# Patient Record
Sex: Female | Born: 1940 | Race: White | Hispanic: No | Smoking: Former smoker
Health system: Southern US, Community
[De-identification: ages and names within clinical notes are randomized; demographics above are authoritative.]

## PROBLEM LIST (undated history)

## (undated) DIAGNOSIS — K649 Unspecified hemorrhoids: Secondary | ICD-10-CM

## (undated) DIAGNOSIS — E119 Type 2 diabetes mellitus without complications: Secondary | ICD-10-CM

## (undated) DIAGNOSIS — J449 Chronic obstructive pulmonary disease, unspecified: Secondary | ICD-10-CM

## (undated) DIAGNOSIS — K219 Gastro-esophageal reflux disease without esophagitis: Secondary | ICD-10-CM

## (undated) DIAGNOSIS — I5032 Chronic diastolic (congestive) heart failure: Secondary | ICD-10-CM

## (undated) DIAGNOSIS — R531 Weakness: Secondary | ICD-10-CM

## (undated) DIAGNOSIS — K859 Acute pancreatitis without necrosis or infection, unspecified: Secondary | ICD-10-CM

## (undated) DIAGNOSIS — K589 Irritable bowel syndrome without diarrhea: Secondary | ICD-10-CM

## (undated) DIAGNOSIS — K7460 Unspecified cirrhosis of liver: Secondary | ICD-10-CM

## (undated) DIAGNOSIS — I509 Heart failure, unspecified: Secondary | ICD-10-CM

## (undated) DIAGNOSIS — K819 Cholecystitis, unspecified: Secondary | ICD-10-CM

## (undated) DIAGNOSIS — G459 Transient cerebral ischemic attack, unspecified: Secondary | ICD-10-CM

## (undated) DIAGNOSIS — I10 Essential (primary) hypertension: Secondary | ICD-10-CM

## (undated) DIAGNOSIS — H5440 Blindness, one eye, unspecified eye: Secondary | ICD-10-CM

## (undated) HISTORY — DX: Type 2 diabetes mellitus without complications: E11.9

## (undated) HISTORY — DX: Irritable bowel syndrome, unspecified: K58.9

## (undated) HISTORY — DX: Essential (primary) hypertension: I10

## (undated) HISTORY — DX: Blindness, one eye, unspecified eye: H54.40

---
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
## 2002-04-30 ENCOUNTER — Encounter: Payer: Self-pay | Admitting: Internal Medicine

## 2002-05-03 ENCOUNTER — Ambulatory Visit (HOSPITAL_COMMUNITY): Admission: RE | Admit: 2002-05-03 | Discharge: 2002-05-03 | Payer: Self-pay | Admitting: Internal Medicine

## 2002-06-18 ENCOUNTER — Ambulatory Visit: Admission: RE | Admit: 2002-06-18 | Discharge: 2002-06-18 | Payer: Self-pay | Admitting: *Deleted

## 2003-12-03 ENCOUNTER — Ambulatory Visit: Payer: Self-pay | Admitting: Family Medicine

## 2004-02-20 ENCOUNTER — Ambulatory Visit: Payer: Self-pay | Admitting: Family Medicine

## 2004-06-22 ENCOUNTER — Ambulatory Visit: Payer: Self-pay | Admitting: Family Medicine

## 2004-07-30 ENCOUNTER — Ambulatory Visit: Payer: Self-pay | Admitting: Family Medicine

## 2004-10-26 ENCOUNTER — Ambulatory Visit: Payer: Self-pay | Admitting: Family Medicine

## 2005-03-01 ENCOUNTER — Ambulatory Visit: Payer: Self-pay | Admitting: Family Medicine

## 2005-06-30 ENCOUNTER — Ambulatory Visit: Payer: Self-pay | Admitting: Family Medicine

## 2005-11-02 ENCOUNTER — Ambulatory Visit: Payer: Self-pay | Admitting: Family Medicine

## 2006-01-05 ENCOUNTER — Ambulatory Visit: Payer: Self-pay | Admitting: Family Medicine

## 2006-03-30 ENCOUNTER — Ambulatory Visit: Payer: Self-pay | Admitting: Family Medicine

## 2006-05-25 ENCOUNTER — Ambulatory Visit: Payer: Self-pay | Admitting: Family Medicine

## 2007-09-06 ENCOUNTER — Ambulatory Visit: Payer: Self-pay | Admitting: Internal Medicine

## 2007-09-07 ENCOUNTER — Ambulatory Visit (HOSPITAL_COMMUNITY): Admission: RE | Admit: 2007-09-07 | Discharge: 2007-09-07 | Payer: Self-pay | Admitting: Internal Medicine

## 2007-09-07 ENCOUNTER — Ambulatory Visit: Payer: Self-pay | Admitting: Internal Medicine

## 2007-09-07 HISTORY — PX: COLONOSCOPY: SHX174

## 2007-10-10 ENCOUNTER — Ambulatory Visit: Payer: Self-pay | Admitting: Gastroenterology

## 2008-05-03 ENCOUNTER — Encounter: Payer: Self-pay | Admitting: Gastroenterology

## 2008-05-08 DIAGNOSIS — I10 Essential (primary) hypertension: Secondary | ICD-10-CM

## 2008-05-08 DIAGNOSIS — G459 Transient cerebral ischemic attack, unspecified: Secondary | ICD-10-CM

## 2008-05-08 DIAGNOSIS — K623 Rectal prolapse: Secondary | ICD-10-CM

## 2008-05-08 DIAGNOSIS — Z860100 Personal history of colon polyps, unspecified: Secondary | ICD-10-CM | POA: Insufficient documentation

## 2008-05-08 DIAGNOSIS — K208 Other esophagitis without bleeding: Secondary | ICD-10-CM | POA: Insufficient documentation

## 2008-05-08 DIAGNOSIS — K219 Gastro-esophageal reflux disease without esophagitis: Secondary | ICD-10-CM

## 2008-05-08 DIAGNOSIS — Z8673 Personal history of transient ischemic attack (TIA), and cerebral infarction without residual deficits: Secondary | ICD-10-CM | POA: Insufficient documentation

## 2008-05-08 DIAGNOSIS — K648 Other hemorrhoids: Secondary | ICD-10-CM | POA: Insufficient documentation

## 2008-05-08 DIAGNOSIS — R159 Full incontinence of feces: Secondary | ICD-10-CM | POA: Insufficient documentation

## 2008-05-08 DIAGNOSIS — F17200 Nicotine dependence, unspecified, uncomplicated: Secondary | ICD-10-CM

## 2008-05-08 DIAGNOSIS — K644 Residual hemorrhoidal skin tags: Secondary | ICD-10-CM | POA: Insufficient documentation

## 2008-05-08 DIAGNOSIS — K449 Diaphragmatic hernia without obstruction or gangrene: Secondary | ICD-10-CM | POA: Insufficient documentation

## 2008-05-08 HISTORY — DX: Essential (primary) hypertension: I10

## 2008-05-08 HISTORY — DX: Transient cerebral ischemic attack, unspecified: G45.9

## 2008-05-09 ENCOUNTER — Ambulatory Visit: Payer: Self-pay | Admitting: Internal Medicine

## 2008-06-06 ENCOUNTER — Encounter: Payer: Self-pay | Admitting: Gastroenterology

## 2008-07-09 ENCOUNTER — Ambulatory Visit: Payer: Self-pay | Admitting: Internal Medicine

## 2009-07-18 ENCOUNTER — Encounter (INDEPENDENT_AMBULATORY_CARE_PROVIDER_SITE_OTHER): Payer: Self-pay

## 2009-07-24 ENCOUNTER — Ambulatory Visit: Payer: Self-pay | Admitting: Internal Medicine

## 2009-07-24 DIAGNOSIS — R152 Fecal urgency: Secondary | ICD-10-CM | POA: Insufficient documentation

## 2010-01-13 ENCOUNTER — Encounter (INDEPENDENT_AMBULATORY_CARE_PROVIDER_SITE_OTHER): Payer: Self-pay | Admitting: *Deleted

## 2010-02-05 NOTE — Letter (Signed)
Summary: Recall Office Visit  [facility]  [address]   [place], [place] [address]   Phone: [phone]  Fax: [phone]      July 18, 2009   Heather Oconnor [address] [place], [place]  [address] 1940-02-11   Dear Ms. Telford,   According to our records, it is time for you to schedule a follow-up office visit with [place].   At your convenience, please call [phone] to schedule an office visit. If you have any questions, concerns, or feel that this letter is in error, we would appreciate your call.   Sincerely,    Hendricks Limes LPN  [facility] Ph: [phone]   Fax: [phone]

## 2010-02-05 NOTE — Letter (Signed)
Summary: Recall Office Visit  [facility]  [address]   [place], [place] [address]   Phone: [phone]  Fax: [phone]      January 13, 2010   Heather Oconnor [address] [place], [place]  [address] 04-06-1940   Dear Ms. Howlett,   According to our records, it is time for you to schedule a follow-up office visit with [place].   At your convenience, please call [phone] to schedule an office visit. If you have any questions, concerns, or feel that this letter is in error, we would appreciate your call.   Sincerely,    Diana Eves  [facility] Ph: [phone]   Fax: [phone]

## 2010-02-05 NOTE — Assessment & Plan Note (Signed)
Summary: FECAL INCONTINENCE- CDG   Visit Type:  Initial Consult Referring Provider:  Nyland Primary Care Provider:  Nyland  Chief Complaint:  fecal incontinence.  History of Present Illness: 70 year old with intermittent rectal seepage/leakage. Has been on varying doses of cholestyramine as much as 4 g b.i.d. She states she stopped taking it entirely a couple weeks ago; symptoms improved and  fecal seepage is essentially stopped. She has known diverticulosis and hemorrhoids on olonoscopy. She is not passed any blood per rectum. She hasn't had any abdominal pain. She's quite pleased with the way she's doing at this time. She is not taking any form of fiber supplement.   Current Medications (verified): 1)  Lisinopril-Hydrochlorothiazide 20-12.5 Mg Tabs (Lisinopril-Hydrochlorothiazide) .... Two Times A Day 2)  Amlodipine Besylate 10 Mg Tabs (Amlodipine Besylate) .... Once Daily 3)  Plavix 75 Mg Tabs (Clopidogrel Bisulfate) .... Once Daily 4)  Metoprolol Tartrate 25 Mg Tabs (Metoprolol Tartrate) .... Once Daily 5)  Simvastatin 80 Mg Tabs (Simvastatin) .... Once Daily 6)  Aspirin 81 Mg Tabs (Aspirin) .... Once Daily  Allergies (verified): No Known Drug Allergies  Past History:  Past Surgical History: Last updated: 2008/05/14 none  Family History: Last updated: 05/14/08 Father:deceased 56-??  Mother:living 83-htn, breathing problems  Siblings: 4 brother 1 sister  Social History: Last updated: 05/14/08 Marital Status: Married Children: 3 Occupation: no Patient is a former smoker.  Alcohol Use - no  Risk Factors: Smoking Status: quit (14-May-2008)  Past Medical History: Hypertension  Vital Signs:  Patient profile:   70 year old female Height:      65 inches Weight:      190 pounds BMI:     31.73 Temp:     98.6 degrees F oral Pulse rate:   72 / minute BP sitting:   130 / 80  (left arm) Cuff size:   large  Vitals Entered By: Cloria Spring LPN (July 24, 2009 3:49  PM)  Physical Exam  General:  alert conversant pleasant lady in no acute distress  Detail exam deferred  Impression & Recommendations: Impression: Pleasant 70 year old lady with intermittent fecal seepage/leakage in a setting of rectal prolapse. Now much improved; off cholestyramine some 2 weeks ago. She has known diverticulosis and  not taking a fiber supplement.  Recommendations: I would favor a low dose fiber supplement in the way of psyllium supplements such as Metamucil (ie one half the dose daily for 2 weeks been increasing to one dose daily. She was admonished about the bloating symptoms early on in therapy. Will hold off on re- instituting cholestyramine at  this time.  We'll plan to see this nice lady back in 6 months. She has any relapse in her symptoms in the interim, she is to let me know.  Appended Document: Orders Update    Clinical Lists Changes  Problems: Added new problem of FECAL URGENCY (OZH-086.57) Orders: Added new Service order of Est. Patient Level II [id number]) - Signed      Appended Document: FECAL INCONTINENCE- CDG REMINDER IN COMPUTER

## 2010-05-19 NOTE — Op Note (Signed)
NAME:  Heather Oconnor, Heather Oconnor            ACCOUNT NO.:  [id number]   MEDICAL RECORD NO.:  [id number]          PATIENT TYPE:  AMB   LOCATION:  DAY                           FACILITY:  [facility]   PHYSICIAN:  R. Roetta Sessions, M.D. DATE OF BIRTH:  1940-05-28   DATE OF PROCEDURE:  DATE OF DISCHARGE:                               OPERATIVE REPORT   INDICATIONS FOR PROCEDURE:  A 70 year old lady with history of fecal  seepage daily and incontinence of seepage.  She denies diarrhea.  She  does have lax sphincter tone.  She has had some recent hematochezia.  Distant history of adenomatous polyps.  Colonoscopy is now being done.  Risks, benefits, alternatives, and limitations have been reviewed,  questions answered, and she is agreeable.  She has not taken the  [facility] Forte prescribed at our office as of yet.   PROCEDURE NOTE:  O2 saturation, blood pressure, pulse, and respirations  were monitored throughout the entire procedure.   CONSCIOUS SEDATION:  Versed 5 mg IV and Demerol 125 mg IV in divided  doses.   INSTRUMENT:  Pentax video chip system.   FINDINGS:  Digital rectal exam revealed external hemorrhoids and  sphincter tone.  She had some endoscopic findings.  Prep was adequate.  Colon:  Colonic mucosa was surveyed from the rectosigmoid junction  through the left transverse right colon to the appendiceal orifice,  ileocecal valve, and cecum.  These structures are well seen and  photographed for the record.  From this level, scope was slowly and  cautiously withdrawn.  All previously mentioned mucosal surfaces were  again seen.  The patient had a long redundant colon requiring changing  of the patient's position, external abdominal pressure, and cecum.  The  patient has scattered left-sided diverticula.  Colonic mucosa appeared  normal.  Scope was pulled down the rectum.  I attempted to retroflex,  but was unable to do so with __________, I was able to see the rectal  mucosa on phos well.   Rectal vault was relatively small.  Rectal mucosa  appeared normal aside from the anal canal and external hemorrhoids.  The  patient tolerated the procedure well and was reactive in Endoscopy.   IMPRESSION:  Anal canal/external hemorrhoids.  Long redundant colon and  left-sided diverticula.  The remainder of colonic mucosa appeared  normal.   RECOMMENDATIONS:  Go ahead and take the  [facility] Forte kit as  prescribed previously.  Daily Metamucil or Citrucel fiber supplements.  We will also add 2 g of Questran or cholestyramine to be taken not  within 2 hours of other medications daily.  Discussed the off label use  of this.  I will plan to see this nice lady back in the office in one  month to assess her progress.      Jonathon Bellows, M.D.  Electronically Signed     RMR/MEDQ  D:  09/07/2007  T:  09/08/2007  Job:  [id number]

## 2010-05-19 NOTE — Assessment & Plan Note (Signed)
NAMEMarland Kitchen  Heather Oconnor, Heather Oconnor             CHART#:  [id number]   DATE:  10/10/2007                       DOB:  January 20, 1940   CHIEF COMPLAINT:  Followup colonoscopy.   PROBLEM LIST:  1. Fecal seepage.  2. Rectal prolapse.  3. History of adenomatous polyps.  4. Colonoscopy by Dr. Jena Gauss for hematochezia and fecal seepage on      September 07, 2007, which showed internal or external and anal canal      hemorrhoids along with redundant colon left-sided diverticula.   SUBJECTIVE:  The patient is a 70 year old Caucasian female, who is here  for followup after her recent procedure.  She has done very well.  She  has used AnaMantle [facility] Forte b.i.d.  She occasionally has some  proctalgia.  She notes that her seepage is much better, now she is on  Prevalite 2 g daily.  She is taking this every day.  She denies any  rectal bleeding or melena.  Denies any constipation or diarrhea.  Denies  any abdominal pain.  Her stools are still somewhat loose.   CURRENT MEDICATIONS:  See the list from October 10, 2007.   ALLERGIES:  No known drug allergies are noted.   OBJECTIVE:  VITAL SIGNS:  Weight 182 pounds, height 65 inches,  temperature 97.9, blood pressure 132/80, and pulse 60.  GENERAL:  She is an obese Caucasian female, who is alert, oriented,  pleasant, and cooperative, in no acute distress.  HEENT:  Sclerae clear, nonicteric.  Conjunctivae pink.  Oropharynx pink  and moist without any lesions.  CHEST:  Heart, regular rate and rhythm.  Normal S1 and S2.  ABDOMEN:  Protuberant with positive bowel sounds x4.  No bruits  auscultated.  Soft, nontender, nondistended without palpable mass or  hepatosplenomegaly.  There is no rebound, tenderness, or guarding.   ASSESSMENT:  The patient is a 69 year old female with fecal seepage  which has responded to Prevalite 2 g daily.  She also has hemorrhoids  and we will attempt to treat them medically.  Again, if she has no  relief, she may need surgical evaluation.   She has diverticulosis and  she has history of adenomatous colon polyps.   PLAN:  1. Prevalite every day 2 g as needed for diarrhea or seepage, do not      take until 2 hours before or after your other medications.  2. Begin a probiotic of choice.  I have given her some names to choose      from including Sustenex, Align, FedEx, or Flora-Q.  3. Anusol HC suppositories one per rectum b.i.d. #20 with no refills.  4. If she is no better, she is going to give [place] a call, otherwise we      will see her back in 6 months.       Lorenza Burton, N.P.  Electronically Signed     R. Roetta Sessions, M.D.  Electronically Signed    KJ/MEDQ  D:  10/11/2007  T:  10/11/2007  Job:  [id number]   cc:   Delaney Meigs, M.D.

## 2010-05-19 NOTE — H&P (Signed)
NAME:  LAURISA, SAHAKIAN            ACCOUNT NO.:  [id number]   MEDICAL RECORD NO.:  [id number]          PATIENT TYPE:  AMB   LOCATION:  DAY                           FACILITY:  [facility]   PHYSICIAN:  R. Roetta Sessions, M.D. DATE OF BIRTH:  12-20-1940   DATE OF ADMISSION:  DATE OF DISCHARGE:  LH                              HISTORY & PHYSICAL   PRIMARY CARE PHYSICIAN:  Delaney Meigs, M.D.   REASON FOR CONSULTATION:  Fecal incontinence and hematochezia.   HISTORY OF PRESENT ILLNESS:  Ms. Armentrout is a 70 year old Caucasian  female.  She has history of a single adenomatous polyp and fecal  seepage.  She tells me that now she has been having fecal incontinence  all of the time.  She tells me she feels as though she cannot go  anywhere.  She denies any proctalgia or abdominal pain.  She tells me at  times she is not even aware that she has passed stool.  She does have  approximately one loose stool daily but then continues to have seepage  throughout the day.  She denies any abdominal pain.  She does note an  aggravating discomfort to her rectum without severe pain.  She has  noticed occasional small volume bright red blood with wiping on the  toilet paper.  She denies any history of constipation.  Her weight has  remained stable.  She denies any anorexia.  She occasionally has  heartburn/indigestion for which she takes Tums.  She denies any  dysphagia, odynophagia, nausea, vomiting.   PAST MEDICAL-SURGICAL HISTORY:  Hypertension, TIAs, GERD.  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  She was dilated with a 56 French Maloney dilator and she also  had nonerosive gastritis and Helicobacter pylori serology was negative  on last EGD May 03, 2002.  She had a history of diverticulosis in the  sigmoid colon and a single adenomatous polyp in the sigmoid which was  removed and was found to be a tubular adenoma as well as small external  hemorrhoids on last colonoscopy by Dr. Karilyn Cota May 03, 2002.   CURRENT MEDICATIONS:  1. Lisinopril 20/12.5 mg b.i.d.  2. Amlodipine 10 mg daily.  3. Plavix 75 mg daily.  4. Metoprolol 25 mg daily.  5. Simvastatin 80 mg daily.  6. Aspirin 81 mg daily.   ALLERGIES:  No known drug allergies.   FAMILY HISTORY:  There is no known family history of abnormal liver or  chronic GI problems.  Mother age 48 has hypertension.  Father deceased  at 11 secondary to CVA.  She has one sister with history of hypertension  and diabetes mellitus, four brothers with similar history.   SOCIAL HISTORY:  Ms. Ure has been married for 26 years.  She has  three grown healthy children.  She is retired.  She has a 50 pack year  history of tobacco use, quit about 2 years ago.  Denies any alcohol or  drug use.   REVIEW OF SYSTEMS:  See HPI, otherwise negative.   PHYSICAL EXAMINATION:  VITAL  SIGNS:  Weight 180 pounds, height 65  inches, temperature 98.2, blood pressure 160/80, pulse 64.  GENERAL:  Ms. Brozowski is a well-developed, well-nourished Caucasian  female in no acute distress.  She does appear somewhat anxious.  HEENT:  Sclerae anicteric.  Conjunctivae pink.  Oropharynx pink, moist  without any lesions.  NECK:  Supple without thyromegaly.  CHEST:  Heart regular rhythm, normal S1, S2.  No murmurs, clicks, rubs  or gallops.  LUNGS:  Clear to auscultation bilaterally.  ABDOMEN:  Protuberant with positive bowel sounds x4.  No bruits  auscultated.  Soft, nontender, nondistended without palpable mass, bruit  or organomegaly.  No rebound, tenderness or guarding.  RECTAL:  She does have mild rectal prolapse which is easily reducible.  She does have visible external hemorrhoids and poor sphincter tone.  There are no internal masses palpated.  EXTREMITIES:  Without clubbing or edema.   IMPRESSION:  Ms. Krizek is a 70 year old Caucasian female.  She has a  history of fecal seepage and now with daily fecal  incontinence/seepage.  She does have poor sphincter tone as well as mild, what appears to be a  mild rectal prolapse.  She also is noticing intermittent hematochezia.  Given her history of adenomatous polyps and her recent hematochezia, I  do feel she should undergo a complete colonoscopy as hers was over 5  years ago to rule out colorectal carcinoma.  It could be rectal prolapse  which is causing her to have this seepage as well as combined with poor  sphincter tone for her age.  She may need to be sent to a surgeon.   PLAN:  1. AnaMantle HC Forte one kit b.i.d. #20 kits no refills.  2. Colonoscopy with Dr. Jena Gauss in the near future.  Discussed the      procedure including the risks and benefits, which include but were      not limited to bleeding, infection, perforation, drug reaction,      informed consent will be obtained.      Lorenza Burton, N.P.      Jonathon Bellows, M.D.  Electronically Signed    KJ/MEDQ  D:  09/06/2007  T:  09/06/2007  Job:  [id number]   cc:   Delaney Meigs, M.D.  Fax: [phone]

## 2010-05-22 NOTE — Consult Note (Signed)
Heather Oconnor, Heather Oconnor                        ACCOUNT NO.:  [id number]   MEDICAL RECORD NO.:  [id number]                  PATIENT TYPE:   LOCATION:                                       FACILITY:   PHYSICIAN:  Lionel December, M.D.                 DATE OF BIRTH:  27-Jul-1940   DATE OF CONSULTATION:  04/17/2002  DATE OF DISCHARGE:                                   CONSULTATION   PHYSICIAN REQUESTING CONSULTATION:  Colon Flattery, M.D.   REASON FOR CONSULTATION:  Fecal incontinence.   HISTORY OF PRESENT ILLNESS:  The patient is a 70 year old Caucasian female  patient of Dr. Dewaine Conger who presents today for further evaluation of fecal  incontinence.  She says over the last six months she has had intermittent  episodes of fecal incontinence.  These always occur while she is at work.  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  She occasionally does some lifting.  She will go to use the  restroom and only then note that she has had a couple of tablespoons of  liquidy stool in her underwear.  Otherwise, she states her bowels move  generally once every day to every other day.  Her stool is usually soft and  formed unless she has one of these episodes of incontinence.  Denies any  melena or rectal bleeding.  She states that she has had trouble with  hemorrhoids.  They tend to swell and become painful.  Denies any abdominal  pain, nausea or vomiting, heartburn, weight loss.  She has never had a  colonoscopy.   CURRENT MEDICATIONS:  1. Lisinopril 10 mg daily.  2. Norvasc 10 mg daily.  3. Hydrochlorothiazide 25 mg 1/2 tablet daily.  4. Plavix 75 mg daily.  5. Zocor 20 mg daily.  6. Aspirin 81 mg daily.   ALLERGIES:  No known drug allergies.   PAST MEDICAL HISTORY:  1. Hypertension.  2. 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  3. Hypercholesterolemia.   PAST SURGICAL HISTORY:  None.   FAMILY HISTORY:  Negative for chronic GI illnesses or  colorectal cancer.   SOCIAL HISTORY:  She has been married for 20 years and has three children.  She is employed with [employer].  She smokes one pack of cigarettes  daily.  She has smoked since age 70.  Denies any alcohol use.   REVIEW OF SYSTEMS:  Please see HPI for GI.  GENERAL:  Denies any weight  loss.  CARDIOPULMONARY:  Denies any chest pain or shortness of breath.   PHYSICAL EXAMINATION:  VITAL SIGNS:  Height 5 feet 4 inches, weight 173  pounds, temperature 98.3 degrees, blood pressure 170/100, pulse 72.  GENERAL:  A pleasant, well-nourished, well-developed Caucasian female in no  acute distress.  SKIN:  Warm and dry.  No jaundice.  HEENT:  Conjunctivae are pink.  Sclerae  are nonicteric.  Oropharyngeal  mucosa moist and pink.  No lesions, erythema, or exudate.  No  lymphadenopathy or thyromegaly.  CHEST:  Lungs are clear to auscultation.  CARDIAC:  Regular rate and rhythm.  Normal S1, S2.  No murmurs, rubs, or  gallops.  ABDOMEN:  Positive bowel sounds.  Soft, nontender, nondistended.  No  organomegaly or masses  RECTAL:  Examination reveals a cluster of large hemorrhoids externally.  There is some excoriation.  The patient was examined by Dr. Karilyn Cota as well.  On internal exam, sphincter tone appears to be adequate.  With having the  patient bear down, there is no evidence of rectal prolapse.  No masses in  the rectal vault.  Stool is Hemoccult positive.  EXTREMITIES:  No edema.   LABORATORY DATA:  April 02, 2002:  BUN 8, creatinine 0.7, glucose 91, sodium  140, potassium 4, albumin 4.4, total bilirubin 0.6, alkaline phosphatase 66,  SGOT 15, SGPT 14.   IMPRESSION:  The patient is a 70 year old lady who has a six-month history  of intermittent fecal incontinence.  Tends to occur with standing for  prolonged periods of time.  Sphincter tone is adequate on examination.  I  suspect her symptoms are due to the several very large hemorrhoids.  I  suspect that she does have  some drainage due to these hemorrhoids.  On  examination, stool is Hemoccult positive.  This may be due to the excoriated  hemorrhoid noted on examination; however, the patient has never had a  colonoscopy and I do recommend one at this time to make sure that she has no  other abnormalities up stream.   The patient is hypertensive today.  She is quite anxious.   PLAN:  1. Colonoscopy in the near future.  2. FiberChoice two tablets daily.  3. Analpram-HC 2.5% cream apply anorectally b.i.d. for two weeks.     Prescription was provided for a two-week supply.  4. Further recommendations to follow.  5. I have asked the patient to monitor her blood pressures closely.  She can     have this checked at a pharmacy or [facility] if she doesn't have a blood     pressure cuff available to her.  If they remain elevated, she should     follow     up with Dr. Dewaine Conger.  Please note her blood pressure was 182/98 when she     saw Dr. Dewaine Conger a couple of weeks ago.   I would like to thank Dr. Dewaine Conger for allowing [place] to take part in the care of  this patient.     Tana Coast, P.A.                        Lionel December, M.D.    LL/MEDQ  D:  04/17/2002  T:  04/17/2002  Job:  [id number]   cc:   Colon Flattery, MD  [address]  [place]  [place] [address]  Fax: [phone]

## 2010-05-22 NOTE — Op Note (Signed)
NAME:  Heather Oconnor, Heather Oconnor                      ACCOUNT NO.:  [id number]   MEDICAL RECORD NO.:  [id number]                   PATIENT TYPE:  AMB   LOCATION:  DAY                                  FACILITY:  [facility]   PHYSICIAN:  Lionel December, M.D.                 DATE OF BIRTH:  12-30-1940   DATE OF PROCEDURE:  05/03/2002  DATE OF DISCHARGE:                                 OPERATIVE REPORT   PROCEDURE:  Esophagogastroduodenoscopy with esophageal dilatation followed  by total colonoscopy.   ENDOSCOPIST:  Lionel December, M.D.   INDICATIONS:  Ms. Heather Oconnor is a 70 year old Caucasian female with intermittent  solid food dysphagia who also has what she describes to be fecal  incontinence.  She notices liquid stool on her underpants.  Her rectal tone,  when she was seen in the office, was normal.  She does have central skin  tags. She is undergoing EGD and colonoscopy both for diagnostic purposes.  The procedure and risks were reviewed with the patient and informed consent  was obtained.   PREOPERATIVE MEDICATIONS:  Cetacaine spray for oropharyngeal topical  anesthesia, Demerol 50 mg IV and Versed 5 mg and Atropine 0.3 mg IV for  asymptomatic bradycardia.   INSTRUMENT:  Olympus video system.   EGD FINDINGS:  Procedure performed in endoscopy suite.  The patient's vital  signs and O2 saturation were monitored during the procedure and remained  stable.  The patient was placed in the left lateral recumbent position and  endoscope was passed via the oropharynx without any difficulty into the  esophagus.   ESOPHAGUS:  Mucosa of the esophagus was normal.  She had a singular erosion  at the GE junction.  The gastric mucosa around it was swollen.  There was a  small sliding hiatal hernia.  There was no ring or stricture that was  apparent.   STOMACH:  It was empty and distended very well with insufflation.  The folds  of the proximal stomach were normal.  Examination of the mucosa revealed  erythema and granularity at the antrum.  The angularis, fundus, and cardia  were normal.  Pyloric channel was patent.   DUODENUM:  Examination of the bulb and postbulbar duodenum was normal.  The  endoscope was withdrawn.   Esophageal dilation was performed by passing a 56 [place] Maloney dilator.  The esophagus was re-examined post dilatation.  There was no mucosal injury.  The endoscope was withdrawn and the patient was prepared for procedure #2.   COLONOSCOPY:  Rectal examination was performed.  Soft, central skin tags  were noted.  A digital exam was normal.   The scope was placed in the rectum and advanced under vision into the  sigmoid colon.  Preparation was satisfactory.  The scope was passed to the  cecum which was identified by ileocecal valve and appendiceal orifice.  Pictures were taken for the record.  As the scope was withdrawn  the colonic  mucosa was carefully examined.  There was a single small polyp at the  midsigmoid colon which was ablated by cold biopsy.  There were a couple  small diverticula in this area.  The rectal mucosa was normal.   The scope was retroflexed to examine the anorectal junction and hemorrhoids  were noted below the dentate line.  The endoscope was straightened and  withdrawn.  The patient tolerated the procedure well.   FINAL DIAGNOSES:  1. Erosive reflux esophagitis with small sliding hiatal hernia with no     evidence of ring or stricture formation.  Esophagogastroduodenoscopy     performed by passing 56 [place] Maloney dilator.  2. Nonerosive antral gastritis possibly related to ASA use, but will make     sure she does not have Helicobacter pylori infection.  3. A small polyp ablated by cold biopsy from the sigmoid colon.  4. A small diverticula at sigmoid colon.  5. External hemorrhoids.   RECOMMENDATIONS:  1. Antireflux measures.  2. Prilosec 20 mg p.o. q.a.m. (OTC).  3. Citrucel 1 tablespoonful daily at night.  4. H. pylori serology  will be checked today.  I will contact patient with     results of blood test and biopsy.  I have asked her to keep a record of     these episodes of fecal seepage.  5. She will return for OV 6 weeks from now.                                               Lionel December, M.D.    NR/MEDQ  D:  05/03/2002  T:  05/03/2002  Job:  [id number]   cc:   Colon Flattery, MD  [address]  [place]  [place] [address]  Fax: [phone]

## 2013-09-11 DIAGNOSIS — E785 Hyperlipidemia, unspecified: Secondary | ICD-10-CM | POA: Insufficient documentation

## 2013-09-11 DIAGNOSIS — E782 Mixed hyperlipidemia: Secondary | ICD-10-CM | POA: Insufficient documentation

## 2013-09-11 DIAGNOSIS — E1169 Type 2 diabetes mellitus with other specified complication: Secondary | ICD-10-CM | POA: Insufficient documentation

## 2014-11-13 ENCOUNTER — Encounter: Payer: Self-pay | Admitting: Internal Medicine

## 2014-11-27 ENCOUNTER — Ambulatory Visit (INDEPENDENT_AMBULATORY_CARE_PROVIDER_SITE_OTHER): Payer: Medicare HMO | Admitting: Nurse Practitioner

## 2014-11-27 ENCOUNTER — Encounter: Payer: Self-pay | Admitting: Nurse Practitioner

## 2014-11-27 VITALS — BP 106/72 | HR 80 | Temp 97.4°F | Ht 64.0 in | Wt 182.8 lb

## 2014-11-27 DIAGNOSIS — R1084 Generalized abdominal pain: Secondary | ICD-10-CM

## 2014-11-27 DIAGNOSIS — R109 Unspecified abdominal pain: Secondary | ICD-10-CM | POA: Insufficient documentation

## 2014-11-27 DIAGNOSIS — R197 Diarrhea, unspecified: Secondary | ICD-10-CM

## 2014-11-27 NOTE — Assessment & Plan Note (Signed)
Diarrhea and abdominal pain in a pattern consistent with IBS-D. Viberzi and follow-up as per above. Also with skin irritation and redness due to frequent stools and needing to wear a pad. Recommend OTC hydrocortisone prn. Follow-up in 6-8 weeks.

## 2014-11-27 NOTE — Assessment & Plan Note (Signed)
Patient with abdominal pain and diarrhea with abdominal tenderness resolving after bowel movement. She tends to have urgency and bowel movement after each meal. Has eliminated dairy from her diet with no improvement. 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 We will trial her on Viberzi 100 mg twice daily with food. She does not drink any alcohol, gallbladder remains in situ. Discussed side effects including to notify us if any severe abdominal pain. She will call some 2 weeks with the results of the medication. Return for follow-up in 6-8 weeks.

## 2014-11-27 NOTE — Patient Instructions (Signed)
1. Start taking Viberzi 100 mg. Take it twice a day with food. Exline call us if you have any severe abdominal pain. 2. Do not drink any more and 2 drinks a day of alcohol all taking this medication. 3. Call in 2 weeks and let me know how is working for you. If is working well and consented a prescription. 4. Return for follow-up in 6-8 weeks. 5. He can take over-the-counter hydrocortisone cream for the irritation we discussed.

## 2014-11-27 NOTE — Progress Notes (Signed)
Primary Care Physician:  Josue Hector, MD Primary Gastroenterologist:  Dr. Jena Gauss   Chief Complaint  Patient presents with  . Diarrhea  . Abdominal Pain    HPI:   74 year old female presents for evaluation of diarrhea and abdominal pain. Last seen by our office in 2009. PCP notes reviewed. Per PCP notes at last visit 11/05/2014 patient was having several weeks of diarrhea with no fever or chills, GERD after every meal, denies a lot of dairy. Was recommended to eliminate dairy from her foods, use Imodium hour before eating, and follow-up with [place]. EGD and colonoscopy last completed 09/07/2007 which found anal canal/external hemorrhoids, redundant colon, left-sided diverticula, otherwise normal colonic mucosa.  Today she states she has diarrhea postprandially and with urgency, stools are loose. States it's been this way "for years" but has gotten worse as of 1 year ago. Wears pads. Previously was intermittent, now is with every meal. Also with abdominal pain which is relieved with bowel movement. Has 1-3 daily bowel movements, thinks she doesn't finish because she will have recurrent stools soon after. Denies hematochezia, fever, chills, unintentional weight loss. Denies chest pain, dyspnea, dizziness, lightheadedness, syncope, near syncope. Denies any other upper or lower GI symptoms.  No past medical history on file.  No past surgical history on file.  Current Outpatient Prescriptions  Medication Sig Dispense Refill  . amLODipine (NORVASC) 10 MG tablet TAKE 1 TABLET EVERY DAY    . aspirin 81 MG chewable tablet Chew 81 mg by mouth.    . canagliflozin (INVOKANA) 300 MG TABS tablet TAKE ONE (1) TABLET EACH DAY    . clopidogrel (PLAVIX) 75 MG tablet TAKE 1 TABLET EVERY DAY    . lisinopril-hydrochlorothiazide (PRINZIDE,ZESTORETIC) 20-12.5 MG tablet TAKE 2 TABLETS EVERY DAY    . metoprolol tartrate (LOPRESSOR) 25 MG tablet TAKE 1 TABLET EVERY DAY    . glipiZIDE (GLUCOTROL XL) 2.5 MG 24  hr tablet Take 2.5 mg by mouth daily with breakfast.    . meclizine (ANTIVERT) 25 MG tablet Take 25 mg by mouth.     No current facility-administered medications for this visit.    Allergies as of 11/27/2014  . (No Known Allergies)    No family history on file.  Social History   Social History  . Marital Status: Married    Spouse Name: N/A  . Number of Children: N/A  . Years of Education: N/A   Occupational History  . Not on file.   Social History Main Topics  . Smoking status: Former Smoker -- 9 years    Quit date: 11/27/2006  . Smokeless tobacco: Not on file  . Alcohol Use: Not on file  . Drug Use: Not on file  . Sexual Activity: Not on file   Other Topics Concern  . Not on file   Social History Narrative  . No narrative on file    Review of Systems: General: Negative for anorexia, weight loss, fever, chills, fatigue, weakness. Eyes: Negative for vision changes.  ENT: Negative for hoarseness, difficulty swallowing. CV: Negative for chest pain, angina, palpitations, peripheral edema.  Respiratory: Negative for dyspnea at rest, cough, sputum, wheezing.  GI: See history of present illness. Derm: Negative for rash or itching.  Endo: Negative for unusual weight change.  Heme: Negative for bruising or bleeding. Allergy: Negative for rash or hives.    Physical Exam: BP 106/72 mmHg  Pulse 80  Temp(Src) 97.4 F (36.3 C)  Ht  (1.626 m)  Wt 182 lb  12.8 oz (82.918 kg)  BMI 31.36 kg/m2 General:   Alert and oriented. Pleasant and cooperative. Well-nourished and well-developed.  Head:  Normocephalic and atraumatic. Eyes:  Without icterus, sclera clear and conjunctiva pink.  Ears:  Normal auditory acuity. Cardiovascular:  S1, S2 present without murmurs appreciated. Extremities without clubbing or edema. Respiratory:  Clear to auscultation bilaterally. No wheezes, rales, or rhonchi. No distress.  Gastrointestinal:  +BS, soft, and non-distended. Minimal TTP  generalized abdomen.  No HSM noted. No guarding or rebound. No masses appreciated.  Rectal:  Deferred  Skin:  Intact without significant lesions or rashes. Neurologic:  Alert and oriented x4;  grossly normal neurologically. Psych:  Alert and cooperative. Normal mood and affect. Heme/Lymph/Immune: No excessive bruising noted.    11/27/2014 10:33 AM

## 2014-12-02 ENCOUNTER — Telehealth: Payer: Self-pay | Admitting: Internal Medicine

## 2014-12-02 NOTE — Telephone Encounter (Signed)
Spoke with the pt- she has not had a bm in about 3-4 days. Her last bm was just a very small amount.  She said the medication was working too well and she went from one extreme to the other. Small amount of cramping in her abd. No fever, no blood, no vomiting. She does have some nausea. She wants to know if she can take it once a day instead of twice to see if that helps.

## 2014-12-02 NOTE — Progress Notes (Signed)
cc'ed to pcp °

## 2014-12-02 NOTE — Telephone Encounter (Signed)
Pt was seen recently by EG and called this morning to say that since she started taking viberzi she has not had a BM since. Please call [phone]

## 2014-12-03 ENCOUNTER — Telehealth: Payer: Self-pay | Admitting: Internal Medicine

## 2014-12-03 NOTE — Telephone Encounter (Signed)
Patient called to say that she has called several times and no one has gotten back with her about her medicine. I told her that JL had spoke with her yesterday and she was waiting to hear back from EG. EG has been seeing patients this morning and when he gets back with JL she would call her back. Patient is wanting to know something today. Please call patient back

## 2014-12-03 NOTE — Telephone Encounter (Signed)
Yes, take it once a day and see fi that improves her symptoms. It may have been too much at the bid dosing. Call us in about a week and let us know how she's doing.

## 2014-12-03 NOTE — Telephone Encounter (Signed)
Routing to EG 

## 2014-12-03 NOTE — Telephone Encounter (Signed)
Pt is aware.  

## 2015-01-10 ENCOUNTER — Telehealth: Payer: Self-pay | Admitting: Internal Medicine

## 2015-01-10 MED ORDER — ELUXADOLINE 100 MG PO TABS: 100.0000 mg | ORAL_TABLET | Freq: Two times a day (BID) | ORAL | Status: DC

## 2015-01-10 NOTE — Telephone Encounter (Signed)
RX done. 

## 2015-01-10 NOTE — Telephone Encounter (Signed)
[phone] VIBERZI SAMPLES ERIC GAVE HER ARE WORKING AND SHE WANTS THE 100'S CALLED INTO [facility]  GOING TO TAKE TWICE A DAY

## 2015-01-10 NOTE — Addendum Note (Signed)
Addended by: Tiffany KocherLEWIS, Lashauna Arpin S on: 01/10/2015 11:12 AM   Modules accepted: Orders

## 2015-01-10 NOTE — Telephone Encounter (Signed)
Routing to the refill box. 

## 2015-01-13 ENCOUNTER — Telehealth: Payer: Self-pay | Admitting: Internal Medicine

## 2015-01-13 NOTE — Telephone Encounter (Signed)
Pt is out of her Viberzi and was told by pharmacy that they need PA before they can fill it. Do we have anything from her pharmacy about this and do we have samples to hold her until she can get prescription filled. She cancelled her OV with [facility] for tomorrow due to weather. Please call [phone]

## 2015-01-13 NOTE — Telephone Encounter (Signed)
I will put the sample up front for her. Told her that Raynelle FanningJulie was out today.

## 2015-01-14 ENCOUNTER — Ambulatory Visit: Payer: Medicare HMO | Admitting: Nurse Practitioner

## 2015-01-14 NOTE — Telephone Encounter (Signed)
Working on PA

## 2015-01-15 NOTE — Telephone Encounter (Signed)
PA has been sent to the insurance company. 

## 2015-01-27 ENCOUNTER — Ambulatory Visit: Payer: Medicare HMO | Admitting: Nurse Practitioner

## 2015-02-04 ENCOUNTER — Ambulatory Visit (INDEPENDENT_AMBULATORY_CARE_PROVIDER_SITE_OTHER): Payer: Medicare HMO | Admitting: Nurse Practitioner

## 2015-02-04 ENCOUNTER — Encounter: Payer: Self-pay | Admitting: Nurse Practitioner

## 2015-02-04 VITALS — BP 131/69 | HR 64 | Temp 97.5°F | Ht 64.0 in | Wt 181.4 lb

## 2015-02-04 DIAGNOSIS — K589 Irritable bowel syndrome without diarrhea: Secondary | ICD-10-CM | POA: Insufficient documentation

## 2015-02-04 DIAGNOSIS — K580 Irritable bowel syndrome with diarrhea: Secondary | ICD-10-CM

## 2015-02-04 MED ORDER — DICYCLOMINE HCL 10 MG PO CAPS: 10.0000 mg | ORAL_CAPSULE | Freq: Three times a day (TID) | ORAL | Status: DC | PRN

## 2015-02-04 NOTE — Patient Instructions (Signed)
1. I send in a prescription for Bentyl to your pharmacy. 2. Take it once a day in the morning before breakfast. You can add a dose before lunch and before dinner as needed to prevent your symptoms. 3. Call in a few weeks and let us know if it is working. 4. Return for follow-up in 3 months.

## 2015-02-04 NOTE — Progress Notes (Signed)
Referring Provider: Dione Housekeeper, MD Primary Care Physician:  Sherrie Mustache, MD Primary GI:  Dr. Gala Romney  Chief Complaint  Patient presents with  . Abdominal Pain  . Follow-up    HPI:   75 year old female presents for follow-up on abdominal pain and diarrhea. Last seen in our office 11/27/2014 with abdominal pain relieved by bowel movement, urgency after every meal. Eliminated dairy from her diet with no improvement. Diagnosed with irritable bowel diarrhea type because she met) criteria. She is trialed on Viberzi 100 mg twice a day. Colonoscopy up-to-date last completed 09/07/2007.  Today she states she became constipated on Viberzi twice daily, cut back to one and it was effective with an occasional breakthrough. Leakage stopped as well. Abdominal pain improved on Viberzi as well. However, Viberzi will cost $500/month and copay card not applicable to Medicare. Denies N/V, hematochezia, melena, unintentional weight loss. Denies chest pain, dyspnea, dizziness, lightheadedness, syncope, near syncope. Denies any other upper or lower GI symptoms.   Past Medical History  Diagnosis Date  . IBS (irritable bowel syndrome)     No past surgical history on file.  Current Outpatient Prescriptions  Medication Sig Dispense Refill  . amLODipine (NORVASC) 10 MG tablet TAKE 1 TABLET EVERY DAY    . aspirin 81 MG chewable tablet Chew 81 mg by mouth.    . canagliflozin (INVOKANA) 300 MG TABS tablet TAKE ONE (1) TABLET EACH DAY    . clopidogrel (PLAVIX) 75 MG tablet TAKE 1 TABLET EVERY DAY    . Eluxadoline (VIBERZI) 100 MG TABS Take 100 mg by mouth 2 (two) times daily with a meal. 60 tablet 5  . lisinopril-hydrochlorothiazide (PRINZIDE,ZESTORETIC) 20-12.5 MG tablet TAKE 2 TABLETS EVERY DAY    . metoprolol tartrate (LOPRESSOR) 25 MG tablet TAKE 1 TABLET EVERY DAY    . dicyclomine (BENTYL) 10 MG capsule Take 1 capsule (10 mg total) by mouth 3 (three) times daily with meals as needed for  spasms. 30 capsule 1   No current facility-administered medications for this visit.    Allergies as of 02/04/2015  . (No Known Allergies)    Family History  Problem Relation Age of Onset  . Colon cancer Neg Hx     Social History   Social History  . Marital Status: Married    Spouse Name: N/A  . Number of Children: N/A  . Years of Education: N/A   Social History Main Topics  . Smoking status: Former Smoker -- 9 years    Quit date: 11/27/2006  . Smokeless tobacco: Never Used  . Alcohol Use: No  . Drug Use: No  . Sexual Activity: Not Asked   Other Topics Concern  . None   Social History Narrative    Review of Systems: General: Negative for anorexia, weight loss, fever, chills, fatigue, weakness. ENT: Negative for hoarseness, difficulty swallowing. CV: Negative for chest pain, angina, palpitations, peripheral edema.  Respiratory: Negative for dyspnea at rest, cough, sputum, wheezing.  GI: See history of present illness. Endo: Negative for unusual weight change.  Heme: Negative for bruising or bleeding.   Physical Exam: BP 131/69 mmHg  Pulse 64  Temp(Src) 97.5 F (36.4 C)  Ht _0  (1.626 m)  Wt 181 lb 6.4 oz (82.283 kg)  BMI 31.12 kg/m2 General:   Alert and oriented. Pleasant and cooperative. Well-nourished and well-developed.  Head:  Normocephalic and atraumatic. Cardiovascular:  S1, S2 present without murmurs appreciated. Extremities without clubbing or edema. Respiratory:  Clear to auscultation bilaterally. No  wheezes, rales, or rhonchi. No distress.  Gastrointestinal:  +BS, soft, non-tender and non-distended. No HSM noted. No guarding or rebound. No masses appreciated.  Rectal:  Deferred  Neurologic:  Alert and oriented x4;  grossly normal neurologically. Psych:  Alert and cooperative. Normal mood and affect.    02/04/2015 12:43 PM

## 2015-02-04 NOTE — Assessment & Plan Note (Signed)
Patient with likely irritable bowel syndrome given her symptoms and presentation. She was started on Viberzi at her last office visit. Twice a day dosing was overshoot and caused constipation. Once a day dosing was effective. However, her prescription would cost her $500 and she cannot afford this. She is asking to start on different medication. At this time we will trial her on Bentyl 10 mg 3 times a day as needed. Return for follow-up in 3 months. She is to call us in 2-3 weeks and let us know how Bentyl is working for her. If it is ineffective we can attempt another medication.

## 2015-02-05 NOTE — Progress Notes (Signed)
cc'ed to pcp °

## 2015-02-12 ENCOUNTER — Telehealth: Payer: Self-pay | Admitting: Internal Medicine

## 2015-02-12 DIAGNOSIS — K580 Irritable bowel syndrome with diarrhea: Secondary | ICD-10-CM

## 2015-02-12 MED ORDER — DICYCLOMINE HCL 20 MG PO TABS: 20.0000 mg | ORAL_TABLET | Freq: Three times a day (TID) | ORAL | Status: DC

## 2015-02-12 NOTE — Telephone Encounter (Signed)
Pt called to say that she has been taking dicyclomine three times a day and it isn't helping and she needs something stronger. Please advise and call her at [phone]

## 2015-02-12 NOTE — Addendum Note (Signed)
Addended by: Delane Ginger, ERIC A on: 02/12/2015 02:27 PM   Modules accepted: Orders

## 2015-02-12 NOTE — Telephone Encounter (Signed)
Pt is aware.  

## 2015-02-12 NOTE — Telephone Encounter (Signed)
What strength of dicyclomine has she been taking?

## 2015-02-12 NOTE — Telephone Encounter (Signed)
10 mg tid

## 2015-02-12 NOTE — Telephone Encounter (Signed)
Called and spoke with the pt- she couldn't afford viberzi, has been taking dicyclomine tid and still having loose stools especially in the morning when she wakes up. She wants to know if there is anything stronger she can take?

## 2015-02-12 NOTE — Telephone Encounter (Signed)
Please notify her I sent in an Rx for Bentyl 20 mg (increased dose) 3 times a day and at bedtime as needed for diarrhea. She can take two of the  pills she has now to use those up and then pick up the Rx for the  tabs.

## 2015-03-23 ENCOUNTER — Other Ambulatory Visit: Payer: Self-pay | Admitting: Nurse Practitioner

## 2015-05-05 ENCOUNTER — Ambulatory Visit (INDEPENDENT_AMBULATORY_CARE_PROVIDER_SITE_OTHER): Payer: Medicare HMO | Admitting: Nurse Practitioner

## 2015-05-05 ENCOUNTER — Telehealth: Payer: Self-pay | Admitting: Internal Medicine

## 2015-05-05 ENCOUNTER — Encounter: Payer: Self-pay | Admitting: Nurse Practitioner

## 2015-05-05 VITALS — BP 131/81 | HR 71 | Temp 98.1°F | Ht 65.0 in | Wt 177.6 lb

## 2015-05-05 DIAGNOSIS — K580 Irritable bowel syndrome with diarrhea: Secondary | ICD-10-CM | POA: Diagnosis not present

## 2015-05-05 MED ORDER — HYOSCYAMINE SULFATE 0.125 MG SL SUBL: 0.1250 mg | SUBLINGUAL_TABLET | SUBLINGUAL | Status: DC | PRN

## 2015-05-05 NOTE — Assessment & Plan Note (Addendum)
Continues with approximately 3 soft stools a day which hit with urgency and unpredictability. Viberzi worked very well for her but it is too expensive on Medicare with the donut hole. Bentyl not as effective. At this point I will have her stop Bentyl and start Levsin to see if it improves her symptoms. Return for follow-up in 6-8 weeks. Call with progress report in 2 weeks.

## 2015-05-05 NOTE — Progress Notes (Signed)
Referring Joette Catchingland, Leonard, MD Primary Care Physician:  Josue Hector, MD Primary GI:  Dr. Jena Gauss  Chief Complaint  Patient presents with  . Irritable Bowel Syndrome    HPI:   Heather Oconnor is a 75 y.o. female who presents for follow-up on irritable bowel syndrome diarrhea type. She was last seen in our office 02/04/2015 at which point she became constipated on Viberzi twice a day, cutting back to once a day and was effective however was high cost with Medicare. At that point she was started on Bentyl 10 mg 3 times a day and requested to call with a progress report. She called shortly thereafter saying he was not very effective and her dose was increased to 20 mg 3 times a day and at bedtime. Colonoscopy up-to-date last completed in 2009.  Today she states the Bentyl 20 mg dose isn't working much better. Viberzi worked really well but it's too expensive. Still has a gallbladder. Stools are soft consistent with [place] 5-6, occasionally loose. Typically has 3 bowel movements a day in rapid succession with need to defecate 15 mins after her first bowel movement and then one recurrence later in the day, but hits her at random which is affecting her quality of life. Denies hematochezia, melena. Occasional abdominal pain when she has to go, improves after bowel movement. Denies any other upper or lower GI symptoms.  Past Medical History  Diagnosis Date  . IBS (irritable bowel syndrome)     Past Surgical History  Procedure Laterality Date  . Colonoscopy  09/07/2007    RMR: anal canal/external hemorrhoids, redundant colon, left-sided diverticula, otherwise normal colonic mucosa    Current Outpatient Prescriptions  Medication Sig Dispense Refill  . amLODipine (NORVASC) 10 MG tablet TAKE 1 TABLET EVERY DAY    . aspirin 81 MG chewable tablet Chew 81 mg by mouth.    . canagliflozin (INVOKANA) 300 MG TABS tablet TAKE ONE (1) TABLET EACH DAY    . clopidogrel (PLAVIX) 75 MG  tablet TAKE 1 TABLET EVERY DAY    . dicyclomine (BENTYL) 20 MG tablet TAKE ONE TABLET FOUR TIMES A DAY BEFORE MEALS AND AT BEDTIME 120 tablet 3  . lisinopril-hydrochlorothiazide (PRINZIDE,ZESTORETIC) 20-12.5 MG tablet TAKE 2 TABLETS EVERY DAY    . metoprolol tartrate (LOPRESSOR) 25 MG tablet TAKE 1 TABLET EVERY DAY    . Eluxadoline (VIBERZI) 100 MG TABS Take 100 mg by mouth 2 (two) times daily with a meal. (Patient not taking: Reported on 05/05/2015) 60 tablet 5   No current facility-administered medications for this visit.    Allergies as of 05/05/2015  . (No Known Allergies)    Family History  Problem Relation Age of Onset  . Colon cancer Neg Hx     Social History   Social History  . Marital Status: Married    Spouse Name: N/A  . Number of Children: N/A  . Years of Education: N/A   Social History Main Topics  . Smoking status: Former Smoker -- 9 years    Quit date: 11/27/2006  . Smokeless tobacco: Never Used  . Alcohol Use: No  . Drug Use: No  . Sexual Activity: Not Asked   Other Topics Concern  . None   Social History Narrative    Review of Systems: General: Negative for anorexia, weight loss, fever, chills, fatigue, weakness. ENT: Negative for hoarseness, difficulty swallowing. CV: Negative for chest pain, angina, palpitations, peripheral edema.  Respiratory: Negative for dyspnea at rest, cough, sputum,  wheezing.  GI: See history of present illness. Endo: Negative for unusual weight change.    Physical Exam: BP 131/81 mmHg  Pulse 71  Temp(Src) 98.1 F (36.7 C) (Oral)  Ht 5\' 5"  (1.651 m)  Wt 177 lb 9.6 oz (80.559 kg)  BMI 29.55 kg/m2 General:   Alert and oriented. Pleasant and cooperative. Well-nourished and well-developed.  Head:  Normocephalic and atraumatic. Eyes:  Without icterus, sclera clear and conjunctiva pink.  Ears:  Normal auditory acuity. Cardiovascular:  S1, S2 present without murmurs appreciated. Extremities without clubbing or  edema. Respiratory:  Clear to auscultation bilaterally. No wheezes, rales, or rhonchi. No distress.  Gastrointestinal:  +BS, rounded but soft, non-tender and non-distended. No guarding or rebound. No masses appreciated.  Rectal:  Deferred  Musculoskalatal:  Symmetrical without gross deformities. Neurologic:  Alert and oriented x4;  grossly normal neurologically. Psych:  Alert and cooperative. Normal mood and affect. Heme/Lymph/Immune: No excessive bruising noted.    05/05/2015 9:58 AM   Disclaimer: This note was dictated with voice recognition software. Similar sounding words can inadvertently be transcribed and may not be corrected upon review.

## 2015-05-05 NOTE — Telephone Encounter (Signed)
I spoke with the pt, informed her that I just got PA request from the pharmacy and that it would take a couple of days before they would give us an answer. And when they did, I would inform the pharmacy.

## 2015-05-05 NOTE — Progress Notes (Signed)
cc'ed to pcp °

## 2015-05-05 NOTE — Patient Instructions (Signed)
1. Stop taking Bentyl. 2. Start taking Levsin, as needed up to every 4 hours. 3. Call us with a progress report after 2 weeks on the Levsin to see how it is doing. 4. Return for follow-up in 6-8 weeks.

## 2015-05-05 NOTE — Telephone Encounter (Signed)
[phone] PATIENT STATED THAT THE MEDICINE ERIC GILL CALLED IN TODAY IS NOT COVERED BY HER INSURANCE  PLEASE ADVISE

## 2015-05-08 NOTE — Telephone Encounter (Signed)
Received a fax from the insurance company, hyoscyamine has been denied. [employer] wants her to try loperamide before they will approve hyoscyamine or it will need an appeal letter.

## 2015-05-12 NOTE — Telephone Encounter (Signed)
Defer to Heather Oconnor since he just saw the patient.

## 2015-05-13 NOTE — Telephone Encounter (Addendum)
Tell her the insurance company won't cover the medication and unless she tries Immodium first. Ask her if she's tried this. If she has, we can appeal. If not, try it for a week or two per bottle instructions and if no improvement call and let us know.

## 2015-05-13 NOTE — Telephone Encounter (Deleted)
Please notify the patient her insurance stated they will not cover the medication I prescribed unless we try another one first (Hyoscyamine). I will send in an Rx to the pharmacy for 30 days worth. Try and and if no improvement in a couple weeks, call back and we can appeal the other medication.

## 2015-05-13 NOTE — Telephone Encounter (Signed)
Routing to EG, can you do an appeal letter

## 2015-05-14 ENCOUNTER — Telehealth: Payer: Self-pay | Admitting: Internal Medicine

## 2015-05-14 NOTE — Telephone Encounter (Signed)
Pt is going to try immodium per EG recommendations with other phone note.

## 2015-05-14 NOTE — Telephone Encounter (Signed)
Noted, thanks!

## 2015-05-14 NOTE — Telephone Encounter (Signed)
Tried to call pt- NA, no voicemail 

## 2015-05-14 NOTE — Telephone Encounter (Signed)
PATIENT WAS GIVEN HYOSCYAMINE  AT LAST VISIT AND IT IS NOT WORKING FOR HER DIARRHEA, THE FIRST MEDICINE SHE WAS GIVEN, VIBERZI, WORKED BUT IT WAS TOO EXPENSIVE.  WHAT ELSE CAN SHE TAKE.  PLEASE ADVISE  [phone]

## 2015-05-14 NOTE — Telephone Encounter (Signed)
I spoke with the pt, she paid cash for the medication and it is not helping her at all and she feels like she may be having some side effects from it and prefers not to take it again. She has not tried immodium and is willing to try it. She will go today and buy some. She is going to start it tomorrow. Routing to EG for [employer]

## 2015-05-19 ENCOUNTER — Encounter: Payer: Self-pay | Admitting: Internal Medicine

## 2017-01-31 ENCOUNTER — Encounter: Payer: Self-pay | Admitting: Internal Medicine

## 2017-03-08 ENCOUNTER — Ambulatory Visit: Payer: Medicare HMO | Admitting: Internal Medicine

## 2017-03-08 ENCOUNTER — Encounter: Payer: Self-pay | Admitting: Internal Medicine

## 2017-03-08 VITALS — BP 183/86 | HR 79 | Temp 97.4°F | Ht 65.0 in | Wt 188.2 lb

## 2017-03-08 DIAGNOSIS — R197 Diarrhea, unspecified: Secondary | ICD-10-CM

## 2017-03-08 NOTE — Patient Instructions (Signed)
Resume Viberzi 100mg  once daily - sample pack for 16 days supplied  Information on kegal execercises given  Please call in 2 weeks and let us know how you are doing   Office visit in 3 months

## 2017-03-08 NOTE — Progress Notes (Signed)
Primary Care Physician:  Joette Catching, MD Primary Gastroenterologist:  Dr. Jena Gauss  Pre-Procedure History & Physical: HPI:  Heather Oconnor is a 77 y.o. female here for recurrent intermittent diarrhea and fecal seepage/episodes of incontinence. Ongoing issues. Did not like Questran in the past. Viberzi worked greaqt previously but could not afford it. Patient did not fill the Bentyl or Levsin helped as well. Wants something done. Willing to try anything.  She is not having any rectal bleeding. 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  She's had a couple colonoscopies in the past. No biopsies were microscopic colitis as far as I can tell. Past Medical History:  Diagnosis Date  . Diabetes (HCC)   . Hypertension   . IBS (irritable bowel syndrome)       Prior to Admission medications   Medication Sig Start Date End Date Taking? Authorizing Provider  amLODipine (NORVASC) 10 MG tablet TAKE 1 TABLET EVERY DAY 07/11/14  Yes [provider]  aspirin 81 MG chewable tablet Chew 81 mg by mouth.   Yes [provider]  brimonidine (ALPHAGAN) 0.2 % ophthalmic solution 2 (two) times daily.    Yes [provider]  Budesonide (RHINOCORT ALLERGY NA) Place into the nose as needed.   Yes [provider]  clopidogrel (PLAVIX) 75 MG tablet TAKE 1 TABLET EVERY DAY 07/11/14  Yes [provider]  DORZOLAMIDE HCL-TIMOLOL MAL OP Apply to eye.   Yes [provider]  latanoprost (XALATAN) 0.005 % ophthalmic solution at bedtime. 09/20/16 09/20/17 Yes [provider]  lisinopril-hydrochlorothiazide (PRINZIDE,ZESTORETIC) 20-12.5 MG tablet TAKE 1 TABLETS EVERY DAY 07/11/14  Yes [provider]  loperamide (IMODIUM) 2 MG capsule Take by mouth as needed for diarrhea or loose stools.   Yes [provider]  metFORMIN (GLUCOPHAGE) 500 MG tablet Take 500 mg by mouth 2 (two) times daily with a meal.   Yes [provider]  metoprolol tartrate (LOPRESSOR)  25 MG tablet TAKE 1 TABLET EVERY DAY 07/11/14  Yes [provider]  rosuvastatin (CRESTOR) 20 MG tablet Take 20 mg by mouth daily.   Yes [provider]  canagliflozin (INVOKANA) 300 MG TABS tablet TAKE ONE (1) TABLET EACH DAY 11/05/14   [provider]  dicyclomine (BENTYL) 20 MG tablet TAKE ONE TABLET FOUR TIMES A DAY BEFORE MEALS AND AT BEDTIME Patient not taking: Reported on 03/08/2017 03/25/15   Gelene Mink, NP  hyoscyamine (LEVSIN/SL) 0.125 MG SL tablet Place 1 tablet (0.125 mg total) under the tongue every 4 (four) hours as needed. Patient not taking: Reported on 03/08/2017 05/05/15   Anice Paganini, NP    Allergies as of 03/08/2017  . (No Known Allergies)    Family History  Problem Relation Age of Onset  . Colon cancer Neg Hx     Social History   Socioeconomic History  . Marital status: Married    Spouse name: Not on file  . Number of children: Not on file  . Years of education: Not on file  . Highest education level: Not on file  Social Needs  . Financial resource strain: Not on file  . Food insecurity - worry: Not on file  . Food insecurity - inability: Not on file  . Transportation needs - medical: Not on file  . Transportation needs - non-medical: Not on file  Occupational History  . Not on file  Tobacco Use  . Smoking status: Former Smoker    Years: 9.00    Last attempt to  quit: 11/27/2006    Years since quitting: 10.2  . Smokeless tobacco: Never Used  Substance and Sexual Activity  . Alcohol use: No    Alcohol/week: 0.0 oz  . Drug use: No  . Sexual activity: Not on file  Other Topics Concern  . Not on file  Social History Narrative  . Not on file    Review of Systems: See HPI, otherwise negative ROS  Physical Exam: BP (!) 183/86   Pulse 79   Temp (!) 97.4 F (36.3 C) (Oral)   Ht 5\' 5"  (1.651 m)   Wt 188 lb 3.2 oz (85.4 kg)   BMI 31.32 kg/m  General:   Alert,  , pleasant and cooperative in NAD Neck:  Supple; no masses or  thyromegaly. No significant cervical adenopathy. Lungs:  Clear throughout to auscultation.   No wheezes, crackles, or rhonchi. No acute distress. Heart:  Regular rate and rhythm; no murmurs, clicks, rubs,  or gallops. Abdomen: Non-distended, normal bowel sounds.  Soft and nontender without appreciable mass or hepatosplenomegaly.  Pulses:  Normal pulses noted. Extremities:  Without clubbing or edema.  Impression:  Frequent but intermittent nature of diarrhea with fecal seepage incontinence suggests more irritable bowel syndrome with the lax sphincteric mechanism. She did very well with fiber Z previously but could not afford it.  Recommendations:  Resume Viberzi 100mg  once daily - sample pack for 16 days supplied  Information on kegal execercises given  Please call in 2 weeks and let us know how you are doing   Office visit in 3 months  If dramatic improvement in bowel symptoms not appreciated with above-mentioned regimen, would consider going back and doing segmental biopsies to rule out microscopic colitis.     Notice: This dictation was prepared with Dragon dictation along with smaller phrase technology. Any transcriptional errors that result from this process are unintentional and may not be corrected upon review.`

## 2017-03-23 ENCOUNTER — Telehealth: Payer: Self-pay | Admitting: Internal Medicine

## 2017-03-23 NOTE — Telephone Encounter (Signed)
Pt.notified

## 2017-03-23 NOTE — Telephone Encounter (Signed)
Use Viberzi only once daily. Add Imodium as needed. Hopefully, this is a flare which will pass and she will get back to her baseline

## 2017-03-23 NOTE — Telephone Encounter (Signed)
Pt is currently taking Viberzi 100 mg once daily since prescribed 03/08/17. Pt thought it was working well until 1-2 weeks after she had been on medication, diarrhea started. Pt has had a few watery stool and the rest has been diarrhea daily. Pt said she use to take Viberzi twice daily a few years ago when she had constipation. Pt isn't taking any medicines to control diarrhea.

## 2017-03-23 NOTE — Telephone Encounter (Signed)
Pt called this morning to say that she has been taking Viberzi and it was doing ok for awhile and now she is having diarrhea. Please advise and call her at [phone]

## 2017-03-30 ENCOUNTER — Telehealth: Payer: Self-pay | Admitting: Internal Medicine

## 2017-03-30 NOTE — Telephone Encounter (Signed)
Pt has been taking Viberzi and is some better, but still having loose stools. She said she would need a prescription called into [facility] [place] if RMR wanted her to continue taking medicine. Please advise. [phone]

## 2017-03-30 NOTE — Telephone Encounter (Signed)
Spoke with pt and she is taking Viberzi 100mg  daily. Pt called one week ago and said she had some diarrhea with medication. Pt was asked to take Imodium as needed for diarrhea and take Viberzi once daily.  Pt took Imodium once when she was out. She feels the medication works some but she has had some mild stomach pain when she has to have a bowel movement. Pt has a bowel movement up to 3 times daily and the last one usually is very loose. Pt isn't sure if she should continue with this medication or should she try another medication.

## 2017-04-03 NOTE — Telephone Encounter (Signed)
Continue Viberzi once daily - may add imodium not to exceed label instructions.  Going back to our discussion at last OV,  If persisting sx of diarrhea with current regimen, an ileo-colonsocopy with bx recommended to r/o IBD/microscopic colitis. So, lets get her in to get it scheduled

## 2017-04-04 MED ORDER — ELUXADOLINE 100 MG PO TABS: 100.0000 mg | ORAL_TABLET | Freq: Every day | ORAL | 3 refills | Status: DC

## 2017-04-04 NOTE — Telephone Encounter (Signed)
Spoke with pt, pt was notified. Pt wants to hold off on an appointment at the moment. Pt will call back to schedule appointment if needed.   Pt would like Viberzi 100mg  daily sent to her pharmacy. Pt has only been using samples. Ok to take meds per RMR.

## 2017-04-04 NOTE — Addendum Note (Signed)
Addended by: Tiffany KocherLEWIS, Sione Baumgarten S on: 04/04/2017 01:48 PM   Modules accepted: Orders

## 2017-04-04 NOTE — Telephone Encounter (Signed)
RX done. 

## 2017-04-04 NOTE — Telephone Encounter (Signed)
Noted  

## 2017-04-07 ENCOUNTER — Telehealth: Payer: Self-pay | Admitting: Internal Medicine

## 2017-04-07 NOTE — Telephone Encounter (Signed)
Spoke with pt. Viberzi PA was submitted and were waiting on an approval or denial. Sample given of Viberzi 100mg , 1 tab po daily.

## 2017-04-07 NOTE — Telephone Encounter (Signed)
Pt was asking about her prescription and could she get some samples of VIberzi to hold her until she can get her rx filled. Please call [phone]

## 2017-04-13 ENCOUNTER — Telehealth: Payer: Self-pay | Admitting: Internal Medicine

## 2017-04-13 NOTE — Telephone Encounter (Signed)
Spoke with pt PA was submitted and denied. Drugs of choice re Dicyclomine, loperamide capsule and Xifaxin tablet.

## 2017-04-13 NOTE — Telephone Encounter (Signed)
Patient got a letter stating that her viberzi was denied  Please call [phone]

## 2017-04-14 NOTE — Telephone Encounter (Signed)
Pt called back to see if RMR said which medication pt can try. Pt is aware that RMR is at the hospital all week and will respond when able. Pt is ok with that.

## 2017-04-16 NOTE — Telephone Encounter (Signed)
I would try loperamide as needed

## 2017-04-18 ENCOUNTER — Other Ambulatory Visit: Payer: Self-pay

## 2017-04-18 NOTE — Telephone Encounter (Signed)
Pt was notified of the medication to try. Pt wants to try an appeal. Will discuss further with pts insurance company.

## 2017-04-20 ENCOUNTER — Telehealth: Payer: Self-pay | Admitting: Internal Medicine

## 2017-04-20 NOTE — Telephone Encounter (Signed)
Pt was asking if she could get more samples of Viberzi. Please advise and call [phone]

## 2017-04-20 NOTE — Telephone Encounter (Signed)
Sample is ready for pickup. Pts son-in-law will pick sample up.

## 2017-04-26 NOTE — Telephone Encounter (Signed)
[facility]25/17 and [facility]. Waiting on approval or denial.

## 2017-04-27 ENCOUNTER — Telehealth: Payer: Self-pay

## 2017-04-27 NOTE — Telephone Encounter (Signed)
Pt called back and said she check with her pharmacy after the approval for Viberzi and her medication is still going to be over $200.00. Pt said she will try the loperamide as recommended by RMR. If that isn't helping her, she will call back.

## 2017-04-27 NOTE — Telephone Encounter (Signed)
[facility]. Vibersi is approved 04/27/17-01/03/18. Pt was notified of approval and will contact her pharmacy, in reference to her medication.

## 2017-04-28 ENCOUNTER — Encounter: Payer: Self-pay | Admitting: Internal Medicine

## 2017-09-27 ENCOUNTER — Encounter: Payer: Self-pay | Admitting: Internal Medicine

## 2017-09-27 ENCOUNTER — Ambulatory Visit: Payer: Medicare HMO | Admitting: Internal Medicine

## 2017-09-27 VITALS — BP 187/78 | HR 71 | Temp 97.2°F | Ht 66.0 in | Wt 185.2 lb

## 2017-09-27 DIAGNOSIS — R198 Other specified symptoms and signs involving the digestive system and abdomen: Secondary | ICD-10-CM | POA: Diagnosis not present

## 2017-09-27 NOTE — Progress Notes (Signed)
Primary Care Physician:  Joette Catching, MD Primary Gastroenterologist:  Dr. Jena Gauss  Pre-Procedure History & Physical: HPI:  Heather Oconnor is a 77 y.o. female here for follow-up of postprandial intermittent diarrhea and fecal seepage.  Describes a [place] stool 4  most of time;  may have a little leakage after having a bowel movement.  Uses 1 dose of Imodium daily.  Has not uped the dose 1 tablet daily.  Did not like Levsin/dicyclomine.  Viberzi worked the best but too expensive for long-term treatment.  Past Medical History:  Diagnosis Date  . Diabetes (HCC)   . Hypertension   . IBS (irritable bowel syndrome)       Prior to Admission medications   Medication Sig Start Date End Date Taking? Authorizing Provider  amLODipine (NORVASC) 10 MG tablet TAKE 1 TABLET EVERY DAY 07/11/14  Yes [provider]  aspirin 81 MG chewable tablet Chew 81 mg by mouth.   Yes [provider]  Budesonide (RHINOCORT ALLERGY NA) Place into the nose as needed.   Yes [provider]  clopidogrel (PLAVIX) 75 MG tablet TAKE 1 TABLET EVERY DAY 07/11/14  Yes [provider]  DORZOLAMIDE HCL-TIMOLOL MAL OP Apply to eye.   Yes [provider]  lisinopril-hydrochlorothiazide (PRINZIDE,ZESTORETIC) 20-12.5 MG tablet TAKE 1 TABLETS EVERY DAY 07/11/14  Yes [provider]  loperamide (IMODIUM) 2 MG capsule Take by mouth as needed for diarrhea or loose stools.   Yes [provider]  metFORMIN (GLUCOPHAGE) 500 MG tablet Take 500 mg by mouth 2 (two) times daily with a meal.   Yes [provider]  metoprolol tartrate (LOPRESSOR) 25 MG tablet TAKE 1 TABLET EVERY DAY 07/11/14  Yes [provider]  rosuvastatin (CRESTOR) 20 MG tablet Take 20 mg by mouth daily.   Yes [provider]  brimonidine (ALPHAGAN) 0.2 % ophthalmic solution 2 (two) times daily.     [provider]  canagliflozin (INVOKANA) 300 MG TABS tablet TAKE ONE (1)  TABLET EACH DAY 11/05/14   [provider]  dicyclomine (BENTYL) 20 MG tablet TAKE ONE TABLET FOUR TIMES A DAY BEFORE MEALS AND AT BEDTIME Patient not taking: Reported on 03/08/2017 03/25/15   Gelene Mink, NP  Eluxadoline (VIBERZI) 100 MG TABS Take 1 tablet (100 mg total) by mouth daily. Patient not taking: Reported on 09/27/2017 04/04/17   Tiffany Kocher, PA-C  hyoscyamine (LEVSIN/SL) 0.125 MG SL tablet Place 1 tablet (0.125 mg total) under the tongue every 4 (four) hours as needed. Patient not taking: Reported on 03/08/2017 05/05/15   Anice Paganini, NP    Allergies as of 09/27/2017  . (No Known Allergies)    Family History  Problem Relation Age of Onset  . Colon cancer Neg Hx     Social History   Socioeconomic History  . Marital status: Married    Spouse name: Not on file  . Number of children: Not on file  . Years of education: Not on file  . Highest education level: Not on file  Occupational History  . Not on file  Social Needs  . Financial resource strain: Not on file  . Food insecurity:    Worry: Not on file    Inability: Not on file  . Transportation needs:    Medical: Not on file    Non-medical: Not on file  Tobacco Use  . Smoking status: Former Smoker    Years: 9.00    Last attempt to quit:  11/27/2006    Years since quitting: 10.8  . Smokeless tobacco: Never Used  Substance and Sexual Activity  . Alcohol use: No    Alcohol/week: 0.0 standard drinks  . Drug use: No  . Sexual activity: Not on file  Lifestyle  . Physical activity:    Days per week: Not on file    Minutes per session: Not on file  . Stress: Not on file  Relationships  . Social connections:    Talks on phone: Not on file    Gets together: Not on file    Attends religious service: Not on file    Active member of club or organization: Not on file    Attends meetings of clubs or organizations: Not on file    Relationship status: Not on file  . Intimate partner violence:    Fear of current  or ex partner: Not on file    Emotionally abused: Not on file    Physically abused: Not on file    Forced sexual activity: Not on file  Other Topics Concern  . Not on file  Social History Narrative  . Not on file    Review of Systems: See HPI, otherwise negative ROS  Physical Exam: BP (!) 187/78   Pulse 71   Temp (!) 97.2 F (36.2 C) (Oral)   Ht 5\' 6"  (1.676 m)   Wt 185 lb 3.2 oz (84 kg)   BMI 29.89 kg/m  General:   Alert,   pleasant and cooperative in NAD Neck:  Supple; no masses or thyromegaly. No significant cervical adenopathy. Lungs:  Clear throughout to auscultation.   No wheezes, crackles, or rhonchi. No acute distress. Heart:  Regular rate and rhythm; no murmurs, clicks, rubs,  or gallops. Abdomen: Non-distended, normal bowel sounds.  Soft and nontender without appreciable mass or hepatosplenomegaly.  Pulses:  Normal pulses noted. Extremities:  Without clubbing or edema.  Impression/Plan: Altered bowel function.  Formed stools most of the time.  Occasional fecal seepage/leakage.  Can afford Viberzi- although it works the best.  She is taking Imodium very conservatively.  We have room to improve on her situation with this medication.    Recommendations:  May take up to 4 Imodium tablets daily ( 2 in the morning and go from there)  Keep a stool diary  Office visit in 3 months     Notice: This dictation was prepared with Dragon dictation along with smaller phrase technology. Any transcriptional errors that result from this process are unintentional and may not be corrected upon review.

## 2017-09-27 NOTE — Patient Instructions (Signed)
May take up to 4 Imodium tablets daily ( 2 in the morning and go from there)  Office visit in 3 months

## 2017-12-13 ENCOUNTER — Ambulatory Visit: Payer: Medicare HMO | Admitting: Internal Medicine

## 2017-12-13 ENCOUNTER — Encounter: Payer: Self-pay | Admitting: Internal Medicine

## 2017-12-13 VITALS — BP 183/83 | HR 64 | Temp 98.0°F | Ht 64.0 in | Wt 184.2 lb

## 2017-12-13 DIAGNOSIS — R194 Change in bowel habit: Secondary | ICD-10-CM | POA: Diagnosis not present

## 2017-12-13 NOTE — Patient Instructions (Signed)
Continue to use 1-2 Imodium (Loperamide) daily as needed for bowels  Office visit in 6 months  As discussed, no future colonoscopy recommended unless new symptoms develop      Thank you for entrusting me with your care. I appreciate the opportunity to create valuable relationships with patients and family members. You may receive a questionnaire in the mail regarding your visit here today.  It would be appreciated if you would take the time to return it.  If you were not completely satisfied with your experience, I would love to discuss any concerns with you.   R. Michael Rourk, Jonathon BellowsM.D. Marcial PacasFACP FACG, [facility]

## 2017-12-13 NOTE — Progress Notes (Signed)
Primary Care Physician:  Joette Catching, MD Primary Gastroenterologist:  Dr. Jena Gauss  Pre-Procedure History & Physical: HPI:  Heather Oconnor is a 77 y.o. female here for follow-up loose bowels and intermittent incontinence. Since starting Imodium 1-2 mg each morning. Her lower GI tract symptoms have resolved. She has 1-2 bowel movements daily. No bleeding. no incontinence. no difficulties with hygiene. She is quite pleased that this simple treatment has been so effective.  Past Medical History:  Diagnosis Date  . Diabetes (HCC)   . Hypertension   . IBS (irritable bowel syndrome)      Prior to Admission medications   Medication Sig Start Date End Date Taking? Authorizing Provider  amLODipine (NORVASC) 10 MG tablet TAKE 1 TABLET EVERY DAY 07/11/14  Yes [provider]  aspirin 81 MG chewable tablet Chew 81 mg by mouth.   Yes [provider]  clopidogrel (PLAVIX) 75 MG tablet TAKE 1 TABLET EVERY DAY 07/11/14  Yes [provider]  glipiZIDE (GLUCOTROL XL) 5 MG 24 hr tablet Take 1 tablet by mouth daily. 10/21/17  Yes [provider]  latanoprost (XALATAN) 0.005 % ophthalmic solution at bedtime. 10/24/17 10/24/18 Yes [provider]  lisinopril-hydrochlorothiazide (PRINZIDE,ZESTORETIC) 20-12.5 MG tablet TAKE 1 TABLETS EVERY DAY 07/11/14  Yes [provider]  loperamide (IMODIUM) 2 MG capsule Take 2 mg by mouth daily. Will take an additional if needed   Yes [provider]  metFORMIN (GLUCOPHAGE) 500 MG tablet Take 500 mg by mouth 2 (two) times daily with a meal.   Yes [provider]  metoprolol tartrate (LOPRESSOR) 25 MG tablet TAKE 1 TABLET EVERY DAY 07/11/14  Yes [provider]  rosuvastatin (CRESTOR) 20 MG tablet Take 20 mg by mouth daily.   Yes [provider]  brimonidine (ALPHAGAN) 0.2 % ophthalmic solution 2 (two) times daily.     [provider]  Budesonide (RHINOCORT ALLERGY NA) Place  into the nose as needed.    [provider]  canagliflozin (INVOKANA) 300 MG TABS tablet TAKE ONE (1) TABLET EACH DAY 11/05/14   [provider]  dicyclomine (BENTYL) 20 MG tablet TAKE ONE TABLET FOUR TIMES A DAY BEFORE MEALS AND AT BEDTIME Patient not taking: Reported on 03/08/2017 03/25/15   Gelene Mink, NP  DORZOLAMIDE HCL-TIMOLOL MAL OP Apply to eye.    [provider]  Eluxadoline (VIBERZI) 100 MG TABS Take 1 tablet (100 mg total) by mouth daily. Patient not taking: Reported on 09/27/2017 04/04/17   Tiffany Kocher, PA-C  hyoscyamine (LEVSIN/SL) 0.125 MG SL tablet Place 1 tablet (0.125 mg total) under the tongue every 4 (four) hours as needed. Patient not taking: Reported on 03/08/2017 05/05/15   Anice Paganini, NP    Allergies as of 12/13/2017  . (No Known Allergies)    Family History  Problem Relation Age of Onset  . Colon cancer Neg Hx     Social History   Socioeconomic History  . Marital status: Married    Spouse name: Not on file  . Number of children: Not on file  . Years of education: Not on file  . Highest education level: Not on file  Occupational History  . Not on file  Social Needs  . Financial resource strain: Not on file  . Food insecurity:    Worry: Not on file    Inability: Not on file  . Transportation needs:    Medical: Not on file    Non-medical: Not on  file  Tobacco Use  . Smoking status: Former Smoker    Years: 9.00    Last attempt to quit: 11/27/2006    Years since quitting: 11.0  . Smokeless tobacco: Never Used  Substance and Sexual Activity  . Alcohol use: No    Alcohol/week: 0.0 standard drinks  . Drug use: No  . Sexual activity: Not on file  Lifestyle  . Physical activity:    Days per week: Not on file    Minutes per session: Not on file  . Stress: Not on file  Relationships  . Social connections:    Talks on phone: Not on file    Gets together: Not on file    Attends religious service: Not on file    Active  member of club or organization: Not on file    Attends meetings of clubs or organizations: Not on file    Relationship status: Not on file  . Intimate partner violence:    Fear of current or ex partner: Not on file    Emotionally abused: Not on file    Physically abused: Not on file    Forced sexual activity: Not on file  Other Topics Concern  . Not on file  Social History Narrative  . Not on file    Review of Systems: See HPI, otherwise negative ROS  Physical Exam: BP (!) 183/83   Pulse 64   Temp 98 F (36.7 C) (Oral)   Ht 5\' 4"  (1.626 m)   Wt 184 lb 3.2 oz (83.6 kg)   BMI 31.62 kg/m  General:   Alert,   pleasant and cooperative in NAD;  Looks somewhatyounger than stated chronological age. Neck:  Supple; no masses or thyromegaly. No significant cervical adenopathy. Lungs:  Clear throughout to auscultation.   No wheezes, crackles, or rhonchi. No acute distress. Heart:  Regular rate and rhythm; no murmurs, clicks, rubs,  or gallops. Abdomen: Non-distended, normal bowel sounds.  Soft and nontender without appreciable mass or hepatosplenomegaly.  Pulses:  Normal pulses noted. Extremities:  Without clubbing or edema.  Impression/Plan:  Pleasant 77 year old lady with recent altered bowel function now normalized with 1-2 Imodium each morning. Likely an element of underlying irritable bowel syndrome and/ or diabetic enteropathy, plus minus metformin effect may be contributing factors. Her symptoms been nicely controlled recently.  Recommendations:  Continue to use 1-2 Imodium (Loperamide) daily as needed for bowels  Office visit in 6 months  As discussed, no future colonoscopy recommended unless new symptoms develop     Notice: This dictation was prepared with Dragon dictation along with smaller phrase technology. Any transcriptional errors that result from this process are unintentional and may not be corrected upon review.

## 2018-06-12 ENCOUNTER — Other Ambulatory Visit: Payer: Self-pay

## 2018-06-12 ENCOUNTER — Emergency Department (HOSPITAL_COMMUNITY): Payer: Medicare HMO

## 2018-06-12 ENCOUNTER — Encounter (HOSPITAL_COMMUNITY): Payer: Self-pay | Admitting: Emergency Medicine

## 2018-06-12 ENCOUNTER — Inpatient Hospital Stay (HOSPITAL_COMMUNITY)
Admission: EM | Admit: 2018-06-12 | Discharge: 2018-06-18 | DRG: 438 | Disposition: A | Discharge status: Home Health | Payer: Medicare HMO | Attending: Family Medicine | Admitting: Family Medicine

## 2018-06-12 DIAGNOSIS — Z7902 Long term (current) use of antithrombotics/antiplatelets: Secondary | ICD-10-CM | POA: Diagnosis not present

## 2018-06-12 DIAGNOSIS — I110 Hypertensive heart disease with heart failure: Secondary | ICD-10-CM | POA: Diagnosis present

## 2018-06-12 DIAGNOSIS — I5033 Acute on chronic diastolic (congestive) heart failure: Secondary | ICD-10-CM | POA: Diagnosis present

## 2018-06-12 DIAGNOSIS — Z7982 Long term (current) use of aspirin: Secondary | ICD-10-CM | POA: Diagnosis not present

## 2018-06-12 DIAGNOSIS — R945 Abnormal results of liver function studies: Secondary | ICD-10-CM

## 2018-06-12 DIAGNOSIS — Z79899 Other long term (current) drug therapy: Secondary | ICD-10-CM | POA: Diagnosis not present

## 2018-06-12 DIAGNOSIS — K6289 Other specified diseases of anus and rectum: Secondary | ICD-10-CM | POA: Diagnosis present

## 2018-06-12 DIAGNOSIS — Z87891 Personal history of nicotine dependence: Secondary | ICD-10-CM | POA: Diagnosis not present

## 2018-06-12 DIAGNOSIS — R112 Nausea with vomiting, unspecified: Secondary | ICD-10-CM | POA: Diagnosis not present

## 2018-06-12 DIAGNOSIS — Z6836 Body mass index (BMI) 36.0-36.9, adult: Secondary | ICD-10-CM

## 2018-06-12 DIAGNOSIS — K7460 Unspecified cirrhosis of liver: Secondary | ICD-10-CM | POA: Diagnosis present

## 2018-06-12 DIAGNOSIS — R0600 Dyspnea, unspecified: Secondary | ICD-10-CM | POA: Diagnosis present

## 2018-06-12 DIAGNOSIS — E872 Acidosis: Secondary | ICD-10-CM | POA: Diagnosis present

## 2018-06-12 DIAGNOSIS — I10 Essential (primary) hypertension: Secondary | ICD-10-CM | POA: Diagnosis present

## 2018-06-12 DIAGNOSIS — E860 Dehydration: Secondary | ICD-10-CM | POA: Diagnosis present

## 2018-06-12 DIAGNOSIS — F17200 Nicotine dependence, unspecified, uncomplicated: Secondary | ICD-10-CM | POA: Diagnosis present

## 2018-06-12 DIAGNOSIS — R0602 Shortness of breath: Secondary | ICD-10-CM

## 2018-06-12 DIAGNOSIS — K219 Gastro-esophageal reflux disease without esophagitis: Secondary | ICD-10-CM | POA: Diagnosis present

## 2018-06-12 DIAGNOSIS — R0689 Other abnormalities of breathing: Secondary | ICD-10-CM

## 2018-06-12 DIAGNOSIS — K8500 Idiopathic acute pancreatitis without necrosis or infection: Secondary | ICD-10-CM | POA: Diagnosis not present

## 2018-06-12 DIAGNOSIS — K760 Fatty (change of) liver, not elsewhere classified: Secondary | ICD-10-CM | POA: Diagnosis present

## 2018-06-12 DIAGNOSIS — J439 Emphysema, unspecified: Secondary | ICD-10-CM | POA: Diagnosis present

## 2018-06-12 DIAGNOSIS — E1165 Type 2 diabetes mellitus with hyperglycemia: Secondary | ICD-10-CM | POA: Diagnosis present

## 2018-06-12 DIAGNOSIS — K859 Acute pancreatitis without necrosis or infection, unspecified: Principal | ICD-10-CM | POA: Diagnosis present

## 2018-06-12 DIAGNOSIS — Z7984 Long term (current) use of oral hypoglycemic drugs: Secondary | ICD-10-CM | POA: Diagnosis not present

## 2018-06-12 DIAGNOSIS — K589 Irritable bowel syndrome without diarrhea: Secondary | ICD-10-CM | POA: Diagnosis present

## 2018-06-12 DIAGNOSIS — J9601 Acute respiratory failure with hypoxia: Secondary | ICD-10-CM | POA: Diagnosis present

## 2018-06-12 DIAGNOSIS — G459 Transient cerebral ischemic attack, unspecified: Secondary | ICD-10-CM | POA: Diagnosis present

## 2018-06-12 DIAGNOSIS — Z8673 Personal history of transient ischemic attack (TIA), and cerebral infarction without residual deficits: Secondary | ICD-10-CM | POA: Diagnosis present

## 2018-06-12 DIAGNOSIS — K580 Irritable bowel syndrome with diarrhea: Secondary | ICD-10-CM | POA: Diagnosis present

## 2018-06-12 DIAGNOSIS — Z1159 Encounter for screening for other viral diseases: Secondary | ICD-10-CM

## 2018-06-12 DIAGNOSIS — E669 Obesity, unspecified: Secondary | ICD-10-CM | POA: Diagnosis present

## 2018-06-12 DIAGNOSIS — E119 Type 2 diabetes mellitus without complications: Secondary | ICD-10-CM

## 2018-06-12 DIAGNOSIS — F419 Anxiety disorder, unspecified: Secondary | ICD-10-CM | POA: Diagnosis present

## 2018-06-12 DIAGNOSIS — R7989 Other specified abnormal findings of blood chemistry: Secondary | ICD-10-CM | POA: Diagnosis present

## 2018-06-12 HISTORY — DX: Gastro-esophageal reflux disease without esophagitis: K21.9

## 2018-06-12 HISTORY — DX: Unspecified hemorrhoids: K64.9

## 2018-06-12 LAB — CBC WITH DIFFERENTIAL/PLATELET
Abs Immature Granulocytes: 0.05 10*3/uL (ref 0.00–0.07)
Basophils Absolute: 0 10*3/uL (ref 0.0–0.1)
Basophils Relative: 0 %
Eosinophils Absolute: 0 10*3/uL (ref 0.0–0.5)
Eosinophils Relative: 0 %
HCT: 49.5 % — ABNORMAL HIGH (ref 36.0–46.0)
Hemoglobin: 17 g/dL — ABNORMAL HIGH (ref 12.0–15.0)
Immature Granulocytes: 0 %
Lymphocytes Relative: 4 %
Lymphs Abs: 0.5 10*3/uL — ABNORMAL LOW (ref 0.7–4.0)
MCH: 29.2 pg (ref 26.0–34.0)
MCHC: 34.3 g/dL (ref 30.0–36.0)
MCV: 84.9 fL (ref 80.0–100.0)
Monocytes Absolute: 0.5 10*3/uL (ref 0.1–1.0)
Monocytes Relative: 4 %
Neutro Abs: 13.1 10*3/uL — ABNORMAL HIGH (ref 1.7–7.7)
Neutrophils Relative %: 92 %
Platelets: 223 10*3/uL (ref 150–400)
RBC: 5.83 MIL/uL — ABNORMAL HIGH (ref 3.87–5.11)
RDW: 14.2 % (ref 11.5–15.5)
WBC: 14.3 10*3/uL — ABNORMAL HIGH (ref 4.0–10.5)
nRBC: 0 % (ref 0.0–0.2)

## 2018-06-12 LAB — LIPID PANEL
Cholesterol: 183 mg/dL (ref 0–200)
HDL: 40 mg/dL — ABNORMAL LOW (ref >40)
LDL Cholesterol: 116 mg/dL — ABNORMAL HIGH (ref 0–99)
Total CHOL/HDL Ratio: 4.6 RATIO
Triglycerides: 133 mg/dL (ref <150)
VLDL: 27 mg/dL (ref 0–40)

## 2018-06-12 LAB — LACTIC ACID, PLASMA
Lactic Acid, Venous: 4 mmol/L (ref 0.5–1.9)
Lactic Acid, Venous: 3.6 mmol/L (ref 0.5–1.9)

## 2018-06-12 LAB — COMPREHENSIVE METABOLIC PANEL
ALT: 219 U/L — ABNORMAL HIGH (ref 0–44)
AST: 153 U/L — ABNORMAL HIGH (ref 15–41)
Albumin: 4 g/dL (ref 3.5–5.0)
Alkaline Phosphatase: 48 U/L (ref 38–126)
Anion gap: 19 — ABNORMAL HIGH (ref 5–15)
BUN: 21 mg/dL (ref 8–23)
CO2: 23 mmol/L (ref 22–32)
Calcium: 9.2 mg/dL (ref 8.9–10.3)
Chloride: 94 mmol/L — ABNORMAL LOW (ref 98–111)
Creatinine, Ser: 0.9 mg/dL (ref 0.44–1.00)
GFR calc Af Amer: >60 mL/min (ref >60)
GFR calc non Af Amer: >60 mL/min (ref >60)
Glucose, Bld: 334 mg/dL — ABNORMAL HIGH (ref 70–99)
Potassium: 3.3 mmol/L — ABNORMAL LOW (ref 3.5–5.1)
Sodium: 136 mmol/L (ref 135–145)
Total Bilirubin: 2.1 mg/dL — ABNORMAL HIGH (ref 0.3–1.2)
Total Protein: 6.9 g/dL (ref 6.5–8.1)

## 2018-06-12 LAB — URINALYSIS, ROUTINE W REFLEX MICROSCOPIC
Glucose, UA: 150 mg/dL — AB
Hgb urine dipstick: NEGATIVE
Ketones, ur: NEGATIVE mg/dL
Leukocytes,Ua: NEGATIVE
Nitrite: NEGATIVE
Protein, ur: 100 mg/dL — AB
Specific Gravity, Urine: 1.038 — ABNORMAL HIGH (ref 1.005–1.030)
pH: 5 (ref 5.0–8.0)

## 2018-06-12 LAB — GLUCOSE, CAPILLARY
Glucose-Capillary: 216 mg/dL — ABNORMAL HIGH (ref 70–99)
Glucose-Capillary: 176 mg/dL — ABNORMAL HIGH (ref 70–99)
Glucose-Capillary: 150 mg/dL — ABNORMAL HIGH (ref 70–99)

## 2018-06-12 LAB — LIPASE, BLOOD: Lipase: 1169 U/L — ABNORMAL HIGH (ref 11–51)

## 2018-06-12 LAB — SARS CORONAVIRUS 2 BY RT PCR (HOSPITAL ORDER, PERFORMED IN ~~LOC~~ HOSPITAL LAB): SARS Coronavirus 2: NEGATIVE

## 2018-06-12 LAB — TROPONIN I: Troponin I: <0.03 ng/mL (ref <0.03)

## 2018-06-12 MED ORDER — SODIUM CHLORIDE 0.9 % IV SOLN
INTRAVENOUS | Status: DC
  Administered 2018-06-13: 07:00:00 via INTRAVENOUS

## 2018-06-12 MED ORDER — SODIUM CHLORIDE 0.9 % IV BOLUS
1000.0000 mL | Freq: Once | INTRAVENOUS | Status: AC
  Administered 2018-06-12: 1000 mL via INTRAVENOUS

## 2018-06-12 MED ORDER — SODIUM CHLORIDE 0.9 % IV SOLN
INTRAVENOUS | Status: DC
  Administered 2018-06-12 (×2): via INTRAVENOUS

## 2018-06-12 MED ORDER — SODIUM CHLORIDE 0.9 % IV SOLN
INTRAVENOUS | Status: DC
  Administered 2018-06-12 (×2): via INTRAVENOUS

## 2018-06-12 MED ORDER — ENOXAPARIN SODIUM 40 MG/0.4ML ~~LOC~~ SOLN
40.0000 mg | SUBCUTANEOUS | Status: DC
  Administered 2018-06-12 – 2018-06-17 (×6): 40 mg via SUBCUTANEOUS
  Filled 2018-06-12 (×7): qty 0.4

## 2018-06-12 MED ORDER — LACTATED RINGERS IV BOLUS
1000.0000 mL | Freq: Once | INTRAVENOUS | Status: AC
  Administered 2018-06-12: 1000 mL via INTRAVENOUS

## 2018-06-12 MED ORDER — METOPROLOL TARTRATE 25 MG PO TABS
25.0000 mg | ORAL_TABLET | Freq: Every day | ORAL | Status: DC
  Administered 2018-06-12 – 2018-06-14 (×3): 25 mg via ORAL
  Filled 2018-06-12 (×3): qty 1

## 2018-06-12 MED ORDER — MORPHINE SULFATE (PF) 4 MG/ML IV SOLN
4.0000 mg | INTRAVENOUS | Status: DC | PRN
  Administered 2018-06-12: 4 mg via INTRAVENOUS
  Filled 2018-06-12: qty 1

## 2018-06-12 MED ORDER — POTASSIUM CHLORIDE 10 MEQ/100ML IV SOLN
10.0000 meq | INTRAVENOUS | Status: AC
  Administered 2018-06-12 (×3): 10 meq via INTRAVENOUS
  Filled 2018-06-12 (×3): qty 100

## 2018-06-12 MED ORDER — ONDANSETRON HCL 4 MG PO TABS: 4.0000 mg | ORAL_TABLET | Freq: Four times a day (QID) | ORAL | Status: DC | PRN

## 2018-06-12 MED ORDER — ONDANSETRON HCL 4 MG/2ML IJ SOLN
4.0000 mg | Freq: Four times a day (QID) | INTRAMUSCULAR | Status: DC | PRN
  Administered 2018-06-16: 4 mg via INTRAVENOUS
  Filled 2018-06-12: qty 2

## 2018-06-12 MED ORDER — FAMOTIDINE IN NACL 20-0.9 MG/50ML-% IV SOLN
20.0000 mg | Freq: Once | INTRAVENOUS | Status: AC
  Administered 2018-06-12: 20 mg via INTRAVENOUS
  Filled 2018-06-12: qty 50

## 2018-06-12 MED ORDER — IOHEXOL 300 MG/ML  SOLN
100.0000 mL | Freq: Once | INTRAMUSCULAR | Status: AC | PRN
  Administered 2018-06-12: 100 mL via INTRAVENOUS

## 2018-06-12 MED ORDER — HYDRALAZINE HCL 20 MG/ML IJ SOLN
10.0000 mg | INTRAMUSCULAR | Status: DC | PRN
  Administered 2018-06-13 – 2018-06-14 (×2): 10 mg via INTRAVENOUS
  Filled 2018-06-12 (×3): qty 1

## 2018-06-12 MED ORDER — INSULIN ASPART 100 UNIT/ML ~~LOC~~ SOLN: 0.0000 [IU] | Freq: Every day | SUBCUTANEOUS | Status: DC

## 2018-06-12 MED ORDER — INSULIN ASPART 100 UNIT/ML ~~LOC~~ SOLN
0.0000 [IU] | Freq: Four times a day (QID) | SUBCUTANEOUS | Status: DC
  Administered 2018-06-12: 2 [IU] via SUBCUTANEOUS
  Administered 2018-06-12: 5 [IU] via SUBCUTANEOUS
  Administered 2018-06-13 – 2018-06-14 (×4): 2 [IU] via SUBCUTANEOUS
  Administered 2018-06-14 – 2018-06-16 (×6): 3 [IU] via SUBCUTANEOUS

## 2018-06-12 MED ORDER — MORPHINE SULFATE (PF) 4 MG/ML IV SOLN
4.0000 mg | INTRAVENOUS | Status: AC | PRN
  Administered 2018-06-12 (×2): 4 mg via INTRAVENOUS
  Filled 2018-06-12 (×2): qty 1

## 2018-06-12 MED ORDER — POTASSIUM CHLORIDE 10 MEQ/100ML IV SOLN
10.0000 meq | Freq: Once | INTRAVENOUS | Status: AC
  Administered 2018-06-12: 10 meq via INTRAVENOUS
  Filled 2018-06-12: qty 100

## 2018-06-12 MED ORDER — HYDROMORPHONE HCL 1 MG/ML IJ SOLN
1.0000 mg | INTRAMUSCULAR | Status: DC | PRN
  Administered 2018-06-12 – 2018-06-13 (×4): 1 mg via INTRAVENOUS
  Filled 2018-06-12 (×5): qty 1

## 2018-06-12 MED ORDER — ONDANSETRON HCL 4 MG/2ML IJ SOLN
4.0000 mg | INTRAMUSCULAR | Status: AC | PRN
  Administered 2018-06-12 (×2): 4 mg via INTRAVENOUS
  Filled 2018-06-12 (×2): qty 2

## 2018-06-12 NOTE — ED Notes (Signed)
ED TO INPATIENT HANDOFF REPORT  ED Nurse Name and Phone #: Osie Cheeks Name/Age/Gender Heather Oconnor 78 y.o. female Room/Bed: APA05/APA05  Code Status   Code Status: Not on file  Home/SNF/Other Home Patient oriented to: self, place, time and situation Is this baseline? yes  Triage Complete: Triage complete  Chief Complaint Abdominal pain; Emesis  Triage Note Pt reports upper abd pain starting yesterday progressing to generalized pain with vomiting and diarrhea.    Allergies No Known Allergies  Level of Care/Admitting Diagnosis ED Disposition    ED Disposition Condition [facility] Area: [facility] [[id number]]  Level of Care: Med-Surg [16]  Covid Evaluation: N/A  Diagnosis: Acute pancreatitis [577.0.ICD-9-CM]  Admitting Physician: Rodena Goldmann [[id number]]  Attending Physician: Rodena Goldmann [[id number]]  Estimated length of stay: past midnight tomorrow  Certification:: I certify this patient will need inpatient services for at least 2 midnights  PT Class (Do Not Modify): Inpatient [101]  PT Acc Code (Do Not Modify): Private [1]       B Medical/Surgery History Past Medical History:  Diagnosis Date  . Diabetes ([place])   . GERD (gastroesophageal reflux disease)   . Hemorrhoids   . Hypertension   . IBS (irritable bowel syndrome)    Past Surgical History:  Procedure Laterality Date  . COLONOSCOPY  09/07/2007   RMR: anal canal/external hemorrhoids, redundant colon, left-sided diverticula, otherwise normal colonic mucosa     A IV Location/Drains/Wounds Patient Lines/Drains/Airways Status   Active Line/Drains/Airways    Name:   Placement date:   Placement time:   Site:   Days:   Peripheral IV 06/12/18 Left Antecubital   06/12/18    1211    Antecubital   less than 1          Intake/Output Last 24 hours  Intake/Output Summary (Last 24 hours) at 06/12/2018 1317 Last data filed at 06/12/2018 0919 Gross per 24 hour  Intake 50 ml  Output -   Net 50 ml    Labs/Imaging Results for orders placed or performed during the hospital encounter of 06/12/18 (from the past 48 hour(s))  Urinalysis, Routine w reflex microscopic     Status: Abnormal   Collection Time: 06/12/18  7:38 AM  Result Value Ref Range   Color, Urine AMBER (A) YELLOW    Comment: BIOCHEMICALS MAY BE AFFECTED BY COLOR   APPearance HAZY (A) CLEAR   Specific Gravity, Urine 1.038 (H) 1.005 - 1.030   pH 5.0 5.0 - 8.0   Glucose, UA 150 (A) NEGATIVE mg/dL   Hgb urine dipstick NEGATIVE NEGATIVE   Bilirubin Urine SMALL (A) NEGATIVE   Ketones, ur NEGATIVE NEGATIVE mg/dL   Protein, ur 100 (A) NEGATIVE mg/dL   Nitrite NEGATIVE NEGATIVE   Leukocytes,Ua NEGATIVE NEGATIVE   RBC / HPF 0-5 0 - 5 RBC/hpf   WBC, UA 6-10 0 - 5 WBC/hpf   Bacteria, UA RARE (A) NONE SEEN   Squamous Epithelial / LPF 6-10 0 - 5   Mucus PRESENT    Hyaline Casts, UA PRESENT     Comment: Performed at [facility], [address], [place], [place] [address]  Comprehensive metabolic panel     Status: Abnormal   Collection Time: 06/12/18  8:15 AM  Result Value Ref Range   Sodium 136 135 - 145 mmol/L   Potassium 3.3 (L) 3.5 - 5.1 mmol/L   Chloride 94 (L) 98 - 111 mmol/L   CO2 23 22 - 32  mmol/L   Glucose, Bld 334 (H) 70 - 99 mg/dL   BUN 21 8 - 23 mg/dL   Creatinine, Ser 1.610.90 0.44 - 1.00 mg/dL   Calcium 9.2 8.9 - 09.610.3 mg/dL   Total Protein 6.9 6.5 - 8.1 g/dL   Albumin 4.0 3.5 - 5.0 g/dL   AST 045153 (H) 15 - 41 U/L   ALT 219 (H) 0 - 44 U/L   Alkaline Phosphatase 48 38 - 126 U/L   Total Bilirubin 2.1 (H) 0.3 - 1.2 mg/dL   GFR calc non Af Amer >60 >60 mL/min   GFR calc Af Amer >60 >60 mL/min   Anion gap 19 (H) 5 - 15    Comment: Performed at [facility], [address] [place], [place] [address]  Lipase, blood     Status: Abnormal   Collection Time: 06/12/18  8:15 AM  Result Value Ref Range   Lipase 1,169 (H) 11 - 51 U/L    Comment: RESULTS CONFIRMED BY MANUAL DILUTION Performed at [facility], [address] [place], [place] [address]   Troponin I - Once     Status: None   Collection Time: 06/12/18  8:15 AM  Result Value Ref Range   Troponin I <0.03 <0.03 ng/mL    Comment: Performed at [facility], [address] [place], [place] [address]  CBC with Differential     Status: Abnormal   Collection Time: 06/12/18  8:15 AM  Result Value Ref Range   WBC 14.3 (H) 4.0 - 10.5 K/uL    Comment: WHITE COUNT CONFIRMED ON SMEAR   RBC 5.83 (H) 3.87 - 5.11 MIL/uL   Hemoglobin 17.0 (H) 12.0 - 15.0 g/dL   HCT 21.349.5 (H) 08.636.0 - 57.846.0 %   MCV 84.9 80.0 - 100.0 fL   MCH 29.2 26.0 - 34.0 pg   MCHC 34.3 30.0 - 36.0 g/dL   RDW 46.914.2 62.911.5 - 52.815.5 %   Platelets 223 150 - 400 K/uL   nRBC 0.0 0.0 - 0.2 %   Neutrophils Relative % 92 %   Neutro Abs 13.1 (H) 1.7 - 7.7 K/uL   Lymphocytes Relative 4 %   Lymphs Abs 0.5 (L) 0.7 - 4.0 K/uL   Monocytes Relative 4 %   Monocytes Absolute 0.5 0.1 - 1.0 K/uL   Eosinophils Relative 0 %   Eosinophils Absolute 0.0 0.0 - 0.5 K/uL   Basophils Relative 0 %   Basophils Absolute 0.0 0.0 - 0.1 K/uL   Immature Granulocytes 0 %   Abs Immature Granulocytes 0.05 0.00 - 0.07 K/uL    Comment: Performed at [facility], [address] [place], [place] [address]  Lactic acid, plasma     Status: Abnormal   Collection Time: 06/12/18  8:43 AM  Result Value Ref Range   Lactic Acid, Venous 4.0 (HH) 0.5 - 1.9 mmol/L    Comment: CRITICAL RESULT CALLED TO, READ BACK BY AND VERIFIED WITH: Prescott Truex,R AT 9:10AM ON 06/12/18 BY [facility] Center[facility]STER[facility],C Performed at [facility], [address] [place], [place] [address]   SARS Coronavirus 2 (CEPHEID - Performed in [facility] hospital lab), Hosp Order     Status: None   Collection Time: 06/12/18  9:19 AM  Result Value Ref Range   SARS Coronavirus 2 NEGATIVE NEGATIVE    Comment: (NOTE) If result is NEGATIVE SARS-CoV-2 target nucleic acids are NOT DETECTED. The SARS-CoV-2 RNA is generally detectable in upper and lower  respiratory specimens  during the acute phase of infection.  The lowest  concentration of SARS-CoV-2 viral copies this assay can detect is 250  copies / mL. A negative result does not preclude SARS-CoV-2 infection  and should not be used as the sole basis for treatment or other  patient management decisions.  A negative result may occur with  improper specimen collection / handling, submission of specimen other  than nasopharyngeal swab, presence of viral mutation(s) within the  areas targeted by this assay, and inadequate number of viral copies  (<250 copies / mL). A negative result must be combined with clinical  observations, patient history, and epidemiological information. If result is POSITIVE SARS-CoV-2 target nucleic acids are DETECTED. The SARS-CoV-2 RNA is generally detectable in upper and lower  respiratory specimens dur ing the acute phase of infection.  Positive  results are indicative of active infection with SARS-CoV-2.  Clinical  correlation with patient history and other diagnostic information is  necessary to determine patient infection status.  Positive results do  not rule out bacterial infection or co-infection with other viruses. If result is PRESUMPTIVE POSTIVE SARS-CoV-2 nucleic acids MAY BE PRESENT.   A presumptive positive result was obtained on the submitted specimen  and confirmed on repeat testing.  While 2019 novel coronavirus  (SARS-CoV-2) nucleic acids may be present in the submitted sample  additional confirmatory testing may be necessary for epidemiological  and / or clinical management purposes  to differentiate between  SARS-CoV-2 and other Sarbecovirus currently known to infect humans.  If clinically indicated additional testing with an alternate test  methodology ([id number]) is advised. The SARS-CoV-2 RNA is generally  detectable in upper and lower respiratory sp ecimens during the acute  phase of infection. The expected result is Negative. Fact Sheet for Patients:   https://www.fda.gov/media/136312/download Fact Sheet for Healthcare Providers: https://www.fda.gov/media/136313/download This test is not yet approved or cleared by the [place] FDA and has been authorized for detection and/or diagnosis of SARS-CoV-2 by FDA under an Emergency Use Authorization (EUA).  This EUA will remain in effect (meaning this test can be used) for the duration of the COVID-19 declaration under Section 564(b)(1) of the Act, 21 U.S.C. section 360bbb-3(b)(1), unless the authorization is terminated or revoked sooner. Performed at [facility], [address] [place], [place] [address]   Lactic acid, plasma     Status: Abnormal   Collection Time: 06/12/18 10:38 AM  Result Value Ref Range   Lactic Acid, Venous 3.6 (HH) 0.5 - 1.9 mmol/L    Comment: CRITICAL RESULT CALLED TO, READ BACK BY AND VERIFIED WITH: Eiko Mcgowen,R AT 11:15AM ON 06/12/18 BY FESTERMAN,C Performed at [facility], [address] [place], [place] [address]MEASUREMEN asilar atelectasis right greater than left. Hepatobiliary: Decreased attenuation throughout the liver  consistent with cirrhosis with  mild associated ascites. The gallbladder is well distended. Pancreas: Pancreas is well visualized and demonstrates some cystic changes as well as peripancreatic inflammatory changes consistent with pancreatitis. Spleen: Spleen shows a small hypodensity likely representing hemangioma. Adrenals/Urinary Tract: The adrenal glands are within normal limits. Normal excretion of contrast is noted from the kidneys bilaterally. No renal calculi or obstructive changes are seen. The bladder is partially distended. Stomach/Bowel: Diverticulosis without evidence of diverticulitis. No obstructive or inflammatory changes of the large or small bowel are seen. The appendix is within normal limits. Vascular/Lymphatic: Heavy atherosclerotic calcifications are noted without aneurysmal dilatation. No lymphadenopathy is seen. Reproductive: Uterus and bilateral adnexa are unremarkable. Other: No abdominal wall hernia or abnormality. No abdominopelvic ascites. Musculoskeletal: Degenerative changes of lumbar spine are noted. IMPRESSION: Changes consistent with pancreatitis and peripancreatic inflammatory change. Decreased attenuation of the liver consistent with underlying cirrhosis. Mild ascites is noted which may be related to the cirrhosis. Bibasilar atelectasis right greater than left. Chronic changes as described above. Electronically Signed   By: Alcide CleverMark  Lukens M.D.   On: 06/12/2018 12:41   [place] Abdomen Limited Ruq  Result Date: 06/12/2018 CLINICAL DATA:  Elevated LFTs.  Personal history of cirrhosis. EXAM: ULTRASOUND ABDOMEN LIMITED RIGHT UPPER QUADRANT COMPARISON:  None. FINDINGS: Gallbladder: No gallstones or wall thickening visualized. No sonographic Murphy sign noted by sonographer. Common bile duct: Diameter: 6.3 mm, upper limits of normal Liver: The liver is diffusely echogenic. Internal architecture is lost. No discrete lesions are present. Sensitivity is decreased due to hyperechoic parenchyma and  body habitus. A small amount of free fluid is evident. Portal vein is patent on color Doppler imaging with normal direction of blood flow towards the liver. IMPRESSION: 1. Normal sonographic appearance of the gallbladder. 2. The liver is diffusely echogenic. Findings are consistent with the given diagnosis of cirrhosis. No discrete lesion is present. 3. Ascites. Electronically Signed   By: Marin Robertshristopher  Mattern M.D.   On: 06/12/2018 11:06    Pending Labs Unresulted Labs (From admission, onward)    Start     Ordered   06/12/18 0738  Urine culture  ONCE - STAT,   STAT     06/12/18 0737          Vitals/Pain Today's Vitals   06/12/18 1000 06/12/18 1030 06/12/18 1234 06/12/18 1300  BP: (!) 152/75 (!) 151/76 (!) 198/81 (!) 189/77  Pulse: 93 93 (!) 121 (!) 121  Resp: (!) 29 (!) 26 18 (!) 31  Temp:      TempSrc:      SpO2: 91% 90% (!) 88% 93%  Weight:      Height:      PainSc:        Isolation Precautions No active isolations  Medications Medications  0.9 %  sodium chloride infusion ( Intravenous New Bag/Given 06/12/18 0807)  morphine 4 MG/ML injection 4 mg (4 mg Intravenous Given 06/12/18 1316)  ondansetron (ZOFRAN) injection 4 mg (4 mg Intravenous Given 06/12/18 0934)  morphine 4 MG/ML injection 4 mg (4 mg Intravenous Given 06/12/18 0934)  famotidine (PEPCID) IVPB 20 mg premix (0 mg Intravenous Stopped 06/12/18 0919)  iohexol (OMNIPAQUE) 300 MG/ML solution 100 mL (100 mLs Intravenous Contrast Given 06/12/18 1159)  sodium chloride 0.9 % bolus 1,000 mL (1,000 mLs Intravenous New Bag/Given 06/12/18 1003)  sodium chloride 0.9 % bolus 1,000 mL (1,000 mLs Intravenous New Bag/Given 06/12/18 1144)    Mobility walks High fall risk   Focused Assessments    R Recommendations: See Admitting Provider  Note  Report given to:   Additional Notes:

## 2018-06-12 NOTE — Consult Note (Signed)
Referring Provider: Dr. Sherryll BurgerShah  Primary Care Physician:  Joette CatchingNyland, Leonard, MD Primary Gastroenterologist:  Dr. Jena Gaussourk   Date of Admission: 06/12/2018 Date of Consultation: 06/12/2018  Reason for Consultation:  Acute pancreatitis   HPI:  Heather Oconnor is a 78 y.o. year old female with past GI history of postprandial intermittent diarrhea, fecal seepage, last seen as outpatient in Dec 2019 for routine follow-up. Presented to the ED today with acute abdominal pain, N/V that started yesterday. Lipase elevated at 1169, elevated transaminases with AST 153 and ALT 219, bilirubin 2.1. RUQ ultrasound with CBD 6.3 mm, no gallstones. Concern for cirrhosis on imaging, which is a new finding. CT abd/pelvis with contrast with pancreatitis and associated inflammatory changes around pancreas, cirrhosis, mild ascites.    At time of consultation, she had just received pain medication and had difficulty staying awake. She was able to answer questions but quickly fell asleep again. She asked that I return tomorrow when she will remember everything. She notes onset of pain yesterday, with associated N/V. No prior episodes. Pain much improved with medication.   She has received 2 liters of NS bolus this morning and an additional 1 liter this afternoon. States this has been a rough day but currently without pain. Has taken Viberzi in the past but has not been taking any recently and manages diarrhea with imodium.     Past Medical History:  Diagnosis Date  . Diabetes (HCC)   . GERD (gastroesophageal reflux disease)   . Hemorrhoids   . Hypertension   . IBS (irritable bowel syndrome)     Past Surgical History:  Procedure Laterality Date  . COLONOSCOPY  09/07/2007   RMR: anal canal/external hemorrhoids, redundant colon, left-sided diverticula, otherwise normal colonic mucosa    Prior to Admission medications   Medication Sig Start Date End Date Taking? Authorizing Provider  acetaminophen (TYLENOL) 500 MG  tablet Take 1,000 mg by mouth every 6 (six) hours as needed.   Yes [provider]  amLODipine (NORVASC) 10 MG tablet Take 10 mg by mouth daily.  07/11/14  Yes [provider]  aspirin 81 MG chewable tablet Chew 81 mg by mouth daily.    Yes [provider]  clopidogrel (PLAVIX) 75 MG tablet Take 75 mg by mouth daily.  07/11/14  Yes [provider]  DORZOLAMIDE HCL-TIMOLOL MAL OP Apply 1 drop to eye daily.    Yes [provider]  glipiZIDE (GLUCOTROL XL) 5 MG 24 hr tablet Take 1 tablet by mouth daily. 10/21/17  Yes [provider]  latanoprost (XALATAN) 0.005 % ophthalmic solution Place 1 drop into the right eye at bedtime.  10/24/17 10/24/18 Yes [provider]  lisinopril-hydrochlorothiazide (PRINZIDE,ZESTORETIC) 20-12.5 MG tablet Take 1 tablet by mouth daily.  07/11/14  Yes [provider]  loperamide (IMODIUM) 2 MG capsule Take 2 mg by mouth daily. Will take an additional if needed   Yes [provider]  metFORMIN (GLUCOPHAGE) 500 MG tablet Take 1,000 mg by mouth daily.    Yes [provider]  metoprolol succinate (TOPROL-XL) 100 MG 24 hr tablet Take 100 mg by mouth daily.  04/17/18  Yes [provider]    Current Facility-Administered Medications  Medication Dose Route Frequency Provider Last Rate Last Dose  . 0.9 %  sodium chloride infusion   Intravenous Continuous Maurilio LovelyShah, Pratik D, DO 100 mL/hr at 06/12/18 0807    . enoxaparin (LOVENOX) injection 40 mg  40 mg Subcutaneous Q24H Sherryll BurgerShah, Pratik D,  DO   40 mg at 06/12/18 1503  . hydrALAZINE (APRESOLINE) injection 10 mg  10 mg Intravenous Q4H PRN Sherryll BurgerShah, Pratik D, DO      . HYDROmorphone (DILAUDID) injection 1 mg  1 mg Intravenous Q3H PRN Sherryll BurgerShah, Pratik D, DO   1 mg at 06/12/18 1504  . insulin aspart (novoLOG) injection 0-15 Units  0-15 Units Subcutaneous Q6H Shah, Pratik D, DO   5 Units at 06/12/18 1502  . insulin aspart (novoLOG) injection 0-5 Units  0-5 Units  Subcutaneous QHS Sherryll BurgerShah, Pratik D, DO      . metoprolol tartrate (LOPRESSOR) tablet 25 mg  25 mg Oral Daily Sherryll BurgerShah, Pratik D, DO   25 mg at 06/12/18 1503  . ondansetron (ZOFRAN) tablet 4 mg  4 mg Oral Q6H PRN Sherryll BurgerShah, Pratik D, DO       Or  . ondansetron (ZOFRAN) injection 4 mg  4 mg Intravenous Q6H PRN Sherryll BurgerShah, Pratik D, DO      . potassium chloride 10 mEq in 100 mL IVPB  10 mEq Intravenous Q1 Hr x 4 Shah, Pratik D, DO 100 mL/hr at 06/12/18 1514 10 mEq at 06/12/18 1514    Allergies as of 06/12/2018  . (No Known Allergies)    Family History  Problem Relation Age of Onset  . Colon cancer Neg Hx     Social History   Socioeconomic History  . Marital status: Married    Spouse name: Not on file  . Number of children: Not on file  . Years of education: Not on file  . Highest education level: Not on file  Occupational History  . Not on file  Social Needs  . Financial resource strain: Not on file  . Food insecurity:    Worry: Not on file    Inability: Not on file  . Transportation needs:    Medical: Not on file    Non-medical: Not on file  Tobacco Use  . Smoking status: Former Smoker    Years: 9.00    Last attempt to quit: 11/27/2006    Years since quitting: 11.5  . Smokeless tobacco: Never Used  Substance and Sexual Activity  . Alcohol use: No    Alcohol/week: 0.0 standard drinks  . Drug use: No  . Sexual activity: Not on file  Lifestyle  . Physical activity:    Days per week: Not on file    Minutes per session: Not on file  . Stress: Not on file  Relationships  . Social connections:    Talks on phone: Not on file    Gets together: Not on file    Attends religious service: Not on file    Active member of club or organization: Not on file    Attends meetings of clubs or organizations: Not on file    Relationship status: Not on file  . Intimate partner violence:    Fear of current or ex partner: Not on file    Emotionally abused: Not on file    Physically abused: Not on  file    Forced sexual activity: Not on file  Other Topics Concern  . Not on file  Social History Narrative  . Not on file    Review of Systems: Gen: Denies fever, chills, loss of appetite, change in weight or weight loss CV: Denies chest pain, heart palpitations, syncope, edema  Resp: +SOB with exertion  GI: see HPI GU : Denies urinary burning, urinary frequency, urinary incontinence.  MS: Denies joint pain,swelling,  cramping Derm: Denies rash, itching, dry skin Psych: Denies depression, anxiety,confusion, or memory loss Heme: Denies bruising, bleeding, and enlarged lymph nodes.  Physical Exam: Vital signs in last 24 hours: Temp:  [97.7 F (36.5 C)-99 F (37.2 C)] 97.7 F (36.5 C) (06/08 1437) Pulse Rate:  [93-121] 117 (06/08 1437) Resp:  [16-32] 18 (06/08 1437) BP: (151-198)/(74-87) 193/87 (06/08 1437) SpO2:  [88 %-96 %] 95 % (06/08 1437) Weight:  [84 kg] 84 kg (06/08 0735) Last BM Date: 06/11/18 General:   Drowsy but awakens to verbal stimuli.  Head:  Normocephalic and atraumatic. Eyes:  Sclera clear, no icterus.   Conjunctiva pink. Ears:  Normal auditory acuity. Nose:  No deformity, discharge,  or lesions. Mouth:  Dry with cracked lips Lungs:  Clear throughout to auscultation.  Heart:  S1 S2 present without murmurs  Abdomen:  Soft, obese, TTP epigastric and RUQ. Due to body habitus, difficult to assess for HSM.   Rectal:  Deferred  Msk:  Symmetrical without gross deformities. Normal posture. Extremities:  Without  edema. Neurologic:  Alert and  oriented x4 Psych:  Alert and cooperative. Normal mood and affect.  Intake/Output from previous day: No intake/output data recorded. Intake/Output this shift: Total I/O In: 50 [IV Piggyback:50] Out: -   Lab Results: Recent Labs    06/12/18 0815  WBC 14.3*  HGB 17.0*  HCT 49.5*  PLT 223   BMET Recent Labs    06/12/18 0815  NA 136  K 3.3*  CL 94*  CO2 23  GLUCOSE 334*  BUN 21  CREATININE 0.90  CALCIUM  9.2   LFT Recent Labs    06/12/18 0815  PROT 6.9  ALBUMIN 4.0  AST 153*  ALT 219*  ALKPHOS 48  BILITOT 2.1*    Studies/Results: Dg Chest 2 View  Result Date: 06/12/2018 CLINICAL DATA:  Abdominal pain EXAM: CHEST - 2 VIEW COMPARISON:  None FINDINGS: Upper normal heart size. Mediastinal contours and pulmonary vascularity normal. Minimal bibasilar atelectasis. Lungs otherwise clear. No pleural effusion or pneumothorax. Atherosclerotic calcification aorta. Bones demineralized. IMPRESSION: Bibasilar atelectasis. Electronically Signed   By: Ulyses SouthwardMark  Boles M.D.   On: 06/12/2018 08:38   Ct Abdomen Pelvis W Contrast  Result Date: 06/12/2018 CLINICAL DATA:  Upper abdominal pain EXAM: CT ABDOMEN AND PELVIS WITH CONTRAST TECHNIQUE: Multidetector CT imaging of the abdomen and pelvis was performed using the standard protocol following bolus administration of intravenous contrast. CONTRAST:  100mL OMNIPAQUE 300 COMPARISON:  Ultrasound from earlier in the same day. FINDINGS: Lower chest: Bibasilar atelectasis right greater than left. Hepatobiliary: Decreased attenuation throughout the liver consistent with cirrhosis with mild associated ascites. The gallbladder is well distended. Pancreas: Pancreas is well visualized and demonstrates some cystic changes as well as peripancreatic inflammatory changes consistent with pancreatitis. Spleen: Spleen shows a small hypodensity likely representing hemangioma. Adrenals/Urinary Tract: The adrenal glands are within normal limits. Normal excretion of contrast is noted from the kidneys bilaterally. No renal calculi or obstructive changes are seen. The bladder is partially distended. Stomach/Bowel: Diverticulosis without evidence of diverticulitis. No obstructive or inflammatory changes of the large or small bowel are seen. The appendix is within normal limits. Vascular/Lymphatic: Heavy atherosclerotic calcifications are noted without aneurysmal dilatation. No lymphadenopathy is  seen. Reproductive: Uterus and bilateral adnexa are unremarkable. Other: No abdominal wall hernia or abnormality. No abdominopelvic ascites. Musculoskeletal: Degenerative changes of lumbar spine are noted. IMPRESSION: Changes consistent with pancreatitis and peripancreatic inflammatory change. Decreased attenuation of the liver consistent with underlying cirrhosis. Mild  ascites is noted which may be related to the cirrhosis. Bibasilar atelectasis right greater than left. Chronic changes as described above. Electronically Signed   By: Inez Catalina M.D.   On: 06/12/2018 12:41   US Abdomen Limited Ruq  Result Date: 06/12/2018 CLINICAL DATA:  Elevated LFTs.  Personal history of cirrhosis. EXAM: ULTRASOUND ABDOMEN LIMITED RIGHT UPPER QUADRANT COMPARISON:  None. FINDINGS: Gallbladder: No gallstones or wall thickening visualized. No sonographic Murphy sign noted by sonographer. Common bile duct: Diameter: 6.3 mm, upper limits of normal Liver: The liver is diffusely echogenic. Internal architecture is lost. No discrete lesions are present. Sensitivity is decreased due to hyperechoic parenchyma and body habitus. A small amount of free fluid is evident. Portal vein is patent on color Doppler imaging with normal direction of blood flow towards the liver. IMPRESSION: 1. Normal sonographic appearance of the gallbladder. 2. The liver is diffusely echogenic. Findings are consistent with the given diagnosis of cirrhosis. No discrete lesion is present. 3. Ascites. Electronically Signed   By: San Morelle M.D.   On: 06/12/2018 11:06    Impression: 78 year old female admitted with acute pancreatitis, associated elevated transaminases and bilirubin, no evidence for gallstones or acute cholecystitis. No CBD dilatation. Concern for microlithiasis remains. Lipid panel has been ordered and pending. Med effect less likely.   Dehydrated on admission, with 2 liters of NS this morning and 1 liter LR this afternoon. Tachycardic  in the 1teens currently. No known cardiac or renal underlying disease that would contraindicate aggressive IV fluid hydration.   Will need HFP tomorrow. If worsening transaminases and bilirubin, will need further imaging.   Plan: Repeat HFP in am Increase NS to 250 ml/hour for the next 12 hours, then decrease to 175 Monitor for signs/symptoms of fluid overload NPO  Concern for cirrhosis on imaging: likely secondary to fatty liver. Will need further outpatient evaluation for this Will follow closely and reassess in the morning Continue supportive measures  Annitta Needs, PhD, ANP-BC [facility]    LOS: 0 days    06/12/2018, 3:42 PM

## 2018-06-12 NOTE — ED Notes (Signed)
CRITICAL VALUE ALERT  Critical Value:  Lactic acid 3.6  Date & Time Notied:  1117 06/12/18  Provider Notified: Mcmanus  Orders Received/Actions taken:

## 2018-06-12 NOTE — ED Notes (Signed)
CRITICAL VALUE ALERT  Critical Value: lactic 4.0  Date & Time Notied:  6/8 0915  Provider Notified: mcmanus  Orders Received/Actions taken:

## 2018-06-12 NOTE — ED Notes (Signed)
RN notified of low oxygen saturation.

## 2018-06-12 NOTE — ED Notes (Signed)
Patient wants something to drink. Patient informed we had to wait for test results to come back first. Patient ask for something for pain. RN notified.

## 2018-06-12 NOTE — H&P (Signed)
History and Physical    Heather Galaslizabeth W Salmi FAO:[id number] DOB: 03/07/1940 DOA: 06/12/2018  PCP: Joette CatchingNyland, Leonard, MD   Patient coming from: Home  Chief Complaint: Abdominal pain with nausea and vomiting  HPI: Heather Oconnor is a 78 y.o. female with medical history significant for GERD, IBS, hemorrhoids, hypertension, type 2 diabetes, prior TIA, prior tobacco abuse, and obesity who presented with epigastric as well as some generalized abdominal pain that began earlier yesterday.  This was associated with intermittent episodes of nausea and vomiting and some questionable diarrhea.  She denies any hematemesis or dark stools or bloody stools.  She denies any fevers or chills and has no chest pain, shortness of breath, or cough.  She denies any radiation of the pain otherwise and has had nothing to eat or drink since yesterday morning.  She denies any new medications and denies alcohol use.   ED Course: Vital signs demonstrate some sinus tachycardia as well as elevated blood pressure readings.  Laboratory data with leukocytosis of 14,300, glucose 334, lactic acid 3.4, lipase 1169, T bili 2.1, AST 153, and ALT 219.  CT of the abdomen and pelvis was performed in the ED demonstrates signs of acute pancreatitis as well as some underlying liver cirrhosis.  Gallbladder is distended, but repeat imaging with right upper quadrant ultrasound with no biliary obstruction or other findings otherwise noted.  No signs of necrosis on pancreas noted.  She has been given 2 L IV fluid bolus and remains on 100 cc/h of normal saline.  She has been given multiple doses of morphine with minimal relief noted as well as some Zofran.  She states that her mouth is dry and she would like something to drink.  Review of Systems: All others reviewed and otherwise negative.  Past Medical History:  Diagnosis Date   Diabetes (HCC)    GERD (gastroesophageal reflux disease)    Hemorrhoids    Hypertension    IBS (irritable bowel  syndrome)     Past Surgical History:  Procedure Laterality Date   COLONOSCOPY  09/07/2007   RMR: anal canal/external hemorrhoids, redundant colon, left-sided diverticula, otherwise normal colonic mucosa     reports that she quit smoking about 11 years ago. She quit after 9.00 years of use. She has never used smokeless tobacco. She reports that she does not drink alcohol or use drugs.  No Known Allergies  Family History  Problem Relation Age of Onset   Colon cancer Neg Hx     Prior to Admission medications   Medication Sig Start Date End Date Taking? Authorizing Provider  amLODipine (NORVASC) 10 MG tablet TAKE 1 TABLET EVERY DAY 07/11/14   [provider]  aspirin 81 MG chewable tablet Chew 81 mg by mouth.    [provider]  brimonidine (ALPHAGAN) 0.2 % ophthalmic solution 2 (two) times daily.     [provider]  Budesonide (RHINOCORT ALLERGY NA) Place into the nose as needed.    [provider]  canagliflozin (INVOKANA) 300 MG TABS tablet TAKE ONE (1) TABLET EACH DAY 11/05/14   [provider]  clopidogrel (PLAVIX) 75 MG tablet TAKE 1 TABLET EVERY DAY 07/11/14   [provider]  dicyclomine (BENTYL) 20 MG tablet TAKE ONE TABLET FOUR TIMES A DAY BEFORE MEALS AND AT BEDTIME Patient not taking: Reported on 03/08/2017 03/25/15   Gelene MinkBoone, Anna W, NP  DORZOLAMIDE HCL-TIMOLOL MAL OP Apply to eye.    [provider]  Eluxadoline (VIBERZI) 100 MG TABS Take  1 tablet (100 mg total) by mouth daily. Patient not taking: Reported on 09/27/2017 04/04/17   Tiffany KocherLewis, Leslie S, PA-C  glipiZIDE (GLUCOTROL XL) 5 MG 24 hr tablet Take 1 tablet by mouth daily. 10/21/17   [provider]  hyoscyamine (LEVSIN/SL) 0.125 MG SL tablet Place 1 tablet (0.125 mg total) under the tongue every 4 (four) hours as needed. Patient not taking: Reported on 03/08/2017 05/05/15   Anice PaganiniGill, Eric A, NP  latanoprost (XALATAN) 0.005 % ophthalmic solution at bedtime. 10/24/17  10/24/18  [provider]  lisinopril-hydrochlorothiazide (PRINZIDE,ZESTORETIC) 20-12.5 MG tablet TAKE 1 TABLETS EVERY DAY 07/11/14   [provider]  loperamide (IMODIUM) 2 MG capsule Take 2 mg by mouth daily. Will take an additional if needed    [provider]  metFORMIN (GLUCOPHAGE) 500 MG tablet Take 500 mg by mouth 2 (two) times daily with a meal.    [provider]  metoprolol tartrate (LOPRESSOR) 25 MG tablet TAKE 1 TABLET EVERY DAY 07/11/14   [provider]  rosuvastatin (CRESTOR) 20 MG tablet Take 20 mg by mouth daily.    [provider]    Physical Exam: Vitals:   06/12/18 1000 06/12/18 1030 06/12/18 1234 06/12/18 1300  BP: (!) 152/75 (!) 151/76 (!) 198/81 (!) 189/77  Pulse: 93 93 (!) 121 (!) 121  Resp: (!) 29 (!) 26 18 (!) 31  Temp:      TempSrc:      SpO2: 91% 90% (!) 88% 93%  Weight:      Height:        Constitutional: Mildly distressed Vitals:   06/12/18 1000 06/12/18 1030 06/12/18 1234 06/12/18 1300  BP: (!) 152/75 (!) 151/76 (!) 198/81 (!) 189/77  Pulse: 93 93 (!) 121 (!) 121  Resp: (!) 29 (!) 26 18 (!) 31  Temp:      TempSrc:      SpO2: 91% 90% (!) 88% 93%  Weight:      Height:       Eyes: lids and conjunctivae normal ENMT: Mucous membranes are dry. Neck: normal, supple Respiratory: clear to auscultation bilaterally. Normal respiratory effort. No accessory muscle use.  Currently on nasal cannula oxygen. Cardiovascular: Regular rate and rhythm, no murmurs. No extremity edema.  Tachycardic Abdomen: Tenderness to gentle palpation over all quadrants, no distention. Bowel sounds positive.  Musculoskeletal:  No joint deformity upper and lower extremities.   Skin: no rashes, lesions, ulcers.  Psychiatric: Normal judgment and insight. Alert and oriented x 3. Normal mood.   Labs on Admission: I have personally reviewed following labs and imaging studies  CBC: Recent Labs  Lab 06/12/18 0815  WBC 14.3*    NEUTROABS 13.1*  HGB 17.0*  HCT 49.5*  MCV 84.9  PLT 223   Basic Metabolic Panel: Recent Labs  Lab 06/12/18 0815  NA 136  K 3.3*  CL 94*  CO2 23  GLUCOSE 334*  BUN 21  CREATININE 0.90  CALCIUM 9.2   GFR: Estimated Creatinine Clearance: 50.3 mL/min (by C-G formula based on SCr of 0.9 mg/dL). Liver Function Tests: Recent Labs  Lab 06/12/18 0815  AST 153*  ALT 219*  ALKPHOS 48  BILITOT 2.1*  PROT 6.9  ALBUMIN 4.0   Recent Labs  Lab 06/12/18 0815  LIPASE 1,169*   No results for input(s): AMMONIA in the last 168 hours. Coagulation Profile: No results for input(s): INR, PROTIME in the last 168 hours. Cardiac Enzymes: Recent Labs  Lab 06/12/18 0815  TROPONINI <0.03  BNP (last 3 results) No results for input(s): PROBNP in the last 8760 hours. HbA1C: No results for input(s): HGBA1C in the last 72 hours. CBG: No results for input(s): GLUCAP in the last 168 hours. Lipid Profile: No results for input(s): CHOL, HDL, LDLCALC, TRIG, CHOLHDL, LDLDIRECT in the last 72 hours. Thyroid Function Tests: No results for input(s): TSH, T4TOTAL, FREET4, T3FREE, THYROIDAB in the last 72 hours. Anemia Panel: No results for input(s): VITAMINB12, FOLATE, FERRITIN, TIBC, IRON, RETICCTPCT in the last 72 hours. Urine analysis:    Component Value Date/Time   COLORURINE AMBER (A) 06/12/2018 0738   APPEARANCEUR HAZY (A) 06/12/2018 0738   LABSPEC 1.038 (H) 06/12/2018 0738   PHURINE 5.0 06/12/2018 0738   GLUCOSEU 150 (A) 06/12/2018 0738   HGBUR NEGATIVE 06/12/2018 0738   BILIRUBINUR SMALL (A) 06/12/2018 0738   KETONESUR NEGATIVE 06/12/2018 0738   PROTEINUR 100 (A) 06/12/2018 0738   NITRITE NEGATIVE 06/12/2018 0738   LEUKOCYTESUR NEGATIVE 06/12/2018 0738    Radiological Exams on Admission: Dg Chest 2 View  Result Date: 06/12/2018 CLINICAL DATA:  Abdominal pain EXAM: CHEST - 2 VIEW COMPARISON:  None FINDINGS: Upper normal heart size. Mediastinal contours and pulmonary  vascularity normal. Minimal bibasilar atelectasis. Lungs otherwise clear. No pleural effusion or pneumothorax. Atherosclerotic calcification aorta. Bones demineralized. IMPRESSION: Bibasilar atelectasis. Electronically Signed   By: Ulyses SouthwardMark  Boles M.D.   On: 06/12/2018 08:38   Ct Abdomen Pelvis W Contrast  Result Date: 06/12/2018 CLINICAL DATA:  Upper abdominal pain EXAM: CT ABDOMEN AND PELVIS WITH CONTRAST TECHNIQUE: Multidetector CT imaging of the abdomen and pelvis was performed using the standard protocol following bolus administration of intravenous contrast. CONTRAST:  100mL OMNIPAQUE 300 COMPARISON:  Ultrasound from earlier in the same day. FINDINGS: Lower chest: Bibasilar atelectasis right greater than left. Hepatobiliary: Decreased attenuation throughout the liver consistent with cirrhosis with mild associated ascites. The gallbladder is well distended. Pancreas: Pancreas is well visualized and demonstrates some cystic changes as well as peripancreatic inflammatory changes consistent with pancreatitis. Spleen: Spleen shows a small hypodensity likely representing hemangioma. Adrenals/Urinary Tract: The adrenal glands are within normal limits. Normal excretion of contrast is noted from the kidneys bilaterally. No renal calculi or obstructive changes are seen. The bladder is partially distended. Stomach/Bowel: Diverticulosis without evidence of diverticulitis. No obstructive or inflammatory changes of the large or small bowel are seen. The appendix is within normal limits. Vascular/Lymphatic: Heavy atherosclerotic calcifications are noted without aneurysmal dilatation. No lymphadenopathy is seen. Reproductive: Uterus and bilateral adnexa are unremarkable. Other: No abdominal wall hernia or abnormality. No abdominopelvic ascites. Musculoskeletal: Degenerative changes of lumbar spine are noted. IMPRESSION: Changes consistent with pancreatitis and peripancreatic inflammatory change. Decreased attenuation of the  liver consistent with underlying cirrhosis. Mild ascites is noted which may be related to the cirrhosis. Bibasilar atelectasis right greater than left. Chronic changes as described above. Electronically Signed   By: Alcide CleverMark  Lukens M.D.   On: 06/12/2018 12:41   [place] Abdomen Limited Ruq  Result Date: 06/12/2018 CLINICAL DATA:  Elevated LFTs.  Personal history of cirrhosis. EXAM: ULTRASOUND ABDOMEN LIMITED RIGHT UPPER QUADRANT COMPARISON:  None. FINDINGS: Gallbladder: No gallstones or wall thickening visualized. No sonographic Murphy sign noted by sonographer. Common bile duct: Diameter: 6.3 mm, upper limits of normal Liver: The liver is diffusely echogenic. Internal architecture is lost. No discrete lesions are present. Sensitivity is decreased due to hyperechoic parenchyma and body habitus. A small amount of free fluid is evident. Portal vein is patent on color Doppler imaging  with normal direction of blood flow towards the liver. IMPRESSION: 1. Normal sonographic appearance of the gallbladder. 2. The liver is diffusely echogenic. Findings are consistent with the given diagnosis of cirrhosis. No discrete lesion is present. 3. Ascites. Electronically Signed   By: San Morelle M.D.   On: 06/12/2018 11:06    EKG: Independently reviewed. SR 93 bpm.  Assessment/Plan Principal Problem:   Acute pancreatitis Active Problems:   Essential hypertension   Transient cerebral ischemia   GERD   Irritable bowel syndrome   Type 2 diabetes mellitus without complication (HCC)    Acute pancreatitis-unknown etiology -Patient denies any alcohol use or prior episodes; appreciate GI consultation for assistance in management -Check lipid panel  -Trend CMP as well as lipase -Maintain on aggressive IV fluid hydration with repeat 1 L bolus of IV fluid -N.p.o. except some sips and ice chips -Pain management with IV Dilaudid as needed -Zofran for nausea and vomiting  Essential hypertension with elevated blood  pressure readings -This is likely secondary to pain above and will treat with Dilaudid as noted -Hydralazine as needed for severe elevations -Tachycardia likely secondary to volume deficit and will bolus another liter fluid  Lactic acidosis -Likely secondary to dehydration -Repeat in a.m. and bolus further IV fluid and maintain on aggressive rate  Transaminitis -Recheck CMP in a.m. and trend -No signs of obstruction currently noted and this is likely related to pancreatitis  IBS with diarrhea -Appreciate GI evaluation -Does not appear to be infectious etiology  Type 2 diabetes with hyperglycemia -Avoid home medications -SSI every 4 hours  GERD -IV PPI daily  Prior TIA -Hold aspirin, Plavix, and statin for now   DVT prophylaxis: Lovenox Code Status: Full Family Communication: Will call husband to update Disposition Plan: Admit for evaluation and management of acute pancreatitis Consults called: GI Admission status: Inpatient, MedSurg   Brailen Macneal Darleen Crocker DO [facility] Pager [phone]  If 7PM-7AM, please contact night-coverage [email or web address] Password [email or web address]  06/12/2018, 1:30 PM

## 2018-06-12 NOTE — ED Triage Notes (Signed)
Pt reports upper abd pain starting yesterday progressing to generalized pain with vomiting and diarrhea.

## 2018-06-12 NOTE — ED Notes (Signed)
o2 2 L applied via 

## 2018-06-12 NOTE — ED Provider Notes (Signed)
[facility] EMERGENCY DEPARTMENT Provider Note   CSN: [id number] Arrival date & time: 06/12/18  0720    History   Chief Complaint Chief Complaint  Patient presents with  . Abdominal Pain    HPI Heather Oconnor is a 78 y.o. female.     HPI  Pt was seen at 0730.  Per pt, c/o gradual onset and persistence of constant generalized abd "pain" since yesterday.  Has been associated with multiple intermittent episodes of N/V and one large episode of "diarrhea."  Denies fevers, no cough, no SOB. Denies back pain, no rash, no CP/SOB, no black or blood in stools or emesis.      Past Medical History:  Diagnosis Date  . Diabetes (HCC)   . GERD (gastroesophageal reflux disease)   . Hemorrhoids   . Hypertension   . IBS (irritable bowel syndrome)     Patient Active Problem List   Diagnosis Date Noted  . Irritable bowel syndrome 02/04/2015  . Abdominal pain 11/27/2014  . Diarrhea 11/27/2014  . FECAL URGENCY 07/24/2009  . SMOKER 05/08/2008  . HYPERTENSION 05/08/2008  . TIA 05/08/2008  . INTERNAL HEMORRHOIDS 05/08/2008  . HEMORRHOIDS, EXTERNAL 05/08/2008  . EROSIVE ESOPHAGITIS 05/08/2008  . GERD 05/08/2008  . HIATAL HERNIA 05/08/2008  . PROLAPSE, RECTAL 05/08/2008  . FECAL INCONTINENCE 05/08/2008  . COLONIC POLYPS, ADENOMATOUS, HX OF 05/08/2008    Past Surgical History:  Procedure Laterality Date  . COLONOSCOPY  09/07/2007   RMR: anal canal/external hemorrhoids, redundant colon, left-sided diverticula, otherwise normal colonic mucosa     OB History   No obstetric history on file.      Home Medications    Prior to Admission medications   Medication Sig Start Date End Date Taking? Authorizing Provider  amLODipine (NORVASC) 10 MG tablet TAKE 1 TABLET EVERY DAY 07/11/14   [provider]  aspirin 81 MG chewable tablet Chew 81 mg by mouth.    [provider]  brimonidine (ALPHAGAN) 0.2 % ophthalmic solution 2 (two) times daily.     [provider]   Budesonide (RHINOCORT ALLERGY NA) Place into the nose as needed.    [provider]  canagliflozin (INVOKANA) 300 MG TABS tablet TAKE ONE (1) TABLET EACH DAY 11/05/14   [provider]  clopidogrel (PLAVIX) 75 MG tablet TAKE 1 TABLET EVERY DAY 07/11/14   [provider]  dicyclomine (BENTYL) 20 MG tablet TAKE ONE TABLET FOUR TIMES A DAY BEFORE MEALS AND AT BEDTIME Patient not taking: Reported on 03/08/2017 03/25/15   Gelene Mink, NP  DORZOLAMIDE HCL-TIMOLOL MAL OP Apply to eye.    [provider]  Eluxadoline (VIBERZI) 100 MG TABS Take 1 tablet (100 mg total) by mouth daily. Patient not taking: Reported on 09/27/2017 04/04/17   Tiffany Kocher, PA-C  glipiZIDE (GLUCOTROL XL) 5 MG 24 hr tablet Take 1 tablet by mouth daily. 10/21/17   [provider]  hyoscyamine (LEVSIN/SL) 0.125 MG SL tablet Place 1 tablet (0.125 mg total) under the tongue every 4 (four) hours as needed. Patient not taking: Reported on 03/08/2017 05/05/15   Anice Paganini, NP  latanoprost (XALATAN) 0.005 % ophthalmic solution at bedtime. 10/24/17 10/24/18  [provider]  lisinopril-hydrochlorothiazide (PRINZIDE,ZESTORETIC) 20-12.5 MG tablet TAKE 1 TABLETS EVERY DAY 07/11/14   [provider]  loperamide (IMODIUM) 2 MG capsule Take 2 mg by mouth daily. Will take an additional if needed    [provider]  metFORMIN (GLUCOPHAGE) 500 MG tablet Take  500 mg by mouth 2 (two) times daily with a meal.    [provider]  metoprolol tartrate (LOPRESSOR) 25 MG tablet TAKE 1 TABLET EVERY DAY 07/11/14   [provider]  rosuvastatin (CRESTOR) 20 MG tablet Take 20 mg by mouth daily.    [provider]    Family History Family History  Problem Relation Age of Onset  . Colon cancer Neg Hx     Social History Social History   Tobacco Use  . Smoking status: Former Smoker    Years: 9.00    Last attempt to quit: 11/27/2006    Years since quitting: 11.5   . Smokeless tobacco: Never Used  Substance Use Topics  . Alcohol use: No    Alcohol/week: 0.0 standard drinks  . Drug use: No     Allergies   Patient has no known allergies.   Review of Systems Review of Systems ROS: Statement: All systems negative except as marked or noted in the HPI; Constitutional: Negative for fever and chills. ; ; Eyes: Negative for eye pain, redness and discharge. ; ; ENMT: Negative for ear pain, hoarseness, nasal congestion, sinus pressure and sore throat. ; ; Cardiovascular: Negative for chest pain, palpitations, diaphoresis, dyspnea and peripheral edema. ; ; Respiratory: Negative for cough, wheezing and stridor. ; ; Gastrointestinal: +N/V/D, abd pain. Negative for blood in stool, hematemesis, jaundice and rectal bleeding. . ; ; Genitourinary: Negative for dysuria, flank pain and hematuria. ; ; Musculoskeletal: Negative for back pain and neck pain. Negative for swelling and trauma.; ; Skin: Negative for pruritus, rash, abrasions, blisters, bruising and skin lesion.; ; Neuro: Negative for headache, lightheadedness and neck stiffness. Negative for weakness, altered level of consciousness, altered mental status, extremity weakness, paresthesias, involuntary movement, seizure and syncope.       Physical Exam Updated Vital Signs BP (!) 180/85 (BP Location: Right Arm)   Pulse 100   Temp 98.8 F (37.1 C) (Oral)   Resp (!) 32   Ht 5' (1.524 m)   Wt 84 kg   SpO2 96%   BMI 36.17 kg/m    Patient Vitals for the past 24 hrs:  BP Temp Temp src Pulse Resp SpO2 Height Weight  06/12/18 1300 (!) 189/77 - - (!) 121 (!) 31 93 % - -  06/12/18 1234 (!) 198/81 - - (!) 121 18 (!) 88 % - -  06/12/18 1030 (!) 151/76 - - 93 (!) 26 90 % - -  06/12/18 1000 (!) 152/75 - - 93 (!) 29 91 % - -  06/12/18 0955 (!) 164/79 - - 93 16 93 % - -  06/12/18 0930 (!) 164/79 - - 96 (!) 29 92 % - -  06/12/18 0736 (!) 180/85 98.8 F (37.1 C) Oral 100 (!) 32 96 % - -  06/12/18 0735 - - - - - - 5'  (1.524 m) 84 kg     Physical Exam 0715: Physical examination:  Nursing notes reviewed; Vital signs and O2 SAT reviewed;  Constitutional: Well developed, Well nourished, Well hydrated, Uncomfortable appearing, moaning.; Head:  Normocephalic, atraumatic; Eyes: EOMI, PERRL, No scleral icterus; ENMT: Mouth and pharynx normal, Mucous membranes moist; Neck: Supple, Full range of motion, No lymphadenopathy; Cardiovascular: Tachycardic rate and rhythm, No gallop; Respiratory: Breath sounds clear & equal bilaterally, No wheezes.  Speaking full sentences with ease, intermittently hyperventilating. Normal respiratory effort/excursion; Chest: Nontender, Movement normal; Abdomen: Soft, +diffuse tenderness to palp. No rebound or guarding. Nondistended, Normal bowel sounds; Genitourinary: No  CVA tenderness; Extremities: Peripheral pulses palp and normal, No tenderness, No edema, No calf edema or asymmetry.; Neuro: AA&Ox3, Major CN grossly intact.  Speech clear. No gross focal motor deficits in extremities.; Skin: Color normal, Warm, Dry.   ED Treatments / Results  Labs (all labs ordered are listed, but only abnormal results are displayed)   EKG EKG Interpretation  Date/Time:  Monday June 12 2018 07:45:19 EDT Ventricular Rate:  93 PR Interval:    QRS Duration: 85 QT Interval:  371 QTC Calculation: 462 R Axis:   62 Text Interpretation:  Sinus rhythm Probable anterior infarct, age indeterminate Nonspecific T wave abnormality No old tracing to compare Confirmed by Samuel JesterMcManus, [place]essa Nishi [phone]) on 06/12/2018 9:23:12 AM   Radiology   Procedures Procedures (including critical care time)  Medications Ordered in ED Medications  0.9 %  sodium chloride infusion (has no administration in time range)  ondansetron (ZOFRAN) injection 4 mg (has no administration in time range)  morphine 4 MG/ML injection 4 mg (has no administration in time range)  famotidine (PEPCID) IVPB 20 mg premix (has no administration in time  range)     Initial Impression / Assessment and Plan / ED Course  I have reviewed the triage vital signs and the nursing notes.  Pertinent labs & imaging results that were available during my care of the patient were reviewed by me and considered in my medical decision making (see chart for details).     MDM Reviewed: previous chart, nursing note and vitals Reviewed previous: labs and ECG Interpretation: labs, ECG, x-ray and CT scan    Results for orders placed or performed during the hospital encounter of 06/12/18  SARS Coronavirus 2 (CEPHEID - [facility]  Result Value Ref Range   SARS Coronavirus 2 NEGATIVE NEGATIVE  Comprehensive metabolic panel  Result Value Ref Range   Sodium 136 135 - 145 mmol/L   Potassium 3.3 (L) 3.5 - 5.1 mmol/L   Chloride 94 (L) 98 - 111 mmol/L   CO2 23 22 - 32 mmol/L   Glucose, Bld 334 (H) 70 - 99 mg/dL   BUN 21 8 - 23 mg/dL   Creatinine, Ser 6.040.90 0.44 - 1.00 mg/dL   Calcium 9.2 8.9 - 54.010.3 mg/dL   Total Protein 6.9 6.5 - 8.1 g/dL   Albumin 4.0 3.5 - 5.0 g/dL   AST 981153 (H) 15 - 41 U/L   ALT 219 (H) 0 - 44 U/L   Alkaline Phosphatase 48 38 - 126 U/L   Total Bilirubin 2.1 (H) 0.3 - 1.2 mg/dL   GFR calc non Af Amer >60 >60 mL/min   GFR calc Af Amer >60 >60 mL/min   Anion gap 19 (H) 5 - 15  Lipase, blood  Result Value Ref Range   Lipase 1,169 (H) 11 - 51 U/L  Troponin I - Once  Result Value Ref Range   Troponin I <0.03 <0.03 ng/mL  CBC with Differential  Result Value Ref Range   WBC 14.3 (H) 4.0 - 10.5 K/uL   RBC 5.83 (H) 3.87 - 5.11 MIL/uL   Hemoglobin 17.0 (H) 12.0 - 15.0 g/dL   HCT 19.149.5 (H) 47.836.0 - 29.546.0 %   MCV 84.9 80.0 - 100.0 fL   MCH 29.2 26.0 - 34.0 pg   MCHC 34.3 30.0 - 36.0 g/dL   RDW 62.114.2 30.811.5 - 65.715.5 %   Platelets 223 150 - 400 K/uL   nRBC 0.0 0.0 - 0.2 %  Neutrophils Relative % 92 %   Neutro Abs 13.1 (H) 1.7 - 7.7 K/uL   Lymphocytes Relative 4 %   Lymphs Abs 0.5 (L) 0.7 - 4.0 K/uL    Monocytes Relative 4 %   Monocytes Absolute 0.5 0.1 - 1.0 K/uL   Eosinophils Relative 0 %   Eosinophils Absolute 0.0 0.0 - 0.5 K/uL   Basophils Relative 0 %   Basophils Absolute 0.0 0.0 - 0.1 K/uL   Immature Granulocytes 0 %   Abs Immature Granulocytes 0.05 0.00 - 0.07 K/uL  Lactic acid, plasma  Result Value Ref Range   Lactic Acid, Venous 4.0 (HH) 0.5 - 1.9 mmol/L  Lactic acid, plasma  Result Value Ref Range   Lactic Acid, Venous 3.6 (HH) 0.5 - 1.9 mmol/L  Urinalysis, Routine w reflex microscopic  Result Value Ref Range   Color, Urine AMBER (A) YELLOW   APPearance HAZY (A) CLEAR   Specific Gravity, Urine 1.038 (H) 1.005 - 1.030   pH 5.0 5.0 - 8.0   Glucose, UA 150 (A) NEGATIVE mg/dL   Hgb urine dipstick NEGATIVE NEGATIVE   Bilirubin Urine SMALL (A) NEGATIVE   Ketones, ur NEGATIVE NEGATIVE mg/dL   Protein, ur 100 (A) NEGATIVE mg/dL   Nitrite NEGATIVE NEGATIVE   Leukocytes,Ua NEGATIVE NEGATIVE   RBC / HPF 0-5 0 - 5 RBC/hpf   WBC, UA 6-10 0 - 5 WBC/hpf   Bacteria, UA RARE (A) NONE SEEN   Squamous Epithelial / LPF 6-10 0 - 5   Mucus PRESENT    Hyaline Casts, UA PRESENT    Dg Chest 2 View Result Date: 06/12/2018 CLINICAL DATA:  Abdominal pain EXAM: CHEST - 2 VIEW COMPARISON:  None FINDINGS: Upper normal heart size. Mediastinal contours and pulmonary vascularity normal. Minimal bibasilar atelectasis. Lungs otherwise clear. No pleural effusion or pneumothorax. Atherosclerotic calcification aorta. Bones demineralized. IMPRESSION: Bibasilar atelectasis. Electronically Signed   By: Lavonia Dana M.D.   On: 06/12/2018 08:38   Ct Abdomen Pelvis W Contrast Result Date: 06/12/2018 CLINICAL DATA:  Upper abdominal pain EXAM: CT ABDOMEN AND PELVIS WITH CONTRAST TECHNIQUE: Multidetector CT imaging of the abdomen and pelvis was performed using the standard protocol following bolus administration of intravenous contrast. CONTRAST:  129mL OMNIPAQUE 300 COMPARISON:  Ultrasound from earlier in the same  day. FINDINGS: Lower chest: Bibasilar atelectasis right greater than left. Hepatobiliary: Decreased attenuation throughout the liver consistent with cirrhosis with mild associated ascites. The gallbladder is well distended. Pancreas: Pancreas is well visualized and demonstrates some cystic changes as well as peripancreatic inflammatory changes consistent with pancreatitis. Spleen: Spleen shows a small hypodensity likely representing hemangioma. Adrenals/Urinary Tract: The adrenal glands are within normal limits. Normal excretion of contrast is noted from the kidneys bilaterally. No renal calculi or obstructive changes are seen. The bladder is partially distended. Stomach/Bowel: Diverticulosis without evidence of diverticulitis. No obstructive or inflammatory changes of the large or small bowel are seen. The appendix is within normal limits. Vascular/Lymphatic: Heavy atherosclerotic calcifications are noted without aneurysmal dilatation. No lymphadenopathy is seen. Reproductive: Uterus and bilateral adnexa are unremarkable. Other: No abdominal wall hernia or abnormality. No abdominopelvic ascites. Musculoskeletal: Degenerative changes of lumbar spine are noted. IMPRESSION: Changes consistent with pancreatitis and peripancreatic inflammatory change. Decreased attenuation of the liver consistent with underlying cirrhosis. Mild ascites is noted which may be related to the cirrhosis. Bibasilar atelectasis right greater than left. Chronic changes as described above. Electronically Signed   By: Inez Catalina M.D.   On: 06/12/2018 12:41  [place] Abdomen Limited Ruq Result Date: 06/12/2018 CLINICAL DATA:  Elevated LFTs.  Personal history of cirrhosis. EXAM: ULTRASOUND ABDOMEN LIMITED RIGHT UPPER QUADRANT COMPARISON:  None. FINDINGS: Gallbladder: No gallstones or wall thickening visualized. No sonographic Murphy sign noted by sonographer. Common bile duct: Diameter: 6.3 mm, upper limits of normal Liver: The liver is diffusely  echogenic. Internal architecture is lost. No discrete lesions are present. Sensitivity is decreased due to hyperechoic parenchyma and body habitus. A small amount of free fluid is evident. Portal vein is patent on color Doppler imaging with normal direction of blood flow towards the liver. IMPRESSION: 1. Normal sonographic appearance of the gallbladder. 2. The liver is diffusely echogenic. Findings are consistent with the given diagnosis of cirrhosis. No discrete lesion is present. 3. Ascites. Electronically Signed   By: Marin Robertshristopher  Mattern M.D.   On: 06/12/2018 11:06     1315:  Labs reveal acute pancreatitis which is c/w CT as above. Pt continues to c/o pain; meds re-dosed. No fever while in the ED. Dx and testing d/w pt.  Questions answered.  Verb understanding, agreeable to admit.   T/C returned from [facility] Dr. Sherryll BurgerShah, case discussed, including:  HPI, pertinent PM/SHx, VS/PE, dx testing, ED course and treatment:  Agreeable to admit.     Final Clinical Impressions(s) / ED Diagnoses   Final diagnoses:  None    ED Discharge Orders    None       Samuel JesterMcManus, Dontavius Keim, DO 06/16/18 1643

## 2018-06-13 ENCOUNTER — Inpatient Hospital Stay (HOSPITAL_COMMUNITY): Payer: Medicare HMO

## 2018-06-13 DIAGNOSIS — R0600 Dyspnea, unspecified: Secondary | ICD-10-CM

## 2018-06-13 DIAGNOSIS — K8500 Idiopathic acute pancreatitis without necrosis or infection: Secondary | ICD-10-CM

## 2018-06-13 LAB — CBC
HCT: 44.7 % (ref 36.0–46.0)
Hemoglobin: 14.2 g/dL (ref 12.0–15.0)
MCH: 29 pg (ref 26.0–34.0)
MCHC: 31.8 g/dL (ref 30.0–36.0)
MCV: 91.4 fL (ref 80.0–100.0)
Platelets: 186 10*3/uL (ref 150–400)
RBC: 4.89 MIL/uL (ref 3.87–5.11)
RDW: 14.8 % (ref 11.5–15.5)
WBC: 12.7 10*3/uL — ABNORMAL HIGH (ref 4.0–10.5)
nRBC: 0 % (ref 0.0–0.2)

## 2018-06-13 LAB — BASIC METABOLIC PANEL
Anion gap: 10 (ref 5–15)
BUN: 23 mg/dL (ref 8–23)
CO2: 23 mmol/L (ref 22–32)
Calcium: 7.8 mg/dL — ABNORMAL LOW (ref 8.9–10.3)
Chloride: 107 mmol/L (ref 98–111)
Creatinine, Ser: 0.73 mg/dL (ref 0.44–1.00)
GFR calc Af Amer: >60 mL/min (ref >60)
GFR calc non Af Amer: >60 mL/min (ref >60)
Glucose, Bld: 166 mg/dL — ABNORMAL HIGH (ref 70–99)
Potassium: 3.7 mmol/L (ref 3.5–5.1)
Sodium: 140 mmol/L (ref 135–145)

## 2018-06-13 LAB — GLUCOSE, CAPILLARY
Glucose-Capillary: 131 mg/dL — ABNORMAL HIGH (ref 70–99)
Glucose-Capillary: 135 mg/dL — ABNORMAL HIGH (ref 70–99)
Glucose-Capillary: 148 mg/dL — ABNORMAL HIGH (ref 70–99)
Glucose-Capillary: 156 mg/dL — ABNORMAL HIGH (ref 70–99)
Glucose-Capillary: 148 mg/dL — ABNORMAL HIGH (ref 70–99)
Glucose-Capillary: 126 mg/dL — ABNORMAL HIGH (ref 70–99)
Glucose-Capillary: 113 mg/dL — ABNORMAL HIGH (ref 70–99)

## 2018-06-13 LAB — HEPATIC FUNCTION PANEL
ALT: 103 U/L — ABNORMAL HIGH (ref 0–44)
AST: 46 U/L — ABNORMAL HIGH (ref 15–41)
Albumin: 3.1 g/dL — ABNORMAL LOW (ref 3.5–5.0)
Alkaline Phosphatase: 39 U/L (ref 38–126)
Bilirubin, Direct: 0.4 mg/dL — ABNORMAL HIGH (ref 0.0–0.2)
Indirect Bilirubin: 1 mg/dL — ABNORMAL HIGH (ref 0.3–0.9)
Total Bilirubin: 1.4 mg/dL — ABNORMAL HIGH (ref 0.3–1.2)
Total Protein: 6 g/dL — ABNORMAL LOW (ref 6.5–8.1)

## 2018-06-13 LAB — URINE CULTURE: Culture: NO GROWTH

## 2018-06-13 LAB — LIPASE, BLOOD: Lipase: 164 U/L — ABNORMAL HIGH (ref 11–51)

## 2018-06-13 LAB — MAGNESIUM: Magnesium: 1.6 mg/dL — ABNORMAL LOW (ref 1.7–2.4)

## 2018-06-13 LAB — ECHOCARDIOGRAM COMPLETE
Height: 60 in
Weight: 2962.98 oz

## 2018-06-13 MED ORDER — SODIUM CHLORIDE 0.9 % IV SOLN
INTRAVENOUS | Status: DC
  Administered 2018-06-13 (×2): via INTRAVENOUS

## 2018-06-13 MED ORDER — AMLODIPINE BESYLATE 5 MG PO TABS
10.0000 mg | ORAL_TABLET | Freq: Every day | ORAL | Status: DC
  Administered 2018-06-13 – 2018-06-18 (×6): 10 mg via ORAL
  Filled 2018-06-13 (×6): qty 2

## 2018-06-13 MED ORDER — LORAZEPAM 2 MG/ML IJ SOLN
0.5000 mg | Freq: Once | INTRAMUSCULAR | Status: AC
  Administered 2018-06-13: 0.5 mg via INTRAVENOUS
  Filled 2018-06-13: qty 1

## 2018-06-13 MED ORDER — ASPIRIN 81 MG PO CHEW
81.0000 mg | CHEWABLE_TABLET | Freq: Every day | ORAL | Status: DC
  Administered 2018-06-13 – 2018-06-18 (×6): 81 mg via ORAL
  Filled 2018-06-13 (×6): qty 1

## 2018-06-13 MED ORDER — FUROSEMIDE 10 MG/ML IJ SOLN
60.0000 mg | Freq: Once | INTRAMUSCULAR | Status: AC
  Administered 2018-06-13: 60 mg via INTRAVENOUS
  Filled 2018-06-13: qty 6

## 2018-06-13 MED ORDER — CLOPIDOGREL BISULFATE 75 MG PO TABS
75.0000 mg | ORAL_TABLET | Freq: Every day | ORAL | Status: DC
  Administered 2018-06-13 – 2018-06-15 (×3): 75 mg via ORAL
  Filled 2018-06-13 (×3): qty 1

## 2018-06-13 MED ORDER — METOPROLOL SUCCINATE ER 50 MG PO TB24
100.0000 mg | ORAL_TABLET | Freq: Every day | ORAL | Status: DC
  Administered 2018-06-13 – 2018-06-18 (×6): 100 mg via ORAL
  Filled 2018-06-13 (×6): qty 2

## 2018-06-13 MED ORDER — LEVALBUTEROL HCL 0.63 MG/3ML IN NEBU
0.6300 mg | INHALATION_SOLUTION | Freq: Four times a day (QID) | RESPIRATORY_TRACT | Status: DC | PRN
  Administered 2018-06-13 – 2018-06-17 (×3): 0.63 mg via RESPIRATORY_TRACT
  Filled 2018-06-13 (×3): qty 3

## 2018-06-13 MED ORDER — PANTOPRAZOLE SODIUM 40 MG PO TBEC
40.0000 mg | DELAYED_RELEASE_TABLET | Freq: Every day | ORAL | Status: DC
  Administered 2018-06-13 – 2018-06-18 (×6): 40 mg via ORAL
  Filled 2018-06-13 (×6): qty 1

## 2018-06-13 MED ORDER — MAGNESIUM SULFATE 2 GM/50ML IV SOLN
2.0000 g | Freq: Once | INTRAVENOUS | Status: AC
  Administered 2018-06-13: 2 g via INTRAVENOUS

## 2018-06-13 NOTE — Progress Notes (Signed)
Tylene Fantasia, NP returned my call, orders received.  P.J. Linus Mako, RN

## 2018-06-13 NOTE — Progress Notes (Signed)
RN paged Tylene Fantasia, NP to make her aware patient is very SOB, c/o dyspnea at rest, resps 28, BP 172/73, O2 sat 94% on O2 2 l/m per [place], lung sounds diminished. RN awaiting response from NP, will continue to monitor patient and make provider aware of changes as necessary.  P.J. Linus Mako, RN

## 2018-06-13 NOTE — Progress Notes (Signed)
RN paged Tylene Fantasia, NP with results of CXR, awaiting response.  P.J. Linus Mako, RN

## 2018-06-13 NOTE — Progress Notes (Signed)
PROGRESS NOTE    Heather Oconnor  JXB:[id number] DOB: 07/05/1940 DOA: 06/12/2018 PCP: Joette CatchingNyland, Leonard, MD   Brief Narrative:  Per HPI: Heather Oconnor is a 78 y.o. female with medical history significant for GERD, IBS, hemorrhoids, hypertension, type 2 diabetes, prior TIA, prior tobacco abuse, and obesity who presented with epigastric as well as some generalized abdominal pain that began earlier yesterday.  This was associated with intermittent episodes of nausea and vomiting and some questionable diarrhea.  She denies any hematemesis or dark stools or bloody stools.  She denies any fevers or chills and has no chest pain, shortness of breath, or cough.  She denies any radiation of the pain otherwise and has had nothing to eat or drink since yesterday morning.  She denies any new medications and denies alcohol use.   Assessment & Plan:   Principal Problem:   Acute pancreatitis Active Problems:   Essential hypertension   Transient cerebral ischemia   GERD   Irritable bowel syndrome   Type 2 diabetes mellitus without complication (HCC)   Elevated LFTs   Acute pancreatitis-unknown etiology -Patient denies any alcohol use or prior episodes; appreciate GI consultation for assistance in management -Lipid panel without any significant triglyceride elevation -Trend CMP as well as lipase which are improving -Maintain on aggressive IV fluid hydration  -Advance clear liquid diet -Pain management with IV Dilaudid as needed -Zofran for nausea and vomiting which has improved  Essential hypertension with elevated blood pressure readings-improving -This is likely secondary to pain above and will treat with Dilaudid as noted -Hydralazine as needed for severe elevations -Tachycardia likely secondary to volume deficit and will bolus another liter fluid -Check 2D echocardiogram as patient has had worsening dyspnea on exertion at home over the last 3 to 4 months  Lactic acidosis -Likely  secondary to dehydration -Repeat in a.m. to monitor  Transaminitis-downtrending -Recheck CMP in a.m. and trend -No signs of obstruction currently noted and this is likely related to pancreatitis  IBS with diarrhea -Appreciate GI evaluation -Does not appear to be infectious etiology  Type 2 diabetes with hyperglycemia-improved -Avoid home medications -SSI qAC and qHS before meals and nightly no  GERD -PO Protonix  Prior TIA -Hold statin for now -Restart home aspirin and Plavix   DVT prophylaxis: Lovenox Code Status: Full Family Communication: Updated husband on phone on 6/9 Disposition Plan: Continue management of acute pancreatitis with aggressive IV fluids as well as pain management.  Diet advanced to clear liquids today.  2D echocardiogram ordered today for dyspnea on exertion evaluation.   Consultants:   GI  Procedures:   None  Antimicrobials:   None   Subjective: Patient seen and evaluated today with no new acute complaints or concerns. No acute concerns or events noted overnight.  She is asking for clear liquid diet as her mouth is quite dry.  Objective: Vitals:   06/12/18 1437 06/12/18 2003 06/13/18 0613 06/13/18 0659  BP: (!) 193/87  (!) 183/80 (!) 167/75  Pulse: (!) 117  (!) 126   Resp: 18  (!) 21   Temp: 97.7 F (36.5 C)  97.6 F (36.4 C)   TempSrc: Oral  Oral   SpO2: 95% 92% 92%   Weight:      Height:        Intake/Output Summary (Last 24 hours) at 06/13/2018 1146 Last data filed at 06/13/2018 0900 Gross per 24 hour  Intake 2860.35 ml  Output -  Net 2860.35 ml   [employer]  06/12/18 0735  Weight: 84 kg    Examination:  General exam: Appears calm and comfortable  Respiratory system: Clear to auscultation. Respiratory effort normal.  Currently on nasal cannula oxygen Cardiovascular system: S1 & S2 heard, RRR. No JVD, murmurs, rubs, gallops or clicks. No pedal edema.  Tachycardic Gastrointestinal system: Abdomen is  nondistended, soft and nontender. No organomegaly or masses felt. Normal bowel sounds heard. Central nervous system: Alert and oriented. No focal neurological deficits. Extremities: Symmetric 5 x 5 power. Skin: No rashes, lesions or ulcers Psychiatry: Mildly anxious    Data Reviewed: I have personally reviewed following labs and imaging studies  CBC: Recent Labs  Lab 06/12/18 0815 06/13/18 0634  WBC 14.3* 12.7*  NEUTROABS 13.1*  --   HGB 17.0* 14.2  HCT 49.5* 44.7  MCV 84.9 91.4  PLT 223 186   Basic Metabolic Panel: Recent Labs  Lab 06/12/18 0815 06/13/18 0634  NA 136 140  K 3.3* 3.7  CL 94* 107  CO2 23 23  GLUCOSE 334* 166*  BUN 21 23  CREATININE 0.90 0.73  CALCIUM 9.2 7.8*  MG  --  1.6*   GFR: Estimated Creatinine Clearance: 56.6 mL/min (by C-G formula based on SCr of 0.73 mg/dL). Liver Function Tests: Recent Labs  Lab 06/12/18 0815 06/13/18 0634  AST 153* 46*  ALT 219* 103*  ALKPHOS 48 39  BILITOT 2.1* 1.4*  PROT 6.9 6.0*  ALBUMIN 4.0 3.1*   Recent Labs  Lab 06/12/18 0815 06/13/18 0634  LIPASE 1,169* 164*   No results for input(s): AMMONIA in the last 168 hours. Coagulation Profile: No results for input(s): INR, PROTIME in the last 168 hours. Cardiac Enzymes: Recent Labs  Lab 06/12/18 0815  TROPONINI <0.03   BNP (last 3 results) No results for input(s): PROBNP in the last 8760 hours. HbA1C: No results for input(s): HGBA1C in the last 72 hours. CBG: Recent Labs  Lab 06/12/18 2053 06/13/18 0104 06/13/18 0613 06/13/18 0756 06/13/18 1116  GLUCAP 150* 131* 135* 148* 156*   Lipid Profile: Recent Labs    06/12/18 0815  CHOL 183  HDL 40*  LDLCALC 116*  TRIG 133  CHOLHDL 4.6   Thyroid Function Tests: No results for input(s): TSH, T4TOTAL, FREET4, T3FREE, THYROIDAB in the last 72 hours. Anemia Panel: No results for input(s): VITAMINB12, FOLATE, FERRITIN, TIBC, IRON, RETICCTPCT in the last 72 hours. Sepsis Labs: Recent Labs  Lab  06/12/18 0843 06/12/18 1038  LATICACIDVEN 4.0* 3.6*    Recent Results (from the past 240 hour(s))  SARS Coronavirus 2 (CEPHEID - [facility]), Hosp Order     Status: None   Collection Time: 06/12/18  9:19 AM  Result Value Ref Range Status   SARS Coronavirus 2 NEGATIVE NEGATIVE Final    Comment: (NOTE) If result is NEGATIVE SARS-CoV-2 target nucleic acids are NOT DETECTED. The SARS-CoV-2 RNA is generally detectable in upper and lower  respiratory specimens during the acute phase of infection. The lowest  concentration of SARS-CoV-2 viral copies this assay can detect is 250  copies / mL. A negative result does not preclude SARS-CoV-2 infection  and should not be used as the sole basis for treatment or other  patient management decisions.  A negative result may occur with  improper specimen collection / handling, submission of specimen other  than nasopharyngeal swab, presence of viral mutation(s) within the  areas targeted by this assay, and inadequate number of viral copies  (<250 copies / mL). A negative  result must be combined with clinical  observations, patient history, and epidemiological information. If result is POSITIVE SARS-CoV-2 target nucleic acids are DETECTED. The SARS-CoV-2 RNA is generally detectable in upper and lower  respiratory specimens dur ing the acute phase of infection.  Positive  results are indicative of active infection with SARS-CoV-2.  Clinical  correlation with patient history and other diagnostic information is  necessary to determine patient infection status.  Positive results do  not rule out bacterial infection or co-infection with other viruses. If result is PRESUMPTIVE POSTIVE SARS-CoV-2 nucleic acids MAY BE PRESENT.   A presumptive positive result was obtained on the submitted specimen  and confirmed on repeat testing.  While 2019 novel coronavirus  (SARS-CoV-2) nucleic acids may be present in the submitted sample   additional confirmatory testing may be necessary for epidemiological  and / or clinical management purposes  to differentiate between  SARS-CoV-2 and other Sarbecovirus currently known to infect humans.  If clinically indicated additional testing with an alternate test  methodology [phone] is advised. The SARS-CoV-2 RNA is generally  detectable in upper and lower respiratory sp ecimens during the acute  phase of infection. The expected result is Negative. Fact Sheet for Patients:  BoilerBrush.com.cyhttps://www.fda.gov/media/136312/download Fact Sheet for Healthcare Providers: https://pope.com/https://www.fda.gov/media/136313/download This test is not yet approved or cleared by the [place] FDA and has been authorized for detection and/or diagnosis of SARS-CoV-2 by FDA under an Emergency Use Authorization (EUA).  This EUA will remain in effect (meaning this test can be used) for the duration of the COVID-19 declaration under Section 564(b)(1) of the Act, 21 U.S.C. section 360bbb-3(b)(1), unless the authorization is terminated or revoked sooner. Performed at [facility], [address] [place], [place] [address]          Radiology Studies: Dg Chest 2 View  Result Date: 06/12/2018 CLINICAL DATA:  Abdominal pain EXAM: CHEST - 2 VIEW COMPARISON:  None FINDINGS: Upper normal heart size. Mediastinal contours and pulmonary vascularity normal. Minimal bibasilar atelectasis. Lungs otherwise clear. No pleural effusion or pneumothorax. Atherosclerotic calcification aorta. Bones demineralized. IMPRESSION: Bibasilar atelectasis. Electronically Signed   By: Ulyses SouthwardMark  Boles M.D.   On: 06/12/2018 08:38   Ct Abdomen Pelvis W Contrast  Result Date: 06/12/2018 CLINICAL DATA:  Upper abdominal pain EXAM: CT ABDOMEN AND PELVIS WITH CONTRAST TECHNIQUE: Multidetector CT imaging of the abdomen and pelvis was performed using the standard protocol following bolus administration of intravenous contrast. CONTRAST:  100mL OMNIPAQUE 300  COMPARISON:  Ultrasound from earlier in the same day. FINDINGS: Lower chest: Bibasilar atelectasis right greater than left. Hepatobiliary: Decreased attenuation throughout the liver consistent with cirrhosis with mild associated ascites. The gallbladder is well distended. Pancreas: Pancreas is well visualized and demonstrates some cystic changes as well as peripancreatic inflammatory changes consistent with pancreatitis. Spleen: Spleen shows a small hypodensity likely representing hemangioma. Adrenals/Urinary Tract: The adrenal glands are within normal limits. Normal excretion of contrast is noted from the kidneys bilaterally. No renal calculi or obstructive changes are seen. The bladder is partially distended. Stomach/Bowel: Diverticulosis without evidence of diverticulitis. No obstructive or inflammatory changes of the large or small bowel are seen. The appendix is within normal limits. Vascular/Lymphatic: Heavy atherosclerotic calcifications are noted without aneurysmal dilatation. No lymphadenopathy is seen. Reproductive: Uterus and bilateral adnexa are unremarkable. Other: No abdominal wall hernia or abnormality. No abdominopelvic ascites. Musculoskeletal: Degenerative changes of lumbar spine are noted. IMPRESSION: Changes consistent with pancreatitis and peripancreatic inflammatory change. Decreased attenuation of the liver consistent with underlying  cirrhosis. Mild ascites is noted which may be related to the cirrhosis. Bibasilar atelectasis right greater than left. Chronic changes as described above. Electronically Signed   By: Inez Catalina M.D.   On: 06/12/2018 12:41   US Abdomen Limited Ruq  Result Date: 06/12/2018 CLINICAL DATA:  Elevated LFTs.  Personal history of cirrhosis. EXAM: ULTRASOUND ABDOMEN LIMITED RIGHT UPPER QUADRANT COMPARISON:  None. FINDINGS: Gallbladder: No gallstones or wall thickening visualized. No sonographic Murphy sign noted by sonographer. Common bile duct: Diameter: 6.3 mm,  upper limits of normal Liver: The liver is diffusely echogenic. Internal architecture is lost. No discrete lesions are present. Sensitivity is decreased due to hyperechoic parenchyma and body habitus. A small amount of free fluid is evident. Portal vein is patent on color Doppler imaging with normal direction of blood flow towards the liver. IMPRESSION: 1. Normal sonographic appearance of the gallbladder. 2. The liver is diffusely echogenic. Findings are consistent with the given diagnosis of cirrhosis. No discrete lesion is present. 3. Ascites. Electronically Signed   By: San Morelle M.D.   On: 06/12/2018 11:06        Scheduled Meds: . enoxaparin (LOVENOX) injection  40 mg Subcutaneous Q24H  . insulin aspart  0-15 Units Subcutaneous Q6H  . insulin aspart  0-5 Units Subcutaneous QHS  . metoprolol tartrate  25 mg Oral Daily   Continuous Infusions: . sodium chloride 175 mL/hr at 06/13/18 0905     LOS: 1 day    Time spent: 30 minutes    Oluwatosin Bracy Darleen Crocker, DO [facility] Pager [phone]  If 7PM-7AM, please contact night-coverage [email or web address] Password [email or web address] 06/13/2018, 11:46 AM

## 2018-06-13 NOTE — Progress Notes (Addendum)
Subjective: Abdominal pain improved. Only notices when moving. No N/V. Wanting to drink liquids. Shortness of breath had been noted on admission, and she denies worsening of symptoms. No cough. On nasal cannula, present since admission. Notes DOE at home with walking. Shortness of breath at times at home. No oxygen required at home. Minimal bibasilar atelectasis on CXR yesterday. Desaturations while in ED yesterday afternoon, requiring O2. Pulse ox 92%. Tachycardia noted this morning in the 1teens to 120s. Now in the 90s after receiving meal try. Anxiety precipitating shortness of breath.    Objective: Vital signs in last 24 hours: Temp:  [97.6 F (36.4 C)-99 F (37.2 C)] 97.6 F (36.4 C) (06/09 [id number]) Pulse Rate:  [93-126] 126 (06/09 0613) . 0920 rechecked while eating and was 90 Resp:  [16-31] 21 (06/09 0613) BP: (151-198)/(74-87) 167/75 (06/09 0659) SpO2:  [88 %-95 %] 92 % (06/09 0613) on 2 liters  Last BM Date: 06/11/18 General:   Alert and oriented, flat affect but cooperative Abdomen:  Bowel sounds present, soft, mild TTP upper abdomen. Obese with rounded abdomen but soft.  Extremities:  Without  edema. Neurologic:  Alert and  oriented x4  Intake/Output from previous day: 06/08 0701 - 06/09 0700 In: 2910.4 [P.O.:30; I.V.:2830.4; IV Piggyback:50] Out: -  Intake/Output this shift: No intake/output data recorded.  Lab Results: Recent Labs    06/12/18 0815 06/13/18 0634  WBC 14.3* 12.7*  HGB 17.0* 14.2  HCT 49.5* 44.7  PLT 223 186   BMET Recent Labs    06/12/18 0815 06/13/18 0634  NA 136 140  K 3.3* 3.7  CL 94* 107  CO2 23 23  GLUCOSE 334* 166*  BUN 21 23  CREATININE 0.90 0.73  CALCIUM 9.2 7.8*   LFT Recent Labs    06/12/18 0815 06/13/18 0634  PROT 6.9 6.0*  ALBUMIN 4.0 3.1*  AST 153* 46*  ALT 219* 103*  ALKPHOS 48 39  BILITOT 2.1* 1.4*  BILIDIR  --  0.4*  IBILI  --  1.0*    Studies/Results: Dg Chest 2 View  Result Date: 06/12/2018 CLINICAL  DATA:  Abdominal pain EXAM: CHEST - 2 VIEW COMPARISON:  None FINDINGS: Upper normal heart size. Mediastinal contours and pulmonary vascularity normal. Minimal bibasilar atelectasis. Lungs otherwise clear. No pleural effusion or pneumothorax. Atherosclerotic calcification aorta. Bones demineralized. IMPRESSION: Bibasilar atelectasis. Electronically Signed   By: Ulyses SouthwardMark  Boles M.D.   On: 06/12/2018 08:38   Ct Abdomen Pelvis W Contrast  Result Date: 06/12/2018 CLINICAL DATA:  Upper abdominal pain EXAM: CT ABDOMEN AND PELVIS WITH CONTRAST TECHNIQUE: Multidetector CT imaging of the abdomen and pelvis was performed using the standard protocol following bolus administration of intravenous contrast. CONTRAST:  100mL OMNIPAQUE 300 COMPARISON:  Ultrasound from earlier in the same day. FINDINGS: Lower chest: Bibasilar atelectasis right greater than left. Hepatobiliary: Decreased attenuation throughout the liver consistent with cirrhosis with mild associated ascites. The gallbladder is well distended. Pancreas: Pancreas is well visualized and demonstrates some cystic changes as well as peripancreatic inflammatory changes consistent with pancreatitis. Spleen: Spleen shows a small hypodensity likely representing hemangioma. Adrenals/Urinary Tract: The adrenal glands are within normal limits. Normal excretion of contrast is noted from the kidneys bilaterally. No renal calculi or obstructive changes are seen. The bladder is partially distended. Stomach/Bowel: Diverticulosis without evidence of diverticulitis. No obstructive or inflammatory changes of the large or small bowel are seen. The appendix is within normal limits. Vascular/Lymphatic: Heavy atherosclerotic calcifications are noted without aneurysmal dilatation. No lymphadenopathy  is seen. Reproductive: Uterus and bilateral adnexa are unremarkable. Other: No abdominal wall hernia or abnormality. No abdominopelvic ascites. Musculoskeletal: Degenerative changes of lumbar spine  are noted. IMPRESSION: Changes consistent with pancreatitis and peripancreatic inflammatory change. Decreased attenuation of the liver consistent with underlying cirrhosis. Mild ascites is noted which may be related to the cirrhosis. Bibasilar atelectasis right greater than left. Chronic changes as described above. Electronically Signed   By: Inez Catalina M.D.   On: 06/12/2018 12:41   US Abdomen Limited Ruq  Result Date: 06/12/2018 CLINICAL DATA:  Elevated LFTs.  Personal history of cirrhosis. EXAM: ULTRASOUND ABDOMEN LIMITED RIGHT UPPER QUADRANT COMPARISON:  None. FINDINGS: Gallbladder: No gallstones or wall thickening visualized. No sonographic Murphy sign noted by sonographer. Common bile duct: Diameter: 6.3 mm, upper limits of normal Liver: The liver is diffusely echogenic. Internal architecture is lost. No discrete lesions are present. Sensitivity is decreased due to hyperechoic parenchyma and body habitus. A small amount of free fluid is evident. Portal vein is patent on color Doppler imaging with normal direction of blood flow towards the liver. IMPRESSION: 1. Normal sonographic appearance of the gallbladder. 2. The liver is diffusely echogenic. Findings are consistent with the given diagnosis of cirrhosis. No discrete lesion is present. 3. Ascites. Electronically Signed   By: San Morelle M.D.   On: 06/12/2018 11:06    Assessment: 78 year old female admitted with acute pancreatitis, associated elevated transaminases and bilirubin, no evidence for gallstones or acute cholecystitis. No CBD dilatation. Lipid panel unimpressive. Query biliary etiology. Dehydrated on admission, receiving 2 liters of NS, 1 liter LR, and started on NS IV at 250 ml/hour yesterday evening, backed down to 175 ml/hour this morning.   Clinically, her abdominal pain is resolved, and she is desiring to take in oral liquids. HFP reviewed with transaminases improved by over half and bilirubin down from 2.1 to 1.4.    Events from ED noted yesterday: patient desaturated into the 80s and required 2 liters nasal cannula oxygen. She has remained on this since admission. She is intermittently tachypneic per nursing staff, and this was improved when I saw her after receiving clear liquid tray. Tachycardic on morning vitals documented but heart rate in the 90s when I reassessed. Lungs clear on exam. No anasarca or lower extremity edema. Known mild atelectasis on CXR at admission. Upon further discussion, she admits that she has had shortness of breath occasionally at rest and dyspnea with exertion at home prior to admission. IV fluid has been decreased to 175, and I discussed with nursing staff again to monitor closely. Unfortunately, I/Os not accurate due to unmeasured urine occurrences. Discussed with nursing that she will need telemetry so we can closely monitor her vitals and O2 status. Incentive spirometer also ordered. Discussed with both nursing and hospitalist.   Concern for cirrhosis on imaging: further follow-up as outpatient. Likely related to fatty liver.   Plan: Continue to follow HFP Will likely need surgical consult for elective cholecystectomy.  Start clear liquids IVFs adjusted to 175 ml/hour Strict I/O: measure urine output and document Place on telemetry Incentive spirometer ordered Will reassess need for dedicated imaging of pancreas as an outpatient following clinical course and likely elective cholecystectomy in the future, especially if LFTs do not normalize Outpatient evaluation for cirrhosis Will continue to follow with you   Annitta Needs, PhD, ANP-BC [facility]   LOS: 1 day    06/13/2018, 8:06 AM

## 2018-06-13 NOTE — Progress Notes (Signed)
Patient states that she "can't breath". MD made aware. Patient is extremely short of breath and struggles to breathe when at rest. Called respiratory for PRN breathing treatment.

## 2018-06-13 NOTE — Progress Notes (Signed)
Patient is resting in bed. Patient states that she is not in any pain unless she moves around.  Patient remains on oxygen 2L via nasal cannula due to desaturation on ambulation, patient states that she is out of breath even when resting but it is a problem that she has had for "a while". Patient offers no complaints at this time.

## 2018-06-13 NOTE — Progress Notes (Signed)
*  PRELIMINARY RESULTS* Echocardiogram 2D Echocardiogram has been performed.  Leavy Cella 06/13/2018, 3:02 PM

## 2018-06-14 ENCOUNTER — Inpatient Hospital Stay (HOSPITAL_COMMUNITY): Payer: Medicare HMO

## 2018-06-14 DIAGNOSIS — K219 Gastro-esophageal reflux disease without esophagitis: Secondary | ICD-10-CM

## 2018-06-14 DIAGNOSIS — I10 Essential (primary) hypertension: Secondary | ICD-10-CM

## 2018-06-14 DIAGNOSIS — K859 Acute pancreatitis without necrosis or infection, unspecified: Principal | ICD-10-CM

## 2018-06-14 LAB — COMPREHENSIVE METABOLIC PANEL
ALT: 62 U/L — ABNORMAL HIGH (ref 0–44)
AST: 24 U/L (ref 15–41)
Albumin: 2.8 g/dL — ABNORMAL LOW (ref 3.5–5.0)
Alkaline Phosphatase: 41 U/L (ref 38–126)
Anion gap: 11 (ref 5–15)
BUN: 16 mg/dL (ref 8–23)
CO2: 27 mmol/L (ref 22–32)
Calcium: 7.9 mg/dL — ABNORMAL LOW (ref 8.9–10.3)
Chloride: 101 mmol/L (ref 98–111)
Creatinine, Ser: 0.61 mg/dL (ref 0.44–1.00)
GFR calc Af Amer: >60 mL/min (ref >60)
GFR calc non Af Amer: >60 mL/min (ref >60)
Glucose, Bld: 166 mg/dL — ABNORMAL HIGH (ref 70–99)
Potassium: 3.6 mmol/L (ref 3.5–5.1)
Sodium: 139 mmol/L (ref 135–145)
Total Bilirubin: 1 mg/dL (ref 0.3–1.2)
Total Protein: 6 g/dL — ABNORMAL LOW (ref 6.5–8.1)

## 2018-06-14 LAB — BLOOD GAS, ARTERIAL
Acid-Base Excess: 3.8 mmol/L — ABNORMAL HIGH (ref 0.0–2.0)
Bicarbonate: 27.8 mmol/L (ref 20.0–28.0)
FIO2: 40
O2 Saturation: 97.5 %
Patient temperature: 37
pCO2 arterial: 40.1 mmHg (ref 32.0–48.0)
pH, Arterial: 7.452 — ABNORMAL HIGH (ref 7.350–7.450)
pO2, Arterial: 88.7 mmHg (ref 83.0–108.0)

## 2018-06-14 LAB — GLUCOSE, CAPILLARY
Glucose-Capillary: 118 mg/dL — ABNORMAL HIGH (ref 70–99)
Glucose-Capillary: 141 mg/dL — ABNORMAL HIGH (ref 70–99)
Glucose-Capillary: 142 mg/dL — ABNORMAL HIGH (ref 70–99)
Glucose-Capillary: 145 mg/dL — ABNORMAL HIGH (ref 70–99)
Glucose-Capillary: 148 mg/dL — ABNORMAL HIGH (ref 70–99)
Glucose-Capillary: 167 mg/dL — ABNORMAL HIGH (ref 70–99)

## 2018-06-14 LAB — LIPASE, BLOOD: Lipase: 32 U/L (ref 11–51)

## 2018-06-14 LAB — LACTIC ACID, PLASMA: Lactic Acid, Venous: 1.2 mmol/L (ref 0.5–1.9)

## 2018-06-14 LAB — CBC
HCT: 40.4 % (ref 36.0–46.0)
Hemoglobin: 13.1 g/dL (ref 12.0–15.0)
MCH: 29.5 pg (ref 26.0–34.0)
MCHC: 32.4 g/dL (ref 30.0–36.0)
MCV: 91 fL (ref 80.0–100.0)
Platelets: 154 10*3/uL (ref 150–400)
RBC: 4.44 MIL/uL (ref 3.87–5.11)
RDW: 14.7 % (ref 11.5–15.5)
WBC: 10.5 10*3/uL (ref 4.0–10.5)
nRBC: 0 % (ref 0.0–0.2)

## 2018-06-14 LAB — MAGNESIUM: Magnesium: 2.1 mg/dL (ref 1.7–2.4)

## 2018-06-14 MED ORDER — FUROSEMIDE 10 MG/ML IJ SOLN
40.0000 mg | Freq: Once | INTRAMUSCULAR | Status: AC
  Administered 2018-06-14: 08:00:00 40 mg via INTRAVENOUS
  Filled 2018-06-14: qty 4

## 2018-06-14 MED ORDER — POTASSIUM CHLORIDE CRYS ER 20 MEQ PO TBCR
40.0000 meq | EXTENDED_RELEASE_TABLET | Freq: Once | ORAL | Status: AC
  Administered 2018-06-14: 40 meq via ORAL
  Filled 2018-06-14: qty 2

## 2018-06-14 MED ORDER — OXYCODONE HCL 5 MG PO TABS
5.0000 mg | ORAL_TABLET | ORAL | Status: DC | PRN
  Administered 2018-06-14 – 2018-06-18 (×10): 5 mg via ORAL
  Filled 2018-06-14 (×10): qty 1

## 2018-06-14 MED ORDER — HYDRALAZINE HCL 20 MG/ML IJ SOLN: 10.0000 mg | INTRAMUSCULAR | Status: DC | PRN

## 2018-06-14 MED ORDER — FUROSEMIDE 10 MG/ML IJ SOLN
40.0000 mg | Freq: Once | INTRAMUSCULAR | Status: AC
  Administered 2018-06-14: 40 mg via INTRAVENOUS
  Filled 2018-06-14: qty 4

## 2018-06-14 NOTE — Care Management Important Message (Signed)
Important Message  Patient Details  Name: Heather Oconnor MRN: [id number] Date of Birth: [birth year]   Medicare Important Message Given:  Yes    Tommy Medal 06/14/2018, 12:59 PM

## 2018-06-14 NOTE — Progress Notes (Signed)
Subjective: Patient states she "isn't too good." Feels like she can't breathe. Currently on 3L of O2 statting 98%. Feeling very anxious. Asking for something to drink, but attending has asked to hold off for now. Denies nausea and vomiting. Denies current abdominal pain. States she hasn't had a BM since admission. Admits to urinating frequently since receiving Lasix.      Objective: Vital signs in last 24 hours: Temp:  [98 F (36.7 C)-99.3 F (37.4 C)] 98.1 F (36.7 C) (06/10 0814) Pulse Rate:  [95-104] 103 (06/10 0814) Resp:  [18-28] 20 (06/10 0814) BP: (158-191)/(70-85) 176/85 (06/10 0723) SpO2:  [90 %-99 %] 99 % (06/10 0814) Last BM Date: 06/11/18 General:   Alert but ill appearing and effort with breathing.  Heart:  S1, S2 present, no murmurs noted, tachycardic on exam.  Lungs: Decreased lung sounds in the bases. Breathing with increased effort. Nasal canula in place, on 3L of O2.  Abdomen:  Bowel sounds present in all 4 quadrants. Soft non distended abdomen with tenderness to palpation difusely, but worse in the upper abdomen. No HSM or hernias noted. No rebound or guarding. No masses appreciated  Extremities:  Without clubbing or edema. Psych:  Alert and cooperative, but anxious.   Intake/Output from previous day: 06/09 0701 - 06/10 0700 In: 4162.5 [P.O.:600; I.V.:3562.5] Out: 2000 [Urine:2000] Intake/Output this shift: No intake/output data recorded.   Lab Results: Recent Labs    06/12/18 0815 06/13/18 0634 06/14/18 0544  WBC 14.3* 12.7* 10.5  HGB 17.0* 14.2 13.1  HCT 49.5* 44.7 40.4  PLT 223 186 154   BMET Recent Labs    06/12/18 0815 06/13/18 0634 06/14/18 0544  NA 136 140 139  K 3.3* 3.7 3.6  CL 94* 107 101  CO2 23 23 27   GLUCOSE 334* 166* 166*  BUN 21 23 16   CREATININE 0.90 0.73 0.61  CALCIUM 9.2 7.8* 7.9*   LFT Recent Labs    06/12/18 0815 06/13/18 0634 06/14/18 0544  PROT 6.9 6.0* 6.0*  ALBUMIN 4.0 3.1* 2.8*  AST 153* 46* 24  ALT 219*  103* 62*  ALKPHOS 48 39 41  BILITOT 2.1* 1.4* 1.0  BILIDIR  --  0.4*  --   IBILI  --  1.0*  --      Studies/Results: Ct Abdomen Pelvis W Contrast  Result Date: 06/12/2018 CLINICAL DATA:  Upper abdominal pain EXAM: CT ABDOMEN AND PELVIS WITH CONTRAST TECHNIQUE: Multidetector CT imaging of the abdomen and pelvis was performed using the standard protocol following bolus administration of intravenous contrast. CONTRAST:  141mL OMNIPAQUE 300 COMPARISON:  Ultrasound from earlier in the same day. FINDINGS: Lower chest: Bibasilar atelectasis right greater than left. Hepatobiliary: Decreased attenuation throughout the liver consistent with cirrhosis with mild associated ascites. The gallbladder is well distended. Pancreas: Pancreas is well visualized and demonstrates some cystic changes as well as peripancreatic inflammatory changes consistent with pancreatitis. Spleen: Spleen shows a small hypodensity likely representing hemangioma. Adrenals/Urinary Tract: The adrenal glands are within normal limits. Normal excretion of contrast is noted from the kidneys bilaterally. No renal calculi or obstructive changes are seen. The bladder is partially distended. Stomach/Bowel: Diverticulosis without evidence of diverticulitis. No obstructive or inflammatory changes of the large or small bowel are seen. The appendix is within normal limits. Vascular/Lymphatic: Heavy atherosclerotic calcifications are noted without aneurysmal dilatation. No lymphadenopathy is seen. Reproductive: Uterus and bilateral adnexa are unremarkable. Other: No abdominal wall hernia or abnormality. No abdominopelvic ascites. Musculoskeletal: Degenerative changes of lumbar spine  are noted. IMPRESSION: Changes consistent with pancreatitis and peripancreatic inflammatory change. Decreased attenuation of the liver consistent with underlying cirrhosis. Mild ascites is noted which may be related to the cirrhosis. Bibasilar atelectasis right greater than left.  Chronic changes as described above. Electronically Signed   By: Alcide CleverMark  Lukens M.D.   On: 06/12/2018 12:41   Dg Chest Port 1 View  Result Date: 06/13/2018 CLINICAL DATA:  78 year old female with shortness of breath. EXAM: PORTABLE CHEST 1 VIEW COMPARISON:  06/12/2018 chest radiographs. CT Abdomen and Pelvis yesterday. FINDINGS: Portable AP upright view at 1950 hours. Dense new left lung base opacity obscuring the hemidiaphragm. Patchy right lung base opacity appearing similar to the CT yesterday. Increased pulmonary vascularity from the radiographs yesterday. Stable mild cardiomegaly. Other mediastinal contours are within normal limits. Visualized tracheal air column is within normal limits. No pneumothorax. Paucity of bowel gas in the upper abdomen. IMPRESSION: 1. New pulmonary interstitial edema and small left pleural effusion suspected. 2. Mild chronic cardiomegaly. 3. Patchy right lung base opacity appears similar to the atelectasis on the CT yesterday. Electronically Signed   By: Odessa FlemingH  Hall M.D.   On: 06/13/2018 20:07   [place] Abdomen Limited Ruq  Result Date: 06/12/2018 CLINICAL DATA:  Elevated LFTs.  Personal history of cirrhosis. EXAM: ULTRASOUND ABDOMEN LIMITED RIGHT UPPER QUADRANT COMPARISON:  None. FINDINGS: Gallbladder: No gallstones or wall thickening visualized. No sonographic Murphy sign noted by sonographer. Common bile duct: Diameter: 6.3 mm, upper limits of normal Liver: The liver is diffusely echogenic. Internal architecture is lost. No discrete lesions are present. Sensitivity is decreased due to hyperechoic parenchyma and body habitus. A small amount of free fluid is evident. Portal vein is patent on color Doppler imaging with normal direction of blood flow towards the liver. IMPRESSION: 1. Normal sonographic appearance of the gallbladder. 2. The liver is diffusely echogenic. Findings are consistent with the given diagnosis of cirrhosis. No discrete lesion is present. 3. Ascites. Electronically  Signed   By: Marin Robertshristopher  Mattern M.D.   On: 06/12/2018 11:06    Assessment: 78 year old female admitted with acute pancreatitis, associated elevated transaminases and bilirubin, no evidence for gallstones or acute cholecystitis. No CBD dilatation. Lipid panel unimpressive. Etiology ikely transient biliary obstruction related to microlithiasis. Dehydrated on admission, receiving 2 liters of NS, 1 liter LR, and started on NS IV at 250 ml/hour on 6/8, backed down to 175 ml/hour on 6/9. She has been on supplemental oxygen since admission which is new to her. CXR on 6/8 with minimal bibasilar atelectasis. She experienced subjective worsening shortness of breath over night. CXR was repeated with new pulmonary interstitial edema and small left pleural effusion suspected. Lasix 60mg  x1 last night and 40mg  x1 this morning. Also received dose of lorazepan last night for anxiety. Subjectively patient is still feeling short of breath, but she is stating 98% on 3L nasal canula. Fluids were discontinued this morning. Repeat CXR also ordered.    Acute pancreatitis is much improved. Labs today with Lipase 32, AST 24, ALT 62, WBC 10.5. Hemglobin 13.1 and hematocrit 40.4. Tachycardia is still present but improving. Likely associated with her respiratory status and anxiety. Her abdominal pain is subjectively improved although still tender to palpation. Nausea and vomiting has resolved. She desires to continue taking in fluids, but this has been put on hold at the request of attending until her respiratory status is further evaluated. No need for repeat imaging of her pancreatitis as this seems to be following the clinical picture of  transient obstruction with LFTs continuing to decrease. At this point, it seems that it is her respiratory status that is of most concern. We will continue to follow.   Concern for cirrhosis on imaging: further follow-up as outpatient. Likely related to fatty liver.   Plan: Continue to follow  HFP Will place non-urgent surgical consult for elective cholecystectomy pending improvement in respiratory status.  Placed on continuous pulse ox.  Likely will not need outpatient dedicated imaging of pancreas at this time as this appears to be biliary related and her LFTs are normalizing; however, will continue to follow clinically.  Appropriate for clear liquids from a GI standpoint. Comfortable resuming at the per attending pending her respiratory status.   Outpatient evaluation for cirrhosis Will continue to follow with you.     LOS: 2 days    06/14/2018, 8:40 AM   Ermalinda MemosKristen Sarabi Sockwell, [facility]

## 2018-06-14 NOTE — Progress Notes (Addendum)
PROGRESS NOTE    Heather Oconnor  ZOX:[id number] DOB: 01/31/1940 DOA: 06/12/2018 PCP: Joette CatchingNyland, Leonard, MD   Brief Narrative:  Per HPI: Heather Oconnor is a 78 y.o. female with medical history significant for GERD, IBS, hemorrhoids, hypertension, type 2 diabetes, prior TIA, prior tobacco abuse, and obesity who presented with epigastric as well as some generalized abdominal pain that began earlier yesterday.  This was associated with intermittent episodes of nausea and vomiting and some questionable diarrhea.  She denies any hematemesis or dark stools or bloody stools.  She denies any fevers or chills and has no chest pain, shortness of breath, or cough.  She denies any radiation of the pain otherwise and has had nothing to eat or drink since yesterday morning.  She denies any new medications and denies alcohol use.   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, now patient has volume overload in the setting of IV fluids for pancreatitis, EF 55 to 60%, IV diuresis as ordered, supplemental oxygen, chest x-ray from 06/14/2018 continues to show pulmonary venous congestion/pulmonary edema with bilateral effusions  Acute hypoxic respiratory failure--- PTA patient was not on oxygen, hypoxia is due to volume overload in the setting of aggressive IV fluids for acute pancreatitis, continue supplemental oxygen and IV diuresis  Acute pancreatitis-unknown etiology -Patient denies any alcohol use or prior episodes; appreciate GI consultation for assistance in management -Lipid panel without any significant triglyceride elevation -Trend CMP as well as lipase which are improving -Stop IV fluids due to volume overload, advance diet as per GI  service -Pain management with IV Dilaudid as needed -Zofran for nausea and vomiting which has improved  HTN-BP is not at goal, continue amlodipine 10 mg daily, continue Toprol-XL 100 mg daily IV hydralazine as needed  Transaminitis-downtrending -Follow LFTs-No signs of obstruction currently noted and this is likely related to pancreatitis  IBS with diarrhea -Appreciate GI evaluation -Does not appear to be infectious etiology  Type 2 diabetes with hyperglycemia-improved -hold home medications until oral intake is more reliable -SSI qAC and qHS before meals and nightly no  GERD -PO Protonix  Prior TIA -Hold statin for now -c/n home aspirin and Plavix   DVT prophylaxis: Lovenox Code Status: Full Family Communication: Updated husband on phone on 06/14/18--[phone]--phone message states that his cell phone voicemail has not been set up yet Disposition Plan: Currently requiring IV diuresis  Consultants:   GI  Procedures:   None  Antimicrobials:   None   Subjective: Continues to require oxygen, continues to have conversational dyspnea,... No chest pains, generalized weakness and dyspnea on exertion persist, RN Daniel at bedside  Objective: Vitals:   06/14/18 0814 06/14/18 0900 06/14/18 1200 06/14/18 1321  BP:  (!) 170/76 (!) 168/74 (!) 161/71  Pulse: (!) 103 (!) 102 85 88  Resp: 20  20 18   Temp: 98.1 F (36.7 C)  99.4 F (37.4 C) 98.9 F (37.2 C)  TempSrc: Oral  Oral Oral  SpO2: 99%  93% 95%  Weight:      Height:        Intake/Output Summary (Last 24 hours) at 06/14/2018 1844 Last data filed at 06/14/2018 1837 Gross per 24 hour  Intake 4882.5 ml  Output 2100 ml  Net 2782.5 ml   Filed Weights   06/12/18 0735  Weight: 84 kg    Examination:   Physical Exam   Gen:- Awake Alert, some conversational dyspnea HEENT:- [place].AT, No sclera icterus Nose- [place] 2 L/min Neck-Supple Neck,   Lungs-diminished in bases with faint bibasilar rales  CV- S1,  S2 normal Abd-  +ve B.Sounds, Abd Soft, No tenderness,    Extremity/Skin:- 1+ edema,    Psych-affect is appropriate, oriented x3 Neuro-generalized weakness, but no new focal deficits, no tremors     Data Reviewed: I have personally reviewed following labs and imaging studies--- chest x-ray consistent with CHF/pulmonary venous congestion  CBC: Recent Labs  Lab 06/12/18 0815 06/13/18 0634 06/14/18 0544  WBC 14.3* 12.7* 10.5  NEUTROABS 13.1*  --   --   HGB 17.0* 14.2 13.1  HCT 49.5* 44.7 40.4  MCV 84.9 91.4 91.0  PLT 223 186 154   Basic Metabolic Panel: Recent Labs  Lab 06/12/18 0815 06/13/18 0634 06/14/18 0544  NA 136 140 139  K 3.3* 3.7 3.6  CL 94* 107 101  CO2 23 23 27   GLUCOSE 334* 166* 166*  BUN 21 23 16   CREATININE 0.90 0.73 0.61  CALCIUM 9.2 7.8* 7.9*  MG  --  1.6* 2.1   GFR: Estimated Creatinine Clearance: 56.6 mL/min (by C-G formula based on SCr of 0.61 mg/dL). Liver Function Tests: Recent Labs  Lab 06/12/18 0815 06/13/18 0634 06/14/18 0544  AST 153* 46* 24  ALT 219* 103* 62*  ALKPHOS 48 39 41  BILITOT 2.1* 1.4* 1.0  PROT 6.9 6.0* 6.0*  ALBUMIN 4.0 3.1* 2.8*   Recent Labs  Lab 06/12/18 0815 06/13/18 0634 06/14/18 0544  LIPASE 1,169* 164* 32   No results for input(s): AMMONIA in the last 168 hours. Coagulation Profile: No results for input(s): INR, PROTIME in the last 168 hours. Cardiac Enzymes: Recent Labs  Lab 06/12/18 0815  TROPONINI <0.03   BNP (last 3 results) No results for input(s): PROBNP in the last 8760 hours. HbA1C: No results for input(s): HGBA1C in the last 72 hours. CBG: Recent Labs  Lab 06/14/18 0545 06/14/18 0750 06/14/18 1103 06/14/18 1435 06/14/18 1604  GLUCAP 145* 167* 148* 141* 142*   Lipid Profile: Recent Labs    06/12/18 0815  CHOL 183  HDL 40*  LDLCALC 116*  TRIG 133  CHOLHDL 4.6   Thyroid Function Tests: No results for input(s): TSH, T4TOTAL, FREET4, T3FREE, THYROIDAB in the last 72 hours.  Anemia Panel: No results for input(s): VITAMINB12, FOLATE, FERRITIN, TIBC, IRON, RETICCTPCT in the last 72 hours. Sepsis Labs: Recent Labs  Lab 06/12/18 0843 06/12/18 1038 06/14/18 0544  LATICACIDVEN 4.0* 3.6* 1.2    Recent Results (from the past 240 hour(s))  SARS Coronavirus 2 (CEPHEID - Performed in [facility]), Hosp Order     Status: None   Collection Time: 06/12/18  9:19 AM  Result Value Ref Range Status   SARS Coronavirus 2 NEGATIVE NEGATIVE Final    Comment: (NOTE) If result is NEGATIVE SARS-CoV-2 target nucleic acids are NOT DETECTED. The SARS-CoV-2 RNA is generally detectable in upper and lower  respiratory specimens during the acute phase of infection. The lowest  concentration of SARS-CoV-2 viral copies this assay can detect is 250  copies / mL. A negative result does not preclude SARS-CoV-2 infection  and should not be used as the sole basis for treatment or other  patient management decisions.  A negative result may occur with  improper  specimen collection / handling, submission of specimen other  than nasopharyngeal swab, presence of viral mutation(s) within the  areas targeted by this assay, and inadequate number of viral copies  (<250 copies / mL). A negative result must be combined with clinical  observations, patient history, and epidemiological information. If result is POSITIVE SARS-CoV-2 target nucleic acids are DETECTED. The SARS-CoV-2 RNA is generally detectable in upper and lower  respiratory specimens dur ing the acute phase of infection.  Positive  results are indicative of active infection with SARS-CoV-2.  Clinical  correlation with patient history and other diagnostic information is  necessary to determine patient infection status.  Positive results do  not rule out bacterial infection or co-infection with other viruses. If result is PRESUMPTIVE POSTIVE SARS-CoV-2 nucleic acids MAY BE PRESENT.   A presumptive positive result was  obtained on the submitted specimen  and confirmed on repeat testing.  While 2019 novel coronavirus  (SARS-CoV-2) nucleic acids may be present in the submitted sample  additional confirmatory testing may be necessary for epidemiological  and / or clinical management purposes  to differentiate between  SARS-CoV-2 and other Sarbecovirus currently known to infect humans.  If clinically indicated additional testing with an alternate test  methodology [phone]) is advised. The SARS-CoV-2 RNA is generally  detectable in upper and lower respiratory sp ecimens during the acute  phase of infection. The expected result is Negative. Fact Sheet for Patients:  StrictlyIdeas.no Fact Sheet for Healthcare Providers: BankingDealers.co.za This test is not yet approved or cleared by the [place] FDA and has been authorized for detection and/or diagnosis of SARS-CoV-2 by FDA under an Emergency Use Authorization (EUA).  This EUA will remain in effect (meaning this test can be used) for the duration of the COVID-19 declaration under Section 564(b)(1) of the Act, 21 U.S.C. section 360bbb-3(b)(1), unless the authorization is terminated or revoked sooner. Performed at [facility], [address], [place], [place] [address]   Urine culture     Status: None   Collection Time: 06/12/18 11:41 AM  Result Value Ref Range Status   Specimen Description   Final    URINE, CATHETERIZED Performed at [facility], [address] [place], [place] [address]    Special Requests   Final    NONE Performed at [facility], [address], [place], [place] [address]    Culture   Final    NO GROWTH Performed at [facility], [place] [address] [place], [place] [address]    Report Status 06/13/2018 FINAL  Final      Radiology Studies: Dg Chest Port 1 View  Result Date: 06/14/2018 CLINICAL DATA:  Short of breath for several days EXAM: PORTABLE CHEST 1 VIEW  COMPARISON:  06/13/2018 FINDINGS: Small bilateral pleural effusions. Mild bilateral interstitial thickening. No pneumothorax. Stable cardiomegaly. No acute osseous abnormality. IMPRESSION: 1. Mild interstitial edema with small bilateral pleural effusions. Electronically Signed   By: Kathreen Devoid   On: 06/14/2018 12:10   Dg Chest Port 1 View  Result Date: 06/13/2018 CLINICAL DATA:  78 year old female with shortness of breath. EXAM: PORTABLE CHEST 1 VIEW COMPARISON:  06/12/2018 chest radiographs. CT Abdomen and Pelvis yesterday. FINDINGS: Portable AP upright view at 1950 hours. Dense new left lung base opacity obscuring the hemidiaphragm. Patchy right lung base opacity appearing similar to the CT yesterday. Increased pulmonary vascularity from the radiographs yesterday. Stable mild cardiomegaly. Other mediastinal contours are within normal limits. Visualized tracheal air column is within normal limits. No  pneumothorax. Paucity of bowel gas in the upper abdomen. IMPRESSION: 1. New pulmonary interstitial edema and small left pleural effusion suspected. 2. Mild chronic cardiomegaly. 3. Patchy right lung base opacity appears similar to the atelectasis on the CT yesterday. Electronically Signed   By: Odessa FlemingH  Hall M.D.   On: 06/13/2018 20:07     Scheduled Meds: . amLODipine  10 mg Oral Daily  . aspirin  81 mg Oral Daily  . clopidogrel  75 mg Oral Daily  . enoxaparin (LOVENOX) injection  40 mg Subcutaneous Q24H  . insulin aspart  0-15 Units Subcutaneous Q6H  . insulin aspart  0-5 Units Subcutaneous QHS  . metoprolol succinate  100 mg Oral Daily  . pantoprazole  40 mg Oral Daily   Continuous Infusions:    LOS: 2 days      Blade Scheff, [facility] If 7PM-7AM, please contact night-coverage [email or web address] Password [email or web address] 06/14/2018, 6:44 PM

## 2018-06-15 DIAGNOSIS — R0689 Other abnormalities of breathing: Secondary | ICD-10-CM | POA: Diagnosis present

## 2018-06-15 DIAGNOSIS — R0600 Dyspnea, unspecified: Secondary | ICD-10-CM | POA: Diagnosis present

## 2018-06-15 DIAGNOSIS — K580 Irritable bowel syndrome with diarrhea: Secondary | ICD-10-CM

## 2018-06-15 LAB — GLUCOSE, CAPILLARY
Glucose-Capillary: 138 mg/dL — ABNORMAL HIGH (ref 70–99)
Glucose-Capillary: 142 mg/dL — ABNORMAL HIGH (ref 70–99)
Glucose-Capillary: 151 mg/dL — ABNORMAL HIGH (ref 70–99)
Glucose-Capillary: 154 mg/dL — ABNORMAL HIGH (ref 70–99)
Glucose-Capillary: 158 mg/dL — ABNORMAL HIGH (ref 70–99)
Glucose-Capillary: 166 mg/dL — ABNORMAL HIGH (ref 70–99)
Glucose-Capillary: 180 mg/dL — ABNORMAL HIGH (ref 70–99)

## 2018-06-15 LAB — BASIC METABOLIC PANEL
Anion gap: 11 (ref 5–15)
BUN: 18 mg/dL (ref 8–23)
CO2: 29 mmol/L (ref 22–32)
Calcium: 8.4 mg/dL — ABNORMAL LOW (ref 8.9–10.3)
Chloride: 99 mmol/L (ref 98–111)
Creatinine, Ser: 0.55 mg/dL (ref 0.44–1.00)
GFR calc Af Amer: >60 mL/min (ref >60)
GFR calc non Af Amer: >60 mL/min (ref >60)
Glucose, Bld: 170 mg/dL — ABNORMAL HIGH (ref 70–99)
Potassium: 3.9 mmol/L (ref 3.5–5.1)
Sodium: 139 mmol/L (ref 135–145)

## 2018-06-15 MED ORDER — LABETALOL HCL 5 MG/ML IV SOLN: 10.0000 mg | INTRAVENOUS | Status: DC | PRN

## 2018-06-15 MED ORDER — FUROSEMIDE 10 MG/ML IJ SOLN
40.0000 mg | Freq: Once | INTRAMUSCULAR | Status: AC
  Administered 2018-06-15: 40 mg via INTRAVENOUS
  Filled 2018-06-15: qty 4

## 2018-06-15 MED ORDER — FUROSEMIDE 10 MG/ML IJ SOLN
20.0000 mg | Freq: Once | INTRAMUSCULAR | Status: AC
  Administered 2018-06-15: 20 mg via INTRAVENOUS
  Filled 2018-06-15: qty 2

## 2018-06-15 MED ORDER — SIMETHICONE 40 MG/0.6ML PO SUSP
40.0000 mg | Freq: Once | ORAL | Status: AC
  Administered 2018-06-15: 40 mg via ORAL
  Filled 2018-06-15: qty 30

## 2018-06-15 MED ORDER — ISOSORBIDE MONONITRATE ER 60 MG PO TB24
30.0000 mg | ORAL_TABLET | Freq: Every day | ORAL | Status: DC
  Administered 2018-06-15: 30 mg via ORAL
  Filled 2018-06-15 (×2): qty 1

## 2018-06-15 MED ORDER — HYDRALAZINE HCL 25 MG PO TABS
50.0000 mg | ORAL_TABLET | Freq: Three times a day (TID) | ORAL | Status: DC
  Administered 2018-06-15 (×3): 50 mg via ORAL
  Filled 2018-06-15 (×4): qty 2

## 2018-06-15 NOTE — Progress Notes (Addendum)
Subjective: She tried clear liquid diet today and began having lower abdominal pain. Denies N/V. No bowel movement since admission. Did have some soda with her liquids. Denies overt GERD/dyspepsia symptoms.   Objective: Vital signs in last 24 hours: Temp:  [98.1 F (36.7 C)-99.4 F (37.4 C)] 98.1 F (36.7 C) (06/11 0555) Pulse Rate:  [85-103] 91 (06/11 0555) Resp:  [18-20] 18 (06/11 0555) BP: (161-178)/(71-83) 178/71 (06/11 0555) SpO2:  [93 %-99 %] 95 % (06/11 0555) Last BM Date: 06/11/18 General:   Alert and oriented, pleasant Head:  Normocephalic and atraumatic. Eyes:  No icterus, sclera clear. Conjuctiva pink.  Heart:  S1, S2 present, no murmurs noted.  Lungs: Clear to auscultation bilaterally, without wheezing, rales, or rhonchi.  Abdomen:  Bowel sounds present, soft, non-distended. Mild lower abdominal TPP. No HSM or hernias noted. No rebound or guarding. No masses appreciated  Msk:  Symmetrical without gross deformities. Extremities:  Without clubbing or edema. Neurologic:  Alert and  oriented x4;  grossly normal neurologically. Psych:  Alert and cooperative. Normal mood and affect.  Intake/Output from previous day: 06/10 0701 - 06/11 0700 In: 720 [P.O.:720] Out: 450 [Urine:450] Intake/Output this shift: No intake/output data recorded.  Lab Results: Recent Labs    06/12/18 0815 06/13/18 0634 06/14/18 0544  WBC 14.3* 12.7* 10.5  HGB 17.0* 14.2 13.1  HCT 49.5* 44.7 40.4  PLT 223 186 154   BMET Recent Labs    06/13/18 0634 06/14/18 0544 06/15/18 0501  NA 140 139 139  K 3.7 3.6 3.9  CL 107 101 99  CO2 23 27 29   GLUCOSE 166* 166* 170*  BUN 23 16 18   CREATININE 0.73 0.61 0.55  CALCIUM 7.8* 7.9* 8.4*   LFT Recent Labs    06/12/18 0815 06/13/18 0634 06/14/18 0544  PROT 6.9 6.0* 6.0*  ALBUMIN 4.0 3.1* 2.8*  AST 153* 46* 24  ALT 219* 103* 62*  ALKPHOS 48 39 41  BILITOT 2.1* 1.4* 1.0  BILIDIR  --  0.4*  --   IBILI  --  1.0*  --    PT/INR No  results for input(s): LABPROT, INR in the last 72 hours. Hepatitis Panel No results for input(s): HEPBSAG, HCVAB, HEPAIGM, HEPBIGM in the last 72 hours.   Studies/Results: Dg Chest Port 1 View  Result Date: 06/14/2018 CLINICAL DATA:  Short of breath for several days EXAM: PORTABLE CHEST 1 VIEW COMPARISON:  06/13/2018 FINDINGS: Small bilateral pleural effusions. Mild bilateral interstitial thickening. No pneumothorax. Stable cardiomegaly. No acute osseous abnormality. IMPRESSION: 1. Mild interstitial edema with small bilateral pleural effusions. Electronically Signed   By: Kathreen Devoid   On: 06/14/2018 12:10   Dg Chest Port 1 View  Result Date: 06/13/2018 CLINICAL DATA:  78 year old female with shortness of breath. EXAM: PORTABLE CHEST 1 VIEW COMPARISON:  06/12/2018 chest radiographs. CT Abdomen and Pelvis yesterday. FINDINGS: Portable AP upright view at 1950 hours. Dense new left lung base opacity obscuring the hemidiaphragm. Patchy right lung base opacity appearing similar to the CT yesterday. Increased pulmonary vascularity from the radiographs yesterday. Stable mild cardiomegaly. Other mediastinal contours are within normal limits. Visualized tracheal air column is within normal limits. No pneumothorax. Paucity of bowel gas in the upper abdomen. IMPRESSION: 1. New pulmonary interstitial edema and small left pleural effusion suspected. 2. Mild chronic cardiomegaly. 3. Patchy right lung base opacity appears similar to the atelectasis on the CT yesterday. Electronically Signed   By: Genevie Ann M.D.   On: 06/13/2018 20:07  Assessment: 78 year old female admitted with acute pancreatitis, associated elevated transaminases and bilirubin, no evidence for gallstones or acute cholecystitis. No CBD dilatation. 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 She has  been on supplemental oxygen since admission which is new to her. She experienced subjective worsening shortness of breath and CXR was repeated with new pulmonary interstitial edema and small left pleural effusion suspected. Hospitalist has been diuresing patient but subjectively patient is still feeling short of breath, but she is stating 98% on 3L nasal canula yesterday. Fluids were discontinued yesterday and repeat CXR yesterday with mild interstitial edema with small bilateral pleural effusions.    Acute pancreatitis is much improved. Labs yesterday with Lipase 32 (normal), AST normalized at 24, ALT improved at 62; WBC 10.5. Hemglobin 13.1 and hematocrit 40.4. Tachycardia (in the setting of dyspnea and anxiety) improved.   Yesterday her abdominal pain was improved. She was subsequently advanced to clear liquid diet yesterday. This morning she had recurrent pain with clear liquids with soda. States she ate more this morning than usual, although she didn't appear to eat a whole lot; states she doesn't eat much normally. At first pain seemed to be significant, but then she stated it was not near as bad as when she came in, and seemed somewhat different. No bowel movement since admission but minimal intake. Query gas pain versus too early to attempt diet. Per nursing breathing is significantly better; live tele shows 94% sat and HR 90-95.  Plan: 1. Simethicone x 1 does ? Improvement in abdominal pain 2. Hold remaining breakfast tray this morning 3. Re-challenge clear liquids this afternoon; avoid carbonated beverages 4. CMP in the morning 5. Pain management per existing orders 6. Will likely need outpatient evaluation for cirrhosis given abdominal imaging results.   Thank you for allowing us to participate in the care of Heather Oconnor  Wynne DustEric Gill, DNP, AGNP-C Adult & Gerontological Nurse Practitioner [facility]     LOS: 3 days    06/15/2018, 7:42 AM

## 2018-06-15 NOTE — Progress Notes (Signed)
PROGRESS NOTE    Heather Oconnor  LFY:[id number] DOB: 1940-03-15 DOA: 06/12/2018 PCP: Dione Housekeeper, MD   Brief Narrative:  Per HPI: Heather Oconnor is a 78 y.o. female with medical history significant for GERD, IBS, hemorrhoids, hypertension, type 2 diabetes, prior TIA, prior tobacco abuse, and obesity who presented with epigastric as well as some generalized abdominal pain that began day prior to admission.  This was associated with intermittent episodes of nausea and vomiting and some questionable diarrhea.  She denies any hematemesis or dark stools or bloody stools.  She denies any fevers or chills and has no chest pain, shortness of breath, or cough.  She denies any radiation of the pain otherwise and has had nothing to eat or drink the day prior to admission. She denies any new medications and denies alcohol use. 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 CHF, now patient has volume overload in the setting of IV fluids for pancreatitis, EF 55 to 60%, IV diuresis as ordered, supplemental oxygen, chest x-ray from 06/14/2018 continues to show pulmonary venous congestion/pulmonary edema with bilateral effusions, appears to be diuresing well, fluid balance is negative weight not available  Acute hypoxic respiratory failure--- PTA patient was not on oxygen, hypoxia is due to volume overload in the setting of aggressive IV fluids for acute pancreatitis, continue supplemental oxygen and IV diuresis, consider CTA chest if respiratory symptoms persist despite adequate diuresis  Acute  pancreatitis-unknown etiology -Patient denies any alcohol use or prior episodes; appreciate GI consultation for assistance in management -Lipid panel without any significant triglyceride elevation -Trend CMP, lipase has normalized - advance diet as per GI service---still having difficulties with liquid diet -Pain management with IV Dilaudid as needed -Zofran for nausea and vomiting which has improved GI and general surgery recommends lap chole as small gallstones may be responsible for patient's pancreatitis..... Patient is thinking about it but she has not made a final decision as to whether she will undergo lap chole  HTN-BP improving, continue amlodipine 10 mg daily, continue Toprol-XL 100 mg daily IV hydralazine as needed  Transaminitis-downtrending -Follow LFTs-No signs of obstruction currently noted and this is likely related to pancreatitis  IBS with diarrhea -Appreciate GI evaluation -Does not appear to be infectious etiology  Type 2 diabetes with hyperglycemia-improved -hold home medications until oral intake is more reliable -SSI qAC and qHS before meals and nightly no  GERD -PO Protonix  Prior TIA/prior central retinal vein occlusion-- -Hold statin for now -c/n home aspirin, hold Plavix starting 06/24/2018 to allow for possible lap chole   DVT prophylaxis: Lovenox Code Status: Full Family Communication: Spoke with patient's husband on phone on 06/15/18-  Disposition Plan: Currently requiring IV diuresis  Consultants:   GI/general surgery  Procedures:   None  Antimicrobials:   None   Subjective: Continues to require oxygen, Dr. Constance Haw and Randall Hiss from GI service at bedside.... Had nausea and abdominal discomfort with liquid diet,.... no Chest pain  Objective: Vitals:   06/14/18 2156 06/15/18 0555 06/15/18 0757 06/15/18 1254  BP: (!) 177/83 (!) 178/71  (!) 147/69  Pulse: 98 91  82  Resp: 18 18  18   Temp: 98.3 F (36.8 C) 98.1 F (36.7 C)  97.7 F  (36.5 C)  TempSrc:  Oral  Oral  SpO2: 93% 95% 94% 94%  Weight:      Height:        Intake/Output Summary (Last 24 hours) at 06/15/2018 1739 Last data filed at 06/15/2018 1705 Gross per 24 hour  Intake 720 ml  Output 1900 ml  Net -1180 ml   Filed Weights   06/12/18 0735  Weight: 84 kg    Examination:   Physical Exam   Gen:- Awake Alert, in no acute distress  HEENT:- [place].AT, No sclera icterus Nose- Leonore 2 L/min Neck-Supple Neck,   Lungs-diminished in bases with faint bibasilar rales  CV- S1, S2 normal Abd-  +ve B.Sounds, Abd Soft, No tenderness,    Extremity/Skin:- 1+ edema,    Psych-affect is appropriate, oriented x3 Neuro-generalized weakness, but no new focal deficits, no tremors     Data Reviewed: I have personally reviewed following labs and imaging studies--- chest x-ray consistent with CHF/pulmonary venous congestion  CBC: Recent Labs  Lab 06/12/18 0815 06/13/18 0634 06/14/18 0544  WBC 14.3* 12.7* 10.5  NEUTROABS 13.1*  --   --   HGB 17.0* 14.2 13.1  HCT 49.5* 44.7 40.4  MCV 84.9 91.4 91.0  PLT 223 186 154   Basic Metabolic Panel: Recent Labs  Lab 06/12/18 0815 06/13/18 0634 06/14/18 0544 06/15/18 0501  NA 136 140 139 139  K 3.3* 3.7 3.6 3.9  CL 94* 107 101 99  CO2 23 23 27 29   GLUCOSE 334* 166* 166* 170*  BUN 21 23 16 18   CREATININE 0.90 0.73 0.61 0.55  CALCIUM 9.2 7.8* 7.9* 8.4*  MG  --  1.6* 2.1  --    GFR: Estimated Creatinine Clearance: 56.6 mL/min (by C-G formula based on SCr of 0.55 mg/dL). Liver Function Tests: Recent Labs  Lab 06/12/18 0815 06/13/18 0634 06/14/18 0544  AST 153* 46* 24  ALT 219* 103* 62*  ALKPHOS 48 39 41  BILITOT 2.1* 1.4* 1.0  PROT 6.9 6.0* 6.0*  ALBUMIN 4.0 3.1* 2.8*   Recent Labs  Lab 06/12/18 0815 06/13/18 0634 06/14/18 0544  LIPASE 1,169* 164* 32   No results for input(s): AMMONIA in the last 168 hours. Coagulation Profile: No results for input(s): INR, PROTIME in the last 168 hours.  Cardiac Enzymes: Recent Labs  Lab 06/12/18 0815  TROPONINI <0.03   BNP (last 3 results) No results for input(s): PROBNP in the last 8760 hours. HbA1C: No results for input(s): HGBA1C in the last 72 hours. CBG: Recent Labs  Lab 06/15/18 0556 06/15/18 0725 06/15/18 1100 06/15/18 1502 06/15/18 1609  GLUCAP 158* 166* 180* 154* 138*   Lipid Profile: No results for input(s): CHOL, HDL, LDLCALC, TRIG, CHOLHDL, LDLDIRECT in the last 72 hours. Thyroid Function Tests: No results for input(s): TSH, T4TOTAL, FREET4, T3FREE, THYROIDAB in the last 72 hours. Anemia Panel: No results for input(s): VITAMINB12, FOLATE, FERRITIN, TIBC, IRON, RETICCTPCT in the last 72 hours. Sepsis Labs: Recent Labs  Lab 06/12/18 0843 06/12/18 1038 06/14/18 0544  LATICACIDVEN 4.0* 3.6* 1.2    Recent Results (from the past 240 hour(s))  SARS Coronavirus 2 (CEPHEID - Performed in [facility]), Hosp Order     Status: None   Collection Time: 06/12/18  9:19 AM   Specimen: Nasopharyngeal Swab  Result Value Ref Range Status   SARS Coronavirus 2 NEGATIVE NEGATIVE Final  Comment: (NOTE) If result is NEGATIVE SARS-CoV-2 target nucleic acids are NOT DETECTED. The SARS-CoV-2 RNA is generally detectable in upper and lower  respiratory specimens during the acute phase of infection. The lowest  concentration of SARS-CoV-2 viral copies this assay can detect is 250  copies / mL. A negative result does not preclude SARS-CoV-2 infection  and should not be used as the sole basis for treatment or other  patient management decisions.  A negative result may occur with  improper specimen collection / handling, submission of specimen other  than nasopharyngeal swab, presence of viral mutation(s) within the  areas targeted by this assay, and inadequate number of viral copies  (<250 copies / mL). A negative result must be combined with clinical  observations, patient history, and epidemiological information.  If result is POSITIVE SARS-CoV-2 target nucleic acids are DETECTED. The SARS-CoV-2 RNA is generally detectable in upper and lower  respiratory specimens dur ing the acute phase of infection.  Positive  results are indicative of active infection with SARS-CoV-2.  Clinical  correlation with patient history and other diagnostic information is  necessary to determine patient infection status.  Positive results do  not rule out bacterial infection or co-infection with other viruses. If result is PRESUMPTIVE POSTIVE SARS-CoV-2 nucleic acids MAY BE PRESENT.   A presumptive positive result was obtained on the submitted specimen  and confirmed on repeat testing.  While 2019 novel coronavirus  (SARS-CoV-2) nucleic acids may be present in the submitted sample  additional confirmatory testing may be necessary for epidemiological  and / or clinical management purposes  to differentiate between  SARS-CoV-2 and other Sarbecovirus currently known to infect humans.  If clinically indicated additional testing with an alternate test  methodology [phone] is advised. The SARS-CoV-2 RNA is generally  detectable in upper and lower respiratory sp ecimens during the acute  phase of infection. The expected result is Negative. Fact Sheet for Patients:  BoilerBrush.com.cyhttps://www.fda.gov/media/136312/download Fact Sheet for Healthcare Providers: https://pope.com/https://www.fda.gov/media/136313/download This test is not yet approved or cleared by the [place] FDA and has been authorized for detection and/or diagnosis of SARS-CoV-2 by FDA under an Emergency Use Authorization (EUA).  This EUA will remain in effect (meaning this test can be used) for the duration of the COVID-19 declaration under Section 564(b)(1) of the Act, 21 U.S.C. section 360bbb-3(b)(1), unless the authorization is terminated or revoked sooner. Performed at [facility], [address] [place], [place] [address]   Urine culture     Status: None   Collection Time:  06/12/18 11:41 AM   Specimen: Urine, Catheterized  Result Value Ref Range Status   Specimen Description   Final    URINE, CATHETERIZED Performed at [facility], [address] [place], [place] [address]    Special Requests   Final    NONE Performed at [facility], [address] [place], [place] [address]    Culture   Final    NO GROWTH Performed at [facility], [address] [place], [place] [address]    Report Status 06/13/2018 FINAL  Final      Radiology Studies: Dg Chest Port 1 View  Result Date: 06/14/2018 CLINICAL DATA:  Short of breath for several days EXAM: PORTABLE CHEST 1 VIEW COMPARISON:  06/13/2018 FINDINGS: Small bilateral pleural effusions. Mild bilateral interstitial thickening. No pneumothorax. Stable cardiomegaly. No acute osseous abnormality. IMPRESSION: 1. Mild interstitial edema with small bilateral pleural effusions. Electronically Signed   By: Elige KoHetal  Patel   On: 06/14/2018  12:10   Dg Chest Port 1 View  Result Date: 06/13/2018 CLINICAL DATA:  78 year old female with shortness of breath. EXAM: PORTABLE CHEST 1 VIEW COMPARISON:  06/12/2018 chest radiographs. CT Abdomen and Pelvis yesterday. FINDINGS: Portable AP upright view at 1950 hours. Dense new left lung base opacity obscuring the hemidiaphragm. Patchy right lung base opacity appearing similar to the CT yesterday. Increased pulmonary vascularity from the radiographs yesterday. Stable mild cardiomegaly. Other mediastinal contours are within normal limits. Visualized tracheal air column is within normal limits. No pneumothorax. Paucity of bowel gas in the upper abdomen. IMPRESSION: 1. New pulmonary interstitial edema and small left pleural effusion suspected. 2. Mild chronic cardiomegaly. 3. Patchy right lung base opacity appears similar to the atelectasis on the CT yesterday. Electronically Signed   By: Odessa FlemingH  Hall M.D.   On: 06/13/2018 20:07     Scheduled Meds: . amLODipine  10 mg Oral Daily  . aspirin   81 mg Oral Daily  . enoxaparin (LOVENOX) injection  40 mg Subcutaneous Q24H  . furosemide  20 mg Intravenous Once  . hydrALAZINE  50 mg Oral TID  . insulin aspart  0-15 Units Subcutaneous Q6H  . insulin aspart  0-5 Units Subcutaneous QHS  . isosorbide mononitrate  30 mg Oral Daily  . metoprolol succinate  100 mg Oral Daily  . pantoprazole  40 mg Oral Daily   Continuous Infusions:    LOS: 3 days      Che Below, [facility] If 7PM-7AM, please contact night-coverage [email or web address] Password [email or web address] 06/15/2018, 5:39 PM

## 2018-06-15 NOTE — Consult Note (Signed)
[facility] Consult  Reason for Consult: Pancreatitis  Referring Physician:  Dr. Denton Brick   Chief Complaint    Abdominal Pain      Heather Oconnor is a 78 y.o. female.  HPI: Heather Oconnor is a 78 yo who has DM, GERD, HTN, and came into Heather hospital with acute onset of abdominal pain on 06/12/2018 and some nausea and vomiting. Heather Oconnor was worked up and noted to have pancreatitis with a lipase to 1169.  Heather Oconnor had no gallstones noted on [place] but her LFTs were elevated and have trended down. Heather Oconnor also has had some respiratory distress with shortness of breath and required some lasix on admission. Heather Oconnor currently is sitting in bed and has been tolerating a clear diet. Heather Oconnor has no complaints and is 3L [place].  Heather Oconnor says that her abdomen is hurting a little more since breakfast.  Her imaging is also concerning for cirrhosis of Heather liver which is a new diagnosis/ finding.     Past Medical History:  Diagnosis Date  . Diabetes ([place])   . GERD (gastroesophageal reflux disease)   . Hemorrhoids   . Hypertension   . IBS (irritable bowel syndrome)     Past Surgical History:  Procedure Laterality Date  . COLONOSCOPY  09/07/2007   RMR: anal canal/external hemorrhoids, redundant colon, left-sided diverticula, otherwise normal colonic mucosa    Family History  Problem Relation Age of Onset  . Colon cancer Neg Hx     Social History   Tobacco Use  . Smoking status: Former Smoker    Years: 9.00    Quit date: 11/27/2006    Years since quitting: 11.5  . Smokeless tobacco: Never Used  Substance Use Topics  . Alcohol use: No    Alcohol/week: 0.0 standard drinks  . Drug use: No    Medications:  I have reviewed Heather Oconnor's current medications. Prior to Admission:  Medications Prior to Admission  Medication Sig Dispense Refill Last Dose  . acetaminophen (TYLENOL) 500 MG tablet Take 1,000 mg by mouth every 6 (six) hours as needed.   06/11/2018 at Unknown time  . amLODipine (NORVASC) 10  MG tablet Take 10 mg by mouth daily.    06/11/2018 at Unknown time  . aspirin 81 MG chewable tablet Chew 81 mg by mouth daily.    06/11/2018 at Unknown time  . clopidogrel (PLAVIX) 75 MG tablet Take 75 mg by mouth daily.    06/11/2018 at Unknown time  . DORZOLAMIDE HCL-TIMOLOL MAL OP Apply 1 drop to eye daily.    06/11/2018 at Unknown time  . glipiZIDE (GLUCOTROL XL) 5 MG 24 hr tablet Take 1 tablet by mouth daily.   06/11/2018 at Unknown time  . latanoprost (XALATAN) 0.005 % ophthalmic solution Place 1 drop into Heather right eye at bedtime.    06/11/2018 at Unknown time  . lisinopril-hydrochlorothiazide (PRINZIDE,ZESTORETIC) 20-12.5 MG tablet Take 1 tablet by mouth daily.    06/11/2018 at Unknown time  . loperamide (IMODIUM) 2 MG capsule Take 2 mg by mouth daily. Will take an additional if needed   unknown at Unknown time  . metFORMIN (GLUCOPHAGE) 500 MG tablet Take 1,000 mg by mouth daily.    06/11/2018 at Unknown time  . metoprolol succinate (TOPROL-XL) 100 MG 24 hr tablet Take 100 mg by mouth daily.    06/11/2018 at 0600am   Scheduled: . amLODipine  10 mg Oral Daily  . aspirin  81 mg Oral Daily  . clopidogrel  75 mg  Oral Daily  . enoxaparin (LOVENOX) injection  40 mg Subcutaneous Q24H  . hydrALAZINE  50 mg Oral TID  . insulin aspart  0-15 Units Subcutaneous Q6H  . insulin aspart  0-5 Units Subcutaneous QHS  . isosorbide mononitrate  30 mg Oral Daily  . metoprolol succinate  100 mg Oral Daily  . pantoprazole  40 mg Oral Daily   Continuous:  ZOX:[id number]:labetalol, levalbuterol, ondansetron **OR** ondansetron (ZOFRAN) IV, oxyCODONE  No Known Allergies   ROS:  A comprehensive review of systems was negative except for: Respiratory: positive for SOB Gastrointestinal: positive for abdominal pain, nausea and vomiting  Blood pressure (!) 178/71, pulse 91, temperature 98.1 F (36.7 C), temperature source Oral, resp. rate 18, height 5' (1.524 m), weight 84 kg, SpO2 94 %. Physical Exam Constitutional:      Comments:  A little sleepy but wakes up easily  HENT:     Head: Normocephalic.  Eyes:     Extraocular Movements: Extraocular movements intact.  Cardiovascular:     Rate and Rhythm: Normal rate.  Pulmonary:     Effort: Pulmonary effort is normal.     Comments: Shallow appearing breaths, on 3L [place]  Abdominal:     General: There is distension.     Palpations: Abdomen is soft.     Tenderness: There is abdominal tenderness in Heather right upper quadrant and epigastric area.  Skin:    General: Skin is warm and dry.  Neurological:     General: No focal deficit present.     Mental Status: Heather Oconnor is alert and oriented to person, place, and time.  Psychiatric:        Mood and Affect: Mood normal.        Behavior: Behavior normal.    Results: Results for orders placed or performed during Heather hospital encounter of 06/12/18 (from Heather past 48 hour(s))  Glucose, capillary     Status: Abnormal   Collection Time: 06/13/18 11:16 AM  Result Value Ref Range   Glucose-Capillary 156 (H) 70 - 99 mg/dL  Glucose, capillary     Status: Abnormal   Collection Time: 06/13/18  2:08 PM  Result Value Ref Range   Glucose-Capillary 148 (H) 70 - 99 mg/dL  Glucose, capillary     Status: Abnormal   Collection Time: 06/13/18  5:05 PM  Result Value Ref Range   Glucose-Capillary 126 (H) 70 - 99 mg/dL  Glucose, capillary     Status: Abnormal   Collection Time: 06/13/18  8:31 PM  Result Value Ref Range   Glucose-Capillary 113 (H) 70 - 99 mg/dL   Comment 1 Notify RN    Comment 2 Document in Chart   Glucose, capillary     Status: Abnormal   Collection Time: 06/14/18 12:11 AM  Result Value Ref Range   Glucose-Capillary 118 (H) 70 - 99 mg/dL  Comprehensive metabolic panel     Status: Abnormal   Collection Time: 06/14/18  5:44 AM  Result Value Ref Range   Sodium 139 135 - 145 mmol/L   Potassium 3.6 3.5 - 5.1 mmol/L   Chloride 101 98 - 111 mmol/L   CO2 27 22 - 32 mmol/L   Glucose, Bld 166 (H) 70 - 99 mg/dL   BUN 16 8 - 23  mg/dL   Creatinine, Ser 8.110.61 0.44 - 1.00 mg/dL   Calcium 7.9 (L) 8.9 - 10.3 mg/dL   Total Protein 6.0 (L) 6.5 - 8.1 g/dL   Albumin 2.8 (L) 3.5 - 5.0 g/dL  AST 24 15 - 41 U/L   ALT 62 (H) 0 - 44 U/L   Alkaline Phosphatase 41 38 - 126 U/L   Total Bilirubin 1.0 0.3 - 1.2 mg/dL   GFR calc non Af Amer >60 >60 mL/min   GFR calc Af Amer >60 >60 mL/min   Anion gap 11 5 - 15    Comment: Performed at [facility], [address] [place], [place] [address]  Magnesium     Status: None   Collection Time: 06/14/18  5:44 AM  Result Value Ref Range   Magnesium 2.1 1.7 - 2.4 mg/dL    Comment: Performed at [facility], [address] [place], [place] [address]  Lipase, blood     Status: None   Collection Time: 06/14/18  5:44 AM  Result Value Ref Range   Lipase 32 11 - 51 U/L    Comment: Performed at [facility], [address] [place], [place] [address]  CBC     Status: None   Collection Time: 06/14/18  5:44 AM  Result Value Ref Range   WBC 10.5 4.0 - 10.5 K/uL   RBC 4.44 3.87 - 5.11 MIL/uL   Hemoglobin 13.1 12.0 - 15.0 g/dL   HCT 91.440.4 78.236.0 - 95.646.0 %   MCV 91.0 80.0 - 100.0 fL   MCH 29.5 26.0 - 34.0 pg   MCHC 32.4 30.0 - 36.0 g/dL   RDW 21.314.7 08.611.5 - 57.815.5 %   Platelets 154 150 - 400 K/uL   nRBC 0.0 0.0 - 0.2 %    Comment: Performed at [facility], [address] [place], [place] [address]  Lactic acid, plasma     Status: None   Collection Time: 06/14/18  5:44 AM  Result Value Ref Range   Lactic Acid, Venous 1.2 0.5 - 1.9 mmol/L    Comment: Performed at [facility], [address] [place], [place] [address]  Glucose, capillary     Status: Abnormal   Collection Time: 06/14/18  5:45 AM  Result Value Ref Range   Glucose-Capillary 145 (H) 70 - 99 mg/dL  Glucose, capillary     Status: Abnormal   Collection Time: 06/14/18  7:50 AM  Result Value Ref Range   Glucose-Capillary 167 (H) 70 - 99 mg/dL  Blood gas, arterial     Status: Abnormal   Collection Time: 06/14/18  8:05 AM  Result Value  Ref Range   FIO2 40.00    pH, Arterial 7.452 (H) 7.350 - 7.450   pCO2 arterial 40.1 32.0 - 48.0 mmHg   pO2, Arterial 88.7 83.0 - 108.0 mmHg   Bicarbonate 27.8 20.0 - 28.0 mmol/L   Acid-Base Excess 3.8 (H) 0.0 - 2.0 mmol/L   O2 Saturation 97.5 %   Oconnor temperature 37.0    Allens test (pass/fail) PASS PASS    Comment: Performed at [facility], [address] [place], [place] [address]  Glucose, capillary     Status: Abnormal   Collection Time: 06/14/18 11:03 AM  Result Value Ref Range   Glucose-Capillary 148 (H) 70 - 99 mg/dL  Glucose, capillary     Status: Abnormal   Collection Time: 06/14/18  2:35 PM  Result Value Ref Range   Glucose-Capillary 141 (H) 70 - 99 mg/dL  Glucose, capillary     Status: Abnormal   Collection Time: 06/14/18  4:04 PM  Result Value Ref Range   Glucose-Capillary 142 (H) 70 - 99 mg/dL  Glucose, capillary     Status: Abnormal  Collection Time: 06/15/18 12:26 AM  Result Value Ref Range   Glucose-Capillary 142 (H) 70 - 99 mg/dL  Basic metabolic panel     Status: Abnormal   Collection Time: 06/15/18  5:01 AM  Result Value Ref Range   Sodium 139 135 - 145 mmol/L   Potassium 3.9 3.5 - 5.1 mmol/L   Chloride 99 98 - 111 mmol/L   CO2 29 22 - 32 mmol/L   Glucose, Bld 170 (H) 70 - 99 mg/dL   BUN 18 8 - 23 mg/dL   Creatinine, Ser 1.61 0.44 - 1.00 mg/dL   Calcium 8.4 (L) 8.9 - 10.3 mg/dL   GFR calc non Af Amer >60 >60 mL/min   GFR calc Af Amer >60 >60 mL/min   Anion gap 11 5 - 15    Comment: Performed at [facility], [address], [place], [place] [address]  Glucose, capillary     Status: Abnormal   Collection Time: 06/15/18  5:56 AM  Result Value Ref Range   Glucose-Capillary 158 (H) 70 - 99 mg/dL  Glucose, capillary     Status: Abnormal   Collection Time: 06/15/18  7:25 AM  Result Value Ref Range   Glucose-Capillary 166 (H) 70 - 99 mg/dL   Reviewed CT - Inflammation around pancreas, distended gallbladder no signs of stones IMPRESSION: Changes  consistent with pancreatitis and peripancreatic inflammatory change.  Decreased attenuation of Heather liver consistent with underlying cirrhosis. Mild ascites is noted which may be related to Heather cirrhosis.  Bibasilar atelectasis right greater than left.  Chronic changes as described above.  [place] RUQ  CLINICAL DATA:  Elevated LFTs.  Personal history of cirrhosis.  EXAM: ULTRASOUND ABDOMEN LIMITED RIGHT UPPER QUADRANT  COMPARISON:  None.  FINDINGS: Gallbladder:  No gallstones or wall thickening visualized. No sonographic Murphy sign noted by sonographer.  Common bile duct:  Diameter: 6.3 mm, upper limits of normal  Liver:  Heather liver is diffusely echogenic. Internal architecture is lost. No discrete lesions are present. Sensitivity is decreased due to hyperechoic parenchyma and body habitus. A small amount of free fluid is evident. Portal vein is patent on color Doppler imaging with normal direction of blood flow towards Heather liver.  IMPRESSION: 1. Normal sonographic appearance of Heather gallbladder. 2. Heather liver is diffusely echogenic. Findings are consistent with Heather given diagnosis of cirrhosis. No discrete lesion is present. 3. Ascites.  Dg Chest Port 1 View  Result Date: 06/14/2018 CLINICAL DATA:  Short of breath for several days EXAM: PORTABLE CHEST 1 VIEW COMPARISON:  06/13/2018 FINDINGS: Small bilateral pleural effusions. Mild bilateral interstitial thickening. No pneumothorax. Stable cardiomegaly. No acute osseous abnormality. IMPRESSION: 1. Mild interstitial edema with small bilateral pleural effusions. Electronically Signed   By: Elige Ko   On: 06/14/2018 12:10   Dg Chest Port 1 View  Result Date: 06/13/2018 CLINICAL DATA:  78 year old female with shortness of breath. EXAM: PORTABLE CHEST 1 VIEW COMPARISON:  06/12/2018 chest radiographs. CT Abdomen and Pelvis yesterday. FINDINGS: Portable AP upright view at 1950 hours. Dense new left lung base opacity  obscuring Heather hemidiaphragm. Patchy right lung base opacity appearing similar to Heather CT yesterday. Increased pulmonary vascularity from Heather radiographs yesterday. Stable mild cardiomegaly. Other mediastinal contours are within normal limits. Visualized tracheal air column is within normal limits. No pneumothorax. Paucity of bowel gas in Heather upper abdomen. IMPRESSION: 1. New pulmonary interstitial edema and small left pleural effusion suspected. 2. Mild chronic cardiomegaly. 3. Patchy right lung base opacity appears similar  to Heather atelectasis on Heather CT yesterday. Electronically Signed   By: Odessa FlemingH  Hall M.D.   On: 06/13/2018 20:07     Assessment & Plan:  Heather Oconnor is a 78 y.o. female with pancreatitis. Heather Oconnor does not drink, has not had a medication change to cause her pancreatitis. 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   -While I was speaking to Heather Oconnor about Heather risk of recurrence of pancreatitis in Heather upcoming weeks, Heather option of cholecystectomy and liver biopsy, Heather risk of cholecystectomy and liver biopsy, Wynne DustEric Gill NP and Dr. Mariea ClontsEmokpae came into Heather room. Heather Oconnor is expressing hesitation to have her gallbladder out. Heather Oconnor wants Dr. Mariea ClontsEmokpae to speak with her husband. -Heather Oconnor has also been on Plavix since admission and receive Heather dose this Am 6/11.  So Heather Oconnor would not be able to have surgery for 5 days, Wednesday at Heather earliest, pending Heather OR schedule etc.  -Dr. Mariea ClontsEmokpae is going to let me know what Heather Oconnor and her husband decide   All questions were answered to Heather satisfaction of Heather Oconnor.    Lucretia RoersLindsay C Bridges 06/15/2018, 10:11 AM

## 2018-06-16 ENCOUNTER — Inpatient Hospital Stay (HOSPITAL_COMMUNITY): Payer: Medicare HMO

## 2018-06-16 ENCOUNTER — Encounter (HOSPITAL_COMMUNITY)
Admission: EM | Disposition: A | Discharge status: Home Health | Payer: Self-pay | Source: Home / Self Care | Attending: Family Medicine

## 2018-06-16 DIAGNOSIS — E119 Type 2 diabetes mellitus without complications: Secondary | ICD-10-CM

## 2018-06-16 LAB — CBC
HCT: 41 % (ref 36.0–46.0)
Hemoglobin: 13.6 g/dL (ref 12.0–15.0)
MCH: 29.2 pg (ref 26.0–34.0)
MCHC: 33.2 g/dL (ref 30.0–36.0)
MCV: 88.2 fL (ref 80.0–100.0)
Platelets: 177 10*3/uL (ref 150–400)
RBC: 4.65 MIL/uL (ref 3.87–5.11)
RDW: 14.6 % (ref 11.5–15.5)
WBC: 11.3 10*3/uL — ABNORMAL HIGH (ref 4.0–10.5)
nRBC: 0 % (ref 0.0–0.2)

## 2018-06-16 LAB — COMPREHENSIVE METABOLIC PANEL
ALT: 38 U/L (ref 0–44)
AST: 24 U/L (ref 15–41)
Albumin: 2.5 g/dL — ABNORMAL LOW (ref 3.5–5.0)
Alkaline Phosphatase: 53 U/L (ref 38–126)
Anion gap: 11 (ref 5–15)
BUN: 22 mg/dL (ref 8–23)
CO2: 29 mmol/L (ref 22–32)
Calcium: 8.3 mg/dL — ABNORMAL LOW (ref 8.9–10.3)
Chloride: 98 mmol/L (ref 98–111)
Creatinine, Ser: 0.51 mg/dL (ref 0.44–1.00)
GFR calc Af Amer: >60 mL/min (ref >60)
GFR calc non Af Amer: >60 mL/min (ref >60)
Glucose, Bld: 168 mg/dL — ABNORMAL HIGH (ref 70–99)
Potassium: 3.2 mmol/L — ABNORMAL LOW (ref 3.5–5.1)
Sodium: 138 mmol/L (ref 135–145)
Total Bilirubin: 1.1 mg/dL (ref 0.3–1.2)
Total Protein: 6.1 g/dL — ABNORMAL LOW (ref 6.5–8.1)

## 2018-06-16 LAB — GLUCOSE, CAPILLARY
Glucose-Capillary: 193 mg/dL — ABNORMAL HIGH (ref 70–99)
Glucose-Capillary: 153 mg/dL — ABNORMAL HIGH (ref 70–99)
Glucose-Capillary: 185 mg/dL — ABNORMAL HIGH (ref 70–99)
Glucose-Capillary: 160 mg/dL — ABNORMAL HIGH (ref 70–99)

## 2018-06-16 SURGERY — LAPAROSCOPIC CHOLECYSTECTOMY
Anesthesia: General

## 2018-06-16 MED ORDER — UMECLIDINIUM BROMIDE 62.5 MCG/INH IN AEPB
1.0000 | INHALATION_SPRAY | Freq: Every day | RESPIRATORY_TRACT | Status: DC
  Administered 2018-06-17 – 2018-06-18 (×2): 1 via RESPIRATORY_TRACT
  Filled 2018-06-16: qty 7

## 2018-06-16 MED ORDER — TIOTROPIUM BROMIDE MONOHYDRATE 18 MCG IN CAPS: 18.0000 ug | ORAL_CAPSULE | Freq: Every day | RESPIRATORY_TRACT | Status: DC

## 2018-06-16 MED ORDER — ISOSORBIDE MONONITRATE ER 60 MG PO TB24
60.0000 mg | ORAL_TABLET | Freq: Every day | ORAL | Status: DC
  Administered 2018-06-16 – 2018-06-18 (×3): 60 mg via ORAL
  Filled 2018-06-16 (×2): qty 1

## 2018-06-16 MED ORDER — INSULIN ASPART 100 UNIT/ML ~~LOC~~ SOLN
0.0000 [IU] | Freq: Three times a day (TID) | SUBCUTANEOUS | Status: DC
  Administered 2018-06-17 (×2): 3 [IU] via SUBCUTANEOUS
  Administered 2018-06-17: 2 [IU] via SUBCUTANEOUS
  Administered 2018-06-18: 5 [IU] via SUBCUTANEOUS

## 2018-06-16 MED ORDER — POTASSIUM CHLORIDE CRYS ER 20 MEQ PO TBCR
40.0000 meq | EXTENDED_RELEASE_TABLET | Freq: Once | ORAL | Status: AC
  Administered 2018-06-16: 40 meq via ORAL
  Filled 2018-06-16: qty 2

## 2018-06-16 MED ORDER — FUROSEMIDE 10 MG/ML IJ SOLN
40.0000 mg | Freq: Once | INTRAMUSCULAR | Status: AC
  Administered 2018-06-16: 40 mg via INTRAVENOUS

## 2018-06-16 MED ORDER — FUROSEMIDE 10 MG/ML IJ SOLN
INTRAMUSCULAR | Status: AC
  Filled 2018-06-16: qty 4

## 2018-06-16 MED ORDER — HYDRALAZINE HCL 25 MG PO TABS
100.0000 mg | ORAL_TABLET | Freq: Three times a day (TID) | ORAL | Status: DC
  Administered 2018-06-16 – 2018-06-18 (×7): 100 mg via ORAL
  Filled 2018-06-16 (×7): qty 4

## 2018-06-16 MED ORDER — IOHEXOL 350 MG/ML SOLN
100.0000 mL | Freq: Once | INTRAVENOUS | Status: AC | PRN
  Administered 2018-06-16: 100 mL via INTRAVENOUS

## 2018-06-16 NOTE — Plan of Care (Signed)
  Problem: Acute Rehab PT Goals(only PT should resolve) Goal: Pt Will Go Supine/Side To Sit Outcome: Progressing Flowsheets (Taken 06/16/2018 1504) Pt will go Supine/Side to Sit: with supervision Goal: Patient Will Transfer Sit To/From Stand Outcome: Progressing Flowsheets (Taken 06/16/2018 1504) Patient will transfer sit to/from stand: with supervision Goal: Pt Will Transfer Bed To Chair/Chair To Bed Outcome: Progressing Flowsheets (Taken 06/16/2018 1504) Pt will Transfer Bed to Chair/Chair to Bed: with supervision Goal: Pt Will Ambulate Outcome: Progressing Flowsheets (Taken 06/16/2018 1504) Pt will Ambulate:  100 feet  with minimal assist  with rolling walker   3:04 PM, 06/16/18 Talbot Grumbling, DPT Physical Therapist with [facility] [phone] office

## 2018-06-16 NOTE — Evaluation (Signed)
Physical Therapy Evaluation Patient Details Name: MARIATERESA BATRA MRN: [id number] DOB: [birth year] Today's Date: 06/16/2018   History of Present Illness  LAKENA SPARLIN is a 78 y.o. female with medical history significant for GERD, IBS, hemorrhoids, hypertension, type 2 diabetes, prior TIA, prior tobacco abuse, and obesity who presented with epigastric as well as some generalized abdominal pain that began earlier yesterday.  This was associated with intermittent episodes of nausea and vomiting and some questionable diarrhea.  She denies any hematemesis or dark stools or bloody stools.  She denies any fevers or chills and has no chest pain, shortness of breath, or cough.  She denies any radiation of the pain otherwise and has had nothing to eat or drink since yesterday morning.  She denies any new medications and denies alcohol use.    Clinical Impression  Pt deconditioned and with generalized weakness requiring increased time and min assist for bed mobility and transfers. Pt limited with gait, able to perform standing marching for 30 sec requiring seated rest break after. Pt able to take slow, labored steps at bedside requiring assistance with RW. Pt previously ind with all ADLs, ambulating unlimited distances in community, driving, and owns no DME or home O2. Pt on 2.5LPM and O2 sat <93% throughout treatment session demonstrating mild to moderate SOB. Pt pleasant and determined to get home, unhappy about SNF conversation and determined to improve medically and physically in order to discharge straight home. Pt tolerates remaining up in chair, on 2.5 LPM O2 and O2 sat 97%, call bell in lap and educated to call nursing when ready to get back to bed. RN notified of pt's evaluation and verbalized understanding.    Follow Up Recommendations SNF;Supervision/Assistance - 24 hour    Equipment Recommendations  Rolling walker with 5" wheels;3in1 (PT)    Recommendations for Other Services        Precautions / Restrictions Precautions Precautions: Fall Restrictions Weight Bearing Restrictions: No      Mobility  Bed Mobility Overal bed mobility: Needs Assistance Bed Mobility: Supine to Sit     Supine to sit: Min assist        Transfers Overall transfer level: Needs assistance Equipment used: Rolling walker (2 wheeled) Transfers: Stand Pivot Transfers;Sit to/from Stand Sit to Stand: Min assist Stand pivot transfers: Min assist       General transfer comment: increased time, labored movement, initially unsteady improving with time, denies dizziness and mild shortness of breath noted  Ambulation/Gait Ambulation/Gait assistance: Min assist Gait Distance (Feet): 5 Feet Assistive device: Rolling walker (2 wheeled) Gait Pattern/deviations: Step-to pattern;Decreased step length - right;Decreased step length - left;Shuffle;Decreased stride length Gait velocity: decreased   General Gait Details: Slow, labored, shuffling steps at bedside requiring increased time, min assist with RW management, verbal cues for upright posture to avoid leaning on RW, unsteadiness improving with time, no loss of balance and mild shortness of breath  Stairs            Wheelchair Mobility    Modified Rankin (Stroke Patients Only)       Balance Overall balance assessment: Needs assistance Sitting-balance support: Feet supported;No upper extremity supported Sitting balance-Leahy Scale: Good Sitting balance - Comments: seated EOB   Standing balance support: During functional activity;Bilateral upper extremity supported Standing balance-Leahy Scale: Fair Standing balance comment: with RW                             Pertinent  Vitals/Pain Pain Assessment: No/denies pain    Home Living Family/patient expects to be discharged to:: Private residence Living Arrangements: Spouse/significant other(Spouse works 8hrs/day) Available Help at Discharge: Family;Friend(s) Type  of Home: House Home Access: Stairs to enter Entrance Stairs-Rails: None Secretary/administratorntrance Stairs-Number of Steps: 1 Home Layout: One level Home Equipment: None      Prior Function Level of Independence: Independent         Comments: Pt reports Ind with household and unlimited community distances, no AD, Ind with bathing, dressing, housework, Presenter, broadcastingyardwork, driving     Higher education careers adviserHand Dominance        Extremity/Trunk Assessment   Upper Extremity Assessment Upper Extremity Assessment: Generalized weakness    Lower Extremity Assessment Lower Extremity Assessment: Generalized weakness    Cervical / Trunk Assessment Cervical / Trunk Assessment: Kyphotic  Communication   Communication: No difficulties  Cognition Arousal/Alertness: Awake/alert Behavior During Therapy: WFL for tasks assessed/performed Overall Cognitive Status: Within Functional Limits for tasks assessed                                        General Comments      Exercises     Assessment/Plan    PT Assessment Patient needs continued PT services  PT Problem List Decreased strength;Decreased activity tolerance;Decreased balance;Decreased mobility       PT Treatment Interventions DME instruction;Gait training;Functional mobility training;Therapeutic activities;Therapeutic exercise;Balance training;Patient/family education    PT Goals (Current goals can be found in the Care Plan section)  Acute Rehab PT Goals Patient Stated Goal: return home with spouse PT Goal Formulation: With patient Time For Goal Achievement: 06/30/18 Potential to Achieve Goals: Good    Frequency Min 3X/week   Barriers to discharge        Co-evaluation               AM-PAC PT "6 Clicks" Mobility  Outcome Measure Help needed turning from your back to your side while in a flat bed without using bedrails?: A Little Help needed moving from lying on your back to sitting on the side of a flat bed without using bedrails?: A  Little Help needed moving to and from a bed to a chair (including a wheelchair)?: A Little Help needed standing up from a chair using your arms (e.g., wheelchair or bedside chair)?: A Little Help needed to walk in hospital room?: A Little Help needed climbing 3-5 steps with a railing? : Total 6 Click Score: 16    End of Session Equipment Utilized During Treatment: Gait belt;Oxygen(2.5 LPM) Activity Tolerance: Patient tolerated treatment well;Patient limited by fatigue Patient left: in chair;with call bell/phone within reach Nurse Communication: Mobility status PT Visit Diagnosis: Unsteadiness on feet (R26.81);Other abnormalities of gait and mobility (R26.89);Muscle weakness (generalized) (M62.81)    Time: [id number] PT Time Calculation (min) (ACUTE ONLY): 27 min   Charges:   PT Evaluation $PT Eval Moderate Complexity: 1 Mod PT Treatments $Therapeutic Activity: 8-22 mins        3:03 PM, 06/16/18 Domenick Bookbinderori Kupono Marling, DPT Physical Therapist with [facility] [phone] office

## 2018-06-16 NOTE — Care Management Important Message (Signed)
Important Message  Patient Details  Name: Heather Oconnor MRN: [id number] Date of Birth: September 13, 1940   Medicare Important Message Given:  Yes    Tommy Medal 06/16/2018, 1:25 PM

## 2018-06-16 NOTE — Progress Notes (Signed)
[facility] Progress Note     Subjective: Seems to have increased work of breathing. Seems tired. No abdominal pain complaints. Tolerating clears.   Objective: Vital signs in last 24 hours: Temp:  [97.7 F (36.5 C)-98.1 F (36.7 C)] 97.9 F (36.6 C) (06/12 0538) Pulse Rate:  [82-90] 90 (06/12 0538) Resp:  [18-19] 19 (06/12 0538) BP: (147-165)/(64-73) 165/73 (06/12 0538) SpO2:  [94 %-97 %] 97 % (06/12 0538) Weight:  [88.3 kg] 88.3 kg (06/12 0500) Last BM Date: 06/11/18  Intake/Output from previous day: 06/11 0701 - 06/12 0700 In: 840 [P.O.:840] Out: 1000 [Urine:1000] Intake/Output this shift: No intake/output data recorded.  General appearance: alert, cooperative and no distress Resp: increased work of breathing, O2 in place GI: soft, distended, mildly tender  Lab Results:  Recent Labs    06/14/18 0544 06/16/18 0458  WBC 10.5 11.3*  HGB 13.1 13.6  HCT 40.4 41.0  PLT 154 177   BMET Recent Labs    06/15/18 0501 06/16/18 0458  NA 139 138  K 3.9 3.2*  CL 99 98  CO2 29 29  GLUCOSE 170* 168*  BUN 18 22  CREATININE 0.55 0.51  CALCIUM 8.4* 8.3*    Assessment/Plan: Heather Oconnor is a 78 yo with pancreatitis. Possibly from microlithiasis but unable to determine. She also has some degree of cirrhosis, fatty liver.  Have discussed the option of lap chole and liver biopsy with the patient. Dr. Denton Brick has discussed with her husband. Currently CT chest is pending. Patient off plavix starting today. Ultimately given her respiratory status and overall reluctance, may need to postpone discussion for surgery until after these acute issues resolve. With the realization she is at risk for possible recurrence but unable to predict due to uncertainty of microlithiasis.   Discussed with Dr. Denton Brick.    LOS: 4 days    Virl Cagey 06/16/2018

## 2018-06-16 NOTE — Progress Notes (Addendum)
Subjective: Mild persistent abdominal pain. No N/V today. Having difficulty breathing (room sat 95% on O2 per [place]). No other GI complaints  Objective: Vital signs in last 24 hours: Temp:  [97.7 F (36.5 C)-98.1 F (36.7 C)] 97.9 F (36.6 C) (06/12 0538) Pulse Rate:  [82-90] 90 (06/12 0538) Resp:  [18-19] 19 (06/12 0538) BP: (147-165)/(64-73) 165/73 (06/12 0538) SpO2:  [94 %-97 %] 97 % (06/12 0538) Weight:  [88.3 kg] 88.3 kg (06/12 0500) Last BM Date: 06/11/18 General:   Alert and oriented, pleasant Head:  Normocephalic and atraumatic. Eyes:  No icterus, sclera clear. Conjuctiva pink.  Heart:  S1, S2 present, no murmurs noted.  Lungs: Clear to auscultation bilaterally, without wheezing, rales, or rhonchi. Cannot complete full sentences, appears to be having mild respiratory distress Abdomen:  Bowel sounds present, soft, Mild TTP, non-distended. No HSM or hernias noted. No rebound or guarding. No masses appreciated  Msk:  Symmetrical without gross deformities. Pulses:  Normal pulses noted. Extremities:  Without clubbing or edema. Neurologic:  Alert and  oriented x4 Psych:  Alert and cooperative. Normal mood and affect.  Intake/Output from previous day: 06/11 0701 - 06/12 0700 In: 840 [P.O.:840] Out: 1000 [Urine:1000] Intake/Output this shift: No intake/output data recorded.  Lab Results: Recent Labs    06/14/18 0544 06/16/18 0458  WBC 10.5 11.3*  HGB 13.1 13.6  HCT 40.4 41.0  PLT 154 177   BMET Recent Labs    06/14/18 0544 06/15/18 0501 06/16/18 0458  NA 139 139 138  K 3.6 3.9 3.2*  CL 101 99 98  CO2 27 29 29   GLUCOSE 166* 170* 168*  BUN 16 18 22   CREATININE 0.61 0.55 0.51  CALCIUM 7.9* 8.4* 8.3*   LFT Recent Labs    06/14/18 0544 06/16/18 0458  PROT 6.0* 6.1*  ALBUMIN 2.8* 2.5*  AST 24 24  ALT 62* 38  ALKPHOS 41 53  BILITOT 1.0 1.1   PT/INR No results for input(s): LABPROT, INR in the last 72 hours. Hepatitis Panel No results for input(s):  HEPBSAG, HCVAB, HEPAIGM, HEPBIGM in the last 72 hours.   Studies/Results: Dg Chest Port 1 View  Result Date: 06/14/2018 CLINICAL DATA:  Short of breath for several days EXAM: PORTABLE CHEST 1 VIEW COMPARISON:  06/13/2018 FINDINGS: Small bilateral pleural effusions. Mild bilateral interstitial thickening. No pneumothorax. Stable cardiomegaly. No acute osseous abnormality. IMPRESSION: 1. Mild interstitial edema with small bilateral pleural effusions. Electronically Signed   By: Kathreen Devoid   On: 06/14/2018 12:10    Assessment: 78 year old female admitted with acute pancreatitis, associated elevated transaminases and bilirubin, no evidence for gallstones or acute cholecystitis. No CBD dilatation.Lipid panelunimpressive.Etiology ikelytransient biliary obstructionrelated to microlithiasis.Dehydrated on admission, started on fluid resuscitation for dehydration and acute pancreatitis.She has been on supplemental oxygen since admission which is new to her. She experienced subjective worsening shortness of breath and CXR was repeated with new pulmonary interstitial edema and small left pleural effusion suspected.Hospitalist has been diuresing patient but subjectively patient was still feeling short of breath. Fluids were discontinued and repeat CXR yesterday with mild interstitial edema with small bilateral pleural effusions. Breathing improved yesterday.  Acute pancreatitis is much improved. Labs yesterday with Lipase 32 (normal), AST normalized at 24, ALT improved at 62; WBC 10.5. Hemglobin 13.1 and hematocrit 40.4. Tachycardia (in the setting of dyspnea and anxiety) improved.   Yesterday her abdominal pain was improved. She was subsequently advanced to clear liquid diet yesterday. This morning she had recurrent pain with  clear liquids with soda. States she ate more this morning than usual, although she didn't appear to eat a whole lot; states she doesn't eat much normally. At first pain seemed to  be significant, but then she stated it was not near as bad as when she came in, and seemed somewhat different. No bowel movement since admission but minimal intake. Recommended hold breakfast tray and re-challenge with clear liquid lunch. Appears she did better with lunch and she was kept on clears for the day with plans to reassess.  Today she is better from a GI perspective. Mild abdominal pain with palpation. No N/V. She appears drowsy and cannot complete full sentences. Concerned for ongoing respiratory distress and hospitalist was notified. CBC today is stable. CMP shows normalization of LFTs. Lipase previously normalized. She appears to still not be eating much on clear liquids, query eating little due to respiratory status.  Plan: 1. Recheck CMP tomorrow 2. Continue clear liquids as tolerated, advance as tolerated 3. Supportive measures 4. We will follow peripherally   Thank you for allowing us to participate in the care of Heather GalasElizabeth W Oconnor  Wynne DustEric Gill, DNP, AGNP-C Adult & Gerontological Nurse Practitioner [facility]     LOS: 4 days    06/16/2018, 8:12 AM   Attending note: Agree with above.  Dr. Karilyn Cotaehman will be available over the weekend as needed

## 2018-06-16 NOTE — Progress Notes (Signed)
Patient refused inhaler very lethargic. Probably could not have done anyway

## 2018-06-16 NOTE — Progress Notes (Signed)
Sat up for about 3 hours today, though complained about it.  Has had no vomiting but did c/o nausea this morning after taking medications. Sats have been in mid 90's on 2 liters and does seem short of breath at times.  Drinking clear liquids. Stated just now not in pain but requested pain medication to guard against pain.  Knows to request prn nebs if needed. Talked to daughter on phone

## 2018-06-16 NOTE — Progress Notes (Signed)
PROGRESS NOTE    Heather Oconnor  BPZ:[id number] DOB: Sep 26, 1940 DOA: 06/12/2018 PCP: Dione Housekeeper, MD   Assessment & Plan:   Principal Problem:   Acute pancreatitis Active Problems:   Essential hypertension   Transient cerebral ischemia   GERD   Irritable bowel syndrome   Type 2 diabetes mellitus without complication (HCC)   Elevated LFTs   Dyspnea and respiratory abnormalities  Brief Summary:- 78 y.o. female with medical history significant for GERD, IBS, hemorrhoids, hypertension, type 2 diabetes, prior TIA, prior tobacco abuse, and obesity admitted 06/12/2018 with abd , Nausea and Vomiting.Marland KitchenMarland KitchenShe was found to have acute nonalcoholic pancreatitis and elevated LFTs raising concerns about gallstones   A/p  1)HFpEF/ Acute on Chronic Diastolic Heart Failure---patient with history of chronic diastolic dysfunction CHF, now patient has volume overload in the setting of IV fluids for pancreatitis, echo this admission with EF 55 to 60%, , c/n supplemental oxygen,  fluid balance is negative,  weight is up to 83.3 kg from 84 kg on admission, continue IV diuresis  2) COPD/emphysema--- patient with 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, CT chest consistent with emphysema, no significant flareup at this time of her COPD, hold off on steroids, continue bronchodilators  3)Acute hypoxic respiratory failure--- CTA chest without pneumonia or acute PE, PTA patient was not on oxygen, hypoxia is due to combination of underlying emphysema and acute on chronic CHF flareup secondary to volume overload in the setting of aggressive IV fluids for acute pancreatitis, continue supplemental oxygen and IV diuresis,  4)Acute pancreatitis-unknown etiology -Patient denies any alcohol use or prior episodes; appreciate GI consultation for assistance in management -Lipid panel without any significant triglyceride elevation -Trend CMP, lipase has normalized - advance diet as per GI service---still having difficulties/abdominal  discomfort and nausea with liquid diet c/n PRN pain medications and antiemetics GI and general surgery recommends lap chole as small gallstones may be responsible for patient's pancreatitis..... Patient is thinking about it but she has not made a final decision as to whether she will undergo lap chole  5)HTN-BP improving, continue amlodipine 10 mg daily, continue Toprol-XL 100 mg daily IV hydralazine as needed  6)IBS with diarrhea -Appreciate GI evaluation -Does not appear to be infectious etiology  7)Type 2 diabetes with hyperglycemia-improved -hold home medications until oral intake is more reliable -SSI qAC and qHS before meals and nightly no  GERD -PO Protonix  Prior TIA/prior central retinal vein occlusion-- -Hold statin for now -c/n home aspirin, hold Plavix starting 06/24/2018 to allow for possible lap chole   DVT prophylaxis: Lovenox Code Status: Full Family Communication: Spoke with patient's husband on phone on 06/16/18-  Disposition Plan: Currently requiring IV diuresis, poor oral intake due to proctitis related abdominal pain and nausea  Consultants:   GI/general surgery  Procedures:   None  Antimicrobials:   None   Subjective:  Shortness of breath persist, no chest pains no palpitations, voiding okay  Objective: Vitals:   06/15/18 1254 06/15/18 2116 06/16/18 0500 06/16/18 0538  BP: (!) 147/69 (!) 150/64  (!) 165/73  Pulse: 82 84  90  Resp: 18 19  19   Temp: 97.7 F (36.5 C) 98.1 F (36.7 C)  97.9 F (36.6 C)  TempSrc: Oral     SpO2: 94% 94%  97%  Weight:   88.3 kg   Height:        Intake/Output Summary (Last 24 hours) at 06/16/2018 0812 Last data filed at 06/16/2018 0600 Gross per 24 hour  Intake 840 ml  Output 1000 ml  Net -160 ml   Filed Weights   06/12/18 0735 06/16/18 0500  Weight: 84 kg 88.3 kg    Examination:   Physical Exam   Gen:- Awake Alert, in no acute distress  HEENT:- [place].AT, No sclera icterus Nose- Capac 2 L/min  Neck-Supple Neck,   Lungs-diminished in bases with faint bibasilar rales , no wheezing CV- S1, S2 normal Abd-  +ve B.Sounds, Abd Soft, mild epigastric/periumbilical discomfort no significant tenderness,    Extremity/Skin:- 1+ edema,    Psych-affect is appropriate, oriented x3 Neuro-generalized weakness, but no new focal deficits, no tremors  CBC: Recent Labs  Lab 06/12/18 0815 06/13/18 0634 06/14/18 0544 06/16/18 0458  WBC 14.3* 12.7* 10.5 11.3*  NEUTROABS 13.1*  --   --   --   HGB 17.0* 14.2 13.1 13.6  HCT 49.5* 44.7 40.4 41.0  MCV 84.9 91.4 91.0 88.2  PLT 223 186 154 177   Basic Metabolic Panel: Recent Labs  Lab 06/12/18 0815 06/13/18 0634 06/14/18 0544 06/15/18 0501 06/16/18 0458  NA 136 140 139 139 138  K 3.3* 3.7 3.6 3.9 3.2*  CL 94* 107 101 99 98  CO2 23 23 27 29 29   GLUCOSE 334* 166* 166* 170* 168*  BUN 21 23 16 18 22   CREATININE 0.90 0.73 0.61 0.55 0.51  CALCIUM 9.2 7.8* 7.9* 8.4* 8.3*  MG  --  1.6* 2.1  --   --    GFR: Estimated Creatinine Clearance: 58.2 mL/min (by C-G formula based on SCr of 0.51 mg/dL). Liver Function Tests: Recent Labs  Lab 06/12/18 0815 06/13/18 0634 06/14/18 0544 06/16/18 0458  AST 153* 46* 24 24  ALT 219* 103* 62* 38  ALKPHOS 48 39 41 53  BILITOT 2.1* 1.4* 1.0 1.1  PROT 6.9 6.0* 6.0* 6.1*  ALBUMIN 4.0 3.1* 2.8* 2.5*   Recent Labs  Lab 06/12/18 0815 06/13/18 0634 06/14/18 0544  LIPASE 1,169* 164* 32   No results for input(s): AMMONIA in the last 168 hours. Coagulation Profile: No results for input(s): INR, PROTIME in the last 168 hours. Cardiac Enzymes: Recent Labs  Lab 06/12/18 0815  TROPONINI <0.03   BNP (last 3 results) No results for input(s): PROBNP in the last 8760 hours. HbA1C: No results for input(s): HGBA1C in the last 72 hours. CBG: Recent Labs  Lab 06/15/18 1100 06/15/18 1502 06/15/18 1609 06/15/18 2355 06/16/18 0602  GLUCAP 180* 154* 138* 151* 193*   Lipid Profile: No results for  input(s): CHOL, HDL, LDLCALC, TRIG, CHOLHDL, LDLDIRECT in the last 72 hours. Thyroid Function Tests: No results for input(s): TSH, T4TOTAL, FREET4, T3FREE, THYROIDAB in the last 72 hours. Anemia Panel: No results for input(s): VITAMINB12, FOLATE, FERRITIN, TIBC, IRON, RETICCTPCT in the last 72 hours. Sepsis Labs: Recent Labs  Lab 06/12/18 0843 06/12/18 1038 06/14/18 0544  LATICACIDVEN 4.0* 3.6* 1.2    Recent Results (from the past 240 hour(s))  SARS Coronavirus 2 (CEPHEID - Performed in [facility] hospital lab), Hosp Order     Status: None   Collection Time: 06/12/18  9:19 AM   Specimen: Nasopharyngeal Swab  Result Value Ref Range Status   SARS Coronavirus 2 NEGATIVE NEGATIVE Final    Comment: (NOTE) If result is NEGATIVE SARS-CoV-2 target nucleic acids are NOT DETECTED. The SARS-CoV-2 RNA is generally detectable in upper and lower  respiratory specimens during the acute phase of infection. The lowest  concentration of SARS-CoV-2 viral copies this assay can detect is 250  copies / mL. A  negative result does not preclude SARS-CoV-2 infection  and should not be used as the sole basis for treatment or other  patient management decisions.  A negative result may occur with  improper specimen collection / handling, submission of specimen other  than nasopharyngeal swab, presence of viral mutation(s) within the  areas targeted by this assay, and inadequate number of viral copies  (<250 copies / mL). A negative result must be combined with clinical  observations, patient history, and epidemiological information. If result is POSITIVE SARS-CoV-2 target nucleic acids are DETECTED. The SARS-CoV-2 RNA is generally detectable in upper and lower  respiratory specimens dur ing the acute phase of infection.  Positive  results are indicative of active infection with SARS-CoV-2.  Clinical  correlation with patient history and other diagnostic information is  necessary to determine patient  infection status.  Positive results do  not rule out bacterial infection or co-infection with other viruses. If result is PRESUMPTIVE POSTIVE SARS-CoV-2 nucleic acids MAY BE PRESENT.   A presumptive positive result was obtained on the submitted specimen  and confirmed on repeat testing.  While 2019 novel coronavirus  (SARS-CoV-2) nucleic acids may be present in the submitted sample  additional confirmatory testing may be necessary for epidemiological  and / or clinical management purposes  to differentiate between  SARS-CoV-2 and other Sarbecovirus currently known to infect humans.  If clinically indicated additional testing with an alternate test  methodology [id number]) is advised. The SARS-CoV-2 RNA is generally  detectable in upper and lower respiratory sp ecimens during the acute  phase of infection. The expected result is Negative. Fact Sheet for Patients:  BoilerBrush.com.cyhttps://www.fda.gov/media/136312/download Fact Sheet for Healthcare Providers: https://pope.com/https://www.fda.gov/media/136313/download This test is not yet approved or cleared by the [place] FDA and has been authorized for detection and/or diagnosis of SARS-CoV-2 by FDA under an Emergency Use Authorization (EUA).  This EUA will remain in effect (meaning this test can be used) for the duration of the COVID-19 declaration under Section 564(b)(1) of the Act, 21 U.S.C. section 360bbb-3(b)(1), unless the authorization is terminated or revoked sooner. Performed at [facility], [address] [place], [place] [address]   Urine culture     Status: None   Collection Time: 06/12/18 11:41 AM   Specimen: Urine, Catheterized  Result Value Ref Range Status   Specimen Description   Final    URINE, CATHETERIZED Performed at [facility], [address] [place], [place] [address]    Special Requests   Final    NONE Performed at [facility], [address] [place], [place] [address]    Culture   Final    NO GROWTH Performed at [facility], [address] [place], [place] [address]    Report Status 06/13/2018 FINAL  Final      Radiology Studies: Dg Chest Port 1 View  Result Date: 06/14/2018 CLINICAL DATA:  Short of breath for several days EXAM: PORTABLE CHEST 1 VIEW COMPARISON:  06/13/2018 FINDINGS: Small bilateral pleural effusions. Mild bilateral interstitial thickening. No pneumothorax. Stable cardiomegaly. No acute osseous abnormality. IMPRESSION: 1. Mild interstitial edema with small bilateral pleural effusions. Electronically Signed   By: Elige KoHetal  Patel   On: 06/14/2018 12:10     Scheduled Meds: . amLODipine  10 mg Oral Daily  . aspirin  81 mg Oral Daily  . enoxaparin (LOVENOX) injection  40 mg Subcutaneous Q24H  . hydrALAZINE  100 mg Oral TID  . insulin aspart  0-15 Units Subcutaneous Q6H  .  insulin aspart  0-5 Units Subcutaneous QHS  . isosorbide mononitrate  60 mg Oral Daily  . metoprolol succinate  100 mg Oral Daily  . pantoprazole  40 mg Oral Daily  . potassium chloride  40 mEq Oral Once   Continuous Infusions:    LOS: 4 days      Ephrem Carrick, [facility] If 7PM-7AM, please contact night-coverage [email or web address] Password [email or web address] 06/16/2018, 8:12 AM

## 2018-06-17 LAB — BLOOD GAS, ARTERIAL
Acid-Base Excess: 7.9 mmol/L — ABNORMAL HIGH (ref 0.0–2.0)
Bicarbonate: 31.5 mmol/L — ABNORMAL HIGH (ref 20.0–28.0)
FIO2: 28
O2 Saturation: 95 %
Patient temperature: 37
pCO2 arterial: 40.7 mmHg (ref 32.0–48.0)
pH, Arterial: 7.5 — ABNORMAL HIGH (ref 7.350–7.450)
pO2, Arterial: 69.5 mmHg — ABNORMAL LOW (ref 83.0–108.0)

## 2018-06-17 LAB — GLUCOSE, CAPILLARY
Glucose-Capillary: 147 mg/dL — ABNORMAL HIGH (ref 70–99)
Glucose-Capillary: 197 mg/dL — ABNORMAL HIGH (ref 70–99)
Glucose-Capillary: 152 mg/dL — ABNORMAL HIGH (ref 70–99)
Glucose-Capillary: 169 mg/dL — ABNORMAL HIGH (ref 70–99)

## 2018-06-17 LAB — BASIC METABOLIC PANEL
Anion gap: 12 (ref 5–15)
BUN: 23 mg/dL (ref 8–23)
CO2: 29 mmol/L (ref 22–32)
Calcium: 8.4 mg/dL — ABNORMAL LOW (ref 8.9–10.3)
Chloride: 96 mmol/L — ABNORMAL LOW (ref 98–111)
Creatinine, Ser: 0.55 mg/dL (ref 0.44–1.00)
GFR calc Af Amer: >60 mL/min (ref >60)
GFR calc non Af Amer: >60 mL/min (ref >60)
Glucose, Bld: 179 mg/dL — ABNORMAL HIGH (ref 70–99)
Potassium: 3.6 mmol/L (ref 3.5–5.1)
Sodium: 137 mmol/L (ref 135–145)

## 2018-06-17 MED ORDER — FUROSEMIDE 10 MG/ML IJ SOLN
40.0000 mg | Freq: Every day | INTRAMUSCULAR | Status: DC
  Administered 2018-06-18: 40 mg via INTRAVENOUS
  Filled 2018-06-17: qty 4

## 2018-06-17 MED ORDER — POTASSIUM CHLORIDE CRYS ER 20 MEQ PO TBCR
40.0000 meq | EXTENDED_RELEASE_TABLET | Freq: Once | ORAL | Status: AC
  Administered 2018-06-17: 40 meq via ORAL
  Filled 2018-06-17: qty 2

## 2018-06-17 MED ORDER — POLYETHYLENE GLYCOL 3350 17 G PO PACK
17.0000 g | PACK | Freq: Once | ORAL | Status: AC
  Administered 2018-06-17: 17 g via ORAL
  Filled 2018-06-17: qty 1

## 2018-06-17 MED ORDER — FUROSEMIDE 10 MG/ML IJ SOLN
40.0000 mg | Freq: Once | INTRAMUSCULAR | Status: AC
  Administered 2018-06-17: 40 mg via INTRAVENOUS
  Filled 2018-06-17: qty 4

## 2018-06-17 NOTE — Progress Notes (Signed)
Has eaten about 50% of full liquid trays for lunch and supper. Denies increased abd pain or nausea, has only c/o back pain.  Had BM today .

## 2018-06-17 NOTE — Progress Notes (Signed)
[facility] Progress Note     Subjective: Still very lethargic but answers questions and oriented X 4.  Says had Bm this AM and abdomen feeling better. Tolerated liquids still. CTA negative for PE.  Objective: Vital signs in last 24 hours: Temp:  [98.1 F (36.7 C)-98.2 F (36.8 C)] 98.2 F (36.8 C) (06/13 0827) Pulse Rate:  [87-102] 94 (06/13 0827) Resp:  [18-22] 22 (06/13 0827) BP: (141-179)/(62-85) 179/73 (06/13 0827) SpO2:  [94 %-97 %] 97 % (06/13 0827) Weight:  [85.1 kg] 85.1 kg (06/13 0500) Last BM Date: 06/11/18  Intake/Output from previous day: 06/12 0701 - 06/13 0700 In: 480 [P.O.:480] Out: 650 [Urine:650] Intake/Output this shift: No intake/output data recorded.  General appearance: alert, cooperative and no distress Resp: gets SOB with talking, Sats 94% on O2 GI: soft, mildly distended, minimally  tender epigastric region  Lab Results:  Recent Labs    06/16/18 0458  WBC 11.3*  HGB 13.6  HCT 41.0  PLT 177   BMET Recent Labs    06/16/18 0458 06/17/18 0608  NA 138 137  K 3.2* 3.6  CL 98 96*  CO2 29 29  GLUCOSE 168* 179*  BUN 22 23  CREATININE 0.51 0.55  CALCIUM 8.3* 8.4*   PT/INR No results for input(s): LABPROT, INR in the last 72 hours.  Studies/Results: Ct Angio Chest Aorta W/cm &/or Wo/cm  Result Date: 06/16/2018 CLINICAL DATA:  Patient admitted 06/12/2018 with pancreatitis and dehydration. Increasing shortness of breath. EXAM: CT ANGIOGRAPHY CHEST WITH CONTRAST TECHNIQUE: Multidetector CT imaging of the chest was performed using the standard protocol during bolus administration of intravenous contrast. Multiplanar CT image reconstructions and MIPs were obtained to evaluate the vascular anatomy. CONTRAST:  100 mL OMNIPAQUE IOHEXOL 350 MG/ML SOLN COMPARISON:  PA and lateral chest 06/12/2018. Single-view of the chest 06/14/2018 and 06/13/2018. FINDINGS: Cardiovascular: No pulmonary embolus is identified. There is cardiomegaly. No  pericardial effusion. No aneurysm. Calcific aortic and coronary atherosclerosis noted. Mediastinum/Nodes: The right lobe of the thyroid is mildly heterogeneous. Esophagus appears normal. No lymphadenopathy. Lungs/Pleura: Small bilateral pleural effusions are present, greater on the left. Associated mild compressive atelectasis is present. There is some emphysematous disease. No nodule, mass or consolidative process. No evidence of pulmonary edema. Upper Abdomen: Visualized upper abdomen demonstrates fatty infiltration of the liver. There is partial visualization of inflammatory change in fluid about the pancreas. Small cystic lesion in the spleen is noted. Musculoskeletal: No fracture or worrisome lesion. Multilevel thoracic spondylosis is noted. Review of the MIP images confirms the above findings. IMPRESSION: Negative for pulmonary embolus. Small bilateral pleural effusions, greater on the left with associated mild compressive atelectasis. Cardiomegaly. Fatty infiltration of the liver. Partial visualization of changes in the upper abdomen consistent with the patient's known pancreatitis. Aortic Atherosclerosis (ICD10-I70.0) and Emphysema (ICD10-J43.9). Calcific coronary artery disease also noted. Electronically Signed   By: Inge Rise M.D.   On: 06/16/2018 13:14    Assessment/Plan: Ms. Zackery is a 78 yo who has pancreatitis and fatty liver/ cirrhosis on imaging. Possibly pancreatitis secondary to microlithiasis.  Patient has been reluctant for Lap chole and biopsy of the liver, and also has had SOB and respiratory issues. Overall looks lethargic every time I see her.  At this time we discussed option of her being discharged and following up as an outpatient to discuss lap chole once she is stronger.  -Advance diet as tolerated -Can follow up as outpatient given overall deconditioning and respiratory issues    LOS:  5 days    Lucretia RoersLindsay C Bridges 06/17/2018

## 2018-06-17 NOTE — Progress Notes (Signed)
PROGRESS NOTE    Heather Oconnor  WUJ:[id number] DOB: 02/10/1940 DOA: 06/12/2018 PCP: Joette CatchingNyland, Leonard, MD  Assessment & Plan:   Principal Problem:   Acute pancreatitis Active Problems:   Essential hypertension   Transient cerebral ischemia   GERD   Irritable bowel syndrome   Type 2 diabetes mellitus without complication (HCC)   Elevated LFTs   Dyspnea and respiratory abnormalities  Brief Summary:- 78 y.o. female with medical history significant for GERD, IBS, hemorrhoids, hypertension, type 2 diabetes, prior TIA, prior tobacco abuse, and obesity admitted 06/12/2018 with abd , Nausea and Vomiting.Marland Kitchen.Marland Kitchen.She was found to have acute nonalcoholic pancreatitis and elevated LFTs raising concerns about gallstones   A/p  1)HFpEF/ Acute on Chronic Diastolic Heart Failure---resp status and volume overload symptoms continues to improve with IV diuresis ................  ... 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, CT chest consistent with emphysema, no significant flareup at this time of her COPD, hold off on steroids, continue bronchodilators,   3)Acute hypoxic respiratory failure--- CTA chest without pneumonia or acute PE, PTA patient was not on oxygen, hypoxia is due to combination of underlying emphysema and acute on chronic CHF flareup secondary to volume overload in the setting of aggressive IV fluids for acute pancreatitis, continue supplemental oxygen and IV diuresis, ABG from 06/17/2018 noted  4)Acute pancreatitis-unknown etiology -Patient denies any alcohol use or prior episodes; appreciate GI consultation for assistance in management -Lipid panel without any significant triglyceride  elevation - lipase has normalized -GI and surgical consult appreciated, -advance diet to full liquids c/n PRN pain medications and antiemetics GI and general surgery recommends lap chole as small gallstones/microlithiasis may be responsible for patient's pancreatitis..... ---discussed the option of lap chole and liver biopsy ,  Patient and her husband are thinking about it but she has not made a final decision as to whether she will undergo lap chole.... If she decides to have lap chole with liver biopsy...  this will be done as outpatient  5)HTN- BP is not at goal, continue amlodipine 10 mg daily, continue Toprol-XL 100 mg daily , hydralazine as been increased to 100 mg 3 times daily previously, isosorbide was increased to 60 mg,   may use IV labetalol when necessary  Every 4 hours for systolic blood pressure over 160 mmhg  6)IBS with diarrhea -Appreciate GI evaluation -Does not appear to be infectious etiology  7)Type 2 diabetes with hyperglycemia-improved -hold home medications until oral intake is more reliable -SSI qAC and qHS before meals and nightly no  GERD -PO Protonix  Prior TIA/prior central retinal vein occlusion-- -Hold statin for now -c/n home aspirin, okay to restart Plavix as patient and her husband are not sure that they want to proceed with lap chole with liver biopsy yet  DVT prophylaxis: Lovenox Code Status: Full Family Communication: Spoke with patient's husband on phone on 06/17/18-  Disposition Plan: Currently requiring IV diuresis, poor oral intake due to proctitis related abdominal pain and nausea  Consultants:   GI/general surgery  Procedures:   None  Antimicrobials:   None  Subjective:  Her nausea is much better, no emesis, patient okay with advancing her diet, no chest pains, shortness of  breath is better,  Objective: Vitals:   06/17/18 0500 06/17/18 0548 06/17/18 0711 06/17/18 0827  BP:  (!) 155/65  (!) 179/73  Pulse:  87  94  Resp:   19  (!) 22  Temp:  98.2 F (36.8 C)  98.2 F (36.8 C)  TempSrc:    Oral  SpO2:  97% 95% 97%  Weight: 85.1 kg     Height:        Intake/Output Summary (Last 24 hours) at 06/17/2018 0954 Last data filed at 06/16/2018 1700 Gross per 24 hour  Intake 360 ml  Output 650 ml  Net -290 ml   Filed Weights   06/12/18 0735 06/16/18 0500 06/17/18 0500  Weight: 84 kg 88.3 kg 85.1 kg    Examination:  Physical Exam Gen:- Awake Alert, speaking in sentences HEENT:- [place].AT, No sclera icterus Nose- [place] 2 L/min Neck-Supple Neck,   Lungs-improving air movement, faint bibasilar rales,, no wheezing CV- S1, S2 normal Abd-  +ve B.Sounds, Abd Soft, mild epigastric/periumbilical discomfort no significant tenderness,    Extremity/Skin:- 1+ edema,    Psych-affect is appropriate, oriented x3 Neuro-generalized weakness, but no new focal deficits, no tremors  CBC: Recent Labs  Lab 06/12/18 0815 06/13/18 0634 06/14/18 0544 06/16/18 0458  WBC 14.3* 12.7* 10.5 11.3*  NEUTROABS 13.1*  --   --   --   HGB 17.0* 14.2 13.1 13.6  HCT 49.5* 44.7 40.4 41.0  MCV 84.9 91.4 91.0 88.2  PLT 223 186 154 177   Basic Metabolic Panel: Recent Labs  Lab 06/13/18 0634 06/14/18 0544 06/15/18 0501 06/16/18 0458 06/17/18 0608  NA 140 139 139 138 137  K 3.7 3.6 3.9 3.2* 3.6  CL 107 101 99 98 96*  CO2 23 27 29 29 29   GLUCOSE 166* 166* 170* 168* 179*  BUN 23 16 18 22 23   CREATININE 0.73 0.61 0.55 0.51 0.55  CALCIUM 7.8* 7.9* 8.4* 8.3* 8.4*  MG 1.6* 2.1  --   --   --    GFR: Estimated Creatinine Clearance: 57 mL/min (by C-G formula based on SCr of 0.55 mg/dL). Liver Function Tests: Recent Labs  Lab 06/12/18 0815 06/13/18 0634 06/14/18 0544 06/16/18 0458  AST 153* 46* 24 24  ALT 219* 103* 62* 38  ALKPHOS 48 39 41 53  BILITOT 2.1* 1.4* 1.0 1.1  PROT 6.9 6.0* 6.0* 6.1*  ALBUMIN 4.0 3.1* 2.8* 2.5*   Recent Labs  Lab 06/12/18 0815 06/13/18 0634 06/14/18 0544  LIPASE 1,169* 164* 32   No results  for input(s): AMMONIA in the last 168 hours. Coagulation Profile: No results for input(s): INR, PROTIME in the last 168 hours. Cardiac Enzymes: Recent Labs  Lab 06/12/18 0815  TROPONINI <0.03   BNP (last 3 results) No results for input(s): PROBNP in the last 8760 hours. HbA1C: No results for input(s): HGBA1C in the last 72 hours. CBG: Recent Labs  Lab 06/16/18 0602 06/16/18 1309 06/16/18 1710 06/16/18 2222 06/17/18 0727  GLUCAP 193* 153* 185* 160* 147*   Lipid Profile: No results for input(s): CHOL, HDL, LDLCALC, TRIG, CHOLHDL, LDLDIRECT in the last 72 hours. Thyroid Function Tests: No results for input(s): TSH, T4TOTAL, FREET4, T3FREE, THYROIDAB in the last 72 hours. Anemia Panel: No results for input(s): VITAMINB12, FOLATE, FERRITIN, TIBC, IRON, RETICCTPCT in the last 72 hours. Sepsis Labs: Recent Labs  Lab 06/12/18 0843 06/12/18 1038 06/14/18 0544  LATICACIDVEN 4.0* 3.6* 1.2    Recent Results (from the past 240 hour(s))  SARS Coronavirus 2 (  CEPHEID - Performed in [facility] hospital lab), Hosp Order     Status: None   Collection Time: 06/12/18  9:19 AM   Specimen: Nasopharyngeal Swab  Result Value Ref Range Status   SARS Coronavirus 2 NEGATIVE NEGATIVE Final    Comment: (NOTE) If result is NEGATIVE SARS-CoV-2 target nucleic acids are NOT DETECTED. The SARS-CoV-2 RNA is generally detectable in upper and lower  respiratory specimens during the acute phase of infection. The lowest  concentration of SARS-CoV-2 viral copies this assay can detect is 250  copies / mL. A negative result does not preclude SARS-CoV-2 infection  and should not be used as the sole basis for treatment or other  patient management decisions.  A negative result may occur with  improper specimen collection / handling, submission of specimen other  than nasopharyngeal swab, presence of viral mutation(s) within the  areas targeted by this assay, and inadequate number of viral copies  (<250  copies / mL). A negative result must be combined with clinical  observations, patient history, and epidemiological information. If result is POSITIVE SARS-CoV-2 target nucleic acids are DETECTED. The SARS-CoV-2 RNA is generally detectable in upper and lower  respiratory specimens dur ing the acute phase of infection.  Positive  results are indicative of active infection with SARS-CoV-2.  Clinical  correlation with patient history and other diagnostic information is  necessary to determine patient infection status.  Positive results do  not rule out bacterial infection or co-infection with other viruses. If result is PRESUMPTIVE POSTIVE SARS-CoV-2 nucleic acids MAY BE PRESENT.   A presumptive positive result was obtained on the submitted specimen  and confirmed on repeat testing.  While 2019 novel coronavirus  (SARS-CoV-2) nucleic acids may be present in the submitted sample  additional confirmatory testing may be necessary for epidemiological  and / or clinical management purposes  to differentiate between  SARS-CoV-2 and other Sarbecovirus currently known to infect humans.  If clinically indicated additional testing with an alternate test  methodology [phone]([id number]) is advised. The SARS-CoV-2 RNA is generally  detectable in upper and lower respiratory sp ecimens during the acute  phase of infection. The expected result is Negative. Fact Sheet for Patients:  BoilerBrush.com.cyhttps://www.fda.gov/media/136312/download Fact Sheet for Healthcare Providers: https://pope.com/https://www.fda.gov/media/136313/download This test is not yet approved or cleared by the [place] FDA and has been authorized for detection and/or diagnosis of SARS-CoV-2 by FDA under an Emergency Use Authorization (EUA).  This EUA will remain in effect (meaning this test can be used) for the duration of the COVID-19 declaration under Section 564(b)(1) of the Act, 21 U.S.C. section 360bbb-3(b)(1), unless the authorization is terminated or revoked  sooner. Performed at [facility], [address] [place], [place] [address]   Urine culture     Status: None   Collection Time: 06/12/18 11:41 AM   Specimen: Urine, Catheterized  Result Value Ref Range Status   Specimen Description   Final    URINE, CATHETERIZED Performed at [facility], [address] [place], [place] [address]    Special Requests   Final    NONE Performed at [facility], [address] [place], [place] [address]    Culture   Final    NO GROWTH Performed at [facility], [address] [place], [place] [address]    Report Status 06/13/2018 FINAL  Final     Radiology Studies: Ct Angio Chest Aorta W/cm &/or Wo/cm  Result Date: 06/16/2018 CLINICAL DATA:  Patient admitted 06/12/2018 with pancreatitis and  dehydration. Increasing shortness of breath. EXAM: CT ANGIOGRAPHY CHEST WITH CONTRAST TECHNIQUE: Multidetector CT imaging of the chest was performed using the standard protocol during bolus administration of intravenous contrast. Multiplanar CT image reconstructions and MIPs were obtained to evaluate the vascular anatomy. CONTRAST:  100 mL OMNIPAQUE IOHEXOL 350 MG/ML SOLN COMPARISON:  PA and lateral chest 06/12/2018. Single-view of the chest 06/14/2018 and 06/13/2018. FINDINGS: Cardiovascular: No pulmonary embolus is identified. There is cardiomegaly. No pericardial effusion. No aneurysm. Calcific aortic and coronary atherosclerosis noted. Mediastinum/Nodes: The right lobe of the thyroid is mildly heterogeneous. Esophagus appears normal. No lymphadenopathy. Lungs/Pleura: Small bilateral pleural effusions are present, greater on the left. Associated mild compressive atelectasis is present. There is some emphysematous disease. No nodule, mass or consolidative process. No evidence of pulmonary edema. Upper Abdomen: Visualized upper abdomen demonstrates fatty infiltration of the liver. There is partial visualization of inflammatory change in fluid about the pancreas.  Small cystic lesion in the spleen is noted. Musculoskeletal: No fracture or worrisome lesion. Multilevel thoracic spondylosis is noted. Review of the MIP images confirms the above findings. IMPRESSION: Negative for pulmonary embolus. Small bilateral pleural effusions, greater on the left with associated mild compressive atelectasis. Cardiomegaly. Fatty infiltration of the liver. Partial visualization of changes in the upper abdomen consistent with the patient's known pancreatitis. Aortic Atherosclerosis (ICD10-I70.0) and Emphysema (ICD10-J43.9). Calcific coronary artery disease also noted. Electronically Signed   By: Inge Rise M.D.   On: 06/16/2018 13:14    Scheduled Meds:  amLODipine  10 mg Oral Daily   aspirin  81 mg Oral Daily   enoxaparin (LOVENOX) injection  40 mg Subcutaneous Q24H   hydrALAZINE  100 mg Oral TID   insulin aspart  0-15 Units Subcutaneous TID WC   insulin aspart  0-5 Units Subcutaneous QHS   isosorbide mononitrate  60 mg Oral Daily   metoprolol succinate  100 mg Oral Daily   pantoprazole  40 mg Oral Daily   umeclidinium bromide  1 puff Inhalation Daily   Continuous Infusions:   LOS: 5 days   Shuntay Everetts, [facility] If 7PM-7AM, please contact night-coverage [email or web address] Password [facility] 06/17/2018, 9:54 AM

## 2018-06-18 DIAGNOSIS — R0600 Dyspnea, unspecified: Secondary | ICD-10-CM

## 2018-06-18 DIAGNOSIS — I5033 Acute on chronic diastolic (congestive) heart failure: Secondary | ICD-10-CM | POA: Diagnosis present

## 2018-06-18 DIAGNOSIS — R0689 Other abnormalities of breathing: Secondary | ICD-10-CM

## 2018-06-18 LAB — CBC
HCT: 42.8 % (ref 36.0–46.0)
Hemoglobin: 13.6 g/dL (ref 12.0–15.0)
MCH: 28.9 pg (ref 26.0–34.0)
MCHC: 31.8 g/dL (ref 30.0–36.0)
MCV: 91.1 fL (ref 80.0–100.0)
Platelets: 239 10*3/uL (ref 150–400)
RBC: 4.7 MIL/uL (ref 3.87–5.11)
RDW: 14.7 % (ref 11.5–15.5)
WBC: 11.8 10*3/uL — ABNORMAL HIGH (ref 4.0–10.5)
nRBC: 0 % (ref 0.0–0.2)

## 2018-06-18 LAB — BASIC METABOLIC PANEL
Anion gap: 12 (ref 5–15)
BUN: 20 mg/dL (ref 8–23)
CO2: 27 mmol/L (ref 22–32)
Calcium: 8.4 mg/dL — ABNORMAL LOW (ref 8.9–10.3)
Chloride: 96 mmol/L — ABNORMAL LOW (ref 98–111)
Creatinine, Ser: 0.47 mg/dL (ref 0.44–1.00)
GFR calc Af Amer: >60 mL/min (ref >60)
GFR calc non Af Amer: >60 mL/min (ref >60)
Glucose, Bld: 218 mg/dL — ABNORMAL HIGH (ref 70–99)
Potassium: 3.5 mmol/L (ref 3.5–5.1)
Sodium: 135 mmol/L (ref 135–145)

## 2018-06-18 LAB — GLUCOSE, CAPILLARY
Glucose-Capillary: 202 mg/dL — ABNORMAL HIGH (ref 70–99)
Glucose-Capillary: 216 mg/dL — ABNORMAL HIGH (ref 70–99)

## 2018-06-18 MED ORDER — HYDRALAZINE HCL 50 MG PO TABS: 50.0000 mg | ORAL_TABLET | Freq: Three times a day (TID) | ORAL | 2 refills | Status: DC

## 2018-06-18 MED ORDER — ACETAMINOPHEN 500 MG PO TABS: 1000.0000 mg | ORAL_TABLET | Freq: Four times a day (QID) | ORAL | 0 refills | Status: DC | PRN

## 2018-06-18 MED ORDER — GLIPIZIDE ER 5 MG PO TB24: 5.0000 mg | ORAL_TABLET | Freq: Every day | ORAL | 1 refills | Status: DC

## 2018-06-18 MED ORDER — ISOSORBIDE MONONITRATE ER 30 MG PO TB24: 30.0000 mg | ORAL_TABLET | Freq: Every day | ORAL | 2 refills | Status: DC

## 2018-06-18 MED ORDER — ASPIRIN EC 81 MG PO TBEC: 81.0000 mg | DELAYED_RELEASE_TABLET | Freq: Every day | ORAL | 2 refills | Status: AC

## 2018-06-18 MED ORDER — METOPROLOL SUCCINATE ER 100 MG PO TB24: 100.0000 mg | ORAL_TABLET | Freq: Every day | ORAL | 1 refills | Status: DC

## 2018-06-18 MED ORDER — AMLODIPINE BESYLATE 10 MG PO TABS: 10.0000 mg | ORAL_TABLET | Freq: Every day | ORAL | 5 refills | Status: DC

## 2018-06-18 MED ORDER — FUROSEMIDE 40 MG PO TABS: 40.0000 mg | ORAL_TABLET | Freq: Every day | ORAL | 1 refills | Status: DC

## 2018-06-18 MED ORDER — OXYCODONE HCL 5 MG PO TABS: 5.0000 mg | ORAL_TABLET | ORAL | 0 refills | Status: DC | PRN

## 2018-06-18 MED ORDER — UMECLIDINIUM BROMIDE 62.5 MCG/INH IN AEPB: 1.0000 | INHALATION_SPRAY | Freq: Every day | RESPIRATORY_TRACT | 3 refills | Status: DC

## 2018-06-18 MED ORDER — LISINOPRIL 20 MG PO TABS: 20.0000 mg | ORAL_TABLET | Freq: Every day | ORAL | 11 refills | Status: DC

## 2018-06-18 MED ORDER — ONDANSETRON HCL 4 MG PO TABS: 4.0000 mg | ORAL_TABLET | Freq: Four times a day (QID) | ORAL | 0 refills | Status: DC | PRN

## 2018-06-18 MED ORDER — ALBUTEROL SULFATE HFA 108 (90 BASE) MCG/ACT IN AERS: 2.0000 | INHALATION_SPRAY | Freq: Four times a day (QID) | RESPIRATORY_TRACT | 2 refills | Status: DC | PRN

## 2018-06-18 MED ORDER — METFORMIN HCL 500 MG PO TABS: 500.0000 mg | ORAL_TABLET | Freq: Two times a day (BID) | ORAL | 4 refills | Status: DC

## 2018-06-18 MED ORDER — PANTOPRAZOLE SODIUM 40 MG PO TBEC: 40.0000 mg | DELAYED_RELEASE_TABLET | Freq: Every day | ORAL | 2 refills | Status: DC

## 2018-06-18 NOTE — Progress Notes (Signed)
[facility] Progress Note     Subjective: Doing better this Am. In the chair. Looks more awake and brighter.   Objective: Vital signs in last 24 hours: Temp:  [98.4 F (36.9 C)-98.7 F (37.1 C)] 98.6 F (37 C) (06/14 0503) Pulse Rate:  [86-94] 86 (06/14 0503) Resp:  [17] 17 (06/14 0503) BP: (136-144)/(66-72) 136/72 (06/14 0503) SpO2:  [93 %-97 %] 97 % (06/14 0755) Weight:  [85.3 kg] 85.3 kg (06/14 0503) Last BM Date: 06/17/18  Intake/Output from previous day: 06/13 0701 - 06/14 0700 In: 840 [P.O.:840] Out: 100 [Urine:100] Intake/Output this shift: Total I/O In: 360 [P.O.:360] Out: -   General appearance: alert, cooperative and no distress Resp: normal work of breathing GI: soft, mildly distended, nontender Extremities: extremities normal, atraumatic, no cyanosis or edema  Lab Results:  Recent Labs    06/16/18 0458 06/18/18 0703  WBC 11.3* 11.8*  HGB 13.6 13.6  HCT 41.0 42.8  PLT 177 239   BMET Recent Labs    06/17/18 0608 06/18/18 0703  NA 137 135  K 3.6 3.5  CL 96* 96*  CO2 29 27  GLUCOSE 179* 218*  BUN 23 20  CREATININE 0.55 0.47  CALCIUM 8.4* 8.4*   PT/INR No results for input(s): LABPROT, INR in the last 72 hours.  Studies/Results: Ct Angio Chest Aorta W/cm &/or Wo/cm  Result Date: 06/16/2018 CLINICAL DATA:  Patient admitted 06/12/2018 with pancreatitis and dehydration. Increasing shortness of breath. EXAM: CT ANGIOGRAPHY CHEST WITH CONTRAST TECHNIQUE: Multidetector CT imaging of the chest was performed using the standard protocol during bolus administration of intravenous contrast. Multiplanar CT image reconstructions and MIPs were obtained to evaluate the vascular anatomy. CONTRAST:  100 mL OMNIPAQUE IOHEXOL 350 MG/ML SOLN COMPARISON:  PA and lateral chest 06/12/2018. Single-view of the chest 06/14/2018 and 06/13/2018. FINDINGS: Cardiovascular: No pulmonary embolus is identified. There is cardiomegaly. No pericardial effusion. No  aneurysm. Calcific aortic and coronary atherosclerosis noted. Mediastinum/Nodes: The right lobe of the thyroid is mildly heterogeneous. Esophagus appears normal. No lymphadenopathy. Lungs/Pleura: Small bilateral pleural effusions are present, greater on the left. Associated mild compressive atelectasis is present. There is some emphysematous disease. No nodule, mass or consolidative process. No evidence of pulmonary edema. Upper Abdomen: Visualized upper abdomen demonstrates fatty infiltration of the liver. There is partial visualization of inflammatory change in fluid about the pancreas. Small cystic lesion in the spleen is noted. Musculoskeletal: No fracture or worrisome lesion. Multilevel thoracic spondylosis is noted. Review of the MIP images confirms the above findings. IMPRESSION: Negative for pulmonary embolus. Small bilateral pleural effusions, greater on the left with associated mild compressive atelectasis. Cardiomegaly. Fatty infiltration of the liver. Partial visualization of changes in the upper abdomen consistent with the patient's known pancreatitis. Aortic Atherosclerosis (ICD10-I70.0) and Emphysema (ICD10-J43.9). Calcific coronary artery disease also noted. Electronically Signed   By: Inge Rise M.D.   On: 06/16/2018 13:14    Assessment/Plan: Ms. Mcdevitt is a 78 yo who has pancreatitis and fatty liver who is presumed to have microlithiasis that has caused the pancreatitis. Patient is concerned about getting her gallbladder out, and wants to discuss as an outpatient.  -Plan for Lap chole and biopsy of the liver discussion as an outpatient in the upcoming weeks -Discussed with Dr. Denton Brick    LOS: 6 days    Virl Cagey 06/18/2018

## 2018-06-18 NOTE — Progress Notes (Signed)
SATURATION QUALIFICATIONS: (This note is used to comply with regulatory documentation for home oxygen)  Patient Saturations on Room Air at Rest =91%  Patient Saturations on Room Air while Ambulating = 89%  Patient Saturations on 2 Liters of oxygen while Ambulating = 94%  Please briefly explain why patient needs home oxygen: PT O2 SAT 94% ON 2L, DROPS TO 89% ON RA WHEN AMBULATING.

## 2018-06-18 NOTE — Discharge Summary (Signed)
Heather Oconnor, is a [age] y.o. female  DOB 02/01/1940  MRN [id number].  Admission date:  06/12/2018  Admitting Physician  Pratik Hoover Brunette Shah, DO  Discharge Date:  06/18/2018   Primary MD  Joette CatchingNyland, Leonard, MD  Recommendations for primary care physician for things to follow:   1) soft diet advised, avoid fatty/greasy food 2)Avoid ibuprofen/Advil/Aleve/Motrin/Goody Powders/Naproxen/BC powders/Meloxicam/Diclofenac/Indomethacin and other Nonsteroidal anti-inflammatory medications as these will make you more likely to bleed and can cause stomach ulcers, can also cause Kidney problems.  3) please call to make an appointment with Dr. Algis GreenhouseLindsay Bridges general surgeon--need to be seen in about 10 to 14 days from now to discuss possibly taking out your gallbladder--- should you decide to have your gallbladder taken out you will have to be off Plavix/clopidogrel for 5 days before the date of the surgery 4) use breathing treatments as ordered 5)You will get home health physical therapy to help you rehab and get stronger   Admission Diagnosis  Elevated LFTs [R94.5] Acute pancreatitis, unspecified complication status, unspecified pancreatitis type [K85.90]   Discharge Diagnosis  Elevated LFTs [R94.5] Acute pancreatitis, unspecified complication status, unspecified pancreatitis type [K85.90]   Principal Problem:   Acute pancreatitis Active Problems:   Elevated LFTs/Needs Liver Biopsy   Acute on chronic heart failure with preserved ejection fraction (HFpEF) (HCC)   Essential hypertension   Type 2 diabetes mellitus without complication (HCC)   SMOKER   Transient cerebral ischemia   GERD   Irritable bowel syndrome   Dyspnea and respiratory abnormalities      Past Medical History:  Diagnosis Date   Diabetes (HCC)    GERD (gastroesophageal reflux disease)    Hemorrhoids    Hypertension    IBS (irritable bowel  syndrome)     Past Surgical History:  Procedure Laterality Date   COLONOSCOPY  09/07/2007   RMR: anal canal/external hemorrhoids, redundant colon, left-sided diverticula, otherwise normal colonic mucosa     HPI  from the history and physical done on the day of admission:    Chief Complaint: Abdominal pain with nausea and vomiting  HPI: Heather Oconnor is a 78 y.o. female with medical history significant for GERD, IBS, hemorrhoids, hypertension, type 2 diabetes, prior TIA, prior tobacco abuse, and obesity who presented with epigastric as well as some generalized abdominal pain that began earlier yesterday.  This was associated with intermittent episodes of nausea and vomiting and some questionable diarrhea.  She denies any hematemesis or dark stools or bloody stools.  She denies any fevers or chills and has no chest pain, shortness of breath, or cough.  She denies any radiation of the pain otherwise and has had nothing to eat or drink since yesterday morning.  She denies any new medications and denies alcohol use.   ED Course: Vital signs demonstrate some sinus tachycardia as well as elevated blood pressure readings.  Laboratory data with leukocytosis of 14,300, glucose 334, lactic acid 3.4, lipase 1169, T bili 2.1, AST 153, and ALT 219.  CT of the  abdomen and pelvis was performed in the ED demonstrates signs of acute pancreatitis as well as some underlying liver cirrhosis.  Gallbladder is distended, but repeat imaging with right upper quadrant ultrasound with no biliary obstruction or other findings otherwise noted.  No signs of necrosis on pancreas noted.  She has been given 2 L IV fluid bolus and remains on 100 cc/h of normal saline.  She has been given multiple doses of morphine with minimal relief noted as well as some Zofran.  She states that her mouth is dry and she would like something to drink.   Hospital Course:    Brief Summary:- 78 y.o.femalewith medical history significant  forGERD, IBS, hemorrhoids, hypertension, type 2 diabetes, prior TIA, prior tobacco abuse, and obesity admitted 06/12/2018 with abd , Nausea and Vomiting.Marland Kitchen.Marland Kitchen.She was found to have acute nonalcoholic pancreatitis and elevated LFTs raising concerns about gallstones   A/p 1)HFpEF/ Acute on Chronic Diastolic Heart Failure---resp status and volume overload symptoms----overall much improved with IV diuresis ................  ... patient with history of chronic diastolic dysfunction CHF, now patient had volume overload in the setting of IV fluids for pancreatitis, echo this admission with EF 55 to 60%, hypoxia has resolved, no longer quiring oxygen, fluid balance is negative,  weight is down to 85 kg from to 88.3 kg,   okay to discharge home on p.o. 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  able to wean off oxygen at this time, at rest and with ambulation O2 sats between 89 and 91% on room air. ABG from 06/17/2018 noted  4)Acute pancreatitis-unknown etiology -Patient denies any alcohol use or prior episodes; appreciate GI consultation for assistance in management -Lipid panel without any significant triglyceride elevation - lipase has normalized -GI and surgical consult appreciated, -advance diet to full liquids c/n PRN pain medications and antiemetics GI and general surgery recommends lap chole as small gallstones/microlithiasis may be responsible for patient's pancreatitis..... ---discussed the option of lap chole and liver biopsy ,  Patient and  her husband are thinking about it but she has not made a final decision as to whether she will undergo lap chole.... If she decides to have lap chole with liver biopsy...  this will be done as outpatient.... If patient decides to have lap chole and liver biopsy she will have to hold Plavix for 5 days  5)HTN- BP  has improved, continue amlodipine 10 mg daily, continue Toprol-XL , hydralazine and isosorbide   6)IBS with diarrhea -Appreciate GI evaluation -Does not appear to be infectious etiology  7)Type 2 diabetes with hyperglycemia-improved Okay to resume home Rx  GERD -PO Protonix  Prior TIA/prior central retinal vein occlusion-- -resume statin for now -Resume home aspirin, okay to restart Plavix as patient and her husband are not sure that they want to proceed with lap chole with liver biopsy yet--- . If patient decides to have lap chole and liver biopsy she will have to hold Plavix for 5 days   DVT prophylaxis: Lovenox Code Status: Full Family Communication: Spoke with patient's husband on phone on 06/18/18-....Marland Kitchen.Marland Kitchen. he is agreeable with  discharge home with home health services  Disposition Plan:  Home with home health services  Consultants:   GI/general surgery  Procedures:   None  Antimicrobials:   None Discharge Condition: Table, hypoxia has resolved  Follow UP--- PCP and general surgeon as outlined above  Diet and Activity recommendation:  As advised  Discharge Instructions    Discharge Instructions    Call MD for:  difficulty breathing, headache or visual disturbances   Complete by: As directed    Call MD for:  persistant dizziness or light-headedness   Complete by: As directed    Call MD for:  severe uncontrolled pain   Complete by: As directed    Call MD for:  temperature >100.4   Complete by: As directed    Diet - low sodium heart healthy   Complete by: As directed    Diet Carb Modified   Complete by: As directed    Discharge instructions    Complete by: As directed    1) soft diet advised, avoid fatty/greasy food 2)Avoid ibuprofen/Advil/Aleve/Motrin/Goody Powders/Naproxen/BC powders/Meloxicam/Diclofenac/Indomethacin and other Nonsteroidal anti-inflammatory medications as these will make you more likely to bleed and can cause stomach ulcers, can also cause Kidney problems.  3) please call to make an appointment with Dr. Algis Greenhouse general surgeon--need to be seen in about 10 to 14 days from now to discuss possibly taking out your gallbladder--- should you decide to have your gallbladder taken out you will have to be off Plavix/clopidogrel for 5 days before the date of the surgery 4) use oxygen and breathing treatments as ordered 5)You will get home health physical therapy to help you rehab and get stronger   Face-to-face encounter (required for Medicare/Medicaid patients)   Complete by: As directed    I Tedi Hughson certify that this patient is under my care and that I, or a nurse practitioner or physician's assistant working with me, had a face-to-face encounter that meets the physician face-to-face encounter requirements with this patient on 06/18/2018. The encounter with the patient was in whole, or in part for the following medical condition(s) which is the primary reason for home health care (List medical condition):   CHF/pancreatitis with generalized weakness and debility   The encounter with the patient was in whole, or in part, for the following medical condition, which is the primary reason for home health care: Generalized weakness/Debility---- CHF and Pancreatitis   I certify that, based on my findings, the following services are medically necessary home health services: Physical therapy   Reason for Medically Necessary Home Health Services: Therapy- Therapeutic Exercises to Increase Strength and Endurance   My clinical findings support the need for the above services: Shortness of breath with activity   Further, I  certify that my clinical findings support that this patient is homebound due to: Shortness of Breath with activity   Home Health   Complete by: As directed    Generalized weakness/Debility----CHF and pancreatitis   To provide the following care/treatments: PT   Increase activity slowly   Complete by: As directed         Discharge Medications     Allergies as of 06/18/2018   No Known Allergies     Medication List    STOP taking these medications   aspirin 81 MG chewable tablet Replaced by: aspirin EC 81 MG tablet   lisinopril-hydrochlorothiazide 20-12.5 MG tablet Commonly known as: ZESTORETIC     TAKE these medications   acetaminophen 500 MG tablet Commonly known as:  TYLENOL Take 2 tablets (1,000 mg total) by mouth every 6 (six) hours as needed.   albuterol 108 (90 Base) MCG/ACT inhaler Commonly known as: VENTOLIN HFA Inhale 2 puffs into the lungs every 6 (six) hours as needed for wheezing or shortness of breath.   amLODipine 10 MG tablet Commonly known as: NORVASC Take 1 tablet (10 mg total) by mouth daily.   aspirin EC 81 MG tablet Take 1 tablet (81 mg total) by mouth daily with breakfast. Replaces: aspirin 81 MG chewable tablet   clopidogrel 75 MG tablet Commonly known as: PLAVIX Take 75 mg by mouth daily.   DORZOLAMIDE HCL-TIMOLOL MAL OP Apply 1 drop to eye daily.   furosemide 40 MG tablet Commonly known as: Lasix Take 1 tablet (40 mg total) by mouth daily.   glipiZIDE 5 MG 24 hr tablet Commonly known as: GLUCOTROL XL Take 1 tablet (5 mg total) by mouth daily. Start taking on: June 20, 2018 What changed: These instructions start on June 20, 2018. If you are unsure what to do until then, ask your doctor or other care provider.   hydrALAZINE 50 MG tablet Commonly known as: APRESOLINE Take 1 tablet (50 mg total) by mouth 3 (three) times daily.   isosorbide mononitrate 30 MG 24 hr tablet Commonly known as: IMDUR Take 1 tablet (30 mg total) by mouth  daily. Start taking on: June 19, 2018   lisinopril 20 MG tablet Commonly known as: ZESTRIL Take 1 tablet (20 mg total) by mouth daily.   loperamide 2 MG capsule Commonly known as: IMODIUM Take 2 mg by mouth daily. Will take an additional if needed   metFORMIN 500 MG tablet Commonly known as: GLUCOPHAGE Take 1 tablet (500 mg total) by mouth 2 (two) times daily with a meal. What changed:   how much to take  when to take this   metoprolol succinate 100 MG 24 hr tablet Commonly known as: TOPROL-XL Take 1 tablet (100 mg total) by mouth daily.   ondansetron 4 MG tablet Commonly known as: ZOFRAN Take 1 tablet (4 mg total) by mouth every 6 (six) hours as needed for nausea.   oxyCODONE 5 MG immediate release tablet Commonly known as: Oxy IR/ROXICODONE Take 1 tablet (5 mg total) by mouth every 4 (four) hours as needed for severe pain.   pantoprazole 40 MG tablet Commonly known as: PROTONIX Take 1 tablet (40 mg total) by mouth daily. Start taking on: June 19, 2018   umeclidinium bromide 62.5 MCG/INH Aepb Commonly known as: INCRUSE ELLIPTA Inhale 1 puff into the lungs daily. Dx- J44.1 Start taking on: June 19, 2018   Xalatan 0.005 % ophthalmic solution Generic drug: latanoprost Place 1 drop into the right eye at bedtime.       Major procedures and Radiology Reports - PLEASE review detailed and final reports for all details, in brief -   Dg Chest 2 View  Result Date: 06/12/2018 CLINICAL DATA:  Abdominal pain EXAM: CHEST - 2 VIEW COMPARISON:  None FINDINGS: Upper normal heart size. Mediastinal contours and pulmonary vascularity normal. Minimal bibasilar atelectasis. Lungs otherwise clear. No pleural effusion or pneumothorax. Atherosclerotic calcification aorta. Bones demineralized. IMPRESSION: Bibasilar atelectasis. Electronically Signed   By: Ulyses Southward M.D.   On: 06/12/2018 08:38   Ct Abdomen Pelvis W Contrast  Result Date: 06/12/2018 CLINICAL DATA:  Upper abdominal pain  EXAM: CT ABDOMEN AND PELVIS WITH CONTRAST TECHNIQUE: Multidetector CT imaging of the abdomen and pelvis was performed using the standard protocol following  bolus administration of intravenous contrast. CONTRAST:  OMNIPAQUE 300 COMPARISON:  Ultrasound from earlier in the same day. FINDINGS: Lower chest: Bibasilar atelectasis right greater than left. Hepatobiliary: Decreased attenuation throughout the liver consistent with cirrhosis with mild associated ascites. The gallbladder is well distended. Pancreas: Pancreas is well visualized and demonstrates some cystic changes as well as peripancreatic inflammatory changes consistent with pancreatitis. Spleen: Spleen shows a small hypodensity likely representing hemangioma. Adrenals/Urinary Tract: The adrenal glands are within normal limits. Normal excretion of contrast is noted from the kidneys bilaterally. No renal calculi or obstructive changes are seen. The bladder is partially distended. Stomach/Bowel: Diverticulosis without evidence of diverticulitis. No obstructive or inflammatory changes of the large or small bowel are seen. The appendix is within normal limits. Vascular/Lymphatic: Heavy atherosclerotic calcifications are noted without aneurysmal dilatation. No lymphadenopathy is seen. Reproductive: Uterus and bilateral adnexa are unremarkable. Other: No abdominal wall hernia or abnormality. No abdominopelvic ascites. Musculoskeletal: Degenerative changes of lumbar spine are noted. IMPRESSION: Changes consistent with pancreatitis and peripancreatic inflammatory change. Decreased attenuation of the liver consistent with underlying cirrhosis. Mild ascites is noted which may be related to the cirrhosis. Bibasilar atelectasis right greater than left. Chronic changes as described above. Electronically Signed   By: Alcide Clever M.D.   On: 06/12/2018 12:41   Dg Chest Port 1 View  Result Date: 06/14/2018 CLINICAL DATA:  Short of breath for several days EXAM:  PORTABLE CHEST 1 VIEW COMPARISON:  06/13/2018 FINDINGS: Small bilateral pleural effusions. Mild bilateral interstitial thickening. No pneumothorax. Stable cardiomegaly. No acute osseous abnormality. IMPRESSION: 1. Mild interstitial edema with small bilateral pleural effusions. Electronically Signed   By: Elige Ko   On: 06/14/2018 12:10   Dg Chest Port 1 View  Result Date: 06/13/2018 CLINICAL DATA:  78 year old female with shortness of breath. EXAM: PORTABLE CHEST 1 VIEW COMPARISON:  06/12/2018 chest radiographs. CT Abdomen and Pelvis yesterday. FINDINGS: Portable AP upright view at 1950 hours. Dense new left lung base opacity obscuring the hemidiaphragm. Patchy right lung base opacity appearing similar to the CT yesterday. Increased pulmonary vascularity from the radiographs yesterday. Stable mild cardiomegaly. Other mediastinal contours are within normal limits. Visualized tracheal air column is within normal limits. No pneumothorax. Paucity of bowel gas in the upper abdomen. IMPRESSION: 1. New pulmonary interstitial edema and small left pleural effusion suspected. 2. Mild chronic cardiomegaly. 3. Patchy right lung base opacity appears similar to the atelectasis on the CT yesterday. Electronically Signed   By: Odessa Fleming M.D.   On: 06/13/2018 20:07   Ct Angio Chest Aorta W/cm &/or Wo/cm  Result Date: 06/16/2018 CLINICAL DATA:  Patient admitted 06/12/2018 with pancreatitis and dehydration. Increasing shortness of breath. EXAM: CT ANGIOGRAPHY CHEST WITH CONTRAST TECHNIQUE: Multidetector CT imaging of the chest was performed using the standard protocol during bolus administration of intravenous contrast. Multiplanar CT image reconstructions and MIPs were obtained to evaluate the vascular anatomy. CONTRAST:  100 mL OMNIPAQUE IOHEXOL 350 MG/ML SOLN COMPARISON:  PA and lateral chest 06/12/2018. Single-view of the chest 06/14/2018 and 06/13/2018. FINDINGS: Cardiovascular: No pulmonary embolus is identified. There  is cardiomegaly. No pericardial effusion. No aneurysm. Calcific aortic and coronary atherosclerosis noted. Mediastinum/Nodes: The right lobe of the thyroid is mildly heterogeneous. Esophagus appears normal. No lymphadenopathy. Lungs/Pleura: Small bilateral pleural effusions are present, greater on the left. Associated mild compressive atelectasis is present. There is some emphysematous disease. No nodule, mass or consolidative process. No evidence of pulmonary edema. Upper Abdomen: Visualized upper abdomen demonstrates fatty  infiltration of the liver. There is partial visualization of inflammatory change in fluid about the pancreas. Small cystic lesion in the spleen is noted. Musculoskeletal: No fracture or worrisome lesion. Multilevel thoracic spondylosis is noted. Review of the MIP images confirms the above findings. IMPRESSION: Negative for pulmonary embolus. Small bilateral pleural effusions, greater on the left with associated mild compressive atelectasis. Cardiomegaly. Fatty infiltration of the liver. Partial visualization of changes in the upper abdomen consistent with the patient's known pancreatitis. Aortic Atherosclerosis (ICD10-I70.0) and Emphysema (ICD10-J43.9). Calcific coronary artery disease also noted. Electronically Signed   By: Drusilla Kannerhomas  Dalessio M.D.   On: 06/16/2018 13:14   [place] Abdomen Limited Ruq  Result Date: 06/12/2018 CLINICAL DATA:  Elevated LFTs.  Personal history of cirrhosis. EXAM: ULTRASOUND ABDOMEN LIMITED RIGHT UPPER QUADRANT COMPARISON:  None. FINDINGS: Gallbladder: No gallstones or wall thickening visualized. No sonographic Murphy sign noted by sonographer. Common bile duct: Diameter: 6.3 mm, upper limits of normal Liver: The liver is diffusely echogenic. Internal architecture is lost. No discrete lesions are present. Sensitivity is decreased due to hyperechoic parenchyma and body habitus. A small amount of free fluid is evident. Portal vein is patent on color Doppler imaging with  normal direction of blood flow towards the liver. IMPRESSION: 1. Normal sonographic appearance of the gallbladder. 2. The liver is diffusely echogenic. Findings are consistent with the given diagnosis of cirrhosis. No discrete lesion is present. 3. Ascites. Electronically Signed   By: Marin Robertshristopher  Mattern M.D.   On: 06/12/2018 11:06    Micro Results    Recent Results (from the past 240 hour(s))  SARS Coronavirus 2 (CEPHEID - Performed in [facility] lab), Hosp Order     Status: None   Collection Time: 06/12/18  9:19 AM   Specimen: Nasopharyngeal Swab  Result Value Ref Range Status   SARS Coronavirus 2 NEGATIVE NEGATIVE Final    Comment: (NOTE) If result is NEGATIVE SARS-CoV-2 target nucleic acids are NOT DETECTED. The SARS-CoV-2 RNA is generally detectable in upper and lower  respiratory specimens during the acute phase of infection. The lowest  concentration of SARS-CoV-2 viral copies this assay can detect is 250  copies / mL. A negative result does not preclude SARS-CoV-2 infection  and should not be used as the sole basis for treatment or other  patient management decisions.  A negative result may occur with  improper specimen collection / handling, submission of specimen other  than nasopharyngeal swab, presence of viral mutation(s) within the  areas targeted by this assay, and inadequate number of viral copies  (<250 copies / mL). A negative result must be combined with clinical  observations, patient history, and epidemiological information. If result is POSITIVE SARS-CoV-2 target nucleic acids are DETECTED. The SARS-CoV-2 RNA is generally detectable in upper and lower  respiratory specimens dur ing the acute phase of infection.  Positive  results are indicative of active infection with SARS-CoV-2.  Clinical  correlation with patient history and other diagnostic information is  necessary to determine patient infection status.  Positive results do  not rule out  bacterial infection or co-infection with other viruses. If result is PRESUMPTIVE POSTIVE SARS-CoV-2 nucleic acids MAY BE PRESENT.   A presumptive positive result was obtained on the submitted specimen  and confirmed on repeat testing.  While 2019 novel coronavirus  (SARS-CoV-2) nucleic acids may be present in the submitted sample  additional confirmatory testing may be necessary for epidemiological  and / or clinical management purposes  to differentiate between  SARS-CoV-2 and other Sarbecovirus currently known to infect humans.  If clinically indicated additional testing with an alternate test  methodology [phone]) is advised. The SARS-CoV-2 RNA is generally  detectable in upper and lower respiratory sp ecimens during the acute  phase of infection. The expected result is Negative. Fact Sheet for Patients:  StrictlyIdeas.no Fact Sheet for Healthcare Providers: BankingDealers.co.za This test is not yet approved or cleared by the [place] FDA and has been authorized for detection and/or diagnosis of SARS-CoV-2 by FDA under an Emergency Use Authorization (EUA).  This EUA will remain in effect (meaning this test can be used) for the duration of the COVID-19 declaration under Section 564(b)(1) of the Act, 21 U.S.C. section 360bbb-3(b)(1), unless the authorization is terminated or revoked sooner. Performed at [facility], [address] [place], [place] [address]   Urine culture     Status: None   Collection Time: 06/12/18 11:41 AM   Specimen: Urine, Catheterized  Result Value Ref Range Status   Specimen Description   Final    URINE, CATHETERIZED Performed at [facility], [address], [place], [place] [address]    Special Requests   Final    NONE Performed at [facility], [address], [place], [place] [address]    Culture   Final    NO GROWTH Performed at [facility], [place] [address], [place], [place] [address]     Report Status 06/13/2018 FINAL  Final       Today   Subjective    Heather Oconnor today has no new complaints, eating and drinking okay, had a BM, O2 sats at rest and post ambulation 89 to 91% on room air,  Chest pains no palpitations no dizziness, patient is eager to go home      Patient has been seen and examined prior to discharge   Objective   Blood pressure 136/72, pulse 86, temperature 98.6 F (37 C), temperature source Oral, resp. rate 17, height 5' (1.524 m), weight 85.3 kg, SpO2 97 %.   Intake/Output Summary (Last 24 hours) at 06/18/2018 1119 Last data filed at 06/18/2018 0900 Gross per 24 hour  Intake 1200 ml  Output 100 ml  Net 1100 ml    Exam Gen:- Awake Alert, no acute distress, speaking in complete sentences  HEENT:- .AT, No sclera icterus Neck-Supple Neck,No JVD,.  Lungs-improved air movement bilaterally, no wheezing or rales  CV- S1, S2 normal, regular Abd-  +ve B.Sounds, Abd Soft, No tenderness,    Extremity/Skin:-Trace edema,   good pulses Psych-affect is appropriate, oriented x3 Neuro-generalized weakness, but no new focal deficits, no tremors   Data Review   CBC w Diff:  Lab Results  Component Value Date   WBC 11.8 (H) 06/18/2018   HGB 13.6 06/18/2018   HCT 42.8 06/18/2018   PLT 239 06/18/2018   LYMPHOPCT 4 06/12/2018   MONOPCT 4 06/12/2018   EOSPCT 0 06/12/2018   BASOPCT 0 06/12/2018    CMP:  Lab Results  Component Value Date   NA 135 06/18/2018   K 3.5 06/18/2018   CL 96 (L) 06/18/2018   CO2 27 06/18/2018   BUN 20 06/18/2018   CREATININE 0.47 06/18/2018   PROT 6.1 (L) 06/16/2018   ALBUMIN 2.5 (L) 06/16/2018   BILITOT 1.1 06/16/2018   ALKPHOS 53 06/16/2018   AST 24 06/16/2018   ALT 38 06/16/2018  .   Total Discharge time is about 33 minutes  Roxan Hockey M.D on 06/18/2018 at 11:19  AM  Go to [email or web address] -  for contact info  [facility] - Office  [phone]

## 2018-06-20 ENCOUNTER — Telehealth: Payer: Self-pay | Admitting: General Surgery

## 2018-06-20 MED ORDER — OXYCODONE HCL 5 MG PO TABS: 5.0000 mg | ORAL_TABLET | ORAL | 0 refills | Status: DC | PRN

## 2018-06-20 NOTE — Progress Notes (Signed)
  Spoke with pt's husband ..he has picked up Rx including oxycodone from Pharmacy and pt is doing welll  Roxan Hockey, MD

## 2018-06-20 NOTE — Telephone Encounter (Signed)
[facility]  Patient Roxicodone Rx was printed on Sunday. This was not sent home with the patient. Dr. Denton Brick was notified today, and patient's husband needing Rx.  Dr. Denton Brick cannot prescribe after patient discharged due to [place].  I will fill this Roxicodone Rx once at this time and will plan to see patient in clinic to discuss cholecystectomy.  Discussed with Dr. Denton Brick. He will notify the husband.  Curlene Labrum, MD [facility] [address] [place], [place] [address] [phone] (office)

## 2018-06-20 NOTE — Progress Notes (Signed)
Patient's spouse contacted Dr. Rafael Bihari and advised that PT had not been to see patient since her discharge on Sunday.  Review of chart indicated and no HH agency was ordered. LCSW contacted patient's spouse, Mr. Montalvo, and discussed options for [facility] agencies. Patient agreeable to [facility].  Referral made with Romualdo Bolk with [facility].      Theron Cumbie, Clydene Pugh, LCSW

## 2018-06-29 ENCOUNTER — Other Ambulatory Visit: Payer: Self-pay

## 2018-06-29 ENCOUNTER — Telehealth (INDEPENDENT_AMBULATORY_CARE_PROVIDER_SITE_OTHER): Payer: Medicare HMO | Admitting: General Surgery

## 2018-06-29 ENCOUNTER — Telehealth: Payer: Self-pay | Admitting: Internal Medicine

## 2018-06-29 ENCOUNTER — Ambulatory Visit: Payer: Self-pay | Admitting: General Surgery

## 2018-06-29 DIAGNOSIS — R197 Diarrhea, unspecified: Secondary | ICD-10-CM | POA: Diagnosis not present

## 2018-06-29 DIAGNOSIS — R638 Other symptoms and signs concerning food and fluid intake: Secondary | ICD-10-CM | POA: Diagnosis not present

## 2018-06-29 DIAGNOSIS — IMO0001 Reserved for inherently not codable concepts without codable children: Secondary | ICD-10-CM | POA: Insufficient documentation

## 2018-06-29 DIAGNOSIS — K859 Acute pancreatitis without necrosis or infection, unspecified: Secondary | ICD-10-CM

## 2018-06-29 NOTE — Telephone Encounter (Signed)
Pt called asking to speak with RMR nurse. In hospital last week and still has diarrhea and is feeling weak. Please call [phone]

## 2018-06-29 NOTE — Telephone Encounter (Signed)
Pt called with c/o feeling some waves of nausea, not being able to eat, dry mouth, diarrhea x 1-2 times. Pt is drinking juice and water due to her dry mouth, pt will only eat 2-3 bites of her meal .Pt was hospitalized at the beginning of June for pancreatitis. Pt's urine is dark yellow. Pt was prescribed Zofran but she isn't sure what medication she is taking since her husband gives her the pills. Pt wants to eat but feels like she wants to vomit when she eats. Pt has a bad taste in her mouth. Pt was advised to go to the ED if she begins to vomit or feel worse. Pt is also aware that an ED evaluation may be best for her at this point.

## 2018-06-29 NOTE — Progress Notes (Signed)
[facility]  I connected with  Heather Oconnor on [birth year] by a phone application and verified that I am speaking with the correct person using two identifiers.   I discussed the limitations of evaluation and management by telemedicine. The patient expressed understanding and agreed to proceed.  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  She did not want to have her gallbladder out and was very confused during her stay. We dicussed the risk of surgery and the risk of recurrent pancreatitis. Ultimately she was sent home and told to follow up with me for further discussion.   I did end up and prescribe her the Roxicodone for her discharge as Dr. Denton Brick had printed a prescription that was not given to the patient at [facility]. I sent him a prescription electronically since I would be seeing the patient after discharge.   The patient called the office complaining of diarrhea.  On the phone, she says she cannot eat and her taste is gone. She has no appetite. She has not used any pain medication, but has used some tylenol.  She also says she had some diarrhea but that this has gotten better today.  She is urinating well but says her mouth is just very dry. She says she drinks water but it does not taste good.   I told her I do not know of anything specific that will cause her to lose her taste or cause a bad taste, but some of the medications possibly could.  She denies any abdominal pain or vomiting. Having some minor nausea.  Says ensures/ protein shakes do not taste good either.  She says sunny d " kills her thirst."   She says Dr. Edrick Oh is leaving at the end of June.  She has not gotten another PCP. She has paper work to see people at [employer].  I advised her to talk to Dr. Edrick Oh about the medications and her taste change.   I am suppose to see her next week to discuss the option of cholecystectomy. We  have talked about how she still sounds very tired. She had been recommended to go to a SNF but refused. She says she does not recall. Encouraged her to drink as much as possible.   Discussed risk of dehydration and kidney injury. Discussed reasons for going back to the hospital if she cannot eat and drink to get labs checked. She has physical therapy working with her tomorrow.  I spent 15 minutes talking to the patient over the phone.  Curlene Labrum, MD [facility] [address] [place], [place] [address] [phone] (office)

## 2018-06-30 NOTE — Telephone Encounter (Signed)
At a minimum, would offer an early interval follow-up here in the office in the next 1 to 2 weeks with extender.  Otherwise, agree with advice given.

## 2018-06-30 NOTE — Telephone Encounter (Signed)
Noted. Spoke with pt she didn't want to schedule an appointment today. She wants to try to eat to get a little energy. Pt is aware that if she continues to feel weak, start vomiting uncontrollably ect she should head to the ED. Pt advised to call back to schedule an appointment.

## 2018-07-04 ENCOUNTER — Encounter (HOSPITAL_COMMUNITY): Payer: Self-pay | Admitting: Emergency Medicine

## 2018-07-04 ENCOUNTER — Other Ambulatory Visit: Payer: Self-pay

## 2018-07-04 ENCOUNTER — Inpatient Hospital Stay (HOSPITAL_COMMUNITY)
Admission: EM | Admit: 2018-07-04 | Discharge: 2018-07-14 | DRG: 438 | Disposition: A | Discharge status: Home Health | Payer: Medicare HMO | Attending: Family Medicine | Admitting: Family Medicine

## 2018-07-04 ENCOUNTER — Emergency Department (HOSPITAL_COMMUNITY): Payer: Medicare HMO

## 2018-07-04 DIAGNOSIS — Y929 Unspecified place or not applicable: Secondary | ICD-10-CM

## 2018-07-04 DIAGNOSIS — I10 Essential (primary) hypertension: Secondary | ICD-10-CM | POA: Diagnosis present

## 2018-07-04 DIAGNOSIS — I110 Hypertensive heart disease with heart failure: Secondary | ICD-10-CM | POA: Diagnosis present

## 2018-07-04 DIAGNOSIS — E669 Obesity, unspecified: Secondary | ICD-10-CM | POA: Diagnosis present

## 2018-07-04 DIAGNOSIS — R531 Weakness: Secondary | ICD-10-CM | POA: Diagnosis not present

## 2018-07-04 DIAGNOSIS — K580 Irritable bowel syndrome with diarrhea: Secondary | ICD-10-CM | POA: Diagnosis present

## 2018-07-04 DIAGNOSIS — R945 Abnormal results of liver function studies: Secondary | ICD-10-CM

## 2018-07-04 DIAGNOSIS — K7460 Unspecified cirrhosis of liver: Secondary | ICD-10-CM | POA: Diagnosis present

## 2018-07-04 DIAGNOSIS — Z6830 Body mass index (BMI) 30.0-30.9, adult: Secondary | ICD-10-CM

## 2018-07-04 DIAGNOSIS — I5032 Chronic diastolic (congestive) heart failure: Secondary | ICD-10-CM | POA: Diagnosis present

## 2018-07-04 DIAGNOSIS — T502X5A Adverse effect of carbonic-anhydrase inhibitors, benzothiadiazides and other diuretics, initial encounter: Secondary | ICD-10-CM | POA: Diagnosis present

## 2018-07-04 DIAGNOSIS — Z7982 Long term (current) use of aspirin: Secondary | ICD-10-CM

## 2018-07-04 DIAGNOSIS — J449 Chronic obstructive pulmonary disease, unspecified: Secondary | ICD-10-CM | POA: Diagnosis present

## 2018-07-04 DIAGNOSIS — R1110 Vomiting, unspecified: Secondary | ICD-10-CM | POA: Diagnosis present

## 2018-07-04 DIAGNOSIS — K863 Pseudocyst of pancreas: Secondary | ICD-10-CM

## 2018-07-04 DIAGNOSIS — K644 Residual hemorrhoidal skin tags: Secondary | ICD-10-CM | POA: Diagnosis present

## 2018-07-04 DIAGNOSIS — I700 Atherosclerosis of aorta: Secondary | ICD-10-CM | POA: Diagnosis present

## 2018-07-04 DIAGNOSIS — Z7984 Long term (current) use of oral hypoglycemic drugs: Secondary | ICD-10-CM

## 2018-07-04 DIAGNOSIS — E876 Hypokalemia: Secondary | ICD-10-CM | POA: Diagnosis not present

## 2018-07-04 DIAGNOSIS — Z79899 Other long term (current) drug therapy: Secondary | ICD-10-CM

## 2018-07-04 DIAGNOSIS — R7989 Other specified abnormal findings of blood chemistry: Secondary | ICD-10-CM | POA: Diagnosis present

## 2018-07-04 DIAGNOSIS — K219 Gastro-esophageal reflux disease without esophagitis: Secondary | ICD-10-CM | POA: Diagnosis present

## 2018-07-04 DIAGNOSIS — E871 Hypo-osmolality and hyponatremia: Secondary | ICD-10-CM | POA: Diagnosis present

## 2018-07-04 DIAGNOSIS — R509 Fever, unspecified: Secondary | ICD-10-CM

## 2018-07-04 DIAGNOSIS — Z87891 Personal history of nicotine dependence: Secondary | ICD-10-CM

## 2018-07-04 DIAGNOSIS — J439 Emphysema, unspecified: Secondary | ICD-10-CM | POA: Diagnosis present

## 2018-07-04 DIAGNOSIS — E119 Type 2 diabetes mellitus without complications: Secondary | ICD-10-CM | POA: Diagnosis present

## 2018-07-04 DIAGNOSIS — Z7902 Long term (current) use of antithrombotics/antiplatelets: Secondary | ICD-10-CM

## 2018-07-04 DIAGNOSIS — E538 Deficiency of other specified B group vitamins: Secondary | ICD-10-CM | POA: Diagnosis present

## 2018-07-04 DIAGNOSIS — R109 Unspecified abdominal pain: Secondary | ICD-10-CM | POA: Diagnosis present

## 2018-07-04 DIAGNOSIS — Z20828 Contact with and (suspected) exposure to other viral communicable diseases: Secondary | ICD-10-CM | POA: Diagnosis present

## 2018-07-04 DIAGNOSIS — D649 Anemia, unspecified: Secondary | ICD-10-CM | POA: Diagnosis present

## 2018-07-04 DIAGNOSIS — E43 Unspecified severe protein-calorie malnutrition: Secondary | ICD-10-CM | POA: Diagnosis present

## 2018-07-04 DIAGNOSIS — E1165 Type 2 diabetes mellitus with hyperglycemia: Secondary | ICD-10-CM

## 2018-07-04 DIAGNOSIS — Z79891 Long term (current) use of opiate analgesic: Secondary | ICD-10-CM

## 2018-07-04 DIAGNOSIS — Z7951 Long term (current) use of inhaled steroids: Secondary | ICD-10-CM

## 2018-07-04 DIAGNOSIS — K760 Fatty (change of) liver, not elsewhere classified: Secondary | ICD-10-CM | POA: Diagnosis present

## 2018-07-04 DIAGNOSIS — Z4659 Encounter for fitting and adjustment of other gastrointestinal appliance and device: Secondary | ICD-10-CM

## 2018-07-04 DIAGNOSIS — J9601 Acute respiratory failure with hypoxia: Secondary | ICD-10-CM | POA: Diagnosis present

## 2018-07-04 DIAGNOSIS — R1310 Dysphagia, unspecified: Secondary | ICD-10-CM | POA: Diagnosis present

## 2018-07-04 DIAGNOSIS — R0902 Hypoxemia: Secondary | ICD-10-CM | POA: Diagnosis present

## 2018-07-04 HISTORY — DX: Chronic obstructive pulmonary disease, unspecified: J44.9

## 2018-07-04 HISTORY — DX: Cholecystitis, unspecified: K81.9

## 2018-07-04 HISTORY — DX: Unspecified cirrhosis of liver: K74.60

## 2018-07-04 HISTORY — DX: Acute pancreatitis without necrosis or infection, unspecified: K85.90

## 2018-07-04 LAB — COMPREHENSIVE METABOLIC PANEL
ALT: 45 U/L — ABNORMAL HIGH (ref 0–44)
AST: 60 U/L — ABNORMAL HIGH (ref 15–41)
Albumin: 2.1 g/dL — ABNORMAL LOW (ref 3.5–5.0)
Alkaline Phosphatase: 54 U/L (ref 38–126)
Anion gap: 14 (ref 5–15)
BUN: 6 mg/dL — ABNORMAL LOW (ref 8–23)
CO2: 32 mmol/L (ref 22–32)
Calcium: 8.2 mg/dL — ABNORMAL LOW (ref 8.9–10.3)
Chloride: 87 mmol/L — ABNORMAL LOW (ref 98–111)
Creatinine, Ser: 0.65 mg/dL (ref 0.44–1.00)
GFR calc Af Amer: >60 mL/min (ref >60)
GFR calc non Af Amer: >60 mL/min (ref >60)
Glucose, Bld: 89 mg/dL (ref 70–99)
Potassium: 2.3 mmol/L — CL (ref 3.5–5.1)
Sodium: 133 mmol/L — ABNORMAL LOW (ref 135–145)
Total Bilirubin: 1.1 mg/dL (ref 0.3–1.2)
Total Protein: 6.1 g/dL — ABNORMAL LOW (ref 6.5–8.1)

## 2018-07-04 LAB — LACTIC ACID, PLASMA
Lactic Acid, Venous: 2.6 mmol/L (ref 0.5–1.9)
Lactic Acid, Venous: 2 mmol/L (ref 0.5–1.9)

## 2018-07-04 LAB — CBC WITH DIFFERENTIAL/PLATELET
Abs Immature Granulocytes: 0 10*3/uL (ref 0.00–0.07)
Basophils Absolute: 0 10*3/uL (ref 0.0–0.1)
Basophils Relative: 0 %
Eosinophils Absolute: 0.1 10*3/uL (ref 0.0–0.5)
Eosinophils Relative: 1 %
HCT: 35.7 % — ABNORMAL LOW (ref 36.0–46.0)
Hemoglobin: 11.7 g/dL — ABNORMAL LOW (ref 12.0–15.0)
Lymphocytes Relative: 7 %
Lymphs Abs: 0.7 10*3/uL (ref 0.7–4.0)
MCH: 28.5 pg (ref 26.0–34.0)
MCHC: 32.8 g/dL (ref 30.0–36.0)
MCV: 87.1 fL (ref 80.0–100.0)
Monocytes Absolute: 0.8 10*3/uL (ref 0.1–1.0)
Monocytes Relative: 8 %
Neutro Abs: 8.9 10*3/uL — ABNORMAL HIGH (ref 1.7–7.7)
Neutrophils Relative %: 84 %
Platelets: 301 10*3/uL (ref 150–400)
RBC: 4.1 MIL/uL (ref 3.87–5.11)
RDW: 14.4 % (ref 11.5–15.5)
WBC: 10.6 10*3/uL — ABNORMAL HIGH (ref 4.0–10.5)
nRBC: 0 % (ref 0.0–0.2)
nRBC: 0 /100 WBC

## 2018-07-04 LAB — LIPASE, BLOOD: Lipase: 23 U/L (ref 11–51)

## 2018-07-04 LAB — SARS CORONAVIRUS 2 BY RT PCR (HOSPITAL ORDER, PERFORMED IN ~~LOC~~ HOSPITAL LAB): SARS Coronavirus 2: NEGATIVE

## 2018-07-04 MED ORDER — ACETAMINOPHEN 325 MG PO TABS
650.0000 mg | ORAL_TABLET | Freq: Four times a day (QID) | ORAL | Status: DC | PRN
  Administered 2018-07-06 – 2018-07-07 (×2): 650 mg via ORAL
  Filled 2018-07-04 (×3): qty 2

## 2018-07-04 MED ORDER — POTASSIUM CHLORIDE 10 MEQ/100ML IV SOLN
10.0000 meq | INTRAVENOUS | Status: AC
  Administered 2018-07-04 (×3): 10 meq via INTRAVENOUS
  Filled 2018-07-04 (×3): qty 100

## 2018-07-04 MED ORDER — SODIUM CHLORIDE 0.9 % IV BOLUS
500.0000 mL | Freq: Once | INTRAVENOUS | Status: AC
  Administered 2018-07-04: 18:00:00 500 mL via INTRAVENOUS

## 2018-07-04 MED ORDER — MAGNESIUM SULFATE IN D5W 1-5 GM/100ML-% IV SOLN
1.0000 g | Freq: Once | INTRAVENOUS | Status: AC
  Administered 2018-07-04: 23:00:00 1 g via INTRAVENOUS
  Filled 2018-07-04: qty 100

## 2018-07-04 MED ORDER — SODIUM CHLORIDE 0.9% FLUSH
3.0000 mL | Freq: Once | INTRAVENOUS | Status: AC
  Administered 2018-07-05: 3 mL via INTRAVENOUS

## 2018-07-04 MED ORDER — SODIUM CHLORIDE 0.9 % IV SOLN
INTRAVENOUS | Status: AC
  Administered 2018-07-05: 03:00:00 via INTRAVENOUS

## 2018-07-04 MED ORDER — LABETALOL HCL 5 MG/ML IV SOLN: 10.0000 mg | INTRAVENOUS | Status: DC | PRN

## 2018-07-04 MED ORDER — POTASSIUM CHLORIDE 10 MEQ/100ML IV SOLN
10.0000 meq | INTRAVENOUS | Status: AC
  Administered 2018-07-05 (×3): 10 meq via INTRAVENOUS
  Filled 2018-07-04 (×3): qty 100

## 2018-07-04 MED ORDER — POTASSIUM CHLORIDE CRYS ER 20 MEQ PO TBCR
20.0000 meq | EXTENDED_RELEASE_TABLET | Freq: Once | ORAL | Status: AC
  Administered 2018-07-05: 20 meq via ORAL
  Filled 2018-07-04: qty 1

## 2018-07-04 MED ORDER — INSULIN ASPART 100 UNIT/ML ~~LOC~~ SOLN
0.0000 [IU] | SUBCUTANEOUS | Status: DC
  Administered 2018-07-06 – 2018-07-07 (×2): 2 [IU] via SUBCUTANEOUS
  Administered 2018-07-07: 1 [IU] via SUBCUTANEOUS
  Administered 2018-07-07: 09:00:00 3 [IU] via SUBCUTANEOUS
  Administered 2018-07-08 (×2): 1 [IU] via SUBCUTANEOUS
  Administered 2018-07-08 (×3): 2 [IU] via SUBCUTANEOUS
  Administered 2018-07-09: 3 [IU] via SUBCUTANEOUS
  Administered 2018-07-09: 1 [IU] via SUBCUTANEOUS

## 2018-07-04 MED ORDER — UMECLIDINIUM BROMIDE 62.5 MCG/INH IN AEPB
1.0000 | INHALATION_SPRAY | Freq: Every day | RESPIRATORY_TRACT | Status: DC
  Administered 2018-07-05 – 2018-07-14 (×9): 1 via RESPIRATORY_TRACT
  Filled 2018-07-04 (×2): qty 7

## 2018-07-04 MED ORDER — SODIUM CHLORIDE 0.9% FLUSH
3.0000 mL | Freq: Two times a day (BID) | INTRAVENOUS | Status: DC
  Administered 2018-07-05 – 2018-07-14 (×13): 3 mL via INTRAVENOUS

## 2018-07-04 MED ORDER — ONDANSETRON HCL 4 MG PO TABS: 4.0000 mg | ORAL_TABLET | Freq: Four times a day (QID) | ORAL | Status: DC | PRN

## 2018-07-04 MED ORDER — FAMOTIDINE IN NACL 20-0.9 MG/50ML-% IV SOLN
20.0000 mg | Freq: Two times a day (BID) | INTRAVENOUS | Status: DC
  Administered 2018-07-05 – 2018-07-06 (×5): 20 mg via INTRAVENOUS
  Filled 2018-07-04 (×7): qty 50

## 2018-07-04 MED ORDER — ONDANSETRON HCL 4 MG/2ML IJ SOLN: 4.0000 mg | Freq: Four times a day (QID) | INTRAMUSCULAR | Status: DC | PRN

## 2018-07-04 MED ORDER — ALBUTEROL SULFATE (2.5 MG/3ML) 0.083% IN NEBU: 3.0000 mL | INHALATION_SOLUTION | Freq: Four times a day (QID) | RESPIRATORY_TRACT | Status: DC | PRN

## 2018-07-04 MED ORDER — ACETAMINOPHEN 650 MG RE SUPP
650.0000 mg | Freq: Four times a day (QID) | RECTAL | Status: DC | PRN
  Filled 2018-07-04: qty 1

## 2018-07-04 MED ORDER — ENOXAPARIN SODIUM 40 MG/0.4ML ~~LOC~~ SOLN
40.0000 mg | SUBCUTANEOUS | Status: DC
  Administered 2018-07-05 – 2018-07-14 (×10): 40 mg via SUBCUTANEOUS
  Filled 2018-07-04 (×10): qty 0.4

## 2018-07-04 NOTE — H&P (Signed)
History and Physical    Heather Galaslizabeth W Rawling VHQ:[id number] DOB: 11/11/1940 DOA: 07/04/2018  PCP: Patient, No Pcp Per   Patient coming from: Home   Chief Complaint: Gen weakness, loss of appetite, diarrhea   HPI: Heather Oconnor is a 78 y.o. female with medical history significant for GERD, IBS, hypertension, type 2 diabetes mellitus, chronic diastolic CHF, and COPD, now presenting to the emergency department for evaluation of generalized weakness, loss of appetite, and diarrhea.  Patient was admitted to the hospital from 06/12/2018 until 06/18/2018 with abdominal pain, nausea, vomiting, and loose stools, diagnosed with acute pancreatitis, suspected to have some gallbladder sludge or small stones, there was concern for cirrhosis based on imaging, she was evaluated by GI and surgery during the hospitalization, was recommended for laparoscopic cholecystectomy with liver biopsy and went home in improved condition with outpatient follow-up.  Unfortunately, she has had ongoing weakness with poor appetite and loose stools that has begun to worsen again over the past few days.  She reports some generalized abdominal discomfort but denies abdominal pain per se.  She denies vomiting, but does not feel that she could eat or drink due to loss of appetite and nausea.  She reports nonbloody loose stools, at least 1/day, but not much more than that.  She denies fevers, chills, shortness of breath, cough, chest pain, or palpitations.  She denies any lower extremity swelling or tenderness since leaving the hospital.  ED Course: Upon arrival to the ED, patient is found to be afebrile, saturating mid 90s on room air, slightly tachypneic, and slightly hypertensive.  EKG features a sinus rhythm with PAC and chest x-ray with small residual bilateral pleural effusions and atelectasis without any definite infiltrate or edema.  Chemistry panel is notable for sodium of 133, potassium 2.3, albumin 2.1, normal lipase, and mild  elevation in transaminases.  CBC features a mild leukocytosis and slight normocytic anemia.  Lactic acid is elevated to 2.6.  Patient was treated with IV potassium and IV fluids, C. difficile assay and stool cultures were ordered by the ED physician but the patient has not moved her bowels yet.  Hospitalists are asked to admit.  Review of Systems:  All other systems reviewed and apart from HPI, are negative.  Past Medical History:  Diagnosis Date  . Diabetes (HCC)   . GERD (gastroesophageal reflux disease)   . Hemorrhoids   . Hypertension   . IBS (irritable bowel syndrome)     Past Surgical History:  Procedure Laterality Date  . COLONOSCOPY  09/07/2007   RMR: anal canal/external hemorrhoids, redundant colon, left-sided diverticula, otherwise normal colonic mucosa     reports that she quit smoking about 11 years ago. She quit after 9.00 years of use. She has never used smokeless tobacco. She reports that she does not drink alcohol or use drugs.  No Known Allergies  Family History  Problem Relation Age of Onset  . Colon cancer Neg Hx      Prior to Admission medications   Medication Sig Start Date End Date Taking? Authorizing Provider  acetaminophen (TYLENOL) 500 MG tablet Take 2 tablets (1,000 mg total) by mouth every 6 (six) hours as needed. 06/18/18   Shon HaleEmokpae, Courage, MD  albuterol (VENTOLIN HFA) 108 (90 Base) MCG/ACT inhaler Inhale 2 puffs into the lungs every 6 (six) hours as needed for wheezing or shortness of breath. 06/18/18   Shon HaleEmokpae, Courage, MD  amLODipine (NORVASC) 10 MG tablet Take 1 tablet (10 mg total) by mouth daily. 06/18/18  Roxan Hockey, MD  aspirin EC 81 MG tablet Take 1 tablet (81 mg total) by mouth daily with breakfast. 06/18/18 06/18/19  Roxan Hockey, MD  clopidogrel (PLAVIX) 75 MG tablet Take 75 mg by mouth daily.  07/11/14   [provider]  DORZOLAMIDE HCL-TIMOLOL MAL OP Apply 1 drop to eye daily.     [provider]  furosemide (LASIX)  40 MG tablet Take 1 tablet (40 mg total) by mouth daily. 06/18/18 06/18/19  Roxan Hockey, MD  glipiZIDE (GLUCOTROL XL) 5 MG 24 hr tablet Take 1 tablet (5 mg total) by mouth daily. 06/20/18   Roxan Hockey, MD  hydrALAZINE (APRESOLINE) 50 MG tablet Take 1 tablet (50 mg total) by mouth 3 (three) times daily. 06/18/18   Roxan Hockey, MD  isosorbide mononitrate (IMDUR) 30 MG 24 hr tablet Take 1 tablet (30 mg total) by mouth daily. 06/19/18   Emokpae, Courage, MD  latanoprost (XALATAN) 0.005 % ophthalmic solution Place 1 drop into the right eye at bedtime.  10/24/17 10/24/18  [provider]  lisinopril (ZESTRIL) 20 MG tablet Take 1 tablet (20 mg total) by mouth daily. 06/18/18 06/18/19  Roxan Hockey, MD  loperamide (IMODIUM) 2 MG capsule Take 2 mg by mouth daily. Will take an additional if needed    [provider]  metFORMIN (GLUCOPHAGE) 500 MG tablet Take 1 tablet (500 mg total) by mouth 2 (two) times daily with a meal. 06/18/18   Emokpae, Courage, MD  metoprolol succinate (TOPROL-XL) 100 MG 24 hr tablet Take 1 tablet (100 mg total) by mouth daily. 06/18/18   Roxan Hockey, MD  ondansetron (ZOFRAN) 4 MG tablet Take 1 tablet (4 mg total) by mouth every 6 (six) hours as needed for nausea. 06/18/18   Roxan Hockey, MD  oxyCODONE (OXY IR/ROXICODONE) 5 MG immediate release tablet Take 1 tablet (5 mg total) by mouth every 4 (four) hours as needed for severe pain. 06/20/18   Virl Cagey, MD  pantoprazole (PROTONIX) 40 MG tablet Take 1 tablet (40 mg total) by mouth daily. 06/19/18   Roxan Hockey, MD  umeclidinium bromide (INCRUSE ELLIPTA) 62.5 MCG/INH AEPB Inhale 1 puff into the lungs daily. Dx- J44.1 06/19/18   Roxan Hockey, MD    Physical Exam: Vitals:   07/04/18 1915 07/04/18 2000 07/04/18 2100 07/04/18 2130  BP: (!) 175/69 (!) 164/65 (!) 170/70 (!) 178/73  Pulse: 97 93    Resp: (!) 34 (!) 32 (!) 32 (!) 33  Temp:      TempSrc:      SpO2: 93% 94%    Weight:       Height:        Constitutional: NAD, lethargic  Eyes: PERTLA, lids and conjunctivae normal ENMT: Mucous membranes are moist. Posterior pharynx clear of any exudate or lesions.   Neck: normal, supple, no masses, no thyromegaly Respiratory: no wheezing, no crackles. Mild tachypnea. No accessory muscle use.  Cardiovascular: S1 & S2 heard, regular rate and rhythm. No extremity edema.  Abdomen: No distension, soft, mild generalized tenderness without rebound pain or guarding. Bowel sounds active.  Musculoskeletal: no clubbing / cyanosis. No joint deformity upper and lower extremities.  Skin: no significant rashes, lesions, ulcers. Warm, dry, well-perfused. Neurologic: CN 2-12 grossly intact. Sensation intact. Moving all extremities.  Psychiatric: Alert and oriented to person, place, and situation. Calm, cooperative.    Labs on Admission: I have personally reviewed following labs and imaging studies  CBC: Recent Labs  Lab 07/04/18 1524  WBC 10.6*  NEUTROABS 8.9*  HGB 11.7*  HCT 35.7*  MCV 87.1  PLT 301   Basic Metabolic Panel: Recent Labs  Lab 07/04/18 1524  NA 133*  K 2.3*  CL 87*  CO2 32  GLUCOSE 89  BUN 6*  CREATININE 0.65  CALCIUM 8.2*   GFR: Estimated Creatinine Clearance: 62.1 mL/min (by C-G formula based on SCr of 0.65 mg/dL). Liver Function Tests: Recent Labs  Lab 07/04/18 1524  AST 60*  ALT 45*  ALKPHOS 54  BILITOT 1.1  PROT 6.1*  ALBUMIN 2.1*   Recent Labs  Lab 07/04/18 1524  LIPASE 23   No results for input(s): AMMONIA in the last 168 hours. Coagulation Profile: No results for input(s): INR, PROTIME in the last 168 hours. Cardiac Enzymes: No results for input(s): CKTOTAL, CKMB, CKMBINDEX, TROPONINI in the last 168 hours. BNP (last 3 results) No results for input(s): PROBNP in the last 8760 hours. HbA1C: No results for input(s): HGBA1C in the last 72 hours. CBG: No results for input(s): GLUCAP in the last 168 hours. Lipid Profile: No  results for input(s): CHOL, HDL, LDLCALC, TRIG, CHOLHDL, LDLDIRECT in the last 72 hours. Thyroid Function Tests: No results for input(s): TSH, T4TOTAL, FREET4, T3FREE, THYROIDAB in the last 72 hours. Anemia Panel: No results for input(s): VITAMINB12, FOLATE, FERRITIN, TIBC, IRON, RETICCTPCT in the last 72 hours. Urine analysis:    Component Value Date/Time   COLORURINE AMBER (A) 06/12/2018 0738   APPEARANCEUR HAZY (A) 06/12/2018 0738   LABSPEC 1.038 (H) 06/12/2018 0738   PHURINE 5.0 06/12/2018 0738   GLUCOSEU 150 (A) 06/12/2018 0738   HGBUR NEGATIVE 06/12/2018 0738   BILIRUBINUR SMALL (A) 06/12/2018 0738   KETONESUR NEGATIVE 06/12/2018 0738   PROTEINUR 100 (A) 06/12/2018 0738   NITRITE NEGATIVE 06/12/2018 0738   LEUKOCYTESUR NEGATIVE 06/12/2018 0738   Sepsis Labs: (procalcitonin:4,lacticidven:4) ) Recent Results (from the past 240 hour(s))  SARS Coronavirus 2 (CEPHEID - Performed in [facility] hospital lab), Hosp Order     Status: None   Collection Time: 07/04/18  6:47 PM   Specimen: Nasopharyngeal Swab  Result Value Ref Range Status   SARS Coronavirus 2 NEGATIVE NEGATIVE Final    Comment: (NOTE) If result is NEGATIVE SARS-CoV-2 target nucleic acids are NOT DETECTED. The SARS-CoV-2 RNA is generally detectable in upper and lower  respiratory specimens during the acute phase of infection. The lowest  concentration of SARS-CoV-2 viral copies this assay can detect is 250  copies / mL. A negative result does not preclude SARS-CoV-2 infection  and should not be used as the sole basis for treatment or other  patient management decisions.  A negative result may occur with  improper specimen collection / handling, submission of specimen other  than nasopharyngeal swab, presence of viral mutation(s) within the  areas targeted by this assay, and inadequate number of viral copies  (<250 copies / mL). A negative result must be combined with clinical  observations, patient  history, and epidemiological information. If result is POSITIVE SARS-CoV-2 target nucleic acids are DETECTED. The SARS-CoV-2 RNA is generally detectable in upper and lower  respiratory specimens dur ing the acute phase of infection.  Positive  results are indicative of active infection with SARS-CoV-2.  Clinical  correlation with patient history and other diagnostic information is  necessary to determine patient infection status.  Positive results do  not rule out bacterial infection or co-infection with other viruses. If result is PRESUMPTIVE POSTIVE SARS-CoV-2 nucleic acids MAY BE PRESENT.   A  presumptive positive result was obtained on the submitted specimen  and confirmed on repeat testing.  While 2019 novel coronavirus  (SARS-CoV-2) nucleic acids may be present in the submitted sample  additional confirmatory testing may be necessary for epidemiological  and / or clinical management purposes  to differentiate between  SARS-CoV-2 and other Sarbecovirus currently known to infect humans.  If clinically indicated additional testing with an alternate test  methodology [phone]([id number]) is advised. The SARS-CoV-2 RNA is generally  detectable in upper and lower respiratory sp ecimens during the acute  phase of infection. The expected result is Negative. Fact Sheet for Patients:  BoilerBrush.com.cyhttps://www.fda.gov/media/136312/download Fact Sheet for Healthcare Providers: https://pope.com/https://www.fda.gov/media/136313/download This test is not yet approved or cleared by the [place] FDA and has been authorized for detection and/or diagnosis of SARS-CoV-2 by FDA under an Emergency Use Authorization (EUA).  This EUA will remain in effect (meaning this test can be used) for the duration of the COVID-19 declaration under Section 564(b)(1) of the Act, 21 U.S.C. section 360bbb-3(b)(1), unless the authorization is terminated or revoked sooner. Performed at [facility], [address] [place], [place] [address]       Radiological Exams on Admission: Dg Chest 2 View  Result Date: 07/04/2018 CLINICAL DATA:  Weakness and decreased appetite. EXAM: CHEST - 2 VIEW COMPARISON:  Chest x-ray 06/14/2018 and chest CT 06/16/2018 FINDINGS: The heart is borderline enlarged but stable. There is mild tortuosity and calcification of the thoracic aorta. There are persistent small residual bilateral pleural effusions and bibasilar atelectasis. No edema or infiltrates and no worrisome pulmonary lesions. The bony thorax is intact. IMPRESSION: Small residual bilateral pleural effusions and bibasilar atelectasis but no definite infiltrates or edema. Electronically Signed   By: Rudie MeyerP.  Gallerani M.D.   On: 07/04/2018 16:56    EKG: Independently reviewed. Sinus rhythm, PAC, QTc 463 ms.   Assessment/Plan   1. Generalized weakness; anorexia; loose stools; elevated LFT's  - Presents with anorexia, loose stools, and progressive generalized weakness to the point that she has difficulty getting out of bed  - No focal neurologic deficits identified  - Lipase, bilirubin, and transaminases all normalized by time of recent discharge but transaminases now slightly elevated again   - She reports having upcoming appt with surgery with plan for lap chole and liver biopsy  - She is afebrile, WBC has decreased, blood cultures collected in ED  - Check UA, CK, TSH, ammonia, and RUQ US - Continue gentle IVF hydration, electrolyte replacement, symptom-control    2. Hypokalemia  - Serum potassium is 2.3 in ED, likely secondary to GI-losses  - Replaced with 40 mEq IV potassium in ED, given mag empirically  - Give oral potassium if she can tolerate, continue cardiac monitoring and repeat chem panel in am    3. Chronic diastolic CHF  - Appears compensated  - Developed acute CHF with IVF during recent admission  - Continue gentle IVF hydration in setting of loose stools and anorexia, monitor daily wt and I/O's    4. COPD  - No cough or wheezing on  admission  - Continue Incruse and as-needed albuterol    5. Type II DM  - No A1c on file  - Managed at home with metformin and glipizide, held on admission  - Check CBG's and use a low-intensity SSI with Novolog for now    6. Hypertension  - Use IV labetalol as needed until she is consistently tolerating PO     PPE: Mask, face  shield, gown, gloves. Patient wearing mask.  DVT prophylaxis: Lovenox  Code Status: Full  Family Communication: Discussed with patient  Consults called: None  Admission status: Observation     Briscoe Deutscher, MD [facility] Pager [phone]  If 7PM-7AM, please contact night-coverage [email or web address] Password [email or web address]  07/04/2018, 9:50 PM

## 2018-07-04 NOTE — ED Provider Notes (Signed)
[facility] Provider Note   CSN: [id number] Arrival date & time: 07/04/18  1448     History   Chief Complaint Chief Complaint  Patient presents with  . Weakness    HPI Heather Oconnor is a 78 y.o. female.  She was recently admitted any pen for nausea weakness and found to possibly have some element of pancreatitis and may be micro-gallstones.  She was offered surgery but ultimately did not get surgery and was discharged.  She said since she has been home she has been running intermittent fevers and feeling weak and has no appetite.  She says everything tastes terrible.  She is getting generally weak and ultimately family brought her here for evaluation.  She denies any cough chest pain shortness of breath.  She has some generalized abdominal soreness.  She said she has loose stools daily.     The history is provided by the patient.  Weakness Severity:  Severe Onset quality:  Gradual Timing:  Constant Progression:  Worsening Context: recent infection   Relieved by:  Nothing Worsened by:  Activity Ineffective treatments:  Drinking fluids and medication Associated symptoms: abdominal pain, anorexia, diarrhea, difficulty walking, fever and nausea   Associated symptoms: no chest pain, no cough, no dysuria, no headaches, no melena, no seizures, no shortness of breath, no stroke symptoms, no urgency and no vomiting     Past Medical History:  Diagnosis Date  . Diabetes (HCC)   . GERD (gastroesophageal reflux disease)   . Hemorrhoids   . Hypertension   . IBS (irritable bowel syndrome)     Patient Active Problem List   Diagnosis Date Noted  . Thirst 06/29/2018  . Acute on chronic heart failure with preserved ejection fraction (HFpEF) (HCC) 06/18/2018  . Dyspnea and respiratory abnormalities   . Acute pancreatitis 06/12/2018  . Type 2 diabetes mellitus without complication (HCC) 06/12/2018  . Elevated LFTs/Needs Liver Biopsy   .  Irritable bowel syndrome 02/04/2015  . Abdominal pain 11/27/2014  . Diarrhea 11/27/2014  . FECAL URGENCY 07/24/2009  . SMOKER 05/08/2008  . Essential hypertension 05/08/2008  . Transient cerebral ischemia 05/08/2008  . INTERNAL HEMORRHOIDS 05/08/2008  . HEMORRHOIDS, EXTERNAL 05/08/2008  . EROSIVE ESOPHAGITIS 05/08/2008  . GERD 05/08/2008  . HIATAL HERNIA 05/08/2008  . PROLAPSE, RECTAL 05/08/2008  . FECAL INCONTINENCE 05/08/2008  . COLONIC POLYPS, ADENOMATOUS, HX OF 05/08/2008    Past Surgical History:  Procedure Laterality Date  . COLONOSCOPY  09/07/2007   RMR: anal canal/external hemorrhoids, redundant colon, left-sided diverticula, otherwise normal colonic mucosa     OB History   No obstetric history on file.      Home Medications    Prior to Admission medications   Medication Sig Start Date End Date Taking? Authorizing Provider  acetaminophen (TYLENOL) 500 MG tablet Take 2 tablets (1,000 mg total) by mouth every 6 (six) hours as needed. 06/18/18   Shon HaleEmokpae, Courage, MD  albuterol (VENTOLIN HFA) 108 (90 Base) MCG/ACT inhaler Inhale 2 puffs into the lungs every 6 (six) hours as needed for wheezing or shortness of breath. 06/18/18   Shon HaleEmokpae, Courage, MD  amLODipine (NORVASC) 10 MG tablet Take 1 tablet (10 mg total) by mouth daily. 06/18/18   Shon HaleEmokpae, Courage, MD  aspirin EC 81 MG tablet Take 1 tablet (81 mg total) by mouth daily with breakfast. 06/18/18 06/18/19  Shon HaleEmokpae, Courage, MD  clopidogrel (PLAVIX) 75 MG tablet Take 75 mg by mouth daily.  07/11/14   [provider]  DORZOLAMIDE HCL-TIMOLOL MAL OP Apply 1 drop to eye daily.     [provider]  furosemide (LASIX) 40 MG tablet Take 1 tablet (40 mg total) by mouth daily. 06/18/18 06/18/19  Shon HaleEmokpae, Courage, MD  glipiZIDE (GLUCOTROL XL) 5 MG 24 hr tablet Take 1 tablet (5 mg total) by mouth daily. 06/20/18   Shon HaleEmokpae, Courage, MD  hydrALAZINE (APRESOLINE) 50 MG tablet Take 1 tablet (50 mg total) by mouth 3 (three) times  daily. 06/18/18   Shon HaleEmokpae, Courage, MD  isosorbide mononitrate (IMDUR) 30 MG 24 hr tablet Take 1 tablet (30 mg total) by mouth daily. 06/19/18   Emokpae, Courage, MD  latanoprost (XALATAN) 0.005 % ophthalmic solution Place 1 drop into the right eye at bedtime.  10/24/17 10/24/18  [provider]  lisinopril (ZESTRIL) 20 MG tablet Take 1 tablet (20 mg total) by mouth daily. 06/18/18 06/18/19  Shon HaleEmokpae, Courage, MD  loperamide (IMODIUM) 2 MG capsule Take 2 mg by mouth daily. Will take an additional if needed    [provider]  metFORMIN (GLUCOPHAGE) 500 MG tablet Take 1 tablet (500 mg total) by mouth 2 (two) times daily with a meal. 06/18/18   Emokpae, Courage, MD  metoprolol succinate (TOPROL-XL) 100 MG 24 hr tablet Take 1 tablet (100 mg total) by mouth daily. 06/18/18   Shon HaleEmokpae, Courage, MD  ondansetron (ZOFRAN) 4 MG tablet Take 1 tablet (4 mg total) by mouth every 6 (six) hours as needed for nausea. 06/18/18   Shon HaleEmokpae, Courage, MD  oxyCODONE (OXY IR/ROXICODONE) 5 MG immediate release tablet Take 1 tablet (5 mg total) by mouth every 4 (four) hours as needed for severe pain. 06/20/18   Lucretia RoersBridges, Lindsay C, MD  pantoprazole (PROTONIX) 40 MG tablet Take 1 tablet (40 mg total) by mouth daily. 06/19/18   Shon HaleEmokpae, Courage, MD  umeclidinium bromide (INCRUSE ELLIPTA) 62.5 MCG/INH AEPB Inhale 1 puff into the lungs daily. Dx- J44.1 06/19/18   Shon HaleEmokpae, Courage, MD    Family History Family History  Problem Relation Age of Onset  . Colon cancer Neg Hx     Social History Social History   Tobacco Use  . Smoking status: Former Smoker    Years: 9.00    Quit date: 11/27/2006    Years since quitting: 11.6  . Smokeless tobacco: Never Used  Substance Use Topics  . Alcohol use: No    Alcohol/week: 0.0 standard drinks  . Drug use: No     Allergies   Patient has no known allergies.   Review of Systems Review of Systems  Constitutional: Positive for appetite change and fever.  HENT: Negative  for sore throat.   Eyes: Negative for visual disturbance.  Respiratory: Negative for cough and shortness of breath.   Cardiovascular: Negative for chest pain.  Gastrointestinal: Positive for abdominal pain, anorexia, diarrhea and nausea. Negative for melena and vomiting.  Genitourinary: Negative for dysuria and urgency.  Musculoskeletal: Negative for neck pain.  Skin: Negative for rash.  Neurological: Positive for weakness. Negative for seizures and headaches.     Physical Exam Updated Vital Signs BP (!) 146/63   Pulse 94   Temp 98.3 F (36.8 C) (Oral)   Resp (!) 25   Ht 5\' 5"  (1.651 m)   SpO2 93%   BMI 31.29 kg/m   Physical Exam Vitals signs and nursing note reviewed.  Constitutional:      General: She is not in acute distress.    Appearance: She is well-developed.  HENT:  Head: Normocephalic and atraumatic.  Eyes:     Conjunctiva/sclera: Conjunctivae normal.  Neck:     Musculoskeletal: Neck supple.  Cardiovascular:     Rate and Rhythm: Normal rate and regular rhythm.     Heart sounds: No murmur.  Pulmonary:     Effort: Pulmonary effort is normal. No respiratory distress.     Breath sounds: Normal breath sounds.  Abdominal:     Palpations: Abdomen is soft.     Tenderness: There is no abdominal tenderness. There is no guarding or rebound.  Musculoskeletal: Normal range of motion.     Right lower leg: No edema.     Left lower leg: No edema.  Skin:    General: Skin is warm and dry.     Capillary Refill: Capillary refill takes less than 2 seconds.  Neurological:     General: No focal deficit present.     Mental Status: She is alert. Mental status is at baseline.      ED Treatments / Results  Labs (all labs ordered are listed, but only abnormal results are displayed) Labs Reviewed  LACTIC ACID, PLASMA - Abnormal; Notable for the following components:      Result Value   Lactic Acid, Venous 2.6 (*)    All other components within normal limits  CBC WITH  DIFFERENTIAL/PLATELET - Abnormal; Notable for the following components:   WBC 10.6 (*)    Hemoglobin 11.7 (*)    HCT 35.7 (*)    Neutro Abs 8.9 (*)    All other components within normal limits  CULTURE, BLOOD (ROUTINE X 2)  CULTURE, BLOOD (ROUTINE X 2)  GASTROINTESTINAL PANEL BY PCR, STOOL (REPLACES STOOL CULTURE)  C DIFFICILE QUICK SCREEN W PCR REFLEX  SARS CORONAVIRUS 2 (HOSPITAL ORDER, PERFORMED IN [facility])  LACTIC ACID, PLASMA  COMPREHENSIVE METABOLIC PANEL  URINALYSIS, ROUTINE W REFLEX MICROSCOPIC    EKG EKG Interpretation  Date/Time:  Tuesday July 04 2018 18:57:17 EDT Ventricular Rate:  96 PR Interval:    QRS Duration: 93 QT Interval:  366 QTC Calculation: 463 R Axis:   23 Text Interpretation:  Sinus rhythm Atrial premature complex Low voltage, precordial leads Borderline T abnormalities, anterior leads similar to prior 6/20 Confirmed by Meridee ScoreButler, Markes Shatswell [phone]([id number]) on 07/04/2018 7:00:51 PM   Radiology Dg Chest 2 View  Result Date: 07/04/2018 CLINICAL DATA:  Weakness and decreased appetite. EXAM: CHEST - 2 VIEW COMPARISON:  Chest x-ray 06/14/2018 and chest CT 06/16/2018 FINDINGS: The heart is borderline enlarged but stable. There is mild tortuosity and calcification of the thoracic aorta. There are persistent small residual bilateral pleural effusions and bibasilar atelectasis. No edema or infiltrates and no worrisome pulmonary lesions. The bony thorax is intact. IMPRESSION: Small residual bilateral pleural effusions and bibasilar atelectasis but no definite infiltrates or edema. Electronically Signed   By: Rudie MeyerP.  Gallerani M.D.   On: 07/04/2018 16:56    Procedures Procedures (including critical care time)  Medications Ordered in ED Medications  sodium chloride flush (NS) 0.9 % injection 3 mL (has no administration in time range)  sodium chloride 0.9 % bolus 500 mL (has no administration in time range)     Initial Impression / Assessment and Plan / ED  Course  I have reviewed the triage vital signs and the nursing notes.  Pertinent labs & imaging results that were available during my care of the patient were reviewed by me and considered in my medical decision making (see chart for details).  Clinical  Course as of Jul 05 850  Tue Jul 04, 2347  [id number] 78 year old female with recent admission for pancreatitis thought possibly related to gallstones here with continued weakness intermittent fevers nausea and some daily loose stools.  She was mildly tachycardic and afebrile on presentation.  She has fairly benign exam although she complains of some mild diffuse abdominal tenderness.  Differential includes cholecystitis, cholangitis, pancreatitis, C. difficile, Covid, infectious diarrhea.   [MB]    Clinical Course User Index [MB] Hayden Rasmussen, MD   Heather Oconnor was evaluated in Emergency Department on 07/04/2018 for the symptoms described in the history of present illness. She was evaluated in the context of the global COVID-19 pandemic, which necessitated consideration that the patient might be at risk for infection with the SARS-CoV-2 virus that causes COVID-19. Institutional protocols and algorithms that pertain to the evaluation of patients at risk for COVID-19 are in a state of rapid change based on information released by regulatory bodies including the CDC and federal and state organizations. These policies and algorithms were followed during the patient's care in the ED.      Final Clinical Impressions(s) / ED Diagnoses   Final diagnoses:  Generalized weakness  Hypokalemia    ED Discharge Orders    None       Hayden Rasmussen, MD 07/05/18 [id number]

## 2018-07-04 NOTE — ED Notes (Signed)
Heather Oconnor Husband [phone]

## 2018-07-04 NOTE — ED Notes (Signed)
Pt unable to use bedpan after several attempts (3 times).

## 2018-07-04 NOTE — ED Triage Notes (Signed)
Pt states she has been having generalized weakness/ decreased appetite. Pt has been running fevers. Pt states she was recently hospitalized with gall bladder issues. Pt poor historian

## 2018-07-04 NOTE — ED Notes (Signed)
Dr. Melina Copa aware of potassium and lactic.

## 2018-07-04 NOTE — ED Notes (Signed)
ED TO INPATIENT HANDOFF REPORT  ED Nurse Name and Phone #:  [phone]  S Name/Age/Gender Heather Oconnor 78 y.o. female Room/Bed: 034C/034C  Code Status   Code Status: Prior  Home/SNF/Other Home Patient oriented to: self, place, time and situation Is this baseline? Yes   Triage Complete: Triage complete  Chief Complaint abd pain  Triage Note Pt states she has been having generalized weakness/ decreased appetite. Pt has been running fevers. Pt states she was recently hospitalized with gall bladder issues. Pt poor historian   Allergies No Known Allergies  Level of Care/Admitting Diagnosis ED Disposition    ED Disposition Condition [facility] Area: [place] [[id number]]  Level of Care: Telemetry Medical [104]  I expect the patient will be discharged within 24 hours: Yes  LOW acuity---Tx typically complete <24 hrs---ACUTE conditions typically can be evaluated <24 hours---LABS likely to return to acceptable levels <24 hours---IS near functional baseline---EXPECTED to return to current living arrangement---NOT newly hypoxic: Does not meet criteria for 5C-Observation unit  Covid Evaluation: Screening Protocol (No Symptoms)  Diagnosis: Abdominal pain, vomiting, and diarrhea [3716967]  Admitting Physician: Vianne Bulls [[id number]]  Attending Physician: Vianne Bulls [[id number]]  PT Class (Do Not Modify): Observation [104]  PT Acc Code (Do Not Modify): Observation [10022]       B Medical/Surgery History Past Medical History:  Diagnosis Date  . Diabetes ([place])   . GERD (gastroesophageal reflux disease)   . Hemorrhoids   . Hypertension   . IBS (irritable bowel syndrome)    Past Surgical History:  Procedure Laterality Date  . COLONOSCOPY  09/07/2007   RMR: anal canal/external hemorrhoids, redundant colon, left-sided diverticula, otherwise normal colonic mucosa     A IV Location/Drains/Wounds Patient Lines/Drains/Airways Status    Active Line/Drains/Airways    Name:   Placement date:   Placement time:   Site:   Days:   Peripheral IV 06/12/18 Left;Posterior Hand   06/12/18    1651    Hand   22   Peripheral IV 06/16/18 Anterior;Right Forearm   06/16/18    1303    Forearm   18   Peripheral IV 07/04/18 Right Hand   07/04/18    1740    Hand   less than 1   External Urinary Catheter   06/13/18    0830    -   21          Intake/Output Last 24 hours No intake or output data in the 24 hours ending 07/04/18 2119  Labs/Imaging Results for orders placed or performed during the hospital encounter of 07/04/18 (from the past 48 hour(s))  Comprehensive metabolic panel     Status: Abnormal   Collection Time: 07/04/18  3:24 PM  Result Value Ref Range   Sodium 133 (L) 135 - 145 mmol/L   Potassium 2.3 (LL) 3.5 - 5.1 mmol/L    Comment: CRITICAL RESULT CALLED TO, READ BACK BY AND VERIFIED WITH: M.GAGE RN 1741 07/04/2018 MCCORMICK K    Chloride 87 (L) 98 - 111 mmol/L   CO2 32 22 - 32 mmol/L   Glucose, Bld 89 70 - 99 mg/dL   BUN 6 (L) 8 - 23 mg/dL   Creatinine, Ser 0.65 0.44 - 1.00 mg/dL   Calcium 8.2 (L) 8.9 - 10.3 mg/dL   Total Protein 6.1 (L) 6.5 - 8.1 g/dL   Albumin 2.1 (L) 3.5 - 5.0 g/dL   AST 60 (H) 15 - 41 U/L  ALT 45 (H) 0 - 44 U/L   Alkaline Phosphatase 54 38 - 126 U/L   Total Bilirubin 1.1 0.3 - 1.2 mg/dL   GFR calc non Af Amer >60 >60 mL/min   GFR calc Af Amer >60 >60 mL/min   Anion gap 14 5 - 15    Comment: Performed at [facility], [address] [place], [place] [address]  CBC with Differential     Status: Abnormal   Collection Time: 07/04/18  3:24 PM  Result Value Ref Range   WBC 10.6 (H) 4.0 - 10.5 K/uL   RBC 4.10 3.87 - 5.11 MIL/uL   Hemoglobin 11.7 (L) 12.0 - 15.0 g/dL   HCT 60.435.7 (L) 54.036.0 - 98.146.0 %   MCV 87.1 80.0 - 100.0 fL   MCH 28.5 26.0 - 34.0 pg   MCHC 32.8 30.0 - 36.0 g/dL   RDW 19.114.4 47.811.5 - 29.515.5 %   Platelets 301 150 - 400 K/uL   nRBC 0.0 0.0 - 0.2 %   Neutrophils Relative % 84 %    Neutro Abs 8.9 (H) 1.7 - 7.7 K/uL   Lymphocytes Relative 7 %   Lymphs Abs 0.7 0.7 - 4.0 K/uL   Monocytes Relative 8 %   Monocytes Absolute 0.8 0.1 - 1.0 K/uL   Eosinophils Relative 1 %   Eosinophils Absolute 0.1 0.0 - 0.5 K/uL   Basophils Relative 0 %   Basophils Absolute 0.0 0.0 - 0.1 K/uL   nRBC 0 0 /100 WBC   Abs Immature Granulocytes 0.00 0.00 - 0.07 K/uL   Polychromasia PRESENT     Comment: Performed at [facility], [address] [place], [place] [address]  Lipase, blood     Status: None   Collection Time: 07/04/18  3:24 PM  Result Value Ref Range   Lipase 23 11 - 51 U/L    Comment: Performed at [facility], [address] [place], [place] [address]  Lactic acid, plasma     Status: Abnormal   Collection Time: 07/04/18  4:38 PM  Result Value Ref Range   Lactic Acid, Venous 2.6 (HH) 0.5 - 1.9 mmol/L    Comment: M.GAGE RN 1718 07/04/2018 MCCORMICK K Performed at [facility], [address] [place], [place] [address]   Lactic acid, plasma     Status: Abnormal   Collection Time: 07/04/18  5:40 PM  Result Value Ref Range   Lactic Acid, Venous 2.0 (HH) 0.5 - 1.9 mmol/L    Comment: CRITICAL RESULT CALLED TO, READ BACK BY AND VERIFIED WITH: M.GAGE RN 1819 07/04/2018 MCCORMICK K Performed at [facility], [address] [place], [place] [address]   SARS Coronavirus 2 (CEPHEID - Performed in [facility] hospital lab), Hosp Order     Status: None   Collection Time: 07/04/18  6:47 PM   Specimen: Nasopharyngeal Swab  Result Value Ref Range   SARS Coronavirus 2 NEGATIVE NEGATIVE    Comment: (NOTE) If result is NEGATIVE SARS-CoV-2 target nucleic acids are NOT DETECTED. The SARS-CoV-2 RNA is generally detectable in upper and lower  respiratory specimens during the acute phase of infection. The lowest  concentration of SARS-CoV-2 viral copies this assay can detect is 250  copies / mL. A negative result does not preclude SARS-CoV-2 infection  and  should not be used as the sole basis for treatment or other  patient management decisions.  A negative result may occur with  improper specimen  collection / handling, submission of specimen other  than nasopharyngeal swab, presence of viral mutation(s) within the  areas targeted by this assay, and inadequate number of viral copies  (<250 copies / mL). A negative result must be combined with clinical  observations, patient history, and epidemiological information. If result is POSITIVE SARS-CoV-2 target nucleic acids are DETECTED. The SARS-CoV-2 RNA is generally detectable in upper and lower  respiratory specimens dur ing the acute phase of infection.  Positive  results are indicative of active infection with SARS-CoV-2.  Clinical  correlation with patient history and other diagnostic information is  necessary to determine patient infection status.  Positive results do  not rule out bacterial infection or co-infection with other viruses. If result is PRESUMPTIVE POSTIVE SARS-CoV-2 nucleic acids MAY BE PRESENT.   A presumptive positive result was obtained on the submitted specimen  and confirmed on repeat testing.  While 2019 novel coronavirus  (SARS-CoV-2) nucleic acids may be present in the submitted sample  additional confirmatory testing may be necessary for epidemiological  and / or clinical management purposes  to differentiate between  SARS-CoV-2 and other Sarbecovirus currently known to infect humans.  If clinically indicated additional testing with an alternate test  methodology [phone] is advised. The SARS-CoV-2 RNA is generally  detectable in upper and lower respiratory sp ecimens during the acute  phase of infection. The expected result is Negative. Fact Sheet for Patients:  BoilerBrush.com.cyhttps://www.fda.gov/media/136312/download Fact Sheet for Healthcare Providers: https://pope.com/https://www.fda.gov/media/136313/download This test is not yet approved or cleared by the [place] FDA and has been  authorized for detection and/or diagnosis of SARS-CoV-2 by FDA under an Emergency Use Authorization (EUA).  This EUA will remain in effect (meaning this test can be used) for the duration of the COVID-19 declaration under Section 564(b)(1) of the Act, 21 U.S.C. section 360bbb-3(b)(1), unless the authorization is terminated or revoked sooner. Performed at [facility], [address] [place], [place] [address]    Dg Chest 2 View  Result Date: 07/04/2018 CLINICAL DATA:  Weakness and decreased appetite. EXAM: CHEST - 2 VIEW COMPARISON:  Chest x-ray 06/14/2018 and chest CT 06/16/2018 FINDINGS: The heart is borderline enlarged but stable. There is mild tortuosity and calcification of the thoracic aorta. There are persistent small residual bilateral pleural effusions and bibasilar atelectasis. No edema or infiltrates and no worrisome pulmonary lesions. The bony thorax is intact. IMPRESSION: Small residual bilateral pleural effusions and bibasilar atelectasis but no definite infiltrates or edema. Electronically Signed   By: Rudie MeyerP.  Gallerani M.D.   On: 07/04/2018 16:56    Pending Labs Unresulted Labs (From admission, onward)    Start     Ordered   07/04/18 1723  Culture, blood (routine x 2)  BLOOD CULTURE X 2,   STAT     07/04/18 1722   07/04/18 1723  Gastrointestinal Panel by PCR , Stool  (Gastrointestinal Panel by PCR, Stool)  Once,   STAT     07/04/18 1722   07/04/18 1723  C Difficile Quick Screen w PCR reflex  (Gastrointestinal Panel by PCR, Stool)  Once, for 24 hours,   STAT     07/04/18 1722   07/04/18 1524  Urinalysis, Routine w reflex microscopic  ONCE - STAT,   STAT     07/04/18 1523          Vitals/Pain Today's Vitals   07/04/18 1915 07/04/18 1943 07/04/18 2000 07/04/18 2100  BP: (!) 175/69  (!) 164/65 (!) 170/70  Pulse: 97  93   Resp: (!) 34  (!) 32 (!) 32  Temp:      TempSrc:      SpO2: 93%  94%   Weight:      Height:      PainSc:  0-No pain      Isolation  Precautions Enteric precautions (UV disinfection)  Medications Medications  sodium chloride flush (NS) 0.9 % injection 3 mL (has no administration in time range)  potassium chloride 10 mEq in 100 mL IVPB (10 mEq Intravenous New Bag/Given 07/04/18 2117)  sodium chloride 0.9 % bolus 500 mL (500 mLs Intravenous New Bag/Given 07/04/18 1755)    Mobility walks with device Low fall risk   Focused Assessments Cardiac Assessment Handoff:  Cardiac Rhythm: Normal sinus rhythm Lab Results  Component Value Date   TROPONINI <0.03 06/12/2018   No results found for: DDIMER Does the Patient currently have chest pain? No     R Recommendations: See Admitting Provider Note  Report given to:   Additional Notes:

## 2018-07-05 ENCOUNTER — Encounter (HOSPITAL_COMMUNITY): Payer: Self-pay | Admitting: Internal Medicine

## 2018-07-05 DIAGNOSIS — J439 Emphysema, unspecified: Secondary | ICD-10-CM | POA: Diagnosis present

## 2018-07-05 DIAGNOSIS — K219 Gastro-esophageal reflux disease without esophagitis: Secondary | ICD-10-CM | POA: Diagnosis present

## 2018-07-05 DIAGNOSIS — R509 Fever, unspecified: Secondary | ICD-10-CM | POA: Diagnosis not present

## 2018-07-05 DIAGNOSIS — Z7984 Long term (current) use of oral hypoglycemic drugs: Secondary | ICD-10-CM | POA: Diagnosis not present

## 2018-07-05 DIAGNOSIS — Z87891 Personal history of nicotine dependence: Secondary | ICD-10-CM | POA: Diagnosis not present

## 2018-07-05 DIAGNOSIS — Z79899 Other long term (current) drug therapy: Secondary | ICD-10-CM | POA: Diagnosis not present

## 2018-07-05 DIAGNOSIS — K863 Pseudocyst of pancreas: Secondary | ICD-10-CM | POA: Diagnosis present

## 2018-07-05 DIAGNOSIS — K8511 Biliary acute pancreatitis with uninfected necrosis: Secondary | ICD-10-CM | POA: Diagnosis not present

## 2018-07-05 DIAGNOSIS — Z20828 Contact with and (suspected) exposure to other viral communicable diseases: Secondary | ICD-10-CM | POA: Diagnosis present

## 2018-07-05 DIAGNOSIS — E876 Hypokalemia: Secondary | ICD-10-CM | POA: Diagnosis present

## 2018-07-05 DIAGNOSIS — Z7951 Long term (current) use of inhaled steroids: Secondary | ICD-10-CM | POA: Diagnosis not present

## 2018-07-05 DIAGNOSIS — E538 Deficiency of other specified B group vitamins: Secondary | ICD-10-CM

## 2018-07-05 DIAGNOSIS — Z7982 Long term (current) use of aspirin: Secondary | ICD-10-CM | POA: Diagnosis not present

## 2018-07-05 DIAGNOSIS — Z6830 Body mass index (BMI) 30.0-30.9, adult: Secondary | ICD-10-CM | POA: Diagnosis not present

## 2018-07-05 DIAGNOSIS — E871 Hypo-osmolality and hyponatremia: Secondary | ICD-10-CM | POA: Diagnosis present

## 2018-07-05 DIAGNOSIS — I10 Essential (primary) hypertension: Secondary | ICD-10-CM | POA: Diagnosis not present

## 2018-07-05 DIAGNOSIS — R531 Weakness: Secondary | ICD-10-CM

## 2018-07-05 DIAGNOSIS — E43 Unspecified severe protein-calorie malnutrition: Secondary | ICD-10-CM | POA: Diagnosis present

## 2018-07-05 DIAGNOSIS — R634 Abnormal weight loss: Secondary | ICD-10-CM | POA: Diagnosis not present

## 2018-07-05 DIAGNOSIS — K7460 Unspecified cirrhosis of liver: Secondary | ICD-10-CM | POA: Diagnosis present

## 2018-07-05 DIAGNOSIS — I110 Hypertensive heart disease with heart failure: Secondary | ICD-10-CM | POA: Diagnosis present

## 2018-07-05 DIAGNOSIS — R1010 Upper abdominal pain, unspecified: Secondary | ICD-10-CM | POA: Diagnosis not present

## 2018-07-05 DIAGNOSIS — I5032 Chronic diastolic (congestive) heart failure: Secondary | ICD-10-CM | POA: Diagnosis present

## 2018-07-05 DIAGNOSIS — R109 Unspecified abdominal pain: Secondary | ICD-10-CM | POA: Diagnosis not present

## 2018-07-05 DIAGNOSIS — R1110 Vomiting, unspecified: Secondary | ICD-10-CM | POA: Diagnosis not present

## 2018-07-05 DIAGNOSIS — R197 Diarrhea, unspecified: Secondary | ICD-10-CM | POA: Diagnosis not present

## 2018-07-05 DIAGNOSIS — R740 Nonspecific elevation of levels of transaminase and lactic acid dehydrogenase [LDH]: Secondary | ICD-10-CM | POA: Diagnosis not present

## 2018-07-05 DIAGNOSIS — K8512 Biliary acute pancreatitis with infected necrosis: Secondary | ICD-10-CM | POA: Diagnosis not present

## 2018-07-05 DIAGNOSIS — Z7902 Long term (current) use of antithrombotics/antiplatelets: Secondary | ICD-10-CM | POA: Diagnosis not present

## 2018-07-05 DIAGNOSIS — I700 Atherosclerosis of aorta: Secondary | ICD-10-CM | POA: Diagnosis present

## 2018-07-05 DIAGNOSIS — D649 Anemia, unspecified: Secondary | ICD-10-CM | POA: Diagnosis present

## 2018-07-05 DIAGNOSIS — Y929 Unspecified place or not applicable: Secondary | ICD-10-CM | POA: Diagnosis not present

## 2018-07-05 DIAGNOSIS — R630 Anorexia: Secondary | ICD-10-CM | POA: Diagnosis not present

## 2018-07-05 DIAGNOSIS — Z79891 Long term (current) use of opiate analgesic: Secondary | ICD-10-CM | POA: Diagnosis not present

## 2018-07-05 DIAGNOSIS — J9601 Acute respiratory failure with hypoxia: Secondary | ICD-10-CM | POA: Diagnosis present

## 2018-07-05 DIAGNOSIS — E119 Type 2 diabetes mellitus without complications: Secondary | ICD-10-CM | POA: Diagnosis present

## 2018-07-05 DIAGNOSIS — R1310 Dysphagia, unspecified: Secondary | ICD-10-CM | POA: Diagnosis present

## 2018-07-05 DIAGNOSIS — J449 Chronic obstructive pulmonary disease, unspecified: Secondary | ICD-10-CM | POA: Diagnosis not present

## 2018-07-05 DIAGNOSIS — R945 Abnormal results of liver function studies: Secondary | ICD-10-CM | POA: Diagnosis not present

## 2018-07-05 HISTORY — DX: Weakness: R53.1

## 2018-07-05 LAB — CBC WITH DIFFERENTIAL/PLATELET
Abs Immature Granulocytes: 0 10*3/uL (ref 0.00–0.07)
Basophils Absolute: 0.1 10*3/uL (ref 0.0–0.1)
Basophils Relative: 1 %
Eosinophils Absolute: 0.1 10*3/uL (ref 0.0–0.5)
Eosinophils Relative: 2 %
HCT: 33.2 % — ABNORMAL LOW (ref 36.0–46.0)
Hemoglobin: 11 g/dL — ABNORMAL LOW (ref 12.0–15.0)
Lymphocytes Relative: 10 %
Lymphs Abs: 0.7 10*3/uL (ref 0.7–4.0)
MCH: 28.6 pg (ref 26.0–34.0)
MCHC: 33.1 g/dL (ref 30.0–36.0)
MCV: 86.5 fL (ref 80.0–100.0)
Monocytes Absolute: 0.7 10*3/uL (ref 0.1–1.0)
Monocytes Relative: 10 %
Neutro Abs: 5 10*3/uL (ref 1.7–7.7)
Neutrophils Relative %: 77 %
Platelets: 213 10*3/uL (ref 150–400)
RBC: 3.84 MIL/uL — ABNORMAL LOW (ref 3.87–5.11)
RDW: 14.4 % (ref 11.5–15.5)
WBC: 6.5 10*3/uL (ref 4.0–10.5)
nRBC: 0 % (ref 0.0–0.2)
nRBC: 0 /100 WBC

## 2018-07-05 LAB — GLUCOSE, CAPILLARY
Glucose-Capillary: 100 mg/dL — ABNORMAL HIGH (ref 70–99)
Glucose-Capillary: 102 mg/dL — ABNORMAL HIGH (ref 70–99)
Glucose-Capillary: 103 mg/dL — ABNORMAL HIGH (ref 70–99)
Glucose-Capillary: 108 mg/dL — ABNORMAL HIGH (ref 70–99)
Glucose-Capillary: 118 mg/dL — ABNORMAL HIGH (ref 70–99)
Glucose-Capillary: 93 mg/dL (ref 70–99)

## 2018-07-05 LAB — URINALYSIS, ROUTINE W REFLEX MICROSCOPIC
Bilirubin Urine: NEGATIVE
Glucose, UA: NEGATIVE mg/dL
Hgb urine dipstick: NEGATIVE
Ketones, ur: NEGATIVE mg/dL
Leukocytes,Ua: NEGATIVE
Nitrite: NEGATIVE
Protein, ur: 100 mg/dL — AB
Specific Gravity, Urine: 1.018 (ref 1.005–1.030)
pH: 6 (ref 5.0–8.0)

## 2018-07-05 LAB — COMPREHENSIVE METABOLIC PANEL
ALT: 38 U/L (ref 0–44)
AST: 43 U/L — ABNORMAL HIGH (ref 15–41)
Albumin: 2 g/dL — ABNORMAL LOW (ref 3.5–5.0)
Alkaline Phosphatase: 43 U/L (ref 38–126)
Anion gap: 13 (ref 5–15)
BUN: 6 mg/dL — ABNORMAL LOW (ref 8–23)
CO2: 31 mmol/L (ref 22–32)
Calcium: 8 mg/dL — ABNORMAL LOW (ref 8.9–10.3)
Chloride: 88 mmol/L — ABNORMAL LOW (ref 98–111)
Creatinine, Ser: 0.55 mg/dL (ref 0.44–1.00)
GFR calc Af Amer: >60 mL/min (ref >60)
GFR calc non Af Amer: >60 mL/min (ref >60)
Glucose, Bld: 84 mg/dL (ref 70–99)
Potassium: 2.8 mmol/L — ABNORMAL LOW (ref 3.5–5.1)
Sodium: 132 mmol/L — ABNORMAL LOW (ref 135–145)
Total Bilirubin: 0.8 mg/dL (ref 0.3–1.2)
Total Protein: 5.7 g/dL — ABNORMAL LOW (ref 6.5–8.1)

## 2018-07-05 LAB — BASIC METABOLIC PANEL
Anion gap: 11 (ref 5–15)
BUN: 6 mg/dL — ABNORMAL LOW (ref 8–23)
CO2: 32 mmol/L (ref 22–32)
Calcium: 7.9 mg/dL — ABNORMAL LOW (ref 8.9–10.3)
Chloride: 91 mmol/L — ABNORMAL LOW (ref 98–111)
Creatinine, Ser: 0.6 mg/dL (ref 0.44–1.00)
GFR calc Af Amer: >60 mL/min (ref >60)
GFR calc non Af Amer: >60 mL/min (ref >60)
Glucose, Bld: 96 mg/dL (ref 70–99)
Potassium: 3.2 mmol/L — ABNORMAL LOW (ref 3.5–5.1)
Sodium: 134 mmol/L — ABNORMAL LOW (ref 135–145)

## 2018-07-05 LAB — CK: Total CK: 34 U/L — ABNORMAL LOW (ref 38–234)

## 2018-07-05 LAB — HEMOGLOBIN A1C
Hgb A1c MFr Bld: 6.7 % — ABNORMAL HIGH (ref 4.8–5.6)
Mean Plasma Glucose: 145.59 mg/dL

## 2018-07-05 LAB — TSH: TSH: 0.308 u[IU]/mL — ABNORMAL LOW (ref 0.350–4.500)

## 2018-07-05 LAB — MAGNESIUM: Magnesium: 1.8 mg/dL (ref 1.7–2.4)

## 2018-07-05 LAB — VITAMIN B12: Vitamin B-12: 178 pg/mL — ABNORMAL LOW (ref 180–914)

## 2018-07-05 LAB — LACTIC ACID, PLASMA: Lactic Acid, Venous: 1.7 mmol/L (ref 0.5–1.9)

## 2018-07-05 LAB — AMMONIA: Ammonia: 25 umol/L (ref 9–35)

## 2018-07-05 MED ORDER — METOPROLOL TARTRATE 5 MG/5ML IV SOLN
5.0000 mg | Freq: Four times a day (QID) | INTRAVENOUS | Status: DC
  Administered 2018-07-05 – 2018-07-06 (×4): 5 mg via INTRAVENOUS
  Filled 2018-07-05 (×3): qty 5

## 2018-07-05 MED ORDER — ONDANSETRON HCL 4 MG/2ML IJ SOLN
4.0000 mg | Freq: Four times a day (QID) | INTRAMUSCULAR | Status: AC
  Administered 2018-07-05 (×3): 4 mg via INTRAVENOUS
  Filled 2018-07-05 (×3): qty 2

## 2018-07-05 MED ORDER — PANTOPRAZOLE SODIUM 40 MG IV SOLR
40.0000 mg | INTRAVENOUS | Status: DC
  Administered 2018-07-05 – 2018-07-06 (×2): 40 mg via INTRAVENOUS
  Filled 2018-07-05: qty 40

## 2018-07-05 MED ORDER — SODIUM CHLORIDE 0.9 % IV SOLN
INTRAVENOUS | Status: AC
  Administered 2018-07-05: 16:00:00 via INTRAVENOUS

## 2018-07-05 NOTE — Plan of Care (Signed)
  Problem: Activity: Goal: Risk for activity intolerance will decrease Outcome: Progressing   Problem: Safety: Goal: Ability to remain free from injury will improve Outcome: Progressing   Problem: Activity: Goal: Capacity to carry out activities will improve Outcome: Progressing   

## 2018-07-05 NOTE — Progress Notes (Signed)
[facility] PROGRESS NOTE  Heather Galaslizabeth W Brabson WUJ:[id number] DOB: 04/22/1940 DOA: 07/04/2018 PCP: Patient, No Pcp Per  Assessment/Plan:  1. Generalized weakness; anorexia; loose stools; elevated LFT's  Presented with anorexia, loose stools, and progressive generalized weakness to the point that she has difficulty getting out of bed. This has been the case since discharged 6/14 after pancreatitis. Lipase, bilirubin, and transaminases all normalized by time of recent discharge but transaminases now slightly elevated again. She complains of food/drink tasting bad or having no taste. Smell of food brings waves of nausea but is requesting liquids. Keeping a low grad fever, WBC has decreased, blood cultures pending. Abdominal exam benign. Urinalysis neg uti. Chest xray without infiltrate. Of note RUQ US 6/8 consistent with cirrhosis. Has OP surgical consult scheduled for cholecystectomy and liver biopsy -Continue gentle IVF hydration, electrolyte replacement, symptom-control   -follow stool studine -scheduled zofran for 24 hours -clear liquid diet -monitor -if no improvement consider gen surg consult  2. Hypokalemia provided with IV potassium with minimal improvement. Provided with IV mag and more IV potassium. No events on tele -repeat this afternoon -monitor in am    3. Chronic diastolic CHF  Appears compensated. Developed acute CHF with IVF during recent admission - Continue gentle IVF hydration in setting of loose stools and anorexia - monitor daily wt and I/O's    4. COPD stable at baseline. No on home oxygen.  - Continue Incruse and as-needed albuterol    5. Type II DM  HgA1c 6.7. Managed at home with metformin and glipizide -continue to hold home oral agents  - Check CBG's and use a low-intensity SSI with Novolog for now    6. Hypertension  - Use IV labetalol as needed until she is consistently tolerating PO    7. B12 deficiency. Folate pending.   -supplement -monitor  Code Status: full Family Communication: husband on phone Disposition Plan: home when ready likely 48 hours   Consultants:    Procedures:    Antibiotics:    HPI/Subjective: 78 yo hx IBS, gerd, htn, dm, recent pancreatitis and elevated LFT recently discharged presented with persistent nausea/anorexia, intermittent diarrhea found to be dehydrated with electrolyte derangement. IV fluids and scheduled zofran started.   Objective: Vitals:   07/05/18 0816 07/05/18 0818  BP:    Pulse:    Resp:    Temp:    SpO2: 91% 93%    Intake/Output Summary (Last 24 hours) at 07/05/2018 1102 Last data filed at 07/05/2018 0850 Gross per 24 hour  Intake 486.48 ml  Output 600 ml  Net -113.52 ml   Filed Weights   07/04/18 1844 07/04/18 2325 07/05/18 0545  Weight: 85 kg 76.9 kg 77.1 kg    Exam:   General:  Awake alert face flushed, appears ill but not toxic  Cardiovascular: tachycardia regular no mgr no LE edema  Respiratory: normal effort BS clear bilaterally no wheeze  Abdomen: non-distended soft +BS mild diffuse tenderness to palpation no rebound  Musculoskeletal: joints without swelling /erythema   Data Reviewed: Basic Metabolic Panel: Recent Labs  Lab 07/04/18 1524 07/05/18 0130  NA 133* 132*  K 2.3* 2.8*  CL 87* 88*  CO2 32 31  GLUCOSE 89 84  BUN 6* 6*  CREATININE 0.65 0.55  CALCIUM 8.2* 8.0*  MG  --  1.8   Liver Function Tests: Recent Labs  Lab 07/04/18 1524 07/05/18 0130  AST 60* 43*  ALT 45* 38  ALKPHOS 54 43  BILITOT 1.1 0.8  PROT 6.1*  5.7*  ALBUMIN 2.1* 2.0*   Recent Labs  Lab 07/04/18 1524  LIPASE 23   Recent Labs  Lab 07/05/18 0130  AMMONIA 25   CBC: Recent Labs  Lab 07/04/18 1524 07/05/18 0130  WBC 10.6* 6.5  NEUTROABS 8.9* 5.0  HGB 11.7* 11.0*  HCT 35.7* 33.2*  MCV 87.1 86.5  PLT 301 213   Cardiac Enzymes: Recent Labs  Lab 07/05/18 0130  CKTOTAL 34*   BNP (last 3 results) No results for  input(s): BNP in the last 8760 hours.  ProBNP (last 3 results) No results for input(s): PROBNP in the last 8760 hours.  CBG: Recent Labs  Lab 07/05/18 0005 07/05/18 0601 07/05/18 0754  GLUCAP 103* 93 102*    Recent Results (from the past 240 hour(s))  Culture, blood (routine x 2)     Status: None (Preliminary result)   Collection Time: 07/04/18  5:30 PM   Specimen: BLOOD LEFT WRIST  Result Value Ref Range Status   Specimen Description BLOOD LEFT WRIST  Final   Special Requests   Final    BOTTLES DRAWN AEROBIC ONLY Blood Culture results may not be optimal due to an inadequate volume of blood received in culture bottles   Culture   Final    NO GROWTH < 24 HOURS Performed at [facility], [address] [place], [place] [address]    Report Status PENDING  Incomplete  Culture, blood (routine x 2)     Status: None (Preliminary result)   Collection Time: 07/04/18  5:40 PM   Specimen: BLOOD RIGHT HAND  Result Value Ref Range Status   Specimen Description BLOOD RIGHT HAND  Final   Special Requests   Final    BOTTLES DRAWN AEROBIC AND ANAEROBIC Blood Culture adequate volume   Culture   Final    NO GROWTH < 24 HOURS Performed at [facility], [address] [place], [place] [address]    Report Status PENDING  Incomplete  SARS Coronavirus 2 (CEPHEID - Performed in [facility] hospital lab), Hosp Order     Status: None   Collection Time: 07/04/18  6:47 PM   Specimen: Nasopharyngeal Swab  Result Value Ref Range Status   SARS Coronavirus 2 NEGATIVE NEGATIVE Final    Comment: (NOTE) If result is NEGATIVE SARS-CoV-2 target nucleic acids are NOT DETECTED. The SARS-CoV-2 RNA is generally detectable in upper and lower  respiratory specimens during the acute phase of infection. The lowest  concentration of SARS-CoV-2 viral copies this assay can detect is 250  copies / mL. A negative result does not preclude SARS-CoV-2 infection  and should not be used as the sole basis  for treatment or other  patient management decisions.  A negative result may occur with  improper specimen collection / handling, submission of specimen other  than nasopharyngeal swab, presence of viral mutation(s) within the  areas targeted by this assay, and inadequate number of viral copies  (<250 copies / mL). A negative result must be combined with clinical  observations, patient history, and epidemiological information. If result is POSITIVE SARS-CoV-2 target nucleic acids are DETECTED. The SARS-CoV-2 RNA is generally detectable in upper and lower  respiratory specimens dur ing the acute phase of infection.  Positive  results are indicative of active infection with SARS-CoV-2.  Clinical  correlation with patient history and other diagnostic information is  necessary to determine patient infection status.  Positive results do  not rule out bacterial infection or co-infection with  other viruses. If result is PRESUMPTIVE POSTIVE SARS-CoV-2 nucleic acids MAY BE PRESENT.   A presumptive positive result was obtained on the submitted specimen  and confirmed on repeat testing.  While 2019 novel coronavirus  (SARS-CoV-2) nucleic acids may be present in the submitted sample  additional confirmatory testing may be necessary for epidemiological  and / or clinical management purposes  to differentiate between  SARS-CoV-2 and other Sarbecovirus currently known to infect humans.  If clinically indicated additional testing with an alternate test  methodology [phone]) is advised. The SARS-CoV-2 RNA is generally  detectable in upper and lower respiratory sp ecimens during the acute  phase of infection. The expected result is Negative. Fact Sheet for Patients:  StrictlyIdeas.no Fact Sheet for Healthcare Providers: BankingDealers.co.za This test is not yet approved or cleared by the [place] FDA and has been authorized for detection and/or  diagnosis of SARS-CoV-2 by FDA under an Emergency Use Authorization (EUA).  This EUA will remain in effect (meaning this test can be used) for the duration of the COVID-19 declaration under Section 564(b)(1) of the Act, 21 U.S.C. section 360bbb-3(b)(1), unless the authorization is terminated or revoked sooner. Performed at [facility], [place] [address], [place], [place] [address]      Studies: Dg Chest 2 View  Result Date: 07/04/2018 CLINICAL DATA:  Weakness and decreased appetite. EXAM: CHEST - 2 VIEW COMPARISON:  Chest x-ray 06/14/2018 and chest CT 06/16/2018 FINDINGS: The heart is borderline enlarged but stable. There is mild tortuosity and calcification of the thoracic aorta. There are persistent small residual bilateral pleural effusions and bibasilar atelectasis. No edema or infiltrates and no worrisome pulmonary lesions. The bony thorax is intact. IMPRESSION: Small residual bilateral pleural effusions and bibasilar atelectasis but no definite infiltrates or edema. Electronically Signed   By: Marijo Sanes M.D.   On: 07/04/2018 16:56    Scheduled Meds: . enoxaparin (LOVENOX) injection  40 mg Subcutaneous Q24H  . insulin aspart  0-9 Units Subcutaneous Q4H  . metoprolol tartrate  5 mg Intravenous Q6H  . ondansetron (ZOFRAN) IV  4 mg Intravenous Q6H  . pantoprazole (PROTONIX) IV  40 mg Intravenous Q24H  . sodium chloride flush  3 mL Intravenous Q12H  . umeclidinium bromide  1 puff Inhalation Daily   Continuous Infusions: . famotidine (PEPCID) IV 20 mg (07/05/18 0947)    Principal Problem:   Abdominal pain, vomiting, and diarrhea Active Problems:   Elevated LFTs/Needs Liver Biopsy   Chronic diastolic CHF (congestive heart failure) (HCC)   Hyponatremia   Essential hypertension   Type 2 diabetes mellitus without complication (HCC)   COPD (chronic obstructive pulmonary disease) ([place])    Time spent: 16 miutes    [place] NP  [facility]  If 7PM-7AM,  please contact night-coverage at [email or web address], password [facility] 07/05/2018, 11:02 AM  LOS: 0 days

## 2018-07-05 NOTE — Progress Notes (Signed)
POA updated on patient condition. Marcille Blanco, RN

## 2018-07-06 ENCOUNTER — Ambulatory Visit: Payer: Self-pay | Admitting: General Surgery

## 2018-07-06 ENCOUNTER — Inpatient Hospital Stay (HOSPITAL_COMMUNITY): Payer: Medicare HMO

## 2018-07-06 DIAGNOSIS — R0902 Hypoxemia: Secondary | ICD-10-CM | POA: Diagnosis present

## 2018-07-06 LAB — CBC
HCT: 32.9 % — ABNORMAL LOW (ref 36.0–46.0)
Hemoglobin: 10.5 g/dL — ABNORMAL LOW (ref 12.0–15.0)
MCH: 28.2 pg (ref 26.0–34.0)
MCHC: 31.9 g/dL (ref 30.0–36.0)
MCV: 88.4 fL (ref 80.0–100.0)
Platelets: 197 10*3/uL (ref 150–400)
RBC: 3.72 MIL/uL — ABNORMAL LOW (ref 3.87–5.11)
RDW: 14.3 % (ref 11.5–15.5)
WBC: 5.9 10*3/uL (ref 4.0–10.5)
nRBC: 0 % (ref 0.0–0.2)

## 2018-07-06 LAB — BASIC METABOLIC PANEL
Anion gap: 10 (ref 5–15)
BUN: 5 mg/dL — ABNORMAL LOW (ref 8–23)
CO2: 33 mmol/L — ABNORMAL HIGH (ref 22–32)
Calcium: 8 mg/dL — ABNORMAL LOW (ref 8.9–10.3)
Chloride: 93 mmol/L — ABNORMAL LOW (ref 98–111)
Creatinine, Ser: 0.56 mg/dL (ref 0.44–1.00)
GFR calc Af Amer: >60 mL/min (ref >60)
GFR calc non Af Amer: >60 mL/min (ref >60)
Glucose, Bld: 109 mg/dL — ABNORMAL HIGH (ref 70–99)
Potassium: 3.4 mmol/L — ABNORMAL LOW (ref 3.5–5.1)
Sodium: 136 mmol/L (ref 135–145)

## 2018-07-06 LAB — FOLATE RBC
Folate, Hemolysate: 396 ng/mL
Folate, RBC: 1204 ng/mL (ref >498)
Hematocrit: 32.9 % — ABNORMAL LOW (ref 34.0–46.6)

## 2018-07-06 LAB — GLUCOSE, CAPILLARY
Glucose-Capillary: 104 mg/dL — ABNORMAL HIGH (ref 70–99)
Glucose-Capillary: 115 mg/dL — ABNORMAL HIGH (ref 70–99)
Glucose-Capillary: 119 mg/dL — ABNORMAL HIGH (ref 70–99)
Glucose-Capillary: 151 mg/dL — ABNORMAL HIGH (ref 70–99)

## 2018-07-06 MED ORDER — METOPROLOL SUCCINATE ER 100 MG PO TB24
100.0000 mg | ORAL_TABLET | Freq: Every day | ORAL | Status: DC
  Administered 2018-07-06 – 2018-07-10 (×5): 100 mg via ORAL
  Filled 2018-07-06 (×5): qty 1

## 2018-07-06 MED ORDER — ONDANSETRON HCL 4 MG/2ML IJ SOLN
4.0000 mg | Freq: Four times a day (QID) | INTRAMUSCULAR | Status: DC
  Administered 2018-07-06 – 2018-07-07 (×3): 4 mg via INTRAVENOUS
  Filled 2018-07-06 (×3): qty 2

## 2018-07-06 MED ORDER — IOHEXOL 300 MG/ML  SOLN
100.0000 mL | Freq: Once | INTRAMUSCULAR | Status: AC | PRN
  Administered 2018-07-06: 100 mL via INTRAVENOUS

## 2018-07-06 MED ORDER — PIPERACILLIN-TAZOBACTAM 3.375 G IVPB
3.3750 g | Freq: Three times a day (TID) | INTRAVENOUS | Status: DC
  Administered 2018-07-06 – 2018-07-07 (×2): 3.375 g via INTRAVENOUS
  Filled 2018-07-06 (×3): qty 50

## 2018-07-06 MED ORDER — AMLODIPINE BESYLATE 10 MG PO TABS
10.0000 mg | ORAL_TABLET | Freq: Every day | ORAL | Status: DC
  Administered 2018-07-06 – 2018-07-10 (×5): 10 mg via ORAL
  Filled 2018-07-06 (×5): qty 1

## 2018-07-06 NOTE — Progress Notes (Signed)
Called radiology and asked when patient is going to have CT done since she has been NPO since this morning. They will send for transport now.

## 2018-07-06 NOTE — Progress Notes (Signed)
Patient asking to eat. Paged MD to change diet from NPO.

## 2018-07-06 NOTE — Progress Notes (Signed)
Asked who to call and notify family. Per patient she is ok, she wants MD to call family not the nurse.

## 2018-07-06 NOTE — Progress Notes (Signed)
[facility] PROGRESS NOTE  Heather Oconnor ZOX:[id number] DOB: 08/19/1940 DOA: 07/04/2018 PCP: Patient, No Pcp Per  Assessment/Plan: 1.Generalized weakness; anorexia; loose stools; elevated LFT's able to tolerated clear liquids with scheduled zofran. No further loose stool. Small formed BM this am. Max temp 102 last night,  WBC within limits of normal, blood cultures no growth 2 days. LFTs trending down.   Abdominal exam benign. Urinalysis neg uti. Chest xray without infiltrate. Of note RUQ US 6/8 consistent with cirrhosis. Has OP surgical consult scheduled for cholecystectomy and liver biopsy -CT abdomen/pelvis rule out abscess/pseuodocyst -Continue gentle IVF hydration, electrolyte replacement, -symptom-control -follow stool studine - continue scheduled zofran for 24 hours -full  liquid diet -monitor -if no improvement consider gen surg consult  2.Hypokalemia. Potassium 3.4. Provided with IV mag and IV potassium. No events on tele -monitor in am  3.Chronic diastolic CHFremains compensated.Developed acute CHF with IVF during recent admission -Continue gentle IVF hydration anorexia - monitor daily wt and I/O's  4.COPDstable at baseline. No on home oxygen. -Continue Incruse and as-needed albuterol  5.Type II DMHgA1c 6.7.Managed at home with metformin and glipizide. Fair control -continue to hold home oral agents -Check CBG's and use a low-intensity SSI with Novolog for now  6.HypertensionBP high end of normal with scheduled IV metoprolol.  -will resume home metoprolol po since tolerating clear liquid -will resume home norvasc as well -monitor  7. Fever. See #1.  -CT abdomen   Code Status: full Family Communication: husband on phone Disposition Plan: home when ready   Consultants:    Procedures:    Antibiotics:    HPI/Subjective: 78 yo admitted with persistent fever, generalized weakness, elevated LFT. Recent  hospitalization for pancreatitis with OP f/u planned for cholesystectomy an liver biopsy.   Objective: Vitals:   07/06/18 0845 07/06/18 0901  BP: (!) 162/76   Pulse: 96   Resp: 18   Temp:    SpO2:  92%    Intake/Output Summary (Last 24 hours) at 07/06/2018 0957 Last data filed at 07/06/2018 0403 Gross per 24 hour  Intake -  Output 400 ml  Net -400 ml   Filed Weights   07/04/18 2325 07/05/18 0545 07/06/18 0553  Weight: 76.9 kg 77.1 kg 77.8 kg    Exam:   General:  Awake alert no acute distress  Cardiovascular: rrr no mgr no LE edema  Respiratory: normal effort BS slight distant but clear bilaterally no wheeze  Abdomen: obese soft +BS mild diffuse tenderness to palpation  Musculoskeletal: joints without swelling/erythema   Data Reviewed: Basic Metabolic Panel: Recent Labs  Lab 07/04/18 1524 07/05/18 0130 07/05/18 1126 07/06/18 0342  NA 133* 132* 134* 136  K 2.3* 2.8* 3.2* 3.4*  CL 87* 88* 91* 93*  CO2 32 31 32 33*  GLUCOSE 89 84 96 109*  BUN 6* 6* 6* 5*  CREATININE 0.65 0.55 0.60 0.56  CALCIUM 8.2* 8.0* 7.9* 8.0*  MG  --  1.8  --   --    Liver Function Tests: Recent Labs  Lab 07/04/18 1524 07/05/18 0130  AST 60* 43*  ALT 45* 38  ALKPHOS 54 43  BILITOT 1.1 0.8  PROT 6.1* 5.7*  ALBUMIN 2.1* 2.0*   Recent Labs  Lab 07/04/18 1524  LIPASE 23   Recent Labs  Lab 07/05/18 0130  AMMONIA 25   CBC: Recent Labs  Lab 07/04/18 1524 07/05/18 0130 07/06/18 0342  WBC 10.6* 6.5 5.9  NEUTROABS 8.9* 5.0  --   HGB 11.7* 11.0* 10.5*  HCT 35.7* 33.2* 32.9*  MCV 87.1 86.5 88.4  PLT 301 213 197   Cardiac Enzymes: Recent Labs  Lab 07/05/18 0130  CKTOTAL 34*   BNP (last 3 results) No results for input(s): BNP in the last 8760 hours.  ProBNP (last 3 results) No results for input(s): PROBNP in the last 8760 hours.  CBG: Recent Labs  Lab 07/05/18 1217 07/05/18 1610 07/05/18 2006 07/06/18 0006 07/06/18 0801  GLUCAP 100* 108* 118* 104* 151*     Recent Results (from the past 240 hour(s))  Culture, blood (routine x 2)     Status: None (Preliminary result)   Collection Time: 07/04/18  5:30 PM   Specimen: BLOOD LEFT WRIST  Result Value Ref Range Status   Specimen Description BLOOD LEFT WRIST  Final   Special Requests   Final    BOTTLES DRAWN AEROBIC ONLY Blood Culture results may not be optimal due to an inadequate volume of blood received in culture bottles   Culture   Final    NO GROWTH 2 DAYS Performed at [facility], [address] [place], [place] [address]    Report Status PENDING  Incomplete  Culture, blood (routine x 2)     Status: None (Preliminary result)   Collection Time: 07/04/18  5:40 PM   Specimen: BLOOD RIGHT HAND  Result Value Ref Range Status   Specimen Description BLOOD RIGHT HAND  Final   Special Requests   Final    BOTTLES DRAWN AEROBIC AND ANAEROBIC Blood Culture adequate volume   Culture   Final    NO GROWTH 2 DAYS Performed at [facility], [address] [place], [place] [address]    Report Status PENDING  Incomplete  SARS Coronavirus 2 (CEPHEID - Performed in [facility] hospital lab), Hosp Order     Status: None   Collection Time: 07/04/18  6:47 PM   Specimen: Nasopharyngeal Swab  Result Value Ref Range Status   SARS Coronavirus 2 NEGATIVE NEGATIVE Final    Comment: (NOTE) If result is NEGATIVE SARS-CoV-2 target nucleic acids are NOT DETECTED. The SARS-CoV-2 RNA is generally detectable in upper and lower  respiratory specimens during the acute phase of infection. The lowest  concentration of SARS-CoV-2 viral copies this assay can detect is 250  copies / mL. A negative result does not preclude SARS-CoV-2 infection  and should not be used as the sole basis for treatment or other  patient management decisions.  A negative result may occur with  improper specimen collection / handling, submission of specimen other  than nasopharyngeal swab, presence of viral mutation(s) within  the  areas targeted by this assay, and inadequate number of viral copies  (<250 copies / mL). A negative result must be combined with clinical  observations, patient history, and epidemiological information. If result is POSITIVE SARS-CoV-2 target nucleic acids are DETECTED. The SARS-CoV-2 RNA is generally detectable in upper and lower  respiratory specimens dur ing the acute phase of infection.  Positive  results are indicative of active infection with SARS-CoV-2.  Clinical  correlation with patient history and other diagnostic information is  necessary to determine patient infection status.  Positive results do  not rule out bacterial infection or co-infection with other viruses. If result is PRESUMPTIVE POSTIVE SARS-CoV-2 nucleic acids MAY BE PRESENT.   A presumptive positive result was obtained on the submitted specimen  and confirmed on repeat testing.  While 2019 novel coronavirus  (SARS-CoV-2) nucleic acids may be present in  the submitted sample  additional confirmatory testing may be necessary for epidemiological  and / or clinical management purposes  to differentiate between  SARS-CoV-2 and other Sarbecovirus currently known to infect humans.  If clinically indicated additional testing with an alternate test  methodology [phone]) is advised. The SARS-CoV-2 RNA is generally  detectable in upper and lower respiratory sp ecimens during the acute  phase of infection. The expected result is Negative. Fact Sheet for Patients:  StrictlyIdeas.no Fact Sheet for Healthcare Providers: BankingDealers.co.za This test is not yet approved or cleared by the [place] FDA and has been authorized for detection and/or diagnosis of SARS-CoV-2 by FDA under an Emergency Use Authorization (EUA).  This EUA will remain in effect (meaning this test can be used) for the duration of the COVID-19 declaration under Section 564(b)(1) of the Act, 21  U.S.C. section 360bbb-3(b)(1), unless the authorization is terminated or revoked sooner. Performed at [facility], [place] [address], [place], [place] [address]      Studies: Dg Chest 2 View  Result Date: 07/04/2018 CLINICAL DATA:  Weakness and decreased appetite. EXAM: CHEST - 2 VIEW COMPARISON:  Chest x-ray 06/14/2018 and chest CT 06/16/2018 FINDINGS: The heart is borderline enlarged but stable. There is mild tortuosity and calcification of the thoracic aorta. There are persistent small residual bilateral pleural effusions and bibasilar atelectasis. No edema or infiltrates and no worrisome pulmonary lesions. The bony thorax is intact. IMPRESSION: Small residual bilateral pleural effusions and bibasilar atelectasis but no definite infiltrates or edema. Electronically Signed   By: Marijo Sanes M.D.   On: 07/04/2018 16:56    Scheduled Meds: . enoxaparin (LOVENOX) injection  40 mg Subcutaneous Q24H  . insulin aspart  0-9 Units Subcutaneous Q4H  . metoprolol tartrate  5 mg Intravenous Q6H  . ondansetron (ZOFRAN) IV  4 mg Intravenous Q6H  . pantoprazole (PROTONIX) IV  40 mg Intravenous Q24H  . sodium chloride flush  3 mL Intravenous Q12H  . umeclidinium bromide  1 puff Inhalation Daily   Continuous Infusions: . famotidine (PEPCID) IV 20 mg (07/06/18 0841)    Principal Problem:   Abdominal pain, vomiting, and diarrhea Active Problems:   Elevated LFTs/Needs Liver Biopsy   Chronic diastolic CHF (congestive heart failure) (HCC)   Hyponatremia   Hypoxia   Essential hypertension   Type 2 diabetes mellitus without complication (HCC)   COPD (chronic obstructive pulmonary disease) (HCC)   B12 deficiency   Abdominal pain    Time spent: 40 minute    [facility] M NP  [facility]  If 7PM-7AM, please contact night-coverage at [email or web address], password [facility] 07/06/2018, 9:57 AM  LOS: 1 day

## 2018-07-06 NOTE — Progress Notes (Signed)
New order placed, Radiology called right away about CT of abdomen, patient already had sips of water and clear liquid drinks. Radiology informed that they will come and pick up patients 4 hrs after being NPO, Patient informed about NPO status

## 2018-07-06 NOTE — Progress Notes (Signed)
MD  Eliseo Squires gave verbal order for clear liquid diet. Order placed

## 2018-07-06 NOTE — Progress Notes (Signed)
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 WIll get GI consult in AM and start abx (abx may not help).  Already on pepcid/protonix. Heather Oconnor

## 2018-07-06 NOTE — Progress Notes (Signed)
Patient anxious about procedure, called CT again. They are busy, they will get ger as soon as possible.

## 2018-07-06 NOTE — Progress Notes (Signed)
Patient had BM that was mixed accidentally with urine. No able to send stool sample this time.

## 2018-07-07 DIAGNOSIS — R1010 Upper abdominal pain, unspecified: Secondary | ICD-10-CM

## 2018-07-07 DIAGNOSIS — R630 Anorexia: Secondary | ICD-10-CM

## 2018-07-07 DIAGNOSIS — E43 Unspecified severe protein-calorie malnutrition: Secondary | ICD-10-CM

## 2018-07-07 DIAGNOSIS — E871 Hypo-osmolality and hyponatremia: Secondary | ICD-10-CM

## 2018-07-07 DIAGNOSIS — R634 Abnormal weight loss: Secondary | ICD-10-CM

## 2018-07-07 DIAGNOSIS — K863 Pseudocyst of pancreas: Principal | ICD-10-CM

## 2018-07-07 DIAGNOSIS — K8511 Biliary acute pancreatitis with uninfected necrosis: Secondary | ICD-10-CM

## 2018-07-07 DIAGNOSIS — R509 Fever, unspecified: Secondary | ICD-10-CM

## 2018-07-07 LAB — COMPREHENSIVE METABOLIC PANEL
ALT: 46 U/L — ABNORMAL HIGH (ref 0–44)
AST: 60 U/L — ABNORMAL HIGH (ref 15–41)
Albumin: 1.9 g/dL — ABNORMAL LOW (ref 3.5–5.0)
Alkaline Phosphatase: 50 U/L (ref 38–126)
Anion gap: 12 (ref 5–15)
BUN: 5 mg/dL — ABNORMAL LOW (ref 8–23)
CO2: 31 mmol/L (ref 22–32)
Calcium: 8.3 mg/dL — ABNORMAL LOW (ref 8.9–10.3)
Chloride: 92 mmol/L — ABNORMAL LOW (ref 98–111)
Creatinine, Ser: 0.54 mg/dL (ref 0.44–1.00)
GFR calc Af Amer: >60 mL/min (ref >60)
GFR calc non Af Amer: >60 mL/min (ref >60)
Glucose, Bld: 120 mg/dL — ABNORMAL HIGH (ref 70–99)
Potassium: 3.4 mmol/L — ABNORMAL LOW (ref 3.5–5.1)
Sodium: 135 mmol/L (ref 135–145)
Total Bilirubin: 0.8 mg/dL (ref 0.3–1.2)
Total Protein: 5.6 g/dL — ABNORMAL LOW (ref 6.5–8.1)

## 2018-07-07 LAB — GLUCOSE, CAPILLARY
Glucose-Capillary: 132 mg/dL — ABNORMAL HIGH (ref 70–99)
Glucose-Capillary: 140 mg/dL — ABNORMAL HIGH (ref 70–99)
Glucose-Capillary: 174 mg/dL — ABNORMAL HIGH (ref 70–99)
Glucose-Capillary: 218 mg/dL — ABNORMAL HIGH (ref 70–99)
Glucose-Capillary: 110 mg/dL — ABNORMAL HIGH (ref 70–99)
Glucose-Capillary: 130 mg/dL — ABNORMAL HIGH (ref 70–99)
Glucose-Capillary: 185 mg/dL — ABNORMAL HIGH (ref 70–99)

## 2018-07-07 LAB — CBC
HCT: 30.8 % — ABNORMAL LOW (ref 36.0–46.0)
Hemoglobin: 10 g/dL — ABNORMAL LOW (ref 12.0–15.0)
MCH: 29 pg (ref 26.0–34.0)
MCHC: 32.5 g/dL (ref 30.0–36.0)
MCV: 89.3 fL (ref 80.0–100.0)
Platelets: 214 10*3/uL (ref 150–400)
RBC: 3.45 MIL/uL — ABNORMAL LOW (ref 3.87–5.11)
RDW: 14.2 % (ref 11.5–15.5)
WBC: 6.6 10*3/uL (ref 4.0–10.5)
nRBC: 0 % (ref 0.0–0.2)

## 2018-07-07 LAB — PHOSPHORUS: Phosphorus: 3.1 mg/dL (ref 2.5–4.6)

## 2018-07-07 LAB — PREALBUMIN: Prealbumin: 5.4 mg/dL — ABNORMAL LOW (ref 18–38)

## 2018-07-07 LAB — MAGNESIUM: Magnesium: 1.9 mg/dL (ref 1.7–2.4)

## 2018-07-07 MED ORDER — MAGNESIUM SULFATE IN D5W 1-5 GM/100ML-% IV SOLN
1.0000 g | Freq: Once | INTRAVENOUS | Status: AC
  Administered 2018-07-07: 1 g via INTRAVENOUS
  Filled 2018-07-07: qty 100

## 2018-07-07 MED ORDER — DORZOLAMIDE HCL-TIMOLOL MAL 2-0.5 % OP SOLN
1.0000 [drp] | Freq: Every day | OPHTHALMIC | Status: DC
  Administered 2018-07-07 – 2018-07-14 (×8): 1 [drp] via OPHTHALMIC
  Filled 2018-07-07: qty 10

## 2018-07-07 MED ORDER — BOOST / RESOURCE BREEZE PO LIQD CUSTOM
1.0000 | Freq: Three times a day (TID) | ORAL | Status: DC
  Administered 2018-07-07 – 2018-07-09 (×7): 1 via ORAL

## 2018-07-07 MED ORDER — SODIUM CHLORIDE 0.9 % IV SOLN
INTRAVENOUS | Status: DC | PRN
  Administered 2018-07-07 (×2): 250 mL via INTRAVENOUS

## 2018-07-07 MED ORDER — LATANOPROST 0.005 % OP SOLN
1.0000 [drp] | Freq: Every day | OPHTHALMIC | Status: DC
  Administered 2018-07-07 – 2018-07-13 (×7): 1 [drp] via OPHTHALMIC
  Filled 2018-07-07: qty 2.5

## 2018-07-07 MED ORDER — PANTOPRAZOLE SODIUM 40 MG PO TBEC
40.0000 mg | DELAYED_RELEASE_TABLET | Freq: Every day | ORAL | Status: DC
  Administered 2018-07-07 – 2018-07-10 (×4): 40 mg via ORAL
  Filled 2018-07-07 (×4): qty 1

## 2018-07-07 NOTE — Consult Note (Addendum)
Consultation  Referring Provider: [facility]/Dr Vann Primary Care Physician:  Patient, No Pcp Per Primary Gastroenterologist:  Dr.Rourk  Reason for Consultation:  Pancreatic pseudocyts  HPI: Heather Oconnor is a 78 y.o. female, known to Dr. Gala Romney, and recently hospitalized at [facility] 6/8 through 06/18/2018 with an acute episode of pancreatitis.  Patient has no prior history of pancreatitis.  She did have an initial transaminitis, no gallstones or sludge found on imaging and no ductal dilation..  Lipid panel was done and unremarkable.  Etiology of pancreatitis was felt possibly secondary to microlithiasis and plan was for outpatient evaluation with surgery for possible laparoscopic cholecystectomy. Patient says that she was feeling better when she left the hospital but shortly thereafter noted feeling progressively more weak.  She got to the point that she was unable to ambulate on her own.  She had also developed some mild shortness of breath.  She denies any ongoing abdominal pain, and no nausea or vomiting.  No dysphagia or odynophagia, no heartburn.  She says she has absolutely no appetite and the smell of solid food was very unappealing.  She was trying to push clear liquids at home and says she was doing pretty well with liquids.  She had apparently had some diarrhea on admission but that resolved and stool studies have been unable to be collected. On admission lactate was elevated at 2, potassium 2.3, creatinine 0.65, T bili 1.1/alk phos 54/ALT 45/AST of 60 and lipase normal at 23.  Repeat CT imaging was done on 07/06/2018 because of persistent lack of appetite etc.  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  She has 3 fairly large collections, the largest measuring 9.1 x 5.3 cm superior to the pancreas or Stomach appears normal but  nondilated.  Patient has adult onset diabetes mellitus, hypertension, chronic congestive heart failure compensated.  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 Authorizing Provider  acetaminophen (TYLENOL) 500 MG tablet Take 2 tablets (1,000 mg total) by mouth every 6 (six) hours as needed. 06/18/18  Yes Emokpae, Courage, MD  albuterol (VENTOLIN HFA) 108 (90 Base) MCG/ACT inhaler Inhale 2 puffs into the lungs every 6 (six) hours as needed for wheezing or shortness of breath. 06/18/18  Yes Emokpae, Courage, MD  amLODipine (NORVASC) 10 MG tablet Take 1 tablet (10 mg total) by mouth daily. 06/18/18  Yes Roxan Hockey, MD  aspirin EC 81 MG tablet Take 1 tablet (81 mg total) by mouth daily with breakfast. 06/18/18 06/18/19 Yes Emokpae, Courage, MD  clopidogrel (PLAVIX) 75 MG tablet Take 75 mg by mouth daily.  07/11/14  Yes [provider]  DORZOLAMIDE HCL-TIMOLOL MAL OP Place 1 drop into the right eye daily.    Yes [provider]  furosemide (LASIX) 40 MG tablet Take 1 tablet (40 mg total) by mouth daily. 06/18/18 06/18/19 Yes Emokpae, Courage, MD  glipiZIDE (GLUCOTROL XL) 5 MG  24 hr tablet Take 1 tablet (5 mg total) by mouth daily. 06/20/18  Yes Emokpae, Courage, MD  hydrALAZINE (APRESOLINE) 50 MG tablet Take 1 tablet (50 mg total) by mouth 3 (three) times daily. 06/18/18  Yes Emokpae, Courage, MD  latanoprost (XALATAN) 0.005 % ophthalmic solution Place 1 drop into the right eye at bedtime.  10/24/17 10/24/18 Yes [provider]  lisinopril (ZESTRIL) 20 MG tablet Take 1 tablet (20 mg total) by mouth daily. 06/18/18 06/18/19 Yes Emokpae, Courage, MD  loperamide (IMODIUM) 2 MG capsule Take 2 mg by mouth daily.    Yes [provider]  metFORMIN (GLUCOPHAGE) 500 MG tablet Take 1 tablet (500 mg total) by mouth 2 (two) times daily with a meal. 06/18/18  Yes Emokpae, Courage, MD  ondansetron (ZOFRAN) 4 MG tablet Take 1 tablet (4 mg total) by mouth every 6 (six) hours as needed for nausea. 06/18/18  Yes Emokpae, Courage, MD  pantoprazole (PROTONIX) 40 MG tablet Take 1 tablet (40 mg total) by mouth daily. 06/19/18  Yes Emokpae, Courage, MD  umeclidinium bromide (INCRUSE ELLIPTA) 62.5 MCG/INH AEPB Inhale 1 puff into the lungs daily. Dx- J44.1 06/19/18  Yes Roxan Hockey, MD  isosorbide mononitrate (IMDUR) 30 MG 24 hr tablet Take 1 tablet (30 mg total) by mouth daily. 06/19/18   Roxan Hockey, MD  metoprolol succinate (TOPROL-XL) 100 MG 24 hr tablet Take 1 tablet (100 mg total) by mouth daily. Patient not taking: Reported on 07/06/2018 06/18/18   Roxan Hockey, MD  oxyCODONE (OXY IR/ROXICODONE) 5 MG immediate release tablet Take 1 tablet (5 mg total) by mouth every 4 (four) hours as needed for severe pain. Patient not taking: Reported on 07/06/2018 06/20/18   Virl Cagey, MD    Current Facility-Administered Medications  Medication Dose Route Frequency Provider Last Rate Last Dose   0.9 %  sodium chloride infusion   Intravenous PRN Geradine Girt, DO 10 mL/hr at 07/07/18 0829 250 mL at 07/07/18 1027   acetaminophen (TYLENOL) tablet 650 mg  650 mg Oral Q6H PRN Vianne Bulls, MD   650 mg at 07/06/18 2144   Or   acetaminophen (TYLENOL) suppository 650 mg  650 mg Rectal Q6H PRN Opyd, Ilene Qua, MD       albuterol (PROVENTIL) (2.5 MG/3ML) 0.083% nebulizer solution 3 mL  3 mL Inhalation Q6H PRN Opyd, Ilene Qua, MD       amLODipine (NORVASC) tablet 10 mg  10 mg Oral Daily Radene Gunning, NP   10 mg at 07/07/18 2536     dorzolamide-timolol (COSOPT) 22.3-6.8 MG/ML ophthalmic solution 1 drop  1 drop Right Eye Daily Eulogio Bear U, DO   1 drop at 07/07/18 0823   enoxaparin (LOVENOX) injection 40 mg  40 mg Subcutaneous Q24H Opyd, Ilene Qua, MD   40 mg at 07/07/18 6440   famotidine (PEPCID) IVPB 20 mg premix  20 mg Intravenous Q12H Opyd, Ilene Qua, MD 100 mL/hr at 07/06/18 2205 20 mg at 07/06/18 2205   insulin aspart (novoLOG) injection 0-9 Units  0-9 Units Subcutaneous Q4H Opyd, Ilene Qua, MD   3 Units at 07/07/18 0834   latanoprost (XALATAN) 0.005 % ophthalmic solution 1 drop  1 drop Right Eye QHS Vann, Jessica U, DO       metoprolol succinate (TOPROL-XL) 24 hr tablet 100 mg  100 mg Oral Daily Radene Gunning, NP   100 mg at 07/07/18 3474   pantoprazole (PROTONIX) injection 40 mg  40 mg Intravenous  Q24H Radene Gunning, NP   40 mg at 07/06/18 1226   piperacillin-tazobactam (ZOSYN) IVPB 3.375 g  3.375 g Intravenous Q8H Vann, Jessica U, DO 12.5 mL/hr at 07/07/18 0830 3.375 g at 07/07/18 0830   sodium chloride flush (NS) 0.9 % injection 3 mL  3 mL Intravenous Q12H Opyd, Ilene Qua, MD   3 mL at 07/07/18 9470   umeclidinium bromide (INCRUSE ELLIPTA) 62.5 MCG/INH 1 puff  1 puff Inhalation Daily Opyd, Ilene Qua, MD   1 puff at 07/07/18 0905    Allergies as of 07/04/2018   (No Known Allergies)    Family History  Problem Relation Age of Onset   Colon cancer Neg Hx     Social History   Socioeconomic History   Marital status: Married    Spouse name: Not on file   Number of children: Not on file   Years of education: Not on file   Highest education level: Not on file  Occupational History   Not on file  Social Needs   Financial resource strain: Not on file   Food insecurity    Worry: Not on file    Inability: Not on file   Transportation needs    Medical: Not on file    Non-medical: Not on file  Tobacco Use   Smoking status: Former Smoker    Years: 9.00    Quit date: 11/27/2006     Years since quitting: 11.6   Smokeless tobacco: Never Used  Substance and Sexual Activity   Alcohol use: No    Alcohol/week: 0.0 standard drinks   Drug use: No   Sexual activity: Not on file  Lifestyle   Physical activity    Days per week: Not on file    Minutes per session: Not on file   Stress: Not on file  Relationships   Social connections    Talks on phone: Not on file    Gets together: Not on file    Attends religious service: Not on file    Active member of club or organization: Not on file    Attends meetings of clubs or organizations: Not on file    Relationship status: Not on file   Intimate partner violence    Fear of current or ex partner: Not on file    Emotionally abused: Not on file    Physically abused: Not on file    Forced sexual activity: Not on file  Other Topics Concern   Not on file  Social History Narrative   Not on file    Review of Systems: Pertinent positive and negative review of systems were noted in the above HPI section.  All other review of systems was otherwise negative.  Physical Exam: Vital signs in last 24 hours: Temp:  [97.8 F (36.6 C)-100.1 F (37.8 C)] 99.1 F (37.3 C) (07/03 0428) Pulse Rate:  [71-96] 71 (07/03 0905) Resp:  [18-20] 18 (07/03 0905) BP: (133-167)/(50-74) 147/60 (07/03 0820) SpO2:  [86 %-98 %] 95 % (07/03 0905) Weight:  [76.2 kg] 76.2 kg (07/03 0627) Last BM Date: 07/06/18 General:   Alert,  Well-developed, elderly, ill-appearing well-nourished, pleasant and cooperative in NAD, on O2 at 2 L Head:  Normocephalic and atraumatic. Eyes:  Sclera clear, no icterus.   Conjunctiva pink. Ears:  Normal auditory acuity. Nose:  No deformity, discharge,  or lesions. Mouth:  No deformity or lesions.   Neck:  Supple; no masses or thyromegaly. Lungs:  Clear throughout to auscultation.  No wheezes, crackles, or rhonchi . Heart:  Regular rate and rhythm; no murmurs, clicks, rubs,  or gallops. Abdomen:  Soft, obese,  bowel sounds are present, with upper abdominal fullness , she has minimal tenderness to palpation across the upper abdomen Rectal:  Deferred  Msk:  Symmetrical without gross deformities. . Pulses:  Normal pulses noted. Extremities:  Without clubbing or edema. Neurologic:  Alert and  oriented x4;  grossly normal neurologically. Skin:  Intact without significant lesions or rashes.. Psych:  Alert and cooperative. Normal mood and affect.  Intake/Output from previous day: 07/02 0701 - 07/03 0700 In: 590 [P.O.:540; IV Piggyback:50] Out: 950 [Urine:950] Intake/Output this shift: Total I/O In: 120 [P.O.:120] Out: -   Lab Results: Recent Labs    07/04/18 1524 07/05/18 0130 07/06/18 0342  WBC 10.6* 6.5 5.9  HGB 11.7* 11.0* 10.5*  HCT 35.7* 33.2*   32.9* 32.9*  PLT 301 213 197   BMET Recent Labs    07/05/18 0130 07/05/18 1126 07/06/18 0342  NA 132* 134* 136  K 2.8* 3.2* 3.4*  CL 88* 91* 93*  CO2 31 32 33*  GLUCOSE 84 96 109*  BUN 6* 6* 5*  CREATININE 0.55 0.60 0.56  CALCIUM 8.0* 7.9* 8.0*   LFT Recent Labs    07/05/18 0130  PROT 5.7*  ALBUMIN 2.0*  AST 43*  ALT 38  ALKPHOS 43  BILITOT 0.8   PT/INR No results for input(s): LABPROT, INR in the last 72 hours. Hepatitis Panel No results for input(s): HEPBSAG, HCVAB, HEPAIGM, HEPBIGM in the last 72 hours.  Pre-albumin 5.4  IMPRESSION:  #34 78 year old white female with episode of acute pancreatitis for which she was hospitalized 6/8 through 06/18/2018 at [facility].  Etiology of pancreatitis was not entirely clear but felt most likely secondary to microlithiasis, and plan was for outpatient surgical evaluation. Patient readmitted here on 07/04/2018 with progressive weakness and failure to thrive with poor oral intake due to complete lack of appetite and mild shortness of breath.  Repeat CT yesterday does not show  any significant pancreatic inflammation, no gallstones, she does have multiple fluid collections distant  with developing pseudocyst, the largest is 9.1 x 5.3 cm superior to the pancreas.  By report does not have any compression of the stomach  #2 anorexia-secondary to acute illness #3 severe hypokalemia-correcting #4 mild anemia #5 small bilateral effusions #6 underlying CHF #7 COPD #8 adult onset diabetes mellitus Upper abdominal pain Weight loss  Plan; Will advance diet to full liquids, to encourage increased caloric intake.  She was also agreeable to trying resource breeze supplement between meals. Check prealbumin Patient is on IV Protonix, will DC IV Pepcid She does not have any evidence of infected pseudocyst, Zosyn was started, believe this can be discontinued The pseudocysts, will need to be followed, are not mature, and by CT report are not causing compression of the stomach or duodenum.  Thank you, we will follow with you   Nicoletta Ba, PA _______________________________________________________________ I have reviewed the entire case in detail with the above APP and discussed the plan in detail.  Therefore, I agree with the diagnoses recorded above. In addition,  I have personally interviewed and examined the patient and have personally reviewed any abdominal/pelvic CT scan images.  My additional thoughts are as follows:  Recent episode of acute pancreatitis that was suspected to be biliary in origin, persistent upper abdominal pain nausea, anorexia and weight loss. While not septic or toxic appearing, she looks very unwell.  She is quite weak, mentation is sluggish, and it sounds like she has steadily been worsening recently.  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  These are causing extrinsic compression on the stomach, though not gastric outlet obstruction or proximal duodenal obstruction.  Fortunately, there is also no biliary obstruction.  Nevertheless, this is  causing severe symptoms leading to severe protein calorie malnutrition.  Of note, on exam today, the left side of her face and neck are erythematous and warm, not tender.  She thinks maybe this was going on at home and her family noticed it.  It is of unknown cause, but I will bring it to the attention of the hospitalist.  It would seem an unusual reaction from antibiotics, but we are discontinuing her Zosyn anyway because I do not think her fluid collections are infected.  This patient's biggest challenge right now is her malnutrition.  She needs bowel rest once a nasoduodenal tube can be placed.  Then she will need nutrition only through that tube for perhaps a week or more to allow complete bowel rest and hopefully keep these fluid collections from evolving and enlarging.  She does not currently require surgery, and I am hopeful she would not need that at all.  These fluid collections are also not yet mature enough for any endoscopic drainage.  We will communicate with the hospitalist and nutrition services to arrange placement of nasoduodenal tube, and I hope that would not be too much of a challenge on a holiday weekend.  She then needs evaluation by the nutrition service for tube feed plan and we will follow along with you.   Nelida Meuse III Office:[phone]

## 2018-07-07 NOTE — Progress Notes (Signed)
Pt was concerned about her Zalatan eye drops and Timolol, please address, Thanks Arvella Nigh RN.

## 2018-07-07 NOTE — Progress Notes (Signed)
[facility] PROGRESS NOTE  THEO REITHER PPJ:[id number] DOB: [birth year] DOA: 07/04/2018 PCP: Patient, No Pcp Per   78 yo admitted with persistent fever, generalized weakness, elevated LFT. Recent hospitalization for pancreatitis with OP f/u planned for cholesystectomy an liver biopsy.   ON CT scan found to have developing pseudocysts.   Assessment/Plan: pseudocysts -GI consult appreciated  Fever with generalized weakness; anorexia; loose stools; elevated LFT's  -  WBC within limits of normal -blood cultures no growth 2 days.  -LFTs trending down.   -CT abdomen/pelvis with pseudocysts -Continue gentle IVF hydration, electrolyte replacement, -symptom-control -full  liquid diet  Hypokalemia.  -repleted -recheck in AM  Chronic diastolic CHFremains compensated.Developed acute CHF with IVF during recent admission -Continue gentle IVF hydration anorexia - monitor daily wt and I/O's  COPDstable at baseline. No on home oxygen. -Continue Incruse and as-needed albuterol  Type II DMHgA1c 6.7.Managed at home with metformin and glipizide. Fair control -continue to hold home oral agents -SSI  Hypertension -resume home meds and monitor  hyponatremia -resolved with IVF  Code Status: full Family Communication: husband on phone 7/2 Disposition Plan: suspect needs 48-72 more hours in the hospital   Consultants:  GI consult  HPI/Subjective: Asking for some orange juice  Objective: Vitals:   07/07/18 0820 07/07/18 0905  BP: (!) 147/60   Pulse: 86 71  Resp: 18 18  Temp:    SpO2: 98% 95%    Intake/Output Summary (Last 24 hours) at 07/07/2018 1112 Last data filed at 07/07/2018 0821 Gross per 24 hour  Intake 710 ml  Output 700 ml  Net 10 ml   Filed Weights   07/05/18 0545 07/06/18 0553 07/07/18 0627  Weight: 77.1 kg 77.8 kg 76.2 kg    Exam:   General:  Uncomfortable appearing  Cardiovascular:rrr  Respiratory: no increased work of  breathing  Abdomen: BS, tender to palpation  A+Ox3  Data Reviewed: Basic Metabolic Panel: Recent Labs  Lab 07/04/18 1524 07/05/18 0130 07/05/18 1126 07/06/18 0342  NA 133* 132* 134* 136  K 2.3* 2.8* 3.2* 3.4*  CL 87* 88* 91* 93*  CO2 32 31 32 33*  GLUCOSE 89 84 96 109*  BUN 6* 6* 6* 5*  CREATININE 0.65 0.55 0.60 0.56  CALCIUM 8.2* 8.0* 7.9* 8.0*  MG  --  1.8  --   --    Liver Function Tests: Recent Labs  Lab 07/04/18 1524 07/05/18 0130  AST 60* 43*  ALT 45* 38  ALKPHOS 54 43  BILITOT 1.1 0.8  PROT 6.1* 5.7*  ALBUMIN 2.1* 2.0*   Recent Labs  Lab 07/04/18 1524  LIPASE 23   Recent Labs  Lab 07/05/18 0130  AMMONIA 25   CBC: Recent Labs  Lab 07/04/18 1524 07/05/18 0130 07/06/18 0342  WBC 10.6* 6.5 5.9  NEUTROABS 8.9* 5.0  --   HGB 11.7* 11.0* 10.5*  HCT 35.7* 33.2*  32.9* 32.9*  MCV 87.1 86.5 88.4  PLT 301 213 197   Cardiac Enzymes: Recent Labs  Lab 07/05/18 0130  CKTOTAL 34*   BNP (last 3 results) No results for input(s): BNP in the last 8760 hours.  ProBNP (last 3 results) No results for input(s): PROBNP in the last 8760 hours.  CBG: Recent Labs  Lab 07/06/18 1608 07/06/18 1942 07/07/18 0041 07/07/18 0425 07/07/18 0833  GLUCAP 115* 174* 140* 132* 218*    Recent Results (from the past 240 hour(s))  Culture, blood (routine x 2)     Status: None (Preliminary result)  Collection Time: 07/04/18  5:30 PM   Specimen: BLOOD LEFT WRIST  Result Value Ref Range Status   Specimen Description BLOOD LEFT WRIST  Final   Special Requests   Final    BOTTLES DRAWN AEROBIC ONLY Blood Culture results may not be optimal due to an inadequate volume of blood received in culture bottles   Culture   Final    NO GROWTH 3 DAYS Performed at [facility], [address] [place], [place] [address]    Report Status PENDING  Incomplete  Culture, blood (routine x 2)     Status: None (Preliminary result)   Collection Time: 07/04/18  5:40 PM    Specimen: BLOOD RIGHT HAND  Result Value Ref Range Status   Specimen Description BLOOD RIGHT HAND  Final   Special Requests   Final    BOTTLES DRAWN AEROBIC AND ANAEROBIC Blood Culture adequate volume   Culture   Final    NO GROWTH 3 DAYS Performed at [facility], [address] [place], [place] [address]    Report Status PENDING  Incomplete  SARS Coronavirus 2 (CEPHEID - Performed in [facility] lab), Hosp Order     Status: None   Collection Time: 07/04/18  6:47 PM   Specimen: Nasopharyngeal Swab  Result Value Ref Range Status   SARS Coronavirus 2 NEGATIVE NEGATIVE Final    Comment: (NOTE) If result is NEGATIVE SARS-CoV-2 target nucleic acids are NOT DETECTED. The SARS-CoV-2 RNA is generally detectable in upper and lower  respiratory specimens during the acute phase of infection. The lowest  concentration of SARS-CoV-2 viral copies this assay can detect is 250  copies / mL. A negative result does not preclude SARS-CoV-2 infection  and should not be used as the sole basis for treatment or other  patient management decisions.  A negative result may occur with  improper specimen collection / handling, submission of specimen other  than nasopharyngeal swab, presence of viral mutation(s) within the  areas targeted by this assay, and inadequate number of viral copies  (<250 copies / mL). A negative result must be combined with clinical  observations, patient history, and epidemiological information. If result is POSITIVE SARS-CoV-2 target nucleic acids are DETECTED. The SARS-CoV-2 RNA is generally detectable in upper and lower  respiratory specimens dur ing the acute phase of infection.  Positive  results are indicative of active infection with SARS-CoV-2.  Clinical  correlation with patient history and other diagnostic information is  necessary to determine patient infection status.  Positive results do  not rule out bacterial infection or co-infection with other  viruses. If result is PRESUMPTIVE POSTIVE SARS-CoV-2 nucleic acids MAY BE PRESENT.   A presumptive positive result was obtained on the submitted specimen  and confirmed on repeat testing.  While 2019 novel coronavirus  (SARS-CoV-2) nucleic acids may be present in the submitted sample  additional confirmatory testing may be necessary for epidemiological  and / or clinical management purposes  to differentiate between  SARS-CoV-2 and other Sarbecovirus currently known to infect humans.  If clinically indicated additional testing with an alternate test  methodology [phone]([id number]) is advised. The SARS-CoV-2 RNA is generally  detectable in upper and lower respiratory sp ecimens during the acute  phase of infection. The expected result is Negative. Fact Sheet for Patients:  BoilerBrush.com.cyhttps://www.fda.gov/media/136312/download Fact Sheet for Healthcare Providers: https://pope.com/https://www.fda.gov/media/136313/download This test is not yet approved or cleared by the [place] FDA and has been authorized for detection and/or diagnosis  of SARS-CoV-2 by FDA under an Emergency Use Authorization (EUA).  This EUA will remain in effect (meaning this test can be used) for the duration of the COVID-19 declaration under Section 564(b)(1) of the Act, 21 U.S.C. section 360bbb-3(b)(1), unless the authorization is terminated or revoked sooner. Performed at [facility], [address] [place], [place] [address]      Studies: Ct Abdomen Pelvis W Contrast  Result Date: 07/06/2018 CLINICAL DATA:  Pancreatitis.  Acute generalized abdominal pain. EXAM: CT ABDOMEN AND PELVIS WITH CONTRAST TECHNIQUE: Multidetector CT imaging of the abdomen and pelvis was performed using the standard protocol following bolus administration of intravenous contrast. CONTRAST:  100mL OMNIPAQUE IOHEXOL 300 MG/ML  SOLN COMPARISON:  CT scan of June 12, 2018. FINDINGS: Lower chest: Minimal bilateral pleural effusions are noted with adjacent subsegmental  atelectasis. Hepatobiliary: Hepatic steatosis is noted. No cholelithiasis or biliary dilatation is noted. Pancreas: There is interval development of multiple fluid collections around the pancreas, some with peripheral wall enhancement. These are consistent with developing pseudocysts. One noted inferiorly measures 6.7 x 4.6 cm. Another measures 7.8 x 6.1 cm. 9.1 x 5.3 cm fluid collection is seen superior to the pancreas, also consistent with developing pseudocyst. Spleen: Normal in size without focal abnormality. Adrenals/Urinary Tract: Adrenal glands are unremarkable. Kidneys are normal, without renal calculi, focal lesion, or hydronephrosis. Bladder is unremarkable. Stomach/Bowel: Stomach is within normal limits. Appendix appears normal. No evidence of bowel wall thickening, distention, or inflammatory changes. Vascular/Lymphatic: Aortic atherosclerosis. No enlarged abdominal or pelvic lymph nodes. Reproductive: Uterus and bilateral adnexa are unremarkable. Other: No hernia is noted. Musculoskeletal: No acute or significant osseous findings. IMPRESSION: Interval development of multiple large peripancreatic fluid collections with some peripheral wall enhancement, consistent with developing pseudocysts. The largest measures 9.1 x 5.3 cm superior to the pancreas. Probable hepatic steatosis. Minimal bilateral pleural effusions are noted with adjacent subsegmental atelectasis. Aortic Atherosclerosis (ICD10-I70.0). Electronically Signed   By: Lupita RaiderJames  Green Jr M.D.   On: 07/06/2018 18:41    Scheduled Meds: . amLODipine  10 mg Oral Daily  . dorzolamide-timolol  1 drop Right Eye Daily  . enoxaparin (LOVENOX) injection  40 mg Subcutaneous Q24H  . feeding supplement  1 Container Oral TID BM  . insulin aspart  0-9 Units Subcutaneous Q4H  . latanoprost  1 drop Right Eye QHS  . metoprolol succinate  100 mg Oral Daily  . pantoprazole (PROTONIX) IV  40 mg Intravenous Q24H  . sodium chloride flush  3 mL Intravenous Q12H   . umeclidinium bromide  1 puff Inhalation Daily   Continuous Infusions: . sodium chloride 250 mL (07/07/18 0829)    Principal Problem:   Abdominal pain, vomiting, and diarrhea Active Problems:   Essential hypertension   Abdominal pain   Type 2 diabetes mellitus without complication (HCC)   Elevated LFTs/Needs Liver Biopsy   Chronic diastolic CHF (congestive heart failure) (HCC)   Hyponatremia   COPD (chronic obstructive pulmonary disease) (HCC)   B12 deficiency   Hypoxia    Time spent: 25 minute    Joseph ArtJessica U Jeferson Boozer  DO  [facility]  If 7PM-7AM, please contact night-coverage at [email or web address], password [email or web address] 07/07/2018, 11:12 AM  LOS: 2 days

## 2018-07-07 NOTE — Progress Notes (Signed)
Patient has order for cortrak feeding tube placement. Tried to contact  cotrak team via [facility] however they unable to be reached after 4 om. Contacted AC,per AC if tube needs to be placed tonight IR needs to be called. Contacted MD Wilfrid Lund and made him aware. MD stated that tube does not need to placed tonight, it is ok if tube is placed tomorrow. They will try do insert tube and work on it tomorrow. Will pass it on to the next shift.

## 2018-07-07 NOTE — Care Management Important Message (Signed)
Important Message  Patient Details  Name: Heather Oconnor MRN: [id number] Date of Birth: 1940/06/05   Medicare Important Message Given:  Yes     Shelda Altes 07/07/2018, 11:59 AM

## 2018-07-07 NOTE — Progress Notes (Signed)
Called husband Jenny Reichmann and updated him about patient care plane.

## 2018-07-08 ENCOUNTER — Inpatient Hospital Stay (HOSPITAL_COMMUNITY): Payer: Medicare HMO

## 2018-07-08 DIAGNOSIS — K8512 Biliary acute pancreatitis with infected necrosis: Secondary | ICD-10-CM

## 2018-07-08 LAB — CBC
HCT: 31.1 % — ABNORMAL LOW (ref 36.0–46.0)
Hemoglobin: 9.9 g/dL — ABNORMAL LOW (ref 12.0–15.0)
MCH: 28.2 pg (ref 26.0–34.0)
MCHC: 31.8 g/dL (ref 30.0–36.0)
MCV: 88.6 fL (ref 80.0–100.0)
Platelets: 196 10*3/uL (ref 150–400)
RBC: 3.51 MIL/uL — ABNORMAL LOW (ref 3.87–5.11)
RDW: 14.2 % (ref 11.5–15.5)
WBC: 7.8 10*3/uL (ref 4.0–10.5)
nRBC: 0 % (ref 0.0–0.2)

## 2018-07-08 LAB — COMPREHENSIVE METABOLIC PANEL
ALT: 35 U/L (ref 0–44)
AST: 32 U/L (ref 15–41)
Albumin: 1.7 g/dL — ABNORMAL LOW (ref 3.5–5.0)
Alkaline Phosphatase: 45 U/L (ref 38–126)
Anion gap: 9 (ref 5–15)
BUN: 5 mg/dL — ABNORMAL LOW (ref 8–23)
CO2: 33 mmol/L — ABNORMAL HIGH (ref 22–32)
Calcium: 8.2 mg/dL — ABNORMAL LOW (ref 8.9–10.3)
Chloride: 94 mmol/L — ABNORMAL LOW (ref 98–111)
Creatinine, Ser: 0.65 mg/dL (ref 0.44–1.00)
GFR calc Af Amer: >60 mL/min (ref >60)
GFR calc non Af Amer: >60 mL/min (ref >60)
Glucose, Bld: 115 mg/dL — ABNORMAL HIGH (ref 70–99)
Potassium: 3.2 mmol/L — ABNORMAL LOW (ref 3.5–5.1)
Sodium: 136 mmol/L (ref 135–145)
Total Bilirubin: 0.7 mg/dL (ref 0.3–1.2)
Total Protein: 5.2 g/dL — ABNORMAL LOW (ref 6.5–8.1)

## 2018-07-08 LAB — GLUCOSE, CAPILLARY
Glucose-Capillary: 112 mg/dL — ABNORMAL HIGH (ref 70–99)
Glucose-Capillary: 133 mg/dL — ABNORMAL HIGH (ref 70–99)
Glucose-Capillary: 140 mg/dL — ABNORMAL HIGH (ref 70–99)
Glucose-Capillary: 169 mg/dL — ABNORMAL HIGH (ref 70–99)
Glucose-Capillary: 175 mg/dL — ABNORMAL HIGH (ref 70–99)
Glucose-Capillary: 198 mg/dL — ABNORMAL HIGH (ref 70–99)

## 2018-07-08 MED ORDER — POTASSIUM CHLORIDE 10 MEQ/100ML IV SOLN
10.0000 meq | INTRAVENOUS | Status: AC
  Administered 2018-07-08 (×2): 10 meq via INTRAVENOUS
  Filled 2018-07-08 (×2): qty 100

## 2018-07-08 MED ORDER — SODIUM CHLORIDE 0.45 % IV SOLN: INTRAVENOUS | Status: DC

## 2018-07-08 MED ORDER — VITAMIN B-12 1000 MCG PO TABS
1000.0000 ug | ORAL_TABLET | Freq: Every day | ORAL | Status: DC
  Administered 2018-07-08 – 2018-07-10 (×3): 1000 ug via ORAL
  Filled 2018-07-08 (×3): qty 1

## 2018-07-08 MED ORDER — POTASSIUM CHLORIDE IN NACL 20-0.45 MEQ/L-% IV SOLN
INTRAVENOUS | Status: AC
  Administered 2018-07-08 – 2018-07-10 (×3): via INTRAVENOUS
  Filled 2018-07-08 (×4): qty 1000

## 2018-07-08 NOTE — Progress Notes (Signed)
Per IR they don't do Cortrak placement and was referred to Diagnostic Radiology. Diagnostic Rad said we have to put NGT for now and Cortrak will replace it on Monday. Dr. Eliseo Squires made aware.

## 2018-07-08 NOTE — Progress Notes (Addendum)
Patient ID: Heather Oconnor, female   DOB: 08/03/1940, 78 y.o.   MRN: [id number]    Progress Note     Subjective  Chief complaint: Pancreatic pseudocyst  Day #4  Cortrak to be placed Monday - neither IR or Diagnostic rads can do this weekend.  Feels weak and tired - was able to take some full liquids today and drinking Resource- up to BR with help Perked up talking about having Ice cream and pepsi for dinner , and thinking about [employer] when she gets out of here  prealb =5 hgb 9.9 K= 3.2  Review of systems: Generalized weakness Nausea Denies chest pain dyspnea or dysuria   Objective   Vital signs in last 24 hours: Temp:  [98.6 F (37 C)-101.2 F (38.4 C)] 99.4 F (37.4 C) (07/04 1109) Pulse Rate:  [73-90] 73 (07/04 1109) Resp:  [17-20] 17 (07/04 1109) BP: (133-157)/(56-77) 133/56 (07/04 1109) SpO2:  [93 %-94 %] 94 % (07/04 1109) Weight:  [77 kg] 77 kg (07/04 0554) Last BM Date: 07/06/18 General:    Elderly white female in NAD Heart:  Regular rate and rhythm; no murmurs Lungs: Respirations even and unlabored, lungs CTA bilaterally Abdomen:  Soft, obese no focal tenderness and nondistended. Normal bowel sounds. Extremities:  Without edema. Neurologic:  Alert and oriented,  grossly normal neurologically. Psych:  Cooperative. Normal mood and affect.Affect flat  Intake/Output from previous day: 07/03 0701 - 07/04 0700 In: 990 [P.O.:840; IV Piggyback:150] Out: 1000 [Urine:1000] Intake/Output this shift: Total I/O In: 597 [P.O.:597] Out: 100 [Urine:100]  Lab Results: Recent Labs    07/06/18 0342 07/07/18 1039 07/08/18 0337  WBC 5.9 6.6 7.8  HGB 10.5* 10.0* 9.9*  HCT 32.9* 30.8* 31.1*  PLT 197 214 196   BMET Recent Labs    07/06/18 0342 07/07/18 1039 07/08/18 0337  NA 136 135 136  K 3.4* 3.4* 3.2*  CL 93* 92* 94*  CO2 33* 31 33*  GLUCOSE 109* 120* 115*  BUN 5* 5* 5*  CREATININE 0.56 0.54 0.65  CALCIUM 8.0* 8.3* 8.2*   LFT Recent Labs   07/08/18 0337  PROT 5.2*  ALBUMIN 1.7*  AST 32  ALT 35  ALKPHOS 45  BILITOT 0.7   PT/INR No results for input(s): LABPROT, INR in the last 72 hours.  Studies/Results: Ct Abdomen Pelvis W Contrast  Result Date: 07/06/2018 CLINICAL DATA:  Pancreatitis.  Acute generalized abdominal pain. EXAM: CT ABDOMEN AND PELVIS WITH CONTRAST TECHNIQUE: Multidetector CT imaging of the abdomen and pelvis was performed using the standard protocol following bolus administration of intravenous contrast. CONTRAST:  100mL OMNIPAQUE IOHEXOL 300 MG/ML  SOLN COMPARISON:  CT scan of June 12, 2018. FINDINGS: Lower chest: Minimal bilateral pleural effusions are noted with adjacent subsegmental atelectasis. Hepatobiliary: Hepatic steatosis is noted. No cholelithiasis or biliary dilatation is noted. Pancreas: There is interval development of multiple fluid collections around the pancreas, some with peripheral wall enhancement. These are consistent with developing pseudocysts. One noted inferiorly measures 6.7 x 4.6 cm. Another measures 7.8 x 6.1 cm. 9.1 x 5.3 cm fluid collection is seen superior to the pancreas, also consistent with developing pseudocyst. Spleen: Normal in size without focal abnormality. Adrenals/Urinary Tract: Adrenal glands are unremarkable. Kidneys are normal, without renal calculi, focal lesion, or hydronephrosis. Bladder is unremarkable. Stomach/Bowel: Stomach is within normal limits. Appendix appears normal. No evidence of bowel wall thickening, distention, or inflammatory changes. Vascular/Lymphatic: Aortic atherosclerosis. No enlarged abdominal or pelvic lymph nodes. Reproductive: Uterus and bilateral adnexa are  unremarkable. Other: No hernia is noted. Musculoskeletal: No acute or significant osseous findings. IMPRESSION: Interval development of multiple large peripancreatic fluid collections with some peripheral wall enhancement, consistent with developing pseudocysts. The largest measures 9.1 x 5.3 cm  superior to the pancreas. Probable hepatic steatosis. Minimal bilateral pleural effusions are noted with adjacent subsegmental atelectasis. Aortic Atherosclerosis (ICD10-I70.0). Electronically Signed   By: Marijo Conception M.D.   On: 07/06/2018 18:41       Assessment / Plan:    #24  78 year old white female with recent episode of acute pancreatitis for which she was hospitalized 6/8 through 06/18/2018.  Etiology of pancreatitis was not clear but felt most likely secondary to microlithiasis and plan was for outpatient surgical evaluation for laparoscopic cholecystectomy. Patient readmitted here on 07/04/2018 with progressive overall decline/failure to thrive  No significant abdominal pain but has had absolutely no appetite. Repeat CT shows no significant pancreatic inflammation, no gallstones.  She has developed 3 fairly large pseudocysts, the largest superior to the pancreas.  Fever last p.m. to 101.2-blood cultures on admit no growth   Source of fever, not clear- we will repeat chest x-ray, WBC normalized- will monitor  #2  Severe protein calorie malnutrition-pre-albumin of 5 Encouraging full liquid diet and resource breeze supplements. Plan is for core tract placement for tube feeds on Monday -that service is not available over this weekend.  #3 hypokalemia-needs corrected  magnesium and phosphorus within normal limits #4 history of CHF #5.  Adult onset diabetes mellitus #6 COPD #7 debilitation-we will need PT consult      Principal Problem:   Abdominal pain, vomiting, and diarrhea Active Problems:   Essential hypertension   Abdominal pain   Type 2 diabetes mellitus without complication (HCC)   Elevated LFTs/Needs Liver Biopsy   Chronic diastolic CHF (congestive heart failure) (HCC)   Hyponatremia   COPD (chronic obstructive pulmonary disease) ([place])   B12 deficiency   Hypoxia     LOS: 3 days   Amy Esterwood PA-C 07/08/2018, 1:22 PM    I have discussed the case with the  PA, and that is the plan I formulated. I personally interviewed and examined the patient.  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  Fever last evening, but that does not necessarily mean infection of this process.  We will monitor, fever work-up in progress.  Hold off antibiotics at this point.  I believe she will have a prolonged hospitalization requiring bowel rest and postpyloric nasoduodenal feedings.  This is delayed by the lack of availability of core tract tube placement team over the weekend.  Patient will be monitored, plan as described above.  Expect long hospital stay, repeat imaging will be needed in future to determine if pseudocysts evolved to the point that they need endoscopic intervention. No biliary obstruction thus far.  Total time 25 minutes  Nelida Meuse III Office: [phone]

## 2018-07-08 NOTE — Progress Notes (Signed)
[facility] PROGRESS NOTE  MARNAE MADANI UKG:[id number] DOB: 02/27/1940 DOA: 07/04/2018 PCP: Patient, No Pcp Per   78 yo admitted with persistent fever, generalized weakness, elevated LFT. Recent hospitalization for pancreatitis with OP f/u planned for cholesystectomy an liver biopsy.   ON CT scan found to have developing pseudocysts.   Assessment/Plan: pseudocysts -GI consult appreciated -IV PPI -nasoduodenal tube when service available -tube feeds once placed: Recommend Osmolite 1.2 @ 20 ml/hr, advance by 10 ml every 4 hours towards goal rate of 60 ml/hr. Goal rate provides 1728 kcal, 79g protein and 1180 ml H2O.  Fever with generalized weakness; anorexia; loose stools; elevated LFT's  -  WBC within limits of normal -blood cultures no growth 2 days.  -LFTs trending down.   -CT abdomen/pelvis with pseudocysts -Continue gentle IVF hydration, electrolyte replacement, -symptom control -full  liquid diet  Hypokalemia.  -repleted  Chronic diastolic CHFremains compensated.Developed acute CHF with IVF during recent admission -Continue gentle IVF hydration anorexia - monitor daily wt and I/O's  COPDstable at baseline. No on home oxygen. -Continue Incruse and as-needed albuterol  Type II DMHgA1c 6.7.Managed at home with metformin and glipizide. Fair control -continue to hold home oral agents -SSI  Hypertension -resume home meds and monitor  hyponatremia -resolved with IVF  Low B12 -replace and monitor  Code Status: full Family Communication: husband on phone 7/4 Disposition Plan: suspect needs 3-4 more days in hospital   Consultants:  GI consult  HPI/Subjective: Says she is not feeling well but unable to say why  Objective: Vitals:   07/08/18 0900 07/08/18 1109  BP: (!) 148/64 (!) 133/56  Pulse: 88 73  Resp:  17  Temp:  99.4 F (37.4 C)  SpO2:  94%    Intake/Output Summary (Last 24 hours) at 07/08/2018 1208 Last data  filed at 07/08/2018 0949 Gross per 24 hour  Intake 870 ml  Output 900 ml  Net -30 ml   Filed Weights   07/06/18 0553 07/07/18 0627 07/08/18 0554  Weight: 77.8 kg 76.2 kg 77 kg    Exam:   General:  Frail, appears older than stated age  Cardiovascular: rrr  Respiratory: no wheezinng  Abdomen: +BS, NT to palpation  A+OX3  Data Reviewed: Basic Metabolic Panel: Recent Labs  Lab 07/05/18 0130 07/05/18 1126 07/06/18 0342 07/07/18 1039 07/08/18 0337  NA 132* 134* 136 135 136  K 2.8* 3.2* 3.4* 3.4* 3.2*  CL 88* 91* 93* 92* 94*  CO2 31 32 33* 31 33*  GLUCOSE 84 96 109* 120* 115*  BUN 6* 6* 5* 5* 5*  CREATININE 0.55 0.60 0.56 0.54 0.65  CALCIUM 8.0* 7.9* 8.0* 8.3* 8.2*  MG 1.8  --   --  1.9  --   PHOS  --   --   --  3.1  --    Liver Function Tests: Recent Labs  Lab 07/04/18 1524 07/05/18 0130 07/07/18 1039 07/08/18 0337  AST 60* 43* 60* 32  ALT 45* 38 46* 35  ALKPHOS 54 43 50 45  BILITOT 1.1 0.8 0.8 0.7  PROT 6.1* 5.7* 5.6* 5.2*  ALBUMIN 2.1* 2.0* 1.9* 1.7*   Recent Labs  Lab 07/04/18 1524  LIPASE 23   Recent Labs  Lab 07/05/18 0130  AMMONIA 25   CBC: Recent Labs  Lab 07/04/18 1524 07/05/18 0130 07/06/18 0342 07/07/18 1039 07/08/18 0337  WBC 10.6* 6.5 5.9 6.6 7.8  NEUTROABS 8.9* 5.0  --   --   --   HGB 11.7* 11.0*  10.5* 10.0* 9.9*  HCT 35.7* 33.2*  32.9* 32.9* 30.8* 31.1*  MCV 87.1 86.5 88.4 89.3 88.6  PLT 301 213 197 214 196   Cardiac Enzymes: Recent Labs  Lab 07/05/18 0130  CKTOTAL 34*   BNP (last 3 results) No results for input(s): BNP in the last 8760 hours.  ProBNP (last 3 results) No results for input(s): PROBNP in the last 8760 hours.  CBG: Recent Labs  Lab 07/07/18 2143 07/08/18 0018 07/08/18 0416 07/08/18 0800 07/08/18 1112  GLUCAP 185* 140* 112* 198* 175*    Recent Results (from the past 240 hour(s))  Culture, blood (routine x 2)     Status: None (Preliminary result)   Collection Time: 07/04/18  5:30 PM    Specimen: BLOOD LEFT WRIST  Result Value Ref Range Status   Specimen Description BLOOD LEFT WRIST  Final   Special Requests   Final    BOTTLES DRAWN AEROBIC ONLY Blood Culture results may not be optimal due to an inadequate volume of blood received in culture bottles   Culture   Final    NO GROWTH 3 DAYS Performed at [facility], [address] [place], [place] [address]    Report Status PENDING  Incomplete  Culture, blood (routine x 2)     Status: None (Preliminary result)   Collection Time: 07/04/18  5:40 PM   Specimen: BLOOD RIGHT HAND  Result Value Ref Range Status   Specimen Description BLOOD RIGHT HAND  Final   Special Requests   Final    BOTTLES DRAWN AEROBIC AND ANAEROBIC Blood Culture adequate volume   Culture   Final    NO GROWTH 3 DAYS Performed at [facility], [address] [place], [place] [address]    Report Status PENDING  Incomplete  SARS Coronavirus 2 (CEPHEID - Performed in [facility] hospital lab), Hosp Order     Status: None   Collection Time: 07/04/18  6:47 PM   Specimen: Nasopharyngeal Swab  Result Value Ref Range Status   SARS Coronavirus 2 NEGATIVE NEGATIVE Final    Comment: (NOTE) If result is NEGATIVE SARS-CoV-2 target nucleic acids are NOT DETECTED. The SARS-CoV-2 RNA is generally detectable in upper and lower  respiratory specimens during the acute phase of infection. The lowest  concentration of SARS-CoV-2 viral copies this assay can detect is 250  copies / mL. A negative result does not preclude SARS-CoV-2 infection  and should not be used as the sole basis for treatment or other  patient management decisions.  A negative result may occur with  improper specimen collection / handling, submission of specimen other  than nasopharyngeal swab, presence of viral mutation(s) within the  areas targeted by this assay, and inadequate number of viral copies  (<250 copies / mL). A negative result must be combined with clinical   observations, patient history, and epidemiological information. If result is POSITIVE SARS-CoV-2 target nucleic acids are DETECTED. The SARS-CoV-2 RNA is generally detectable in upper and lower  respiratory specimens dur ing the acute phase of infection.  Positive  results are indicative of active infection with SARS-CoV-2.  Clinical  correlation with patient history and other diagnostic information is  necessary to determine patient infection status.  Positive results do  not rule out bacterial infection or co-infection with other viruses. If result is PRESUMPTIVE POSTIVE SARS-CoV-2 nucleic acids MAY BE PRESENT.   A presumptive positive result was obtained on the submitted specimen  and confirmed on repeat testing.  While 2019 novel coronavirus  (SARS-CoV-2) nucleic acids may be present in the submitted sample  additional confirmatory testing may be necessary for epidemiological  and / or clinical management purposes  to differentiate between  SARS-CoV-2 and other Sarbecovirus currently known to infect humans.  If clinically indicated additional testing with an alternate test  methodology [phone]([id number]) is advised. The SARS-CoV-2 RNA is generally  detectable in upper and lower respiratory sp ecimens during the acute  phase of infection. The expected result is Negative. Fact Sheet for Patients:  BoilerBrush.com.cyhttps://www.fda.gov/media/136312/download Fact Sheet for Healthcare Providers: https://pope.com/https://www.fda.gov/media/136313/download This test is not yet approved or cleared by the [place] FDA and has been authorized for detection and/or diagnosis of SARS-CoV-2 by FDA under an Emergency Use Authorization (EUA).  This EUA will remain in effect (meaning this test can be used) for the duration of the COVID-19 declaration under Section 564(b)(1) of the Act, 21 U.S.C. section 360bbb-3(b)(1), unless the authorization is terminated or revoked sooner. Performed at [facility], [address]  [place], [place] [address]      Studies: Ct Abdomen Pelvis W Contrast  Result Date: 07/06/2018 CLINICAL DATA:  Pancreatitis.  Acute generalized abdominal pain. EXAM: CT ABDOMEN AND PELVIS WITH CONTRAST TECHNIQUE: Multidetector CT imaging of the abdomen and pelvis was performed using the standard protocol following bolus administration of intravenous contrast. CONTRAST:  100mL OMNIPAQUE IOHEXOL 300 MG/ML  SOLN COMPARISON:  CT scan of June 12, 2018. FINDINGS: Lower chest: Minimal bilateral pleural effusions are noted with adjacent subsegmental atelectasis. Hepatobiliary: Hepatic steatosis is noted. No cholelithiasis or biliary dilatation is noted. Pancreas: There is interval development of multiple fluid collections around the pancreas, some with peripheral wall enhancement. These are consistent with developing pseudocysts. One noted inferiorly measures 6.7 x 4.6 cm. Another measures 7.8 x 6.1 cm. 9.1 x 5.3 cm fluid collection is seen superior to the pancreas, also consistent with developing pseudocyst. Spleen: Normal in size without focal abnormality. Adrenals/Urinary Tract: Adrenal glands are unremarkable. Kidneys are normal, without renal calculi, focal lesion, or hydronephrosis. Bladder is unremarkable. Stomach/Bowel: Stomach is within normal limits. Appendix appears normal. No evidence of bowel wall thickening, distention, or inflammatory changes. Vascular/Lymphatic: Aortic atherosclerosis. No enlarged abdominal or pelvic lymph nodes. Reproductive: Uterus and bilateral adnexa are unremarkable. Other: No hernia is noted. Musculoskeletal: No acute or significant osseous findings. IMPRESSION: Interval development of multiple large peripancreatic fluid collections with some peripheral wall enhancement, consistent with developing pseudocysts. The largest measures 9.1 x 5.3 cm superior to the pancreas. Probable hepatic steatosis. Minimal bilateral pleural effusions are noted with adjacent subsegmental atelectasis.  Aortic Atherosclerosis (ICD10-I70.0). Electronically Signed   By: Lupita RaiderJames  Green Jr M.D.   On: 07/06/2018 18:41    Scheduled Meds: . amLODipine  10 mg Oral Daily  . dorzolamide-timolol  1 drop Right Eye Daily  . enoxaparin (LOVENOX) injection  40 mg Subcutaneous Q24H  . feeding supplement  1 Container Oral TID BM  . insulin aspart  0-9 Units Subcutaneous Q4H  . latanoprost  1 drop Right Eye QHS  . metoprolol succinate  100 mg Oral Daily  . pantoprazole  40 mg Oral Q1200  . sodium chloride flush  3 mL Intravenous Q12H  . umeclidinium bromide  1 puff Inhalation Daily   Continuous Infusions: . sodium chloride 250 mL (07/07/18 1740)    Principal Problem:   Abdominal pain, vomiting, and diarrhea Active Problems:   Essential hypertension   Abdominal pain   Type 2 diabetes mellitus without complication (  HCC)   Elevated LFTs/Needs Liver Biopsy   Chronic diastolic CHF (congestive heart failure) (HCC)   Hyponatremia   COPD (chronic obstructive pulmonary disease) (HCC)   B12 deficiency   Hypoxia    Time spent: 25 minute    Joseph ArtJessica U Hanifah Royse  DO  [facility]  If 7PM-7AM, please contact night-coverage at [email or web address], password [email or web address] 07/08/2018, 12:08 PM  LOS: 3 days

## 2018-07-08 NOTE — Progress Notes (Addendum)
Initial Nutrition Assessment  INTERVENTION:   Once tube placed: Recommend Osmolite 1.2 @ 20 ml/hr, advance by 10 ml every 4 hours towards goal rate of 60 ml/hr. Goal rate provides 1728 kcal, 79g protein and 1180 ml H2O.  NUTRITION DIAGNOSIS:   Inadequate oral intake related to nausea, poor appetite as evidenced by per patient/family report.  GOAL:   Patient will meet greater than or equal to 90% of their needs  MONITOR:   PO intake, Supplement acceptance, Labs, Weight trends, TF tolerance, I & O's  REASON FOR ASSESSMENT:   Consult Enteral/tube feeding initiation and management  ASSESSMENT:   78 y.o. female with medical history significant for GERD, IBS, hypertension, type 2 diabetes mellitus, chronic diastolic CHF, and COPD, now presenting to the emergency department for evaluation of generalized weakness, loss of appetite, and diarrhea.  Patient was admitted to the hospital from 06/12/2018 until 06/18/2018 with abdominal pain, nausea, vomiting, and loose stools, diagnosed with acute pancreatitis, suspected to have some gallbladder sludge or small stones, there was concern for cirrhosis based on imaging, she was evaluated by GI and surgery during the hospitalization, was recommended for laparoscopic cholecystectomy with liver biopsy and went home in improved condition with outpatient follow-up.  **RD working remotely**  RD consulted to start TF once feeding tube is placed. Initially MD wanted Cortrak but unavailable over the weekend. Plan is for NGT placement and then replacement with Cortrak on Monday 7/6. Per GI note, pt to receive most of nutrition via TF for ~1 week.   Patient was recently admitted to [facility] for acute pancreatitis. After discharge on 6/14,  pt initially felt better but then started to develop nausea, abdominal pain and poor appetite. Pt mainly subsisting on liquids since.   Pt currently consuming 50-100% of liquid meal trays and has been ordered Boost Breeze  supplements for additional protein. She is drinking these.   Per weight records, pt weighed 184 lbs prior to admission to [facility] on 6/8. Pt has lost 15 lbs since that time (8% wt loss x 1 month, significant for time frame).   Per I/Os: -643 ml since admit Medications reviewed. Labs reviewed: CBGs: 112-198 Low K  NUTRITION - FOCUSED PHYSICAL EXAM:  Unable to perform -working remotely.  Diet Order:   Diet Order            Diet full liquid Room service appropriate? Yes; Fluid consistency: Thin  Diet effective now              EDUCATION NEEDS:   No education needs have been identified at this time  Skin:  Skin Assessment: Reviewed RN Assessment  Last BM:  7/2  Height:   Ht Readings from Last 1 Encounters:  07/04/18 5\' 4"  (1.626 m)    Weight:   Wt Readings from Last 1 Encounters:  07/08/18 77 kg    Ideal Body Weight:  54.5 kg  BMI:  Body mass index is 29.13 kg/m.  Estimated Nutritional Needs:   Kcal:  1600-1800  Protein:  65-80g  Fluid:  1.8L/day  Clayton Bibles, MS, RD, LDN [place] [facility] Pager: [phone] After Hours Pager: [phone]

## 2018-07-09 DIAGNOSIS — R740 Nonspecific elevation of levels of transaminase and lactic acid dehydrogenase [LDH]: Secondary | ICD-10-CM

## 2018-07-09 LAB — GLUCOSE, CAPILLARY
Glucose-Capillary: 114 mg/dL — ABNORMAL HIGH (ref 70–99)
Glucose-Capillary: 118 mg/dL — ABNORMAL HIGH (ref 70–99)
Glucose-Capillary: 124 mg/dL — ABNORMAL HIGH (ref 70–99)
Glucose-Capillary: 125 mg/dL — ABNORMAL HIGH (ref 70–99)
Glucose-Capillary: 136 mg/dL — ABNORMAL HIGH (ref 70–99)
Glucose-Capillary: 215 mg/dL — ABNORMAL HIGH (ref 70–99)

## 2018-07-09 LAB — CULTURE, BLOOD (ROUTINE X 2)
Culture: NO GROWTH
Culture: NO GROWTH
Special Requests: ADEQUATE

## 2018-07-09 MED ORDER — INSULIN ASPART 100 UNIT/ML ~~LOC~~ SOLN
0.0000 [IU] | Freq: Three times a day (TID) | SUBCUTANEOUS | Status: DC
  Administered 2018-07-10: 2 [IU] via SUBCUTANEOUS

## 2018-07-09 NOTE — Progress Notes (Signed)
Called to provide patient update to husband [name].  Notified him that Coretrak placement is planned for tomorrow 07/10/18.  Husband [name] would like an update after the Coretrak placement.  On Monday 7/6 he can be reached during the hours of 8 am-3:30 pm at his work number of [phone].

## 2018-07-09 NOTE — Progress Notes (Signed)
[facility] PROGRESS NOTE  Heather Oconnor ZOX:[id number] DOB: 06/14/1940 DOA: 07/04/2018 PCP: Heather Oconnor, No Pcp Per   78 yo admitted with persistent fever, generalized weakness, elevated LFT. Recent hospitalization for pancreatitis with OP f/u planned for cholesystectomy an liver biopsy.   ON CT scan found to have developing pseudocysts.   Assessment/Plan: pseudocysts -GI consult appreciated -IV PPI -nasoduodenal tube when service available -tube feeds once placed: Recommend Osmolite 1.2 @ 20 ml/hr, advance by 10 ml every 4 hours towards goal rate of 60 ml/hr. Goal rate provides 1728 kcal, 79g protein and 1180 ml H2O. -pre-albumin low  Fever with generalized weakness; anorexia; loose stools; elevated LFT's  -  WBC within limits of normal -blood cultures no growth 2 days.  -LFTs trending down.   -CT abdomen/pelvis with pseudocysts -Continue gentle IVF hydration, electrolyte replacement, -symptom control -full  liquid diet  Hypokalemia.  -repleted  Chronic diastolic CHFremains compensated.Developed acute CHF with IVF during recent admission -Continue gentle IVF hydration anorexia - monitor daily wt and I/O's  COPDstable at baseline. No on home oxygen. -Continue Incruse and as-needed albuterol  Type II DMHgA1c 6.7.Managed at home with metformin and glipizide. Fair control -continue to hold home oral agents -SSI- increase to moderate  Hypertension -resume home meds and monitor  hyponatremia -resolved with IVF  Low B12 -replace and monitor  Code Status: full Family Communication: husband on phone 7/4 Disposition Plan: suspect needs > 1 week in hospital   Consultants:  GI consult  HPI/Subjective: Feels the same as yesterday  Objective: Vitals:   07/09/18 0931 07/09/18 1135  BP: (!) 142/58 140/65  Pulse:  75  Resp:  18  Temp:  100 F (37.8 C)  SpO2: 92% 94%    Intake/Output Summary (Last 24 hours) at 07/09/2018 1236 Last  data filed at 07/09/2018 0900 Gross per 24 hour  Intake 1549.58 ml  Output 526 ml  Net 1023.58 ml   Filed Weights   07/07/18 0627 07/08/18 0554 07/09/18 0459  Weight: 76.2 kg 77 kg 77.8 kg    Exam:   General:  Frail, appears older than stated age  Cardiovascular: rrr  Respiratory: no wheezinng  Abdomen: +BS, NT to palpation  A+OX3  Data Reviewed: Basic Metabolic Panel: Recent Labs  Lab 07/05/18 0130 07/05/18 1126 07/06/18 0342 07/07/18 1039 07/08/18 0337  NA 132* 134* 136 135 136  K 2.8* 3.2* 3.4* 3.4* 3.2*  CL 88* 91* 93* 92* 94*  CO2 31 32 33* 31 33*  GLUCOSE 84 96 109* 120* 115*  BUN 6* 6* 5* 5* 5*  CREATININE 0.55 0.60 0.56 0.54 0.65  CALCIUM 8.0* 7.9* 8.0* 8.3* 8.2*  MG 1.8  --   --  1.9  --   PHOS  --   --   --  3.1  --    Liver Function Tests: Recent Labs  Lab 07/04/18 1524 07/05/18 0130 07/07/18 1039 07/08/18 0337  AST 60* 43* 60* 32  ALT 45* 38 46* 35  ALKPHOS 54 43 50 45  BILITOT 1.1 0.8 0.8 0.7  PROT 6.1* 5.7* 5.6* 5.2*  ALBUMIN 2.1* 2.0* 1.9* 1.7*   Recent Labs  Lab 07/04/18 1524  LIPASE 23   Recent Labs  Lab 07/05/18 0130  AMMONIA 25   CBC: Recent Labs  Lab 07/04/18 1524 07/05/18 0130 07/06/18 0342 07/07/18 1039 07/08/18 0337  WBC 10.6* 6.5 5.9 6.6 7.8  NEUTROABS 8.9* 5.0  --   --   --   HGB 11.7* 11.0* 10.5* 10.0*  9.9*  HCT 35.7* 33.2*  32.9* 32.9* 30.8* 31.1*  MCV 87.1 86.5 88.4 89.3 88.6  PLT 301 213 197 214 196   Cardiac Enzymes: Recent Labs  Lab 07/05/18 0130  CKTOTAL 34*   BNP (last 3 results) No results for input(s): BNP in the last 8760 hours.  ProBNP (last 3 results) No results for input(s): PROBNP in the last 8760 hours.  CBG: Recent Labs  Lab 07/08/18 1620 07/08/18 1951 07/09/18 0002 07/09/18 0450 07/09/18 0804  GLUCAP 133* 169* 114* 136* 215*    Recent Results (from the past 240 hour(s))  Culture, blood (routine x 2)     Status: None (Preliminary result)   Collection Time: 07/04/18   5:30 PM   Specimen: BLOOD LEFT WRIST  Result Value Ref Range Status   Specimen Description BLOOD LEFT WRIST  Final   Special Requests   Final    BOTTLES DRAWN AEROBIC ONLY Blood Culture results may not be optimal due to an inadequate volume of blood received in culture bottles   Culture   Final    NO GROWTH 4 DAYS Performed at [facility], [address] [place], [place] [address]    Report Status PENDING  Incomplete  Culture, blood (routine x 2)     Status: None (Preliminary result)   Collection Time: 07/04/18  5:40 PM   Specimen: BLOOD RIGHT HAND  Result Value Ref Range Status   Specimen Description BLOOD RIGHT HAND  Final   Special Requests   Final    BOTTLES DRAWN AEROBIC AND ANAEROBIC Blood Culture adequate volume   Culture   Final    NO GROWTH 4 DAYS Performed at [facility], [address] [place], [place] [address]    Report Status PENDING  Incomplete  SARS Coronavirus 2 (CEPHEID - Performed in [facility] hospital lab), Hosp Order     Status: None   Collection Time: 07/04/18  6:47 PM   Specimen: Nasopharyngeal Swab  Result Value Ref Range Status   SARS Coronavirus 2 NEGATIVE NEGATIVE Final    Comment: (NOTE) If result is NEGATIVE SARS-CoV-2 target nucleic acids are NOT DETECTED. The SARS-CoV-2 RNA is generally detectable in upper and lower  respiratory specimens during the acute phase of infection. The lowest  concentration of SARS-CoV-2 viral copies this assay can detect is 250  copies / mL. A negative result does not preclude SARS-CoV-2 infection  and should not be used as the sole basis for treatment or other  Heather Oconnor management decisions.  A negative result may occur with  improper specimen collection / handling, submission of specimen other  than nasopharyngeal swab, presence of viral mutation(s) within the  areas targeted by this assay, and inadequate number of viral copies  (<250 copies / mL). A negative result must be combined with clinical   observations, Heather Oconnor history, and epidemiological information. If result is POSITIVE SARS-CoV-2 target nucleic acids are DETECTED. The SARS-CoV-2 RNA is generally detectable in upper and lower  respiratory specimens dur ing the acute phase of infection.  Positive  results are indicative of active infection with SARS-CoV-2.  Clinical  correlation with Heather Oconnor history and other diagnostic information is  necessary to determine Heather Oconnor infection status.  Positive results do  not rule out bacterial infection or co-infection with other viruses. If result is PRESUMPTIVE POSTIVE SARS-CoV-2 nucleic acids MAY BE PRESENT.   A presumptive positive result was obtained on the submitted specimen  and confirmed on repeat testing.  While 2019  novel coronavirus  (SARS-CoV-2) nucleic acids may be present in the submitted sample  additional confirmatory testing may be necessary for epidemiological  and / or clinical management purposes  to differentiate between  SARS-CoV-2 and other Sarbecovirus currently known to infect humans.  If clinically indicated additional testing with an alternate test  methodology [phone]) is advised. The SARS-CoV-2 RNA is generally  detectable in upper and lower respiratory sp ecimens during the acute  phase of infection. The expected result is Negative. Fact Sheet for Patients:  StrictlyIdeas.no Fact Sheet for Healthcare Providers: BankingDealers.co.za This test is not yet approved or cleared by the [place] FDA and has been authorized for detection and/or diagnosis of SARS-CoV-2 by FDA under an Emergency Use Authorization (EUA).  This EUA will remain in effect (meaning this test can be used) for the duration of the COVID-19 declaration under Section 564(b)(1) of the Act, 21 U.S.C. section 360bbb-3(b)(1), unless the authorization is terminated or revoked sooner. Performed at [facility], [place] [address],  [place], [place] [address]      Studies: Dg Chest 2 View  Result Date: 07/08/2018 CLINICAL DATA:  Heather Oconnor with fever for 1 day. EXAM: CHEST - 2 VIEW COMPARISON:  Chest radiograph 07/04/2018 FINDINGS: Stable cardiomegaly. Small bilateral pleural effusions, left greater than right with underlying pulmonary consolidation. Bilateral interstitial pulmonary opacities. Thoracic spine degenerative changes. IMPRESSION: Cardiomegaly. Findings suggestive of interstitial pulmonary edema, small effusions and underlying atelectasis. Electronically Signed   By: Lovey Newcomer M.D.   On: 07/08/2018 15:51    Scheduled Meds: . amLODipine  10 mg Oral Daily  . dorzolamide-timolol  1 drop Right Eye Daily  . enoxaparin (LOVENOX) injection  40 mg Subcutaneous Q24H  . feeding supplement  1 Container Oral TID BM  . insulin aspart  0-9 Units Subcutaneous Q4H  . latanoprost  1 drop Right Eye QHS  . metoprolol succinate  100 mg Oral Daily  . pantoprazole  40 mg Oral Q1200  . sodium chloride flush  3 mL Intravenous Q12H  . umeclidinium bromide  1 puff Inhalation Daily  . vitamin B-12  1,000 mcg Oral Daily   Continuous Infusions: . 0.45 % NaCl with KCl 20 mEq / L 50 mL/hr at 07/08/18 1811    Principal Problem:   Abdominal pain, vomiting, and diarrhea Active Problems:   Essential hypertension   Abdominal pain   Type 2 diabetes mellitus without complication (HCC)   Elevated LFTs/Needs Liver Biopsy   Chronic diastolic CHF (congestive heart failure) (HCC)   Hyponatremia   COPD (chronic obstructive pulmonary disease) ([place])   B12 deficiency   Hypoxia    Time spent: 25 minute    [facility]  If 7PM-7AM, please contact night-coverage at [email or web address], password [facility] 07/09/2018, 12:36 PM  LOS: 4 days

## 2018-07-09 NOTE — Progress Notes (Signed)
[facility] Progress Note  Chief Complaint: Pancreatic pseudocyst  History:  Heather Oconnor is feeling much the same today, generally weak, little appetite, upper abdominal bloating but no pain.  She has been able to consume some full liquids and liquid protein calorie supplement.  She denies chest pain cough fever or dysuria  No clinical events noted since our last evaluation.  Nasoduodenal tube could not be placed over the weekend due to lack of that particular service team.  Objective:   Current Facility-Administered Medications:  .  0.45 % NaCl with KCl 20 mEq / L infusion, , Intravenous, Continuous, Geradine Girt, DO, Last Rate: 50 mL/hr at 07/08/18 1811 .  acetaminophen (TYLENOL) tablet 650 mg, 650 mg, Oral, Q6H PRN, 650 mg at 07/07/18 2153 **OR** acetaminophen (TYLENOL) suppository 650 mg, 650 mg, Rectal, Q6H PRN, Opyd, Timothy S, MD .  albuterol (PROVENTIL) (2.5 MG/3ML) 0.083% nebulizer solution 3 mL, 3 mL, Inhalation, Q6H PRN, Opyd, Timothy S, MD .  amLODipine (NORVASC) tablet 10 mg, 10 mg, Oral, Daily, Black, Karen M, NP, 10 mg at 07/09/18 0936 .  dorzolamide-timolol (COSOPT) 22.3-6.8 MG/ML ophthalmic solution 1 drop, 1 drop, Right Eye, Daily, Eulogio Bear U, DO, 1 drop at 07/09/18 0926 .  enoxaparin (LOVENOX) injection 40 mg, 40 mg, Subcutaneous, Q24H, Opyd, Ilene Qua, MD, 40 mg at 07/09/18 0920 .  feeding supplement (BOOST / RESOURCE BREEZE) liquid 1 Container, 1 Container, Oral, TID BM, Esterwood, Amy S, PA-C, 1 Container at 07/09/18 0919 .  insulin aspart (novoLOG) injection 0-9 Units, 0-9 Units, Subcutaneous, Q4H, Opyd, Ilene Qua, MD, 3 Units at 07/09/18 0845 .  latanoprost (XALATAN) 0.005 % ophthalmic solution 1 drop, 1 drop, Right Eye, QHS, Vann, Jessica U, DO, 1 drop at 07/08/18 1954 .  metoprolol succinate (TOPROL-XL) 24 hr tablet 100 mg, 100 mg, Oral, Daily, Black, Karen M, NP, 100 mg at 07/09/18 6387 .  pantoprazole (PROTONIX) EC tablet 40 mg, 40 mg, Oral, Q1200, Karren Cobble, RPH, 40 mg at 07/08/18 1139 .  sodium chloride flush (NS) 0.9 % injection 3 mL, 3 mL, Intravenous, Q12H, Opyd, Ilene Qua, MD, 3 mL at 07/08/18 0903 .  umeclidinium bromide (INCRUSE ELLIPTA) 62.5 MCG/INH 1 puff, 1 puff, Inhalation, Daily, Opyd, Ilene Qua, MD, 1 puff at 07/09/18 0858 .  vitamin B-12 (CYANOCOBALAMIN) tablet 1,000 mcg, 1,000 mcg, Oral, Daily, Eulogio Bear U, DO, 1,000 mcg at 07/09/18 0936  . 0.45 % NaCl with KCl 20 mEq / L 50 mL/hr at 07/08/18 1811     Vital signs in last 24 hrs: Vitals:   07/09/18 0900 07/09/18 0931  BP:  (!) 142/58  Pulse:    Resp:    Temp:    SpO2: 92% 92%    Intake/Output Summary (Last 24 hours) at 07/09/2018 0939 Last data filed at 07/09/2018 0800 Gross per 24 hour  Intake 1666.58 ml  Output 626 ml  Net 1040.58 ml     Physical Exam Chronically ill and fatigued and weak.  Nontoxic  HEENT: sclera anicteric, oral mucosa without lesions  Neck: supple, no thyromegaly, JVD or lymphadenopathy  Cardiac: RRR without murmurs, S1S2 heard, mild peripheral edema  Pulm: clear to auscultation bilaterally, normal RR and effort noted  Abdomen: soft, no tenderness, with active bowel sounds.  Mid upper abdominal fullness as before  Skin; warm and dry, no jaundice, pale  Recent Labs:  CBC Latest Ref Rng & Units 07/08/2018 07/07/2018 07/06/2018  WBC 4.0 - 10.5 K/uL 7.8 6.6 5.9  Hemoglobin 12.0 -  15.0 g/dL 1.6(X9.9(L) 10.0(L) 10.5(L)  Hematocrit 36.0 - 46.0 % 31.1(L) 30.8(L) 32.9(L)  Platelets 150 - 400 K/uL 196 214 197    No results for input(s): INR in the last 168 hours. CMP Latest Ref Rng & Units 07/08/2018 07/07/2018 07/06/2018  Glucose 70 - 99 mg/dL 096(E115(H) 454(U120(H) 981(X109(H)  BUN 8 - 23 mg/dL 5(L) 5(L) 5(L)  Creatinine 0.44 - 1.00 mg/dL 9.140.65 7.820.54 9.560.56  Sodium 135 - 145 mmol/L 136 135 136  Potassium 3.5 - 5.1 mmol/L 3.2(L) 3.4(L) 3.4(L)  Chloride 98 - 111 mmol/L 94(L) 92(L) 93(L)  CO2 22 - 32 mmol/L 33(H) 31 33(H)  Calcium 8.9 - 10.3 mg/dL 8.2(L) 8.3(L)  8.0(L)  Total Protein 6.5 - 8.1 g/dL 5.2(L) 5.6(L) -  Total Bilirubin 0.3 - 1.2 mg/dL 0.7 0.8 -  Alkaline Phos 38 - 126 U/L 45 50 -  AST 15 - 41 U/L 32 60(H) -  ALT 0 - 44 U/L 35 46(H) -     Radiologic studies: None new @ASSESSMENTPLANBEGIN @ Assessment: Acute pancreatitis several weeks ago 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  No gastric outlet obstruction or biliary obstruction on imaging  Severe protein calorie malnutrition Mild elevation transaminases days ago, resolved yesterday  She needs a course of postpyloric enteral nutrition for complete pancreatic rest and hopeful recovery without further evolution of the pseudocysts.  I do not think she has infection of these fluid collections.  Plan: Continue current management. Cortrak tube placement tomorrow  - nutrition consult and recommendations already completed. We will follow, and Dr. Christella HartiganJacobs will cover the consult service starting tomorrow.  Total time 25 minutes Charlie PitterHenry L Danis III Office: [phone]

## 2018-07-10 ENCOUNTER — Inpatient Hospital Stay (HOSPITAL_COMMUNITY): Payer: Medicare HMO

## 2018-07-10 DIAGNOSIS — K863 Pseudocyst of pancreas: Secondary | ICD-10-CM

## 2018-07-10 DIAGNOSIS — R197 Diarrhea, unspecified: Secondary | ICD-10-CM

## 2018-07-10 DIAGNOSIS — R1110 Vomiting, unspecified: Secondary | ICD-10-CM

## 2018-07-10 DIAGNOSIS — R109 Unspecified abdominal pain: Secondary | ICD-10-CM

## 2018-07-10 LAB — CBC
HCT: 32.7 % — ABNORMAL LOW (ref 36.0–46.0)
Hemoglobin: 10.3 g/dL — ABNORMAL LOW (ref 12.0–15.0)
MCH: 28.1 pg (ref 26.0–34.0)
MCHC: 31.5 g/dL (ref 30.0–36.0)
MCV: 89.1 fL (ref 80.0–100.0)
Platelets: 196 10*3/uL (ref 150–400)
RBC: 3.67 MIL/uL — ABNORMAL LOW (ref 3.87–5.11)
RDW: 14 % (ref 11.5–15.5)
WBC: 9.1 10*3/uL (ref 4.0–10.5)
nRBC: 0 % (ref 0.0–0.2)

## 2018-07-10 LAB — BASIC METABOLIC PANEL
Anion gap: 12 (ref 5–15)
BUN: <5 mg/dL — ABNORMAL LOW (ref 8–23)
CO2: 29 mmol/L (ref 22–32)
Calcium: 8.1 mg/dL — ABNORMAL LOW (ref 8.9–10.3)
Chloride: 94 mmol/L — ABNORMAL LOW (ref 98–111)
Creatinine, Ser: 0.52 mg/dL (ref 0.44–1.00)
GFR calc Af Amer: >60 mL/min (ref >60)
GFR calc non Af Amer: >60 mL/min (ref >60)
Glucose, Bld: 141 mg/dL — ABNORMAL HIGH (ref 70–99)
Potassium: 4 mmol/L (ref 3.5–5.1)
Sodium: 135 mmol/L (ref 135–145)

## 2018-07-10 LAB — GLUCOSE, CAPILLARY
Glucose-Capillary: 120 mg/dL — ABNORMAL HIGH (ref 70–99)
Glucose-Capillary: 127 mg/dL — ABNORMAL HIGH (ref 70–99)
Glucose-Capillary: 129 mg/dL — ABNORMAL HIGH (ref 70–99)
Glucose-Capillary: 131 mg/dL — ABNORMAL HIGH (ref 70–99)
Glucose-Capillary: 172 mg/dL — ABNORMAL HIGH (ref 70–99)

## 2018-07-10 MED ORDER — WITCH HAZEL-GLYCERIN EX PADS
MEDICATED_PAD | CUTANEOUS | Status: DC | PRN
  Administered 2018-07-10 – 2018-07-14 (×3): via TOPICAL
  Filled 2018-07-10: qty 100

## 2018-07-10 MED ORDER — PANTOPRAZOLE SODIUM 40 MG PO PACK
40.0000 mg | PACK | Freq: Every day | ORAL | Status: DC
  Administered 2018-07-11 – 2018-07-14 (×4): 40 mg
  Filled 2018-07-10 (×5): qty 20

## 2018-07-10 MED ORDER — VITAMIN B-12 1000 MCG PO TABS
1000.0000 ug | ORAL_TABLET | Freq: Every day | ORAL | Status: DC
  Administered 2018-07-11 – 2018-07-14 (×4): 1000 ug
  Filled 2018-07-10 (×4): qty 1

## 2018-07-10 MED ORDER — INSULIN ASPART 100 UNIT/ML ~~LOC~~ SOLN
0.0000 [IU] | SUBCUTANEOUS | Status: DC
  Administered 2018-07-10: 2 [IU] via SUBCUTANEOUS
  Administered 2018-07-11: 5 [IU] via SUBCUTANEOUS
  Administered 2018-07-11 (×2): 3 [IU] via SUBCUTANEOUS
  Administered 2018-07-11 (×2): 5 [IU] via SUBCUTANEOUS
  Administered 2018-07-11 – 2018-07-12 (×3): 3 [IU] via SUBCUTANEOUS
  Administered 2018-07-12: 5 [IU] via SUBCUTANEOUS
  Administered 2018-07-12: 3 [IU] via SUBCUTANEOUS
  Administered 2018-07-12 (×2): 5 [IU] via SUBCUTANEOUS
  Administered 2018-07-13: 3 [IU] via SUBCUTANEOUS
  Administered 2018-07-13: 5 [IU] via SUBCUTANEOUS
  Administered 2018-07-13 (×3): 3 [IU] via SUBCUTANEOUS
  Administered 2018-07-13: 2 [IU] via SUBCUTANEOUS
  Administered 2018-07-14: 5 [IU] via SUBCUTANEOUS
  Administered 2018-07-14 (×3): 3 [IU] via SUBCUTANEOUS
  Administered 2018-07-14: 2 [IU] via SUBCUTANEOUS

## 2018-07-10 MED ORDER — JEVITY 1.2 CAL PO LIQD
1000.0000 mL | ORAL | Status: DC
  Filled 2018-07-10: qty 1000

## 2018-07-10 MED ORDER — AMLODIPINE BESYLATE 10 MG PO TABS
10.0000 mg | ORAL_TABLET | Freq: Every day | ORAL | Status: DC
  Administered 2018-07-11 – 2018-07-14 (×4): 10 mg
  Filled 2018-07-10 (×4): qty 1

## 2018-07-10 MED ORDER — ORAL CARE MOUTH RINSE
15.0000 mL | Freq: Two times a day (BID) | OROMUCOSAL | Status: DC
  Administered 2018-07-10 – 2018-07-14 (×6): 15 mL via OROMUCOSAL

## 2018-07-10 MED ORDER — OSMOLITE 1.2 CAL PO LIQD
1000.0000 mL | ORAL | Status: DC
  Administered 2018-07-10: 1000 mL
  Filled 2018-07-10 (×2): qty 1000

## 2018-07-10 MED ORDER — METOPROLOL TARTRATE 25 MG/10 ML ORAL SUSPENSION
25.0000 mg | Freq: Two times a day (BID) | ORAL | Status: DC
  Administered 2018-07-11 – 2018-07-14 (×8): 25 mg
  Filled 2018-07-10 (×8): qty 10

## 2018-07-10 MED ORDER — OSMOLITE 1.2 CAL PO LIQD
1000.0000 mL | ORAL | Status: DC
  Administered 2018-07-10 – 2018-07-13 (×5): 1000 mL
  Filled 2018-07-10 (×8): qty 1000

## 2018-07-10 NOTE — Procedures (Signed)
Cortrak  Tube Type:  Cortrak - 43 inches Tube Location:  Left nare Initial Placement:  Stomach Secured by: Bridle Technique Used to Measure Tube Placement:  Documented cm marking at nare/ corner of mouth Cortrak Secured At:  72 cm    Cortrak Tube Team Note:  Consult received to place a Cortrak feeding tube.   X-ray is required, abdominal x-ray has been ordered by the Cortrak team. Please confirm tube placement before using the Cortrak tube.   If the tube becomes dislodged please keep the tube and contact the Cortrak team at [email or web address] (password [email or web address]) for replacement.  If after hours and replacement cannot be delayed, place a NG tube and confirm placement with an abdominal x-ray.    Koleen Distance MS, RD, LDN Pager #- [phone] Office#- [phone] After Hours Pager: [phone]

## 2018-07-10 NOTE — Progress Notes (Signed)
dtr called and given updated

## 2018-07-10 NOTE — Progress Notes (Signed)
Nutrition Follow-up  DOCUMENTATION CODES:   Not applicable  INTERVENTION:   -D/c Boost Breeze due to poor tolerance and NPO status -RD will follow for diet advancement and supplement as appropriate -Initiate Osmolite 1.2 @ 25 ml/hr via cortrak tube and increase by 10 ml every 4 hours to goal rate of 65 ml/hr.   Tube feeding regimen provides 1872 kcal (100% of needs), 87 grams of protein, and 1279 ml of H2O.   NUTRITION DIAGNOSIS:   Inadequate oral intake related to nausea, poor appetite as evidenced by per patient/family report.  Ongoing  GOAL:   Patient will meet greater than or equal to 90% of their needs  Progressing   MONITOR:   PO intake, Supplement acceptance, Labs, Weight trends, TF tolerance, I & O's  REASON FOR ASSESSMENT:   Consult Enteral/tube feeding initiation and management  ASSESSMENT:   78 y.o. female with medical history significant for GERD, IBS, hypertension, type 2 diabetes mellitus, chronic diastolic CHF, and COPD, now presenting to the emergency department for evaluation of generalized weakness, loss of appetite, and diarrhea.  Patient was admitted to the hospital from 06/12/2018 until 06/18/2018 with abdominal pain, nausea, vomiting, and loose stools, diagnosed with acute pancreatitis, suspected to have some gallbladder sludge or small stones, there was concern for cirrhosis based on imaging, she was evaluated by GI and surgery during the hospitalization, was recommended for laparoscopic cholecystectomy with liver biopsy and went home in improved condition with outpatient follow-up.  7/6- cortrak tube placed, tip of tube confirmed in stomach  Case discussed with RN and RD with cortrak tube team. Pt tolerated tube placement well and is awaiting feeding pump and formula delivery for TF initiation.   Spoke with pt at bedside, who was pleasant, but anxious to start TF. She reports that she has had minimal intake for approximately one month PTA, since being  admitted and discharged from the hospital approximately one month ago. Pt reports she has been consuming only liquids, consisting mostly of ice cream daily. She shares that she has tried to consume solid foods, "but they come right back up". Pt reports poor tolerance of clear and full liquids yesterday. She refused last two doses of Boost Breeze supplements.   Per pt, UBW us around 180#. She suspect she has lost "a few pounds", but unsure how much. Reviewed wt hx; pt has experienced 6.7% wt loss over the past 6 months, which is not significant for time frame, however, concerning in light poor oral intake. Pt reports her weight generally fluctuates due to fluid retention. Noted mild fluid retention in lower extremities, which may be masking further weight loss and muscle depletions.   Discussed with pt how she will receive nutrition (via cortrak tube). Pt expressed understanding, but very anxious to start. RD provided emotional support.   Lab Results  Component Value Date   HGBA1C 6.7 (H) 07/05/2018   PTA DM medications are 5 mg glimepiride daily and 500 mg metformin BID.   Labs reviewed: CBGS: 118-125 (inpatient orders for glycemic control are 0-15 units insulin aspart TID with meals).   NUTRITION - FOCUSED PHYSICAL EXAM:    Most Recent Value  Orbital Region  No depletion  Upper Arm Region  No depletion  Thoracic and Lumbar Region  No depletion  Buccal Region  No depletion  Temple Region  Mild depletion  Clavicle Bone Region  No depletion  Clavicle and Acromion Bone Region  No depletion  Scapular Bone Region  No depletion  Dorsal Hand  No depletion  Patellar Region  No depletion  Anterior Thigh Region  No depletion  Posterior Calf Region  No depletion  Edema (RD Assessment)  Mild  Hair  Reviewed  Eyes  Reviewed  Mouth  Reviewed  Skin  Reviewed  Nails  Reviewed       Diet Order:   Diet Order            Diet NPO time specified  Diet effective now              EDUCATION  NEEDS:   No education needs have been identified at this time  Skin:  Skin Assessment: Skin Integrity Issues: Skin Integrity Issues:: Other (Comment) Other: MASD to buttocks  Last BM:  07/10/18  Height:   Ht Readings from Last 1 Encounters:  07/04/18 5\' 4"  (1.626 m)    Weight:   Wt Readings from Last 1 Encounters:  07/10/18 78 kg    Ideal Body Weight:  54.5 kg  BMI:  Body mass index is 29.52 kg/m.  Estimated Nutritional Needs:   Kcal:  1700-1900  Protein:  85-100 grams  Fluid:  > 1.7 L    Drayven Marchena A. Jimmye Norman, RD, LDN, [place] Registered Dietitian II Certified Diabetes Care and Education Specialist Pager: [phone] After hours Pager: [phone]

## 2018-07-10 NOTE — Plan of Care (Signed)
  Problem: Elimination: Goal: Will not experience complications related to urinary retention Outcome: Completed/Met   Problem: Pain Managment: Goal: General experience of comfort will improve Outcome: Completed/Met

## 2018-07-10 NOTE — Progress Notes (Addendum)
Daily Rounding Note  07/10/2018, 9:44 AM  LOS: 5 days   SUBJECTIVE:   Chief complaint:  Evolving pancreatic pseudocysts.  Recent acute pancreatitis.     Coretrack feeding tube placed this AM Heather Oconnor, soft stool this AM.  No nausea.  Minor discomfort in epigastric area.  Some pill dysphagia, using applesauce for meds.    OBJECTIVE:         Vital signs in last 24 hours:    Temp:  [99.5 F (37.5 C)-100 F (37.8 C)] 99.5 F (37.5 C) (07/06 0439) Pulse Rate:  [75-81] 79 (07/06 0439) Resp:  [16-20] 16 (07/06 0439) BP: (140-177)/(65-76) 177/76 (07/06 0439) SpO2:  [93 %-94 %] 94 % (07/06 0841) Weight:  [78 kg] 78 kg (07/06 0439) Last BM Date: 07/09/18 Filed Weights   07/08/18 0554 07/09/18 0459 07/10/18 0439  Weight: 77 kg 77.8 kg 78 kg   General: looks chronically ill, weak, fatigued.  Alert, currently comfortable.  NGT/coretrack in place  Heart: RRR Chest: clear bil.  No cough or dyspnea Abdomen: soft, minor epigastric tenderness, no mass.  BS active.    Extremities: no CCE Neuro/Psych:  Oriented x 3. No gross deficits.    Intake/Output from previous day: 07/05 0701 - 07/06 0700 In: 1994 [P.O.:780; I.V.:1214] Out: 1477 [Urine:1475; Stool:2]  Intake/Output this shift: Total I/O In: -  Out: 200 [Urine:200]  Lab Results: Recent Labs    07/07/18 1039 07/08/18 0337 07/10/18 0550  WBC 6.6 7.8 9.1  HGB 10.0* 9.9* 10.3*  HCT 30.8* 31.1* 32.7*  PLT 214 196 196   BMET Recent Labs    07/07/18 1039 07/08/18 0337 07/10/18 0550  NA 135 136 135  K 3.4* 3.2* 4.0  CL 92* 94* 94*  CO2 31 33* 29  GLUCOSE 120* 115* 141*  BUN 5* 5* <5*  CREATININE 0.54 0.65 0.52  CALCIUM 8.3* 8.2* 8.1*   LFT Recent Labs    07/07/18 1039 07/08/18 0337  PROT 5.6* 5.2*  ALBUMIN 1.9* 1.7*  AST 60* 32  ALT 46* 35  ALKPHOS 50 45  BILITOT 0.8 0.7   PT/INR No results for input(s): LABPROT, INR in the last 72 hours. Hepatitis  Panel No results for input(s): HEPBSAG, HCVAB, HEPAIGM, HEPBIGM in the last 72 hours.  Studies/Results: Dg Chest 2 View  Result Date: 07/08/2018 CLINICAL DATA:  Patient with fever for 1 day. EXAM: CHEST - 2 VIEW COMPARISON:  Chest radiograph 07/04/2018 FINDINGS: Stable cardiomegaly. Small bilateral pleural effusions, left greater than right with underlying pulmonary consolidation. Bilateral interstitial pulmonary opacities. Thoracic spine degenerative changes. IMPRESSION: Cardiomegaly. Findings suggestive of interstitial pulmonary edema, small effusions and underlying atelectasis. Electronically Signed   By: Annia Beltrew  Davis M.D.   On: 07/08/2018 15:51   Scheduled Meds: . amLODipine  10 mg Oral Daily  . dorzolamide-timolol  1 drop Right Eye Daily  . enoxaparin (LOVENOX) injection  40 mg Subcutaneous Q24H  . feeding supplement  1 Container Oral TID BM  . insulin aspart  0-15 Units Subcutaneous TID WC  . latanoprost  1 drop Right Eye QHS  . metoprolol succinate  100 mg Oral Daily  . pantoprazole  40 mg Oral Q1200  . sodium chloride flush  3 mL Intravenous Q12H  . umeclidinium bromide  1 puff Inhalation Daily  . vitamin B-12  1,000 mcg Oral Daily   Continuous Infusions: . 0.45 % NaCl with KCl 20 mEq / L 50 mL/hr at 07/10/18 0957  .  feeding supplement (OSMOLITE 1.2 CAL)     PRN Meds:.acetaminophen **OR** acetaminophen, albuterol  ASSESMENT:   *    Walled off pancreatic necrosis, evolving pseudocysts (3 fluid collections).  This causing mass effect on stomach and anorexia, wt loss.    Acute,  pancreatitis dx with admission 6/8 -6/14. NO gallstone and no CBD stones on imaging. ? If pancreatitis due to microlithiasis)?  Future Lap chole and liver bx recommended after convalescence; planned office fup with Heather Oconnor (who saw pt at [facility]).    *   Protein malnutrition in setting of diminished po intake.   Coretrack, post pyloric FT now in place.  Osmolite 1.2, goal rate 60 ml/hour rec per RD    *   ? Cirrhosis of liver, ascites per ultrasound, CT in 06/2018  *   Normocytic anemia.    *   DM 2.    *   IBS-D, hx fecal seepage.  Last colonoscopy was 2009: hemorrhoids, long redundant colon, L diverticulae.     PLAN   *   Begin tube feedings (Osmolite 1.2, goal rate 60 ml/hour rec per RD)    so long as xray confirms proper tube placement.      FYI: spouse is Heather Oconnor, phone [phone].    Heather Oconnor  07/10/2018, 9:44 AM Phone [phone]   ________________________________________________________________________  Heather Oconnor GI MD note:  I personally examined the patient, reviewed the data and agree with the assessment and plan described above.    First, I am not sure what caused her pancreatitis last month. She does not have gallstones in her GB however her LFTs were slightly elevated at admission and so it was presumed that she had 'biliary, gallstone' pancreatitis. She does not drink alcohol.  She was taking ACEI/HCTZ and the HCTZ component has been reported to cause pancreatitis. Pancreatitis does not run in her family and she's never had it before.  Would never resume HCTZ.  Second, I am not sure that she actually has cirrhosis. She has no risk factors and the imaging ([place] and 2 CT scans) do not prove nodularity to her liver, just that her liver his a bit hyperechoic. Her platelets are normal and her LFTs have largely normalized since this all started.  No INR to gauge coagulopathy.  I have real doubts that she has cirrhosis, will order INR for the morning.  Currently the peripancreatic fluid collections are not nearly mature enough to consider treatment.  She has been unable to eat (probably more from anorexia, nausea than mechanical obstruction since she hasn't really been vomiting and no GOO on imaging).  Of utmost importance is optimizing her nutritional status and addressing her very deconditioned body.  She is very likely going to need weeks of tube feed nutrition.   Heather Loffler, MD [facility] Pager [phone]

## 2018-07-10 NOTE — Progress Notes (Signed)
[facility] PROGRESS NOTE  Myrene Galaslizabeth W Milliron ZOX:[id number] DOB: 01/31/1940 DOA: 07/04/2018 PCP: Patient, No Pcp Per  Assessment/Plan:  pseudocysts -GI consult appreciated -IV PPI -nasoduodenal tube inserted today.  -tube feeds once placement confirmed: RecommendOsmolite1.2 @ 20 ml/hr, advance by 10 ml every 4 hours towards goal rate of 60 ml/hr. Goal rate provides 1728 kcal, 79g protein and 1180ml H2O. -pre-albumin low  Fever with generalized weakness; anorexia; loose stools; elevated LFT's Max temp 100 orally. WBC within limits of normal. blood cultures no growth 2 days. LFTs WNL. CT abdomen/pelvis with pseudocysts -tube feeding when tube placement confirmed -Continue gentle IVF hydration, electrolyte replacement, -symptom control -full  liquid diet  Hypokalemia. Resolved this am -repleted  Chronic diastolic CHFremains compensated.Developed acute CHF with IVF during recent admission -Continue gentle IVF hydration anorexia -monitor daily wt and I/O's  COPDstable at baseline. No on home oxygen. -Continue Incruse and as-needed albuterol  Type II DMHgA1c 6.7.Managed at home with metformin and glipizide. Fair control -continue to hold home oral agents -SSI- increase to moderate  Hypertension BP high end of normal -resume home meds and monitor  hyponatremia -resolved with IVF  Low B12 -replace and monitor   Code Status: full Family Communication: patient  Disposition Plan: home when ready   Consultants:  [facility]  Procedures:  Tube placement  Antibiotics:    HPI/Subjective: 78 yo admitted with persistent fever, generalized weakness, elevated LFT. Recent hospitalization for pancreatitis with OP f/u planned for cholesystectomy an liver biopsy.   ON CT scan found to have developing pseudocysts.  Objective: Vitals:   07/10/18 0951 07/10/18 1159  BP: (!) 168/71 (!) 152/68  Pulse: 83 71  Resp:  18  Temp:  98.9 F (37.2  C)  SpO2: 93% 99%    Intake/Output Summary (Last 24 hours) at 07/10/2018 1206 Last data filed at 07/10/2018 1100 Gross per 24 hour  Intake 2143.01 ml  Output 1677 ml  Net 466.01 ml   Filed Weights   07/08/18 0554 07/09/18 0459 07/10/18 0439  Weight: 77 kg 77.8 kg 78 kg    Exam:   General:  Awake alert no acute distress  Cardiovascular: rrr no mgr no LE edeam  Respiratory: normal effort BS distant but clear no wheeze or crackles  Abdomen: obese soft +BS no guarding or rebounding  Musculoskeletal: joints without swelling/erythema   Data Reviewed: Basic Metabolic Panel: Recent Labs  Lab 07/05/18 0130 07/05/18 1126 07/06/18 0342 07/07/18 1039 07/08/18 0337 07/10/18 0550  NA 132* 134* 136 135 136 135  K 2.8* 3.2* 3.4* 3.4* 3.2* 4.0  CL 88* 91* 93* 92* 94* 94*  CO2 31 32 33* 31 33* 29  GLUCOSE 84 96 109* 120* 115* 141*  BUN 6* 6* 5* 5* 5* <5*  CREATININE 0.55 0.60 0.56 0.54 0.65 0.52  CALCIUM 8.0* 7.9* 8.0* 8.3* 8.2* 8.1*  MG 1.8  --   --  1.9  --   --   PHOS  --   --   --  3.1  --   --    Liver Function Tests: Recent Labs  Lab 07/04/18 1524 07/05/18 0130 07/07/18 1039 07/08/18 0337  AST 60* 43* 60* 32  ALT 45* 38 46* 35  ALKPHOS 54 43 50 45  BILITOT 1.1 0.8 0.8 0.7  PROT 6.1* 5.7* 5.6* 5.2*  ALBUMIN 2.1* 2.0* 1.9* 1.7*   Recent Labs  Lab 07/04/18 1524  LIPASE 23   Recent Labs  Lab 07/05/18 0130  AMMONIA 25   CBC:  Recent Labs  Lab 07/04/18 1524 07/05/18 0130 07/06/18 0342 07/07/18 1039 07/08/18 0337 07/10/18 0550  WBC 10.6* 6.5 5.9 6.6 7.8 9.1  NEUTROABS 8.9* 5.0  --   --   --   --   HGB 11.7* 11.0* 10.5* 10.0* 9.9* 10.3*  HCT 35.7* 33.2*  32.9* 32.9* 30.8* 31.1* 32.7*  MCV 87.1 86.5 88.4 89.3 88.6 89.1  PLT 301 213 197 214 196 196   Cardiac Enzymes: Recent Labs  Lab 07/05/18 0130  CKTOTAL 34*   BNP (last 3 results) No results for input(s): BNP in the last 8760 hours.  ProBNP (last 3 results) No results for input(s): PROBNP  in the last 8760 hours.  CBG: Recent Labs  Lab 07/09/18 1132 07/09/18 1658 07/09/18 2125 07/10/18 0618 07/10/18 1202  GLUCAP 125* 118* 124* 127* 131*    Recent Results (from the past 240 hour(s))  Culture, blood (routine x 2)     Status: None   Collection Time: 07/04/18  5:30 PM   Specimen: BLOOD LEFT WRIST  Result Value Ref Range Status   Specimen Description BLOOD LEFT WRIST  Final   Special Requests   Final    BOTTLES DRAWN AEROBIC ONLY Blood Culture results may not be optimal due to an inadequate volume of blood received in culture bottles   Culture   Final    NO GROWTH 5 DAYS Performed at [facility], [address] [place], [place] [address]    Report Status 07/09/2018 FINAL  Final  Culture, blood (routine x 2)     Status: None   Collection Time: 07/04/18  5:40 PM   Specimen: BLOOD RIGHT HAND  Result Value Ref Range Status   Specimen Description BLOOD RIGHT HAND  Final   Special Requests   Final    BOTTLES DRAWN AEROBIC AND ANAEROBIC Blood Culture adequate volume   Culture   Final    NO GROWTH 5 DAYS Performed at [facility], [address] [place], [place] [address]    Report Status 07/09/2018 FINAL  Final  SARS Coronavirus 2 (CEPHEID - Performed in [facility] hospital lab), Hosp Order     Status: None   Collection Time: 07/04/18  6:47 PM   Specimen: Nasopharyngeal Swab  Result Value Ref Range Status   SARS Coronavirus 2 NEGATIVE NEGATIVE Final    Comment: (NOTE) If result is NEGATIVE SARS-CoV-2 target nucleic acids are NOT DETECTED. The SARS-CoV-2 RNA is generally detectable in upper and lower  respiratory specimens during the acute phase of infection. The lowest  concentration of SARS-CoV-2 viral copies this assay can detect is 250  copies / mL. A negative result does not preclude SARS-CoV-2 infection  and should not be used as the sole basis for treatment or other  patient management decisions.  A negative result may occur with  improper  specimen collection / handling, submission of specimen other  than nasopharyngeal swab, presence of viral mutation(s) within the  areas targeted by this assay, and inadequate number of viral copies  (<250 copies / mL). A negative result must be combined with clinical  observations, patient history, and epidemiological information. If result is POSITIVE SARS-CoV-2 target nucleic acids are DETECTED. The SARS-CoV-2 RNA is generally detectable in upper and lower  respiratory specimens dur ing the acute phase of infection.  Positive  results are indicative of active infection with SARS-CoV-2.  Clinical  correlation with patient history and other diagnostic information is  necessary to determine patient infection  status.  Positive results do  not rule out bacterial infection or co-infection with other viruses. If result is PRESUMPTIVE POSTIVE SARS-CoV-2 nucleic acids MAY BE PRESENT.   A presumptive positive result was obtained on the submitted specimen  and confirmed on repeat testing.  While 2019 novel coronavirus  (SARS-CoV-2) nucleic acids may be present in the submitted sample  additional confirmatory testing may be necessary for epidemiological  and / or clinical management purposes  to differentiate between  SARS-CoV-2 and other Sarbecovirus currently known to infect humans.  If clinically indicated additional testing with an alternate test  methodology [id number]) is advised. The SARS-CoV-2 RNA is generally  detectable in upper and lower respiratory sp ecimens during the acute  phase of infection. The expected result is Negative. Fact Sheet for Patients:  StrictlyIdeas.no Fact Sheet for Healthcare Providers: BankingDealers.co.za This test is not yet approved or cleared by the [place] FDA and has been authorized for detection and/or diagnosis of SARS-CoV-2 by FDA under an Emergency Use Authorization (EUA).  This EUA will remain in  effect (meaning this test can be used) for the duration of the COVID-19 declaration under Section 564(b)(1) of the Act, 21 U.S.C. section 360bbb-3(b)(1), unless the authorization is terminated or revoked sooner. Performed at [facility], [place] [address], [place], [place] [address]      Studies: Dg Chest 2 View  Result Date: 07/08/2018 CLINICAL DATA:  Patient with fever for 1 day. EXAM: CHEST - 2 VIEW COMPARISON:  Chest radiograph 07/04/2018 FINDINGS: Stable cardiomegaly. Small bilateral pleural effusions, left greater than right with underlying pulmonary consolidation. Bilateral interstitial pulmonary opacities. Thoracic spine degenerative changes. IMPRESSION: Cardiomegaly. Findings suggestive of interstitial pulmonary edema, small effusions and underlying atelectasis. Electronically Signed   By: Lovey Newcomer M.D.   On: 07/08/2018 15:51   Dg Abd Portable 1v  Result Date: 07/10/2018 CLINICAL DATA:  78 year old female with history of pancreatitis, status post feeding tube placement. EXAM: PORTABLE ABDOMEN - 1 VIEW COMPARISON:  CT of the abdomen pelvis dated 07/06/2018 FINDINGS: A feeding tube is partially visualized with tip to the right of the spine likely in the distal stomach. No dilatation of the small bowel. Air is noted in the colon. No free air identified on the provided image. Left lung base consolidative changes partially visualized. There is atherosclerotic calcification of the abdominal aorta and iliac arteries. Degenerative changes of the spine. No acute osseous pathology. IMPRESSION: Feeding tube with tip likely in the distal stomach. Electronically Signed   By: Anner Crete M.D.   On: 07/10/2018 10:43    Scheduled Meds: . amLODipine  10 mg Oral Daily  . dorzolamide-timolol  1 drop Right Eye Daily  . enoxaparin (LOVENOX) injection  40 mg Subcutaneous Q24H  . feeding supplement  1 Container Oral TID BM  . insulin aspart  0-15 Units Subcutaneous TID WC  . latanoprost  1 drop  Right Eye QHS  . metoprolol succinate  100 mg Oral Daily  . pantoprazole  40 mg Oral Q1200  . sodium chloride flush  3 mL Intravenous Q12H  . umeclidinium bromide  1 puff Inhalation Daily  . vitamin B-12  1,000 mcg Oral Daily   Continuous Infusions: . 0.45 % NaCl with KCl 20 mEq / L 50 mL/hr at 07/10/18 0957  . feeding supplement (OSMOLITE 1.2 CAL)      Principal Problem:   Abdominal pain, vomiting, and diarrhea Active Problems:   Elevated LFTs/Needs Liver Biopsy   Chronic diastolic CHF (congestive  heart failure) (HCC)   Hyponatremia   Hypoxia   Pseudocyst of pancreas   Essential hypertension   Type 2 diabetes mellitus without complication (HCC)   COPD (chronic obstructive pulmonary disease) (HCC)   B12 deficiency   Abdominal pain    Time spent: 44 minutes    [facility],KAREN M NP  [facility] If 7PM-7AM, please contact night-coverage at [email or web address], password [email or web address] 07/10/2018, 12:06 PM  LOS: 5 days

## 2018-07-10 NOTE — Progress Notes (Signed)
Santiago Glad black  NP in to see pt, inquired about coretrack, spoke to [facility] team and as long as consult in then will be done later this am, advised there is order for coretrack, updated Santiago Glad

## 2018-07-11 ENCOUNTER — Encounter (HOSPITAL_COMMUNITY): Payer: Self-pay | Admitting: General Practice

## 2018-07-11 ENCOUNTER — Ambulatory Visit: Payer: Medicare HMO | Admitting: Family Medicine

## 2018-07-11 LAB — BASIC METABOLIC PANEL
Anion gap: 10 (ref 5–15)
BUN: <5 mg/dL — ABNORMAL LOW (ref 8–23)
CO2: 27 mmol/L (ref 22–32)
Calcium: 8 mg/dL — ABNORMAL LOW (ref 8.9–10.3)
Chloride: 94 mmol/L — ABNORMAL LOW (ref 98–111)
Creatinine, Ser: 0.53 mg/dL (ref 0.44–1.00)
GFR calc Af Amer: >60 mL/min (ref >60)
GFR calc non Af Amer: >60 mL/min (ref >60)
Glucose, Bld: 227 mg/dL — ABNORMAL HIGH (ref 70–99)
Potassium: 4.1 mmol/L (ref 3.5–5.1)
Sodium: 131 mmol/L — ABNORMAL LOW (ref 135–145)

## 2018-07-11 LAB — CBC
HCT: 31.8 % — ABNORMAL LOW (ref 36.0–46.0)
Hemoglobin: 10.1 g/dL — ABNORMAL LOW (ref 12.0–15.0)
MCH: 28.2 pg (ref 26.0–34.0)
MCHC: 31.8 g/dL (ref 30.0–36.0)
MCV: 88.8 fL (ref 80.0–100.0)
Platelets: 201 10*3/uL (ref 150–400)
RBC: 3.58 MIL/uL — ABNORMAL LOW (ref 3.87–5.11)
RDW: 13.8 % (ref 11.5–15.5)
WBC: 11 10*3/uL — ABNORMAL HIGH (ref 4.0–10.5)
nRBC: 0 % (ref 0.0–0.2)

## 2018-07-11 LAB — PHOSPHORUS: Phosphorus: 2.7 mg/dL (ref 2.5–4.6)

## 2018-07-11 LAB — GLUCOSE, CAPILLARY
Glucose-Capillary: 154 mg/dL — ABNORMAL HIGH (ref 70–99)
Glucose-Capillary: 171 mg/dL — ABNORMAL HIGH (ref 70–99)
Glucose-Capillary: 198 mg/dL — ABNORMAL HIGH (ref 70–99)
Glucose-Capillary: 204 mg/dL — ABNORMAL HIGH (ref 70–99)
Glucose-Capillary: 208 mg/dL — ABNORMAL HIGH (ref 70–99)
Glucose-Capillary: 218 mg/dL — ABNORMAL HIGH (ref 70–99)

## 2018-07-11 LAB — MAGNESIUM: Magnesium: 1.7 mg/dL (ref 1.7–2.4)

## 2018-07-11 LAB — PROTIME-INR
INR: 1.2 (ref 0.8–1.2)
Prothrombin Time: 14.8 seconds (ref 11.4–15.2)

## 2018-07-11 MED ORDER — SODIUM CHLORIDE 0.9 % IV SOLN
INTRAVENOUS | Status: DC
  Administered 2018-07-11 – 2018-07-12 (×2): via INTRAVENOUS

## 2018-07-11 MED ORDER — HYDROCORTISONE 1 % EX CREA
TOPICAL_CREAM | Freq: Four times a day (QID) | CUTANEOUS | Status: DC
  Administered 2018-07-11 – 2018-07-14 (×15): via TOPICAL
  Filled 2018-07-11: qty 28

## 2018-07-11 MED ORDER — MAGNESIUM SULFATE 2 GM/50ML IV SOLN
2.0000 g | Freq: Once | INTRAVENOUS | Status: AC
  Administered 2018-07-11: 11:00:00 2 g via INTRAVENOUS
  Filled 2018-07-11: qty 50

## 2018-07-11 NOTE — Progress Notes (Addendum)
Daily Rounding Note  07/11/2018, 11:21 AM  LOS: 6 days   SUBJECTIVE:   Chief complaint:  Pancreatic necrosis and evolving pseudocysts.     C/O pain in rectum from hemorrhoids.  Hospitalist added Tucks wipes and hydrocortisone cream. Tolerating tube feeds, current rate is 50 mL's/hour.  OBJECTIVE:         Vital signs in last 24 hours:    Temp:  [98.9 F (37.2 C)-100.2 F (37.9 C)] 99.8 F (37.7 C) (07/07 0819) Pulse Rate:  [71-89] 87 (07/07 0819) Resp:  [16-18] 16 (07/07 0819) BP: (146-165)/(63-85) 146/63 (07/07 0819) SpO2:  [93 %-99 %] 93 % (07/07 0837) FiO2 (%):  [0 %] 0 % (07/07 0819) Weight:  [78.2 kg] 78.2 kg (07/07 0441) Last BM Date: 07/09/18 Filed Weights   07/09/18 0459 07/10/18 0439 07/11/18 0441  Weight: 77.8 kg 78 kg 78.2 kg   General: Looks unwell, ill.  A bit more alert than yesterday.  Comfortable. Heart: RRR. Chest: Clear bilaterally.  No labored breathing. Abdomen: Soft.  Tender in the left upper quadrant without guarding or rebound.  Bowel sounds normal quality but hypoactive. Rectal: Nonbleeding, nonthrombosed external hemorrhoids Extremities: No CCE. Neuro/Psych: Appropriate.  Not confused.  Moves all 4 limbs.   Lab Results: Recent Labs    07/10/18 0550 07/11/18 0533  WBC 9.1 11.0*  HGB 10.3* 10.1*  HCT 32.7* 31.8*  PLT 196 201   BMET Recent Labs    07/10/18 0550 07/11/18 0533  NA 135 131*  K 4.0 4.1  CL 94* 94*  CO2 29 27  GLUCOSE 141* 227*  BUN <5* <5*  CREATININE 0.52 0.53  CALCIUM 8.1* 8.0*   LFT No results for input(s): PROT, ALBUMIN, AST, ALT, ALKPHOS, BILITOT, BILIDIR, IBILI in the last 72 hours. PT/INR Recent Labs    07/11/18 0533  LABPROT 14.8  INR 1.2    Studies/Results: Dg Abd Portable 1v  Result Date: 07/10/2018 CLINICAL DATA:  78 year old female with history of pancreatitis, status post feeding tube placement. EXAM: PORTABLE ABDOMEN - 1 VIEW COMPARISON:   CT of the abdomen pelvis dated 07/06/2018 FINDINGS: A feeding tube is partially visualized with tip to the right of the spine likely in the distal stomach. No dilatation of the small bowel. Air is noted in the colon. No free air identified on the provided image. Left lung base consolidative changes partially visualized. There is atherosclerotic calcification of the abdominal aorta and iliac arteries. Degenerative changes of the spine. No acute osseous pathology. IMPRESSION: Feeding tube with tip likely in the distal stomach. Electronically Signed   By: Anner Crete M.D.   On: 07/10/2018 10:43   Scheduled Meds: . amLODipine  10 mg Per Tube Daily  . dorzolamide-timolol  1 drop Right Eye Daily  . enoxaparin (LOVENOX) injection  40 mg Subcutaneous Q24H  . hydrocortisone cream   Topical QID  . insulin aspart  0-15 Units Subcutaneous Q4H  . latanoprost  1 drop Right Eye QHS  . mouth rinse  15 mL Mouth Rinse BID  . metoprolol tartrate  25 mg Per Tube BID  . pantoprazole sodium  40 mg Per Tube Daily  . sodium chloride flush  3 mL Intravenous Q12H  . umeclidinium bromide  1 puff Inhalation Daily  . vitamin B-12  1,000 mcg Per Tube Daily   Continuous Infusions: . sodium chloride 50 mL/hr at 07/11/18 1055  . feeding supplement (OSMOLITE 1.2 CAL) 1,000 mL (07/11/18 1058)  .  magnesium sulfate bolus IVPB     PRN Meds:.acetaminophen **OR** acetaminophen, albuterol, witch hazel-glycerin   ASSESMENT:   *    Walled off pancreatic necrosis, evolving pseudocysts (3 fluid collections). Secondary mass effect on stomach with anorexia, wt loss.     Acute pancreatitis dx with admission 6/8 -6/14. NO gallstone and no CBD stones on imaging. But ? If pancreatitis due to microlithiasis?  Future Lap chole and liver bx recommended after convalescence; has not yet had office fup with Dr Algis GreenhouseLindsay Bridges ([facility]    *   Protein malnutrition.    Coretrack, post pyloric FT now in place.  Osmolite 1.2,  goal rate 60 ml/hour rec per RD   *   ? Cirrhosis of liver, ascites per ultrasound, CT in 06/2018  *   Hyponatremia.    *   Normocytic anemia.  Hgb stable.    *   DM 2.    *   IBS-D, hx fecal seepage.  Last colonoscopy was 2009: hemorrhoids, long redundant colon, L diverticulae.       PLAN   *    Supportive care for now with core track Osmolite tube feedings.    Jennye MoccasinSarah Gribbin  07/11/2018, 11:21 AM Phone [phone]  ________________________________________________________________________  [facility] MD note:  I personally examined the patient, reviewed the data and agree with the assessment and plan described above.  The tip of the coretrak is in her distal stomach but I don't think it necessarily needs to be advanced because since TFs were started yesterday she's had no vomiting, nausea or worsening of abd pains.  It is very important that she get adequate nutrition.  Also very important that she regain her strength with therapy.  She can drink water, really any liquids as long as it doesn't cause nausea or vomiting. I will order clear liquids for now.   Rob Buntinganiel Jolean Madariaga, MD [facility] Pager [phone]

## 2018-07-11 NOTE — Progress Notes (Signed)
Inpatient Diabetes Program Recommendations  AACE/ADA: New Consensus Statement on Inpatient Glycemic Control (2015)  Target Ranges:  Prepandial:   less than 140 mg/dL      Peak postprandial:   less than 180 mg/dL (1-2 hours)      Critically ill patients:  140 - 180 mg/dL   Lab Results  Component Value Date   GLUCAP 218 (H) 07/11/2018   HGBA1C 6.7 (H) 07/05/2018    Review of Glycemic Control Results for ADDALEE, KAVANA[place] (MRN [id number]) as of 07/11/2018 11:08  Ref. Range 07/10/2018 23:45 07/11/2018 05:25 07/11/2018 08:02  Glucose-Capillary Latest Ref Range: 70 - 99 mg/dL 172 (H) 208 (H) 218 (H)   Diabetes history: Type 2 DM Outpatient Diabetes medications: Glipizide 5 mg QD, Metformin 500 mg BID Current orders for Inpatient glycemic control: Novolog 0-15 units Q4H  Inpatient Diabetes Program Recommendations:    Noted consult and start of tube feeds. Slightly increased above inpatient goals of 180 mg/dL.  Could consider starting Novolog 2 units Q4H (to be stopped or held if tube feeds are stopped).   Thanks, Bronson Curb, MSN, RNC-OB Diabetes Coordinator [phone] (8a-5p)

## 2018-07-11 NOTE — Progress Notes (Signed)
Nutrition Follow-up  DOCUMENTATION CODES:   Not applicable  INTERVENTION:   -Continue Osmolite 1.2 @ 65 ml/hr via cortrak tube  Tube feeding regimen provides 1872 kcal (100% of needs), 87 grams of protein, and 1279 ml of H2O.   NUTRITION DIAGNOSIS:   Inadequate oral intake related to nausea, poor appetite as evidenced by per patient/family report.  Ongoing  GOAL:   Patient will meet greater than or equal to 90% of their needs  Met with TF  MONITOR:   PO intake, Supplement acceptance, Labs, Weight trends, TF tolerance, I & O's  REASON FOR ASSESSMENT:   Consult Enteral/tube feeding initiation and management  ASSESSMENT:   78 y.o. female with medical history significant for GERD, IBS, hypertension, type 2 diabetes mellitus, chronic diastolic CHF, and COPD, now presenting to the emergency department for evaluation of generalized weakness, loss of appetite, and diarrhea.  Patient was admitted to the hospital from 06/12/2018 until 06/18/2018 with abdominal pain, nausea, vomiting, and loose stools, diagnosed with acute pancreatitis, suspected to have some gallbladder sludge or small stones, there was concern for cirrhosis based on imaging, she was evaluated by GI and surgery during the hospitalization, was recommended for laparoscopic cholecystectomy with liver biopsy and went home in improved condition with outpatient follow-up.  7/6- cortrak tube placed, tip of tube confirmed in stomach  Reviewed I/O's: +725 ml x 24 hours and +1.7 L since admission  UOP: 850 ml x 24 hours  Pt sleeping soundly at time of visit. Pt did not appear in distress. RD did not disturb pt.   Pt just advanced to clear liquids. She is tolerating TF well; Osmolite 1.2 infusing via cortrak tube at goal rate of 65 ml/hr.   Labs reviewed: Na: 131 (on IV supplementation), K, Mg, and Phos WDL. CBGS: 371-062 (inpatient orders for glycemic control are 0-15 units insulin aspart every 4 hours).   NUTRITION -  FOCUSED PHYSICAL EXAM:    Most Recent Value  Orbital Region  No depletion  Upper Arm Region  No depletion  Thoracic and Lumbar Region  No depletion  Buccal Region  No depletion  Temple Region  Mild depletion  Clavicle Bone Region  No depletion  Clavicle and Acromion Bone Region  No depletion  Scapular Bone Region  No depletion  Dorsal Hand  No depletion  Patellar Region  No depletion  Anterior Thigh Region  No depletion  Posterior Calf Region  No depletion  Edema (RD Assessment)  Mild  Hair  Reviewed  Eyes  Reviewed  Mouth  Reviewed  Skin  Reviewed  Nails  Reviewed       Diet Order:   Diet Order            Diet clear liquid Room service appropriate? Yes; Fluid consistency: Thin  Diet effective now              EDUCATION NEEDS:   No education needs have been identified at this time  Skin:  Skin Assessment: Skin Integrity Issues: Skin Integrity Issues:: Other (Comment) Other: MASD to buttocks  Last BM:  07/11/18  Height:   Ht Readings from Last 1 Encounters:  07/04/18 _0  (1.626 m)    Weight:   Wt Readings from Last 1 Encounters:  07/11/18 78.2 kg    Ideal Body Weight:  54.5 kg  BMI:  Body mass index is 29.59 kg/m.  Estimated Nutritional Needs:   Kcal:  1700-1900  Protein:  85-100 grams  Fluid:  > 1.7 L  Ronnica Dreese A. Jimmye Norman, RD, LDN, [place] Registered Dietitian II Certified Diabetes Care and Education Specialist Pager: [phone] After hours Pager: [phone]

## 2018-07-11 NOTE — Progress Notes (Signed)
Patient had a fever of 100.1 at start of the shift. MD was made aware and no new actions were taken but to monitor pt. Patient's temp is now 99.3 oral.

## 2018-07-11 NOTE — Plan of Care (Signed)
  Problem: Education: Goal: Knowledge of General Education information will improve Description: Including pain rating scale, medication(s)/side effects and non-pharmacologic comfort measures Outcome: Progressing   Problem: Health Behavior/Discharge Planning: Goal: Ability to manage health-related needs will improve Outcome: Progressing   Problem: Clinical Measurements: Goal: Ability to maintain clinical measurements within normal limits will improve Outcome: Progressing Goal: Will remain free from infection Outcome: Progressing Goal: Diagnostic test results will improve Outcome: Progressing Goal: Respiratory complications will improve Outcome: Progressing Goal: Cardiovascular complication will be avoided Outcome: Progressing   Problem: Activity: Goal: Risk for activity intolerance will decrease Outcome: Progressing   Problem: Nutrition: Goal: Adequate nutrition will be maintained Outcome: Progressing   Problem: Coping: Goal: Level of anxiety will decrease Outcome: Progressing   Problem: Elimination: Goal: Will not experience complications related to bowel motility Outcome: Progressing   Problem: Safety: Goal: Ability to remain free from injury will improve Outcome: Progressing   Problem: Skin Integrity: Goal: Risk for impaired skin integrity will decrease Outcome: Progressing   Problem: Education: Goal: Ability to demonstrate management of disease process will improve Outcome: Progressing Goal: Ability to verbalize understanding of medication therapies will improve Outcome: Progressing Goal: Individualized Educational Video(s) Outcome: Progressing   Problem: Activity: Goal: Capacity to carry out activities will improve Outcome: Progressing   Problem: Cardiac: Goal: Ability to achieve and maintain adequate cardiopulmonary perfusion will improve Outcome: Progressing

## 2018-07-11 NOTE — Progress Notes (Signed)
[facility] PROGRESS NOTE  Heather Oconnor UJW:[id number] DOB: 12/16/1940 DOA: 07/04/2018 PCP: Patient, No Pcp Per  Assessment/Plan: pseudocysts -IV PPI -nasoduodenal tube inserted 07/10/18.  -tube feeds once placement confirmed: RecommendOsmolite1.2 @ 20 ml/hr, advance by 10 ml every 4 hours towards goal rate of 60 ml/hr. Goal rate provides 1728 kcal, 79g protein and 1180ml H2O. -pre-albumin low -evaluated by gastroenterology who opined the following:not sure what caused her pancreatitis last month. She does not have gallstones in her GB however her LFTs were slightly elevated at admission and so it was presumed that she had 'biliary, gallstone' pancreatitis. She does not drink alcohol.  She was taking ACEI/HCTZ and the HCTZ component has been reported to cause pancreatitis. Pancreatitis does not run in her family and she's never had it before.  Would never resume HCTZ. Not sure that she actually has cirrhosis. She has no risk factors and the imaging (US and 2 CT scans) do not prove nodularity to her liver, just that her liver his a bit hyperechoic. Her platelets are normal and her LFTs have largely normalized since this all started.  No INR to gauge coagulopathy.  I have real doubts that she has cirrhosis.Peripancreatic fluid collections are not nearly mature enough to consider treatment.  She has been unable to eat (probably more from anorexia, nausea than mechanical obstruction since she hasn't really been vomiting and no GOO on imaging).  Of utmost importance is optimizing her nutritional status and addressing her very deconditioned body.  She is very likely going to need weeks of tube feed nutrition.  Fever with generalized weakness; anorexia; loose stools; elevated LFT's Max temp 100.2 orally. WBC within limits of normal. blood cultures no growth 2 days. LFTs WNL. CT abdomen/pelvis with pseudocysts.  -tube feeding when tube placement confirmed -Continue gentle IVF hydration,  electrolyte replacement, -symptom control -full liquid diet  Hypokalemia. Resolved this am -repleted -monitor  Chronic diastolic CHFremains compensated.Developed acute CHF with IVF during recent admission -Continue gentle IVF hydration as sodium level 130 -monitor daily wt and I/O's  COPDstable at baseline. No on home oxygen. -Continue Incruse and as-needed albuterol  Type II DMHgA1c 6.7.Managed at home with metformin and glipizide. CBG's trending up since tube feeding started.  -continue to hold home oral agents -SSI- increase to moderate -diabetes coordinator consult for any recommendations  Hypertension BP high end of normal -resume home meds and monitor  hyponatremia sodium 130 this am -resume gentle  IVF  Low B12 -replace and monitor    Code Status: full Family Communication: husband on phone Disposition Plan: to be determined   Consultants:  Dr Christella Hartiganjacobs gastroenterology  Procedures:    Antibiotics:    HPI/Subjective: 78 yo admitted with nausea/vomiting/abdominal pain found to have pseudocyts. Recent hospitalization for pancreatitis of unclear etiology presumably HCTZ.  Gi on board. Cortrack tube 07/10/18. Very low pre-albumin. Anticipate long recovery process  Objective: Vitals:   07/11/18 0819 07/11/18 0837  BP: (!) 146/63   Pulse: 87   Resp: 16   Temp: 99.8 F (37.7 C)   SpO2: 97% 93%    Intake/Output Summary (Last 24 hours) at 07/11/2018 1037 Last data filed at 07/11/2018 0837 Gross per 24 hour  Intake 1154.04 ml  Output 450 ml  Net 704.04 ml   Filed Weights   07/09/18 0459 07/10/18 0439 07/11/18 0441  Weight: 77.8 kg 78 kg 78.2 kg    Exam:   General:  Sitting in chair complained hemorrhoid pain. No acute distress  Cardiovascular: rrr no  mgr no LE edema  Respiratory: normal effort BS clear bilaterally no wheeze  Abdomen: obese soft +bs no guarding or rebounding   Musculoskeletal: joints without  swelling/erythema   Data Reviewed: Basic Metabolic Panel: Recent Labs  Lab 07/05/18 0130  07/06/18 0342 07/07/18 1039 07/08/18 0337 07/10/18 0550 07/11/18 0533  NA 132*   < > 136 135 136 135 131*  K 2.8*   < > 3.4* 3.4* 3.2* 4.0 4.1  CL 88*   < > 93* 92* 94* 94* 94*  CO2 31   < > 33* 31 33* 29 27  GLUCOSE 84   < > 109* 120* 115* 141* 227*  BUN 6*   < > 5* 5* 5* <5* <5*  CREATININE 0.55   < > 0.56 0.54 0.65 0.52 0.53  CALCIUM 8.0*   < > 8.0* 8.3* 8.2* 8.1* 8.0*  MG 1.8  --   --  1.9  --   --  1.7  PHOS  --   --   --  3.1  --   --  2.7   < > = values in this interval not displayed.   Liver Function Tests: Recent Labs  Lab 07/04/18 1524 07/05/18 0130 07/07/18 1039 07/08/18 0337  AST 60* 43* 60* 32  ALT 45* 38 46* 35  ALKPHOS 54 43 50 45  BILITOT 1.1 0.8 0.8 0.7  PROT 6.1* 5.7* 5.6* 5.2*  ALBUMIN 2.1* 2.0* 1.9* 1.7*   Recent Labs  Lab 07/04/18 1524  LIPASE 23   Recent Labs  Lab 07/05/18 0130  AMMONIA 25   CBC: Recent Labs  Lab 07/04/18 1524 07/05/18 0130 07/06/18 0342 07/07/18 1039 07/08/18 0337 07/10/18 0550 07/11/18 0533  WBC 10.6* 6.5 5.9 6.6 7.8 9.1 11.0*  NEUTROABS 8.9* 5.0  --   --   --   --   --   HGB 11.7* 11.0* 10.5* 10.0* 9.9* 10.3* 10.1*  HCT 35.7* 33.2*  32.9* 32.9* 30.8* 31.1* 32.7* 31.8*  MCV 87.1 86.5 88.4 89.3 88.6 89.1 88.8  PLT 301 213 197 214 196 196 201   Cardiac Enzymes: Recent Labs  Lab 07/05/18 0130  CKTOTAL 34*   BNP (last 3 results) No results for input(s): BNP in the last 8760 hours.  ProBNP (last 3 results) No results for input(s): PROBNP in the last 8760 hours.  CBG: Recent Labs  Lab 07/10/18 1625 07/10/18 2019 07/10/18 2345 07/11/18 0525 07/11/18 0802  GLUCAP 120* 129* 172* 208* 218*    Recent Results (from the past 240 hour(s))  Culture, blood (routine x 2)     Status: None   Collection Time: 07/04/18  5:30 PM   Specimen: BLOOD LEFT WRIST  Result Value Ref Range Status   Specimen Description BLOOD  LEFT WRIST  Final   Special Requests   Final    BOTTLES DRAWN AEROBIC ONLY Blood Culture results may not be optimal due to an inadequate volume of blood received in culture bottles   Culture   Final    NO GROWTH 5 DAYS Performed at [facility], [address] [place], [place] [address]    Report Status 07/09/2018 FINAL  Final  Culture, blood (routine x 2)     Status: None   Collection Time: 07/04/18  5:40 PM   Specimen: BLOOD RIGHT HAND  Result Value Ref Range Status   Specimen Description BLOOD RIGHT HAND  Final   Special Requests   Final    BOTTLES DRAWN AEROBIC AND ANAEROBIC  Blood Culture adequate volume   Culture   Final    NO GROWTH 5 DAYS Performed at [facility], [address] [place], [place] [address]    Report Status 07/09/2018 FINAL  Final  SARS Coronavirus 2 (CEPHEID - Performed in [facility] lab), Hosp Order     Status: None   Collection Time: 07/04/18  6:47 PM   Specimen: Nasopharyngeal Swab  Result Value Ref Range Status   SARS Coronavirus 2 NEGATIVE NEGATIVE Final    Comment: (NOTE) If result is NEGATIVE SARS-CoV-2 target nucleic acids are NOT DETECTED. The SARS-CoV-2 RNA is generally detectable in upper and lower  respiratory specimens during the acute phase of infection. The lowest  concentration of SARS-CoV-2 viral copies this assay can detect is 250  copies / mL. A negative result does not preclude SARS-CoV-2 infection  and should not be used as the sole basis for treatment or other  patient management decisions.  A negative result may occur with  improper specimen collection / handling, submission of specimen other  than nasopharyngeal swab, presence of viral mutation(s) within the  areas targeted by this assay, and inadequate number of viral copies  (<250 copies / mL). A negative result must be combined with clinical  observations, patient history, and epidemiological information. If result is POSITIVE SARS-CoV-2 target nucleic  acids are DETECTED. The SARS-CoV-2 RNA is generally detectable in upper and lower  respiratory specimens dur ing the acute phase of infection.  Positive  results are indicative of active infection with SARS-CoV-2.  Clinical  correlation with patient history and other diagnostic information is  necessary to determine patient infection status.  Positive results do  not rule out bacterial infection or co-infection with other viruses. If result is PRESUMPTIVE POSTIVE SARS-CoV-2 nucleic acids MAY BE PRESENT.   A presumptive positive result was obtained on the submitted specimen  and confirmed on repeat testing.  While 2019 novel coronavirus  (SARS-CoV-2) nucleic acids may be present in the submitted sample  additional confirmatory testing may be necessary for epidemiological  and / or clinical management purposes  to differentiate between  SARS-CoV-2 and other Sarbecovirus currently known to infect humans.  If clinically indicated additional testing with an alternate test  methodology [phone]([id number]) is advised. The SARS-CoV-2 RNA is generally  detectable in upper and lower respiratory sp ecimens during the acute  phase of infection. The expected result is Negative. Fact Sheet for Patients:  BoilerBrush.com.cyhttps://www.fda.gov/media/136312/download Fact Sheet for Healthcare Providers: https://pope.com/https://www.fda.gov/media/136313/download This test is not yet approved or cleared by the [place] FDA and has been authorized for detection and/or diagnosis of SARS-CoV-2 by FDA under an Emergency Use Authorization (EUA).  This EUA will remain in effect (meaning this test can be used) for the duration of the COVID-19 declaration under Section 564(b)(1) of the Act, 21 U.S.C. section 360bbb-3(b)(1), unless the authorization is terminated or revoked sooner. Performed at [facility], [address] [place], [place] [address]      Studies: Dg Abd Portable 1v  Result Date: 07/10/2018 CLINICAL DATA:  78 year old  female with history of pancreatitis, status post feeding tube placement. EXAM: PORTABLE ABDOMEN - 1 VIEW COMPARISON:  CT of the abdomen pelvis dated 07/06/2018 FINDINGS: A feeding tube is partially visualized with tip to the right of the spine likely in the distal stomach. No dilatation of the small bowel. Air is noted in the colon. No free air identified on the provided image. Left lung base consolidative changes partially  visualized. There is atherosclerotic calcification of the abdominal aorta and iliac arteries. Degenerative changes of the spine. No acute osseous pathology. IMPRESSION: Feeding tube with tip likely in the distal stomach. Electronically Signed   By: Anner Crete M.D.   On: 07/10/2018 10:43    Scheduled Meds: . amLODipine  10 mg Per Tube Daily  . dorzolamide-timolol  1 drop Right Eye Daily  . enoxaparin (LOVENOX) injection  40 mg Subcutaneous Q24H  . hydrocortisone cream   Topical QID  . insulin aspart  0-15 Units Subcutaneous Q4H  . latanoprost  1 drop Right Eye QHS  . mouth rinse  15 mL Mouth Rinse BID  . metoprolol tartrate  25 mg Per Tube BID  . pantoprazole sodium  40 mg Per Tube Daily  . sodium chloride flush  3 mL Intravenous Q12H  . umeclidinium bromide  1 puff Inhalation Daily  . vitamin B-12  1,000 mcg Per Tube Daily   Continuous Infusions: . feeding supplement (OSMOLITE 1.2 CAL) 1,000 mL (07/11/18 0224)  . magnesium sulfate bolus IVPB      Principal Problem:   Abdominal pain, vomiting, and diarrhea Active Problems:   Elevated LFTs/Needs Liver Biopsy   Chronic diastolic CHF (congestive heart failure) (HCC)   Hyponatremia   Hypoxia   Pseudocyst of pancreas   Essential hypertension   Type 2 diabetes mellitus without complication (HCC)   COPD (chronic obstructive pulmonary disease) (HCC)   B12 deficiency   Abdominal pain    Time spent: 40 minutes    Burke NP  [facility]  If 7PM-7AM, please contact night-coverage at  [email or web address], password [facility] 07/11/2018, 10:37 AM  LOS: 6 days

## 2018-07-11 NOTE — Care Management Important Message (Signed)
Important Message  Patient Details  Name: SRAH AKE MRN: [id number] Date of Birth: 01-09-1940   Medicare Important Message Given:  Yes     Shelda Altes 07/11/2018, 12:49 PM

## 2018-07-11 NOTE — Evaluation (Signed)
Physical Therapy Evaluation Patient Details Name: Heather Oconnor MRN: [id number] DOB: 10/10/1940 Today's Date: 07/11/2018   History of Present Illness  78 y.o. female admitted with weakness, diarrhea, pseudocysts. PMhx: DM, HTN, GERD, IBS, chronic diastolic CHF, and COPD  Clinical Impression  Pt pleasant and eager to be able to get OOB without Cortrak getting tangled. Pt with decreased activity tolerance, balance and gait who will benefit from acute therapy to maximize mobility, independence and function to decrease burden of care. Pt with SpO2 87% on RA with return to 2L throughout session with SpO2 90% with gait, HR 108. Pt frustrated with additional lines and sacral excoriation but very willing to improve to be able to return home with spouse.      Follow Up Recommendations Home health PT;Supervision for mobility/OOB    Equipment Recommendations  Rolling walker with 5" wheels;3in1 (PT)    Recommendations for Other Services       Precautions / Restrictions Precautions Precautions: Fall Precaution Comments: cortrak, sacral excoriation Restrictions Weight Bearing Restrictions: No      Mobility  Bed Mobility Overal bed mobility: Needs Assistance Bed Mobility: Supine to Sit     Supine to sit: Min guard     General bed mobility comments: pt able to exit bed with HOB 30 degrees with rail  Transfers Overall transfer level: Needs assistance Equipment used: Rolling walker (2 wheeled) Transfers: Sit to/from [employer];Stand Pivot Transfers Sit to Stand: Min guard Stand pivot transfers: Min guard       General transfer comment: Pt able to stand from bed and BSC with cues for safety and hand placement. pivot [facility] to [facility] without physical assist  Ambulation/Gait Ambulation/Gait assistance: Min guard Gait Distance (Feet): 150 Feet Assistive device: Rolling walker (2 wheeled) Gait Pattern/deviations: Decreased stride length;Step-through pattern;Trunk flexed   Gait velocity  interpretation: 1.31 - 2.62 ft/sec, indicative of limited community ambulator General Gait Details: pt with slow steady gait with cues for posture and proximity to RW with pt limited by fatigue  Stairs            Wheelchair Mobility    Modified Rankin (Stroke Patients Only)       Balance Overall balance assessment: Needs assistance Sitting-balance support: Feet supported;No upper extremity supported Sitting balance-Leahy Scale: Good     Standing balance support: During functional activity;Bilateral upper extremity supported Standing balance-Leahy Scale: Poor Standing balance comment: required bil UE on RW for assist for pericare                             Pertinent Vitals/Pain Pain Assessment: No/denies pain    Home Living Family/patient expects to be discharged to:: Private residence Living Arrangements: Spouse/significant other Available Help at Discharge: Family;Friend(s);Available 24 hours/day Type of Home: House Home Access: Stairs to enter Entrance Stairs-Rails: None Entrance Stairs-Number of Steps: 1 Home Layout: One level Home Equipment: None      Prior Function Level of Independence: Independent               Hand Dominance        Extremity/Trunk Assessment   Upper Extremity Assessment Upper Extremity Assessment: Generalized weakness    Lower Extremity Assessment Lower Extremity Assessment: Generalized weakness    Cervical / Trunk Assessment Cervical / Trunk Assessment: Kyphotic  Communication   Communication: No difficulties  Cognition Arousal/Alertness: Awake/alert Behavior During Therapy: WFL for tasks assessed/performed Overall Cognitive Status: Within Functional Limits for tasks assessed  General Comments      Exercises     Assessment/Plan    PT Assessment Patient needs continued PT services  PT Problem List Decreased strength;Decreased activity  tolerance;Decreased balance;Decreased mobility;Decreased knowledge of use of DME;Decreased safety awareness       PT Treatment Interventions DME instruction;Gait training;Functional mobility training;Therapeutic activities;Therapeutic exercise;Balance training;Patient/family education;Stair training    PT Goals (Current goals can be found in the Care Plan section)  Acute Rehab PT Goals Patient Stated Goal: return home and play games PT Goal Formulation: With patient Time For Goal Achievement: 07/25/18 Potential to Achieve Goals: Good    Frequency Min 3X/week   Barriers to discharge        Co-evaluation               AM-PAC PT "6 Clicks" Mobility  Outcome Measure Help needed turning from your back to your side while in a flat bed without using bedrails?: A Little Help needed moving from lying on your back to sitting on the side of a flat bed without using bedrails?: A Little Help needed moving to and from a bed to a chair (including a wheelchair)?: A Little Help needed standing up from a chair using your arms (e.g., wheelchair or bedside chair)?: A Little Help needed to walk in hospital room?: A Little Help needed climbing 3-5 steps with a railing? : A Lot 6 Click Score: 17    End of Session Equipment Utilized During Treatment: Gait belt;Oxygen Activity Tolerance: Patient tolerated treatment well Patient left: in chair;with call bell/phone within reach;with chair alarm set Nurse Communication: Mobility status PT Visit Diagnosis: Other abnormalities of gait and mobility (R26.89);Muscle weakness (generalized) (M62.81)    Time: 0865-7846 PT Time Calculation (min) (ACUTE ONLY): 24 min   Charges:   PT Evaluation $PT Eval Moderate Complexity: 1 Mod PT Treatments $Gait Training: 8-22 mins        Heather Oconnor, PT Acute Rehabilitation Services Pager: [phone] Office: [place] 07/11/2018, 12:33 PM

## 2018-07-12 DIAGNOSIS — E119 Type 2 diabetes mellitus without complications: Secondary | ICD-10-CM

## 2018-07-12 LAB — BASIC METABOLIC PANEL
Anion gap: 8 (ref 5–15)
BUN: 5 mg/dL — ABNORMAL LOW (ref 8–23)
CO2: 28 mmol/L (ref 22–32)
Calcium: 7.7 mg/dL — ABNORMAL LOW (ref 8.9–10.3)
Chloride: 95 mmol/L — ABNORMAL LOW (ref 98–111)
Creatinine, Ser: 0.66 mg/dL (ref 0.44–1.00)
GFR calc Af Amer: >60 mL/min (ref >60)
GFR calc non Af Amer: >60 mL/min (ref >60)
Glucose, Bld: 189 mg/dL — ABNORMAL HIGH (ref 70–99)
Potassium: 4.3 mmol/L (ref 3.5–5.1)
Sodium: 131 mmol/L — ABNORMAL LOW (ref 135–145)

## 2018-07-12 LAB — CBC
HCT: 30.5 % — ABNORMAL LOW (ref 36.0–46.0)
Hemoglobin: 9.5 g/dL — ABNORMAL LOW (ref 12.0–15.0)
MCH: 27.6 pg (ref 26.0–34.0)
MCHC: 31.1 g/dL (ref 30.0–36.0)
MCV: 88.7 fL (ref 80.0–100.0)
Platelets: 180 10*3/uL (ref 150–400)
RBC: 3.44 MIL/uL — ABNORMAL LOW (ref 3.87–5.11)
RDW: 14 % (ref 11.5–15.5)
WBC: 10 10*3/uL (ref 4.0–10.5)
nRBC: 0.2 % (ref 0.0–0.2)

## 2018-07-12 LAB — GLUCOSE, CAPILLARY
Glucose-Capillary: 190 mg/dL — ABNORMAL HIGH (ref 70–99)
Glucose-Capillary: 201 mg/dL — ABNORMAL HIGH (ref 70–99)
Glucose-Capillary: 230 mg/dL — ABNORMAL HIGH (ref 70–99)
Glucose-Capillary: 209 mg/dL — ABNORMAL HIGH (ref 70–99)
Glucose-Capillary: 194 mg/dL — ABNORMAL HIGH (ref 70–99)

## 2018-07-12 MED ORDER — INSULIN ASPART 100 UNIT/ML ~~LOC~~ SOLN
3.0000 [IU] | SUBCUTANEOUS | Status: DC
  Administered 2018-07-12 – 2018-07-14 (×12): 3 [IU] via SUBCUTANEOUS

## 2018-07-12 NOTE — Evaluation (Signed)
Occupational Therapy Evaluation Patient Details Name: Heather Oconnor MRN: [id number] DOB: 07/04/1940 Today's Date: 07/12/2018    History of Present Illness 78 y.o. female admitted with weakness, diarrhea, pseudocysts. PMhx: DM, HTN, GERD, IBS, chronic diastolic CHF, and COPD   Clinical Impression   PTA patient independent and driving. Admitted for above and limited by problem list below, including impaired balance, decreased activity tolerance, generalized weakness.  She currently requires min guard for transfers and mobility using RW, min guard for grooming standing at sink, min assist for LB ADLs.  She is on 2L supplemental oxygen upon entry, removed seated EOB and saturations dropped to 86% during LB ADLs (donning/doffing socks), replaced oxygen and saturations maintained 90-91% on 2L, HR up to 105.  Patient will benefit from continued OT services while admitted and after dc at [facility] level in order to maximize independence and safety with ADLs/mobility.      Follow Up Recommendations  Home health OT;Supervision/Assistance - 24 hour    Equipment Recommendations  3 in 1 bedside commode    Recommendations for Other Services       Precautions / Restrictions Precautions Precautions: Fall Precaution Comments: cortrak, sacral excoriation Restrictions Weight Bearing Restrictions: No      Mobility Bed Mobility Overal bed mobility: Needs Assistance Bed Mobility: Supine to Sit     Supine to sit: Min guard     General bed mobility comments: min guard for safety and balance, no phyiscal assist  Transfers Overall transfer level: Needs assistance Equipment used: Rolling walker (2 wheeled) Transfers: Sit to/from Stand Sit to Stand: Min guard         General transfer comment: min guard for safety/balance, cueing for hand placement     Balance Overall balance assessment: Needs assistance Sitting-balance support: No upper extremity supported;Feet supported Sitting balance-Leahy  Scale: Good     Standing balance support: Bilateral upper extremity supported;During functional activity;No upper extremity supported Standing balance-Leahy Scale: Poor Standing balance comment: able to engage in grooming without UE support, but preference to support (leaning forward at sink); dynamically reliant on B UE support                           ADL either performed or assessed with clinical judgement   ADL Overall ADL's : Needs assistance/impaired     Grooming: Min guard;Standing;Wash/dry face   Upper Body Bathing: Set up;Sitting;Supervision/ safety   Lower Body Bathing: Minimal assistance;Sit to/from stand Lower Body Bathing Details (indicate cue type and reason): figure 4 technique to reach feet, min guard sit to stand; decreased act tolerance  Upper Body Dressing : Set up;Sitting;Supervision/safety   Lower Body Dressing: Minimal assistance;Sit to/from stand Lower Body Dressing Details (indicate cue type and reason): figure 4 technique to don/doff socks, min guard sit to stand; decreased act tolerance  Toilet Transfer: RW;Min guard;Ambulation Toilet Transfer Details (indicate cue type and reason): simulated to recliner          Functional mobility during ADLs: Min guard;Rolling walker General ADL Comments: pt limited by decreased act tolerance, generalized weakness, and pain      Vision         Perception     Praxis      Pertinent Vitals/Pain Pain Assessment: Faces Faces Pain Scale: Hurts little more Pain Location: buttocks  Pain Descriptors / Indicators: Discomfort Pain Intervention(s): Monitored during session;Repositioned     Hand Dominance Right   Extremity/Trunk Assessment Upper Extremity Assessment Upper Extremity Assessment:  Generalized weakness   Lower Extremity Assessment Lower Extremity Assessment: Defer to PT evaluation   Cervical / Trunk Assessment Cervical / Trunk Assessment: Kyphotic   Communication  Communication Communication: No difficulties   Cognition Arousal/Alertness: Awake/alert Behavior During Therapy: WFL for tasks assessed/performed Overall Cognitive Status: Within Functional Limits for tasks assessed                                     General Comments  on 2L supplemental oxgyen via  removed and desat to 86% while engaging in LB dressing; reapplied and maintained 90-91 during mobility; HR up to 105    Exercises     Shoulder Instructions      Home Living Family/patient expects to be discharged to:: Private residence Living Arrangements: Spouse/significant other Available Help at Discharge: Family;Friend(s);Available 24 hours/day Type of Home: House Home Access: Stairs to enter Entergy CorporationEntrance Stairs-Number of Steps: 1 Entrance Stairs-Rails: None Home Layout: One level     Bathroom Shower/Tub: Chief Strategy OfficerTub/shower unit   Bathroom Toilet: Standard     Home Equipment: None          Prior Functioning/Environment Level of Independence: Independent        Comments: reports independent with mobility, ADLs, IADLs and driving         OT Problem List: Decreased strength;Decreased activity tolerance;Impaired balance (sitting and/or standing);Decreased knowledge of use of DME or AE;Decreased safety awareness;Decreased knowledge of precautions;Cardiopulmonary status limiting activity;Pain      OT Treatment/Interventions: Self-care/ADL training;Energy conservation;DME and/or AE instruction;Therapeutic exercise;Therapeutic activities;Patient/family education;Balance training    OT Goals(Current goals can be found in the care plan section) Acute Rehab OT Goals Patient Stated Goal: return home and play games OT Goal Formulation: With patient Time For Goal Achievement: 07/26/18 Potential to Achieve Goals: Good  OT Frequency: Min 2X/week   Barriers to D/C:            Co-evaluation              AM-PAC OT "6 Clicks" Daily Activity     Outcome Measure  Help from another person eating meals?: A Little(thin clear liquids only) Help from another person taking care of personal grooming?: A Little Help from another person toileting, which includes using toliet, bedpan, or urinal?: A Little Help from another person bathing (including washing, rinsing, drying)?: A Little Help from another person to put on and taking off regular upper body clothing?: A Little Help from another person to put on and taking off regular lower body clothing?: A Little 6 Click Score: 18   End of Session Equipment Utilized During Treatment: Gait belt;Rolling walker;Oxygen Nurse Communication: Mobility status  Activity Tolerance: Patient tolerated treatment well Patient left: in chair;with call bell/phone within reach;with chair alarm set;with nursing/sitter in room  OT Visit Diagnosis: Muscle weakness (generalized) (M62.81);Unsteadiness on feet (R26.81);Pain Pain - part of body: (buttocks)                Time: [id number] OT Time Calculation (min): 27 min Charges:  OT General Charges $OT Visit: 1 Visit OT Evaluation $OT Eval Moderate Complexity: 1 Mod OT Treatments $Self Care/Home Management : 8-22 mins  Chancy Milroyhristie S Lorik Guo, OT Acute Rehabilitation Services Pager [phone] Office [phone]   Chancy MilroyChristie S Azusena Erlandson 07/12/2018, 9:23 AM

## 2018-07-12 NOTE — Progress Notes (Signed)
Inpatient Diabetes Program Recommendations  AACE/ADA: New Consensus Statement on Inpatient Glycemic Control (2015)  Target Ranges:  Prepandial:   less than 140 mg/dL      Peak postprandial:   less than 180 mg/dL (1-2 hours)      Critically ill patients:  140 - 180 mg/dL   Lab Results  Component Value Date   GLUCAP 230 (H) 07/12/2018   HGBA1C 6.7 (H) 07/05/2018    Review of Glycemic Control Results for Oconnor, Heather (MRN [id number]) as of 07/12/2018 13:57  Ref. Range 07/11/2018 23:52 07/12/2018 03:28 07/12/2018 07:33 07/12/2018 12:17  Glucose-Capillary Latest Ref Range: 70 - 99 mg/dL 198 (H) 190 (H) 201 (H) 230 (H)   Diabetes history: Type 2 DM Outpatient Diabetes medications: Glipizide 5 mg QD, Metformin 500 mg BID Current orders for Inpatient glycemic control: Novolog 0-15 units Q4H  Inpatient Diabetes Program Recommendations:    Noted consult and start of tube feeds. Slightly increased above inpatient goals of 180 mg/dL.  Could consider starting Novolog 3 units Q4H (to be stopped or held if tube feeds are stopped).   Thanks, Bronson Curb, MSN, RNC-OB Diabetes Coordinator [phone] (8a-5p)

## 2018-07-12 NOTE — Plan of Care (Signed)
  Problem: Safety: Goal: Ability to remain free from injury will improve Outcome: Progressing   

## 2018-07-12 NOTE — Plan of Care (Signed)
  Problem: Education: Goal: Knowledge of General Education information will improve Description: Including pain rating scale, medication(s)/side effects and non-pharmacologic comfort measures Outcome: Progressing   Problem: Health Behavior/Discharge Planning: Goal: Ability to manage health-related needs will improve Outcome: Progressing   Problem: Clinical Measurements: Goal: Ability to maintain clinical measurements within normal limits will improve Outcome: Progressing Goal: Will remain free from infection Outcome: Progressing Goal: Diagnostic test results will improve Outcome: Progressing Goal: Respiratory complications will improve Outcome: Progressing Goal: Cardiovascular complication will be avoided Outcome: Progressing   Problem: Activity: Goal: Risk for activity intolerance will decrease Outcome: Progressing   Problem: Nutrition: Goal: Adequate nutrition will be maintained Outcome: Progressing   Problem: Coping: Goal: Level of anxiety will decrease Outcome: Progressing   Problem: Elimination: Goal: Will not experience complications related to bowel motility Outcome: Progressing   Problem: Safety: Goal: Ability to remain free from injury will improve Outcome: Progressing   Problem: Skin Integrity: Goal: Risk for impaired skin integrity will decrease Outcome: Progressing   Problem: Education: Goal: Ability to demonstrate management of disease process will improve Outcome: Progressing Goal: Ability to verbalize understanding of medication therapies will improve Outcome: Progressing Goal: Individualized Educational Video(s) Outcome: Progressing   Problem: Activity: Goal: Capacity to carry out activities will improve Outcome: Progressing   Problem: Cardiac: Goal: Ability to achieve and maintain adequate cardiopulmonary perfusion will improve Outcome: Progressing

## 2018-07-12 NOTE — Progress Notes (Addendum)
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  A & P  Pancreatic necrosis (walled off), with evolving pseudocysts (3 fluid collections); secondary to acute pancreatitis, admitted 6/8-6/14. --Continue management per GI  Nutrition --continue TF through Cortrak as per Dr. Ardis Hughs, feeding tube in stomach but per GI this is okay.  Patient appears to be tolerating well.  Diet per GI.  Cirrhosis of liver? --Follow-up with GI as an outpatient  Diabetes mellitus type 2 --somewhat elevated given tube feeds --Start NovoLog 3 units every 4 while on tube feeds, to be held if tube feeds stopped  Chronic diastolic CHF --Stable  COPD --Stable   Continue management per GI  DVT prophylaxis: enoxaparin Code Status: Full Family Communication: none Disposition Plan: Home with home health PT   Murray Hodgkins, MD  [facility] Direct contact: see [email or web address] (further directions at bottom of note if needed) 7PM-7AM contact night coverage as at bottom of note 07/12/2018, 7:06 PM  LOS: 7 days   Consultants  . Gastroenterology  Procedures  .   Antibiotics  .   Interval History/Subjective  No nausea or vomiting, no pain.  Would like diet advanced.  Objective   Vitals:  Vitals:   07/12/18 0812 07/12/18 1321  BP: (!) 166/70 (!) 160/64  Pulse: 98 100  Resp: 20 19  Temp: 99.1 F (37.3 C) 98.3 F (36.8 C)  SpO2: 95% (!) 89%    Exam:  Constitutional:  . Appears calm and comfortable Respiratory:  . CTA bilaterally, no w/r/r.  . Respiratory effort normal.  Cardiovascular:  . RRR, no m/r/g . No LE extremity edema   Abdomen:  . soft, nontender Psychiatric:  . Mental status o Mood, affect appropriate  I have personally reviewed the following:   Today's Data  . Blood sugar  stable . BMP unremarkable, sodium stable at 131 . Hemoglobin stable 9.5  Lab Data  .   Micro Data  .   Imaging  .   Cardiology Data  .   Other Data  .      Scheduled Meds: . amLODipine  10 mg Per Tube Daily  . dorzolamide-timolol  1 drop Right Eye Daily  . enoxaparin (LOVENOX) injection  40 mg Subcutaneous Q24H  . hydrocortisone cream   Topical QID  . insulin aspart  0-15 Units Subcutaneous Q4H  . insulin aspart  3 Units Subcutaneous Q4H  . latanoprost  1 drop Right Eye QHS  . mouth rinse  15 mL Mouth Rinse BID  . metoprolol tartrate  25 mg Per Tube BID  . pantoprazole sodium  40 mg Per Tube Daily  . sodium chloride flush  3 mL Intravenous Q12H  . umeclidinium bromide  1 puff Inhalation Daily  . vitamin B-12  1,000 mcg Per Tube Daily   Continuous Infusions: . feeding supplement (OSMOLITE 1.2 CAL) 1,000 mL (07/12/18 0854)    Principal Problem:   Pseudocyst of pancreas Active Problems:   Essential hypertension   Type 2 diabetes mellitus without complication (HCC)   Elevated LFTs/Needs Liver Biopsy   Abdominal pain, vomiting, and diarrhea   Chronic diastolic CHF (congestive heart failure) (HCC)   Hyponatremia   COPD (chronic obstructive pulmonary disease) ([place])   Hypoxia   LOS: 7 days   How to contact the [facility] Attending or Consulting provider 7A - 7P  or covering provider during after hours 7P -7A, for this patient?  1. Check the care team in [facility] and look for a) attending/consulting TRH provider listed and b) the [facility] team listed 2. Log into [email or web address] and use [place]'s universal password to access. If you do not have the password, please contact the hospital operator. 3. Locate the [facility] provider you are looking for under [facility] and page to a number that you can be directly reached. 4. If you still have difficulty reaching the provider, please page the [facility] (Director on Call) for the Hospitalists listed on amion for assistance.

## 2018-07-13 LAB — BASIC METABOLIC PANEL
Anion gap: 9 (ref 5–15)
BUN: 6 mg/dL — ABNORMAL LOW (ref 8–23)
CO2: 28 mmol/L (ref 22–32)
Calcium: 7.8 mg/dL — ABNORMAL LOW (ref 8.9–10.3)
Chloride: 96 mmol/L — ABNORMAL LOW (ref 98–111)
Creatinine, Ser: 0.4 mg/dL — ABNORMAL LOW (ref 0.44–1.00)
GFR calc Af Amer: >60 mL/min (ref >60)
GFR calc non Af Amer: >60 mL/min (ref >60)
Glucose, Bld: 179 mg/dL — ABNORMAL HIGH (ref 70–99)
Potassium: 4.4 mmol/L (ref 3.5–5.1)
Sodium: 133 mmol/L — ABNORMAL LOW (ref 135–145)

## 2018-07-13 LAB — GLUCOSE, CAPILLARY
Glucose-Capillary: 137 mg/dL — ABNORMAL HIGH (ref 70–99)
Glucose-Capillary: 153 mg/dL — ABNORMAL HIGH (ref 70–99)
Glucose-Capillary: 157 mg/dL — ABNORMAL HIGH (ref 70–99)
Glucose-Capillary: 165 mg/dL — ABNORMAL HIGH (ref 70–99)
Glucose-Capillary: 178 mg/dL — ABNORMAL HIGH (ref 70–99)
Glucose-Capillary: 184 mg/dL — ABNORMAL HIGH (ref 70–99)
Glucose-Capillary: 220 mg/dL — ABNORMAL HIGH (ref 70–99)

## 2018-07-13 MED ORDER — FUROSEMIDE 10 MG/ML IJ SOLN
20.0000 mg | Freq: Every day | INTRAMUSCULAR | Status: DC
  Administered 2018-07-13 – 2018-07-14 (×2): 20 mg via INTRAVENOUS
  Filled 2018-07-13 (×2): qty 2

## 2018-07-13 NOTE — Progress Notes (Signed)
Daily Rounding Note  07/13/2018, 1:57 PM  LOS: 8 days   SUBJECTIVE:   Chief complaint: recent acute pancreatitis, now with pancreatic necrosis and evolving fluid collections causing pain, anorexia.       Tolerating clears.  No pain or N/V.  TF at goal rate 65/hour.  Stools loose, as always (problem for several years) Asking when she can/will go home.  Would like to have more variety in diet, tired of clear liquids.   Note oxygen desats with walking.  PT says will need home oxygen at discharge.    OBJECTIVE:         Vital signs in last 24 hours:    Temp:  [98.1 F (36.7 C)] 98.1 F (36.7 C) (07/09 1119) Pulse Rate:  [87-91] 91 (07/09 1119) Resp:  [16] 16 (07/09 1119) BP: (144-153)/(62-72) 153/62 (07/09 1119) SpO2:  [98 %] 98 % (07/09 1119) Weight:  [80.2 kg] 80.2 kg (07/09 0300) Last BM Date: 07/11/18 Filed Weights   07/11/18 0441 07/12/18 0130 07/13/18 0300  Weight: 78.2 kg 78.6 kg 80.2 kg   General: looks better, still looks unwell.  Coretrack FT in place Heart: RRR Chest: clear bil.  No dyspnea or cough Abdomen: soft, fullness in upper abdomen.  Not tender or protuberant.  BS active.    Extremities: no CCE Neuro/Psych:  Oriented x 3.  Follows commands.    Intake/Output from previous day: 07/08 0701 - 07/09 0700 In: 780 [P.O.:780] Out: 700 [Urine:700]  Intake/Output this shift: Total I/O In: 360 [P.O.:360] Out: -   Lab Results: Recent Labs    07/11/18 0533 07/12/18 0504  WBC 11.0* 10.0  HGB 10.1* 9.5*  HCT 31.8* 30.5*  PLT 201 180   BMET Recent Labs    07/11/18 0533 07/12/18 0504 07/13/18 0508  NA 131* 131* 133*  K 4.1 4.3 4.4  CL 94* 95* 96*  CO2 27 28 28   GLUCOSE 227* 189* 179*  BUN <5* 5* 6*  CREATININE 0.53 0.66 0.40*  CALCIUM 8.0* 7.7* 7.8*   LFT No results for input(s): PROT, ALBUMIN, AST, ALT, ALKPHOS, BILITOT, BILIDIR, IBILI in the last 72 hours. PT/INR Recent Labs   07/11/18 0533  LABPROT 14.8  INR 1.2   Hepatitis Panel No results for input(s): HEPBSAG, HCVAB, HEPAIGM, HEPBIGM in the last 72 hours.  Studies/Results: No results found.  ASSESMENT:   *    Walled off pancreatic necrosis, evolving pseudocysts (3 fluid collections).  This causing mass effect on stomach and anorexia, wt loss.    Acute,  pancreatitis dx with admission 6/8 -6/14. NO gallstone and no CBD stones on imaging. ? If pancreatitis due to microlithiasis?  Future Lap chole and liver bx recommended after convalescence; planned office fup with Dr Curlene Labrum (who saw pt at [facility]).   sxs of abd pain improved. Would like to advance her diet.     *   Protein malnutrition in setting of diminished po intake.   Tolerating TF via coretrack duodenal tube at goal rate 65/hour and additional clear liquids.    *   ? Cirrhosis of liver, ascites per ultrasound, CT in 06/2018.  Liver biopsy planned at time of lap chole.    *   Normocytic anemia.    *   DM 2.    *   IBS-D, hx fecal seepage.  Last colonoscopy was 2009: hemorrhoids, long redundant colon, L diverticulae.    PLAN   *  Advance  to Full Liquids.    *  ? Timing of repeat CT for pancreatic reassessment?   *  ? Switch from Protonix via tube to PO?  Will wait to see if she tolerates FL, if yes: can switch to po meds.     *  Will not see pt daily.  But GI available on any day, just call.  Thanks.      Jennye MoccasinSarah Meiko Stranahan  07/13/2018, 1:57 PM Phone [phone]

## 2018-07-13 NOTE — Progress Notes (Signed)
PROGRESS NOTE  [name] SWF:[id number] DOB: 04-Jul-1940 DOA: 07/04/2018 PCP: Patient, No Pcp Per  Brief History   78 year old woman status post bout of pancreatitis presented with generalized weakness, anorexia.  She failed to improve and so further imaging was pursued which revealed 3 large collections consistent with pseudocyst and walled off pancreatic necrosis.  A & P  Pancreatic necrosis (walled off), with evolving pseudocysts (3 fluid collections); secondary to acute pancreatitis, admitted 6/8-6/14.  There was concern at that time for microlithiasis as well as cirrhosis based on imaging and consideration was given to outpatient laparoscopic cholecystectomy with liver biopsy. --Slow to improve but is tolerating liquids and has no pain.  GI plans to advance diet.  Nutrition --Continue TF through Cortrak as per Dr. Ardis Hughs, feeding tube in stomach but per GI this is okay.  Appears to be tolerating well.  Diet per GI.  Cirrhosis of liver? --Follow-up with GI as an outpatient is planned  Diabetes mellitus type 2, hemoglobin A1c 6.7 --remains stable.  Continue scheduled and sliding scale NovoLog with tube feeds.  Chronic diastolic CHF --Remains stable  COPD, CT chest consistent with emphysema.  Had acute hypoxic respiratory failure last hospitalization in June.  Thought secondary to underlying emphysema and acute on chronic CHF at that time. --Remains stable  TSH was slightly low on admission 0.308 --Likely of no clinical significance.  Will check free T4.  Suggest repeat testing once patient has recovered.  Aortic atherosclerosis   Continue management per GI  DVT prophylaxis: enoxaparin Code Status: Full Family Communication: none Disposition Plan: Home with home health PT   Murray Hodgkins, MD  [facility] Direct contact: see [email or web address] (further directions at bottom of note if needed) 7PM-7AM contact night coverage as at bottom of note 07/13/2018, 12:56 PM   LOS: 8 days   Consultants  . Gastroenterology  Procedures  . 7/6 Cortrak tube placement  Antibiotics  .   Interval History/Subjective  Feels well, no pain, tolerating liquids.  Questions about diet and prognosis.  Objective   Vitals:  Vitals:   07/13/18 1111 07/13/18 1119  BP: (!) 153/62 (!) 153/62  Pulse: 87 91  Resp:  16  Temp:  98.1 F (36.7 C)  SpO2:  98%    Exam:  Constitutional:   . Appears calm and comfortable Respiratory:  . CTA bilaterally, no w/r/r.  . Respiratory effort normal.  Cardiovascular:  . RRR, no m/r/g . No LE extremity edema   Abdomen:  . Soft, nontender Psychiatric:  . Mental status o Mood, affect appropriate  I have personally reviewed the following:   Today's Data  . Basic metabolic panel unremarkable . CBG stable  Lab Data  .   Micro Data  .   Imaging  . Chest x-ray 7/4 showed cardiomegaly possible interstitial edema . CT abdomen and pelvis noted  Cardiology Data  . EKG sinus rhythm  Other Data  .      Scheduled Meds: . amLODipine  10 mg Per Tube Daily  . dorzolamide-timolol  1 drop Right Eye Daily  . enoxaparin (LOVENOX) injection  40 mg Subcutaneous Q24H  . hydrocortisone cream   Topical QID  . insulin aspart  0-15 Units Subcutaneous Q4H  . insulin aspart  3 Units Subcutaneous Q4H  . latanoprost  1 drop Right Eye QHS  . mouth rinse  15 mL Mouth Rinse BID  . metoprolol tartrate  25 mg Per Tube BID  . pantoprazole sodium  40 mg Per Tube Daily  .  sodium chloride flush  3 mL Intravenous Q12H  . umeclidinium bromide  1 puff Inhalation Daily  . vitamin B-12  1,000 mcg Per Tube Daily   Continuous Infusions: . feeding supplement (OSMOLITE 1.2 CAL) 1,000 mL (07/13/18 0057)    Principal Problem:   Pseudocyst of pancreas Active Problems:   Essential hypertension   Type 2 diabetes mellitus without complication (HCC)   Elevated LFTs/Needs Liver Biopsy   Abdominal pain, vomiting, and diarrhea   Chronic  diastolic CHF (congestive heart failure) (HCC)   Hyponatremia   COPD (chronic obstructive pulmonary disease) (HCC)   Hypoxia   LOS: 8 days   How to contact the [facility] Attending or Consulting provider 7A - 7P or covering provider during after hours 7P -7A, for this patient?  1. Check the care team in [facility] and look for a) attending/consulting TRH provider listed and b) the [facility] team listed 2. Log into [email or web address] and use [place]'s universal password to access. If you do not have the password, please contact the hospital operator. 3. Locate the [facility] provider you are looking for under [facility] and page to a number that you can be directly reached. 4. If you still have difficulty reaching the provider, please page the [facility] (Director on Call) for the Hospitalists listed on amion for assistance.

## 2018-07-13 NOTE — Progress Notes (Signed)
Nutrition Follow-up  DOCUMENTATION CODES:   Not applicable  INTERVENTION:   -Continue Osmolite 1.2 @ 65 ml/hr via cortrak tube  Tube feeding regimen provides1872kcal (100% of needs),87grams of protein, and 1258m of H2O.   -RD will follow for diet advancement   NUTRITION DIAGNOSIS:   Inadequate oral intake related to nausea, poor appetite as evidenced by per patient/family report.  Ongoing  GOAL:   Patient will meet greater than or equal to 90% of their needs  Met with TF  MONITOR:   PO intake, Supplement acceptance, Labs, Weight trends, TF tolerance, I & O's  REASON FOR ASSESSMENT:   Consult Enteral/tube feeding initiation and management  ASSESSMENT:   78y.o. female with medical history significant for GERD, IBS, hypertension, type 2 diabetes mellitus, chronic diastolic CHF, and COPD, now presenting to the emergency department for evaluation of generalized weakness, loss of appetite, and diarrhea.  Patient was admitted to the hospital from 06/12/2018 until 06/18/2018 with abdominal pain, nausea, vomiting, and loose stools, diagnosed with acute pancreatitis, suspected to have some gallbladder sludge or small stones, there was concern for cirrhosis based on imaging, she was evaluated by GI and surgery during the hospitalization, was recommended for laparoscopic cholecystectomy with liver biopsy and went home in improved condition with outpatient follow-up.  7/6- cortrak tube placed, tip of tube confirmed in stomach  Reviewed I/O's: +80 ml x 24 hours and +3.1 L since admission  UOP: 700 ml x 24 hours  Spoke with pt, who was in good spirits and sitting in recliner chair at time of visit. Pt reports "I feel better today than I have in a long time". Pt also appears physically improved by appearance since this RD last visited pt earlier this week.   Pt reports she is tolerating TF and clear liquid diet, however, PO intake is minimal. Pt reports that she consumed 1.5  IN[place] pops just prior to RD visit. She reports she has desire to eat more, however, does not like options on clear liquid diet (of note, diet advanced to fulls by GI after RD visit). RD provided pt with apple juice per her request.   Labs reviewed: Na: 133, CBGS: 153-184  Diet Order:   Diet Order            Diet full liquid Room service appropriate? Yes; Fluid consistency: Thin  Diet effective now              EDUCATION NEEDS:   No education needs have been identified at this time  Skin:  Skin Assessment: Skin Integrity Issues: Skin Integrity Issues:: Other (Comment) Other: MASD to buttocks  Last BM:  07/11/18  Height:   Ht Readings from Last 1 Encounters:  07/04/18 '5\' 4"'  (1.626 m)    Weight:   Wt Readings from Last 1 Encounters:  07/13/18 80.2 kg    Ideal Body Weight:  54.5 kg  BMI:  Body mass index is 30.33 kg/m.  Estimated Nutritional Needs:   Kcal:  1700-1900  Protein:  85-100 grams  Fluid:  > 1.7 L    Braileigh Landenberger A. WJimmye Norman RD, LDN, CWickerham Manor-FisherRegistered Dietitian II Certified Diabetes Care and Education Specialist Pager: [phone] hours Pager: [phone]

## 2018-07-13 NOTE — Progress Notes (Signed)
SATURATION QUALIFICATIONS: (This note is used to comply with regulatory documentation for home oxygen)  Patient Saturations on Room Air at Rest = 90%  Patient Saturations on Room Air while Ambulating = 86%  Patient Saturations on 2 Liters of oxygen while Ambulating = 92%  Please briefly explain why patient needs home oxygen: Pt with desaturation on RA with activity and requires supplemental oxygen to maintain sats >90% Elwyn Reach, PT Acute Rehabilitation Services Pager: [phone] Office: [phone]

## 2018-07-13 NOTE — Progress Notes (Signed)
Physical Therapy Treatment Patient Details Name: Heather Oconnor MRN: [id number] DOB: 12/21/1940 Today's Date: 07/13/2018    History of Present Illness 78 y.o. female admitted with weakness, diarrhea, pancreatic pseudocysts. PMhx: DM, HTN, GERD, IBS, chronic diastolic CHF, and COPD    PT Comments    Pt supine on arrival covered in toothpaste and eager to get gown changed stating she made a mess last night. Pt able to progress transfers and gait this session but remains limited by fatigue with sacral excoriation and soreness limiting her ability to sit with MD notified for donut cushion. Pt deferred HEp in sitting due to sacral pain. Pt attempted RA at rest with sats 90-95% but drop to 86% on RA with gait with transition to 2L with sats 92%. HR 97-111 with activity    Follow Up Recommendations  Home health PT;Supervision for mobility/OOB     Equipment Recommendations  Rolling walker with 5" wheels;3in1 (PT)    Recommendations for Other Services       Precautions / Restrictions Precautions Precautions: Fall Precaution Comments: cortrak, sacral excoriation Restrictions Weight Bearing Restrictions: No    Mobility  Bed Mobility   Bed Mobility: Supine to Sit     Supine to sit: Supervision     General bed mobility comments: supervision for lines with pt able to transition to EOB with rail and HOB 35 degrees  Transfers Overall transfer level: Needs assistance   Transfers: Sit to/from Stand Sit to Stand: Min guard         General transfer comment: min guard for safety/balance, cueing for hand placement from bed, toilet and to chair  Ambulation/Gait Ambulation/Gait assistance: Min guard Gait Distance (Feet): 200 Feet Assistive device: Rolling walker (2 wheeled) Gait Pattern/deviations: Decreased stride length;Step-through pattern;Trunk flexed   Gait velocity interpretation: >2.62 ft/sec, indicative of community ambulatory General Gait Details: pt with slow steady  gait with cues for posture and proximity to RW with pt limited by fatigue   Stairs             Wheelchair Mobility    Modified Rankin (Stroke Patients Only)       Balance Overall balance assessment: Needs assistance Sitting-balance support: No upper extremity supported;Feet supported Sitting balance-Leahy Scale: Good     Standing balance support: Bilateral upper extremity supported;During functional activity Standing balance-Leahy Scale: Poor                              Cognition Arousal/Alertness: Awake/alert Behavior During Therapy: WFL for tasks assessed/performed Overall Cognitive Status: Within Functional Limits for tasks assessed                                        Exercises      General Comments        Pertinent Vitals/Pain Faces Pain Scale: Hurts even more Pain Location: sacrum particularly with voiding Pain Descriptors / Indicators: Discomfort;Burning Pain Intervention(s): Limited activity within patient's tolerance;Repositioned;Monitored during session    Home Living                      Prior Function            PT Goals (current goals can now be found in the care plan section) Progress towards PT goals: Progressing toward goals    Frequency    Min 3X/week  PT Plan Current plan remains appropriate    Co-evaluation              AM-PAC PT "6 Clicks" Mobility   Outcome Measure  Help needed turning from your back to your side while in a flat bed without using bedrails?: A Little Help needed moving from lying on your back to sitting on the side of a flat bed without using bedrails?: A Little Help needed moving to and from a bed to a chair (including a wheelchair)?: A Little Help needed standing up from a chair using your arms (e.g., wheelchair or bedside chair)?: A Little Help needed to walk in hospital room?: A Little Help needed climbing 3-5 steps with a railing? : A Little 6 Click  Score: 18    End of Session Equipment Utilized During Treatment: Gait belt;Oxygen Activity Tolerance: Patient tolerated treatment well Patient left: in chair;with call bell/phone within reach;with chair alarm set Nurse Communication: Mobility status PT Visit Diagnosis: Other abnormalities of gait and mobility (R26.89);Muscle weakness (generalized) (M62.81)     Time: 2694-8546 PT Time Calculation (min) (ACUTE ONLY): 30 min  Charges:  $Gait Training: 8-22 mins $Therapeutic Activity: 8-22 mins                     Roneshia Drew Pam Drown, PT Acute Rehabilitation Services Pager: [phone] Office: [place] 07/13/2018, 12:53 PM

## 2018-07-14 DIAGNOSIS — J449 Chronic obstructive pulmonary disease, unspecified: Secondary | ICD-10-CM

## 2018-07-14 DIAGNOSIS — I5032 Chronic diastolic (congestive) heart failure: Secondary | ICD-10-CM

## 2018-07-14 LAB — BASIC METABOLIC PANEL
Anion gap: 9 (ref 5–15)
BUN: 6 mg/dL — ABNORMAL LOW (ref 8–23)
CO2: 29 mmol/L (ref 22–32)
Calcium: 8.3 mg/dL — ABNORMAL LOW (ref 8.9–10.3)
Chloride: 96 mmol/L — ABNORMAL LOW (ref 98–111)
Creatinine, Ser: 0.42 mg/dL — ABNORMAL LOW (ref 0.44–1.00)
GFR calc Af Amer: >60 mL/min (ref >60)
GFR calc non Af Amer: >60 mL/min (ref >60)
Glucose, Bld: 172 mg/dL — ABNORMAL HIGH (ref 70–99)
Potassium: 4.4 mmol/L (ref 3.5–5.1)
Sodium: 134 mmol/L — ABNORMAL LOW (ref 135–145)

## 2018-07-14 LAB — GLUCOSE, CAPILLARY
Glucose-Capillary: 144 mg/dL — ABNORMAL HIGH (ref 70–99)
Glucose-Capillary: 166 mg/dL — ABNORMAL HIGH (ref 70–99)
Glucose-Capillary: 166 mg/dL — ABNORMAL HIGH (ref 70–99)
Glucose-Capillary: 178 mg/dL — ABNORMAL HIGH (ref 70–99)
Glucose-Capillary: 223 mg/dL — ABNORMAL HIGH (ref 70–99)

## 2018-07-14 LAB — T4, FREE: Free T4: 1.06 ng/dL (ref 0.61–1.12)

## 2018-07-14 MED ORDER — FUROSEMIDE 40 MG PO TABS: 40.0000 mg | ORAL_TABLET | Freq: Every day | ORAL | Status: DC

## 2018-07-14 MED ORDER — PANTOPRAZOLE SODIUM 40 MG PO TBEC: 40.0000 mg | DELAYED_RELEASE_TABLET | Freq: Every day | ORAL | Status: DC

## 2018-07-14 MED ORDER — ENSURE ENLIVE PO LIQD
474.0000 mL | ORAL | Status: AC
  Administered 2018-07-14: 474 mL via ORAL

## 2018-07-14 NOTE — Care Management Important Message (Signed)
Important Message  Patient Details  Name: Heather Oconnor MRN: [id number] Date of Birth: 1940-05-30   Medicare Important Message Given:  Yes     Shelda Altes 07/14/2018, 1:05 PM

## 2018-07-14 NOTE — Progress Notes (Signed)
SATURATION QUALIFICATIONS: (This note is used to comply with regulatory documentation for home oxygen)  Patient Saturations on Room Air at Rest = 90%  Patient Saturations on Room Air while Ambulating = 87%  Patient Saturations on 2 Liters of oxygen while Ambulating = 92%

## 2018-07-14 NOTE — Discharge Summary (Signed)
Physician Discharge Summary  Myrene Galaslizabeth W Neria ZOX:[id number] DOB: 09/19/1940 DOA: 07/04/2018  PCP: Patient, No Pcp Per Formerly Dr. Lysbeth GalasNyland.  She has a follow-up appointment [facility].  Admit date: 07/04/2018 Discharge date: 07/14/2018  Recommendations for Outpatient Follow-up:   Pancreatic necrosis (walled off), with evolving pseudocysts Follow-up with Dr. Jena Gaussourk in 3-4 weeks.  Consider repeat CT scan at that time to see if peripancreatic fluid collections are still present.  Cirrhosis of liver? --Follow-up with GI as an outpatient is planned  Acute hypoxic respiratory failure, started on oxygen on discharge, likely related to underlying COPD, possibly a component of chronic diastolic CHF --can likely wean off over the next 1-2 weeks  TSH was slightly low on admission 0.308 --Likely of no clinical significance.  T4 within normal limits.  Suggest repeat testing as an outpatient.  Follow-up Information    Rourk, Gerrit Friendsobert M, MD. Schedule an appointment as soon as possible for a visit in 3 week(s).   Specialty: Gastroenterology Contact information: [address] [place] [address] [phone]            Discharge Diagnoses: Principal diagnosis is #1 1. Pancreatic necrosis (walled off), with evolving pseudocysts 2. Nutrition 3. Diabetes mellitus type 2, hemoglobin A1c 6.7 Chronic diastolic CHF 4. Acute hypoxic respiratory failure 5. COPD 6. Aortic atherosclerosis  Discharge Condition: improved Disposition: home with HHRN, PT  Diet recommendation: full liquid + Ensure BID; can advance to low-fat diet as tolerated.  Filed Weights   07/12/18 0130 07/13/18 0300 07/14/18 0300  Weight: 78.6 kg 80.2 kg 77.5 kg    History of present illness: 78 year old woman status post bout of pancreatitis presented with generalized weakness, anorexia.  She failed to improve and so further imaging was pursued which revealed 3 large collections  consistent with pseudocyst and walled off pancreatic necrosis.  Hospital Course:  Patient was seen by gastroenterology in consultation and treated with supportive care, she continued to have anorexia and Cortrak tube was placed and she was started on tube feeds.  The tube was actually positioned in the stomach but gastroenterology felt a trial tube feeds was reasonable in this location.  Patient tolerated the tube feeds well and was transitioned from clear liquid to full liquid diet.  She is remained afebrile, no abdominal pain and remains asymptomatic.  In discussion with Dr. Christella HartiganJacobs 7/10 it was felt that if she tolerated Ensure that she can be discharged home with outpatient follow-up in [place] at her GI clinic.  She has a history of subclinical emphysema, former smoker, and was found to have mild hypoxia during this admission.  This was also found on previous admission but she has not been on home oxygen before.  Plan for discharge home on home oxygen.  May be able to wean as her clinical condition improves.  Pancreatic necrosis (walled off), with evolving pseudocysts (3 fluid collections); secondary to acute pancreatitis, admitted 6/8-6/14.  There was concern at that time for microlithiasis as well as cirrhosis based on imaging and consideration was given to outpatient laparoscopic cholecystectomy with liver biopsy. --Managed conservatively.  Clinically improving.  Tolerating tube feeds and at the stomach and full liquid diet.  Per Dr. Christella HartiganJacobs, if the patient can drink to cans of Ensure she can go home today.  He recommends full liquid diet for several days and then can advance to low-fat diet.  Follow-up with Dr. Jena Gaussourk in 3-4 weeks.  Consider repeat CT scan at that time to see if  peripancreatic fluid collections are still present.  Nutrition --Diet as above  Cirrhosis of liver? --Follow-up with GI as an outpatient is planned  Diabetes mellitus type 2, hemoglobin A1c 6.7 --remained stable.   Continue scheduled and sliding scale NovoLog with tube feeds.  Chronic diastolic CHF --Remained stable.  No significant lower extremity edema.  COPD with acute hypoxic respiratory failure, CT chest consistent with emphysema.  Had acute hypoxic respiratory failure last hospitalization in June.  Thought secondary to underlying emphysema and acute on chronic CHF at that time. --Remained stable.  Patient denies being on oxygen in the past.  She denies shortness of breath.  She is a former smoker.  She is hypoxic with ambulation, she will be discharged home on oxygen.  Is possible this may be able to be weaned if her clinical condition improves.  TSH was slightly low on admission 0.308 --Likely of no clinical significance.  T4 within normal limits.  Suggest repeat testing as an outpatient.  Aortic atherosclerosis  Consultants   Gastroenterology  Procedures   7/6 Cortrak tube placement  For exam see progress note same day.  Discharge Instructions   Allergies as of 07/14/2018      Reactions   Hctz [hydrochlorothiazide]    Per GI may have caused pancreatitis. Dr. Christella HartiganJacobs recommended never resuming.      Medication List    STOP taking these medications   lisinopril 20 MG tablet Commonly known as: ZESTRIL   loperamide 2 MG capsule Commonly known as: IMODIUM   metoprolol succinate 100 MG 24 hr tablet Commonly known as: TOPROL-XL   oxyCODONE 5 MG immediate release tablet Commonly known as: Oxy IR/ROXICODONE     TAKE these medications   acetaminophen 500 MG tablet Commonly known as: TYLENOL Take 2 tablets (1,000 mg total) by mouth every 6 (six) hours as needed.   albuterol 108 (90 Base) MCG/ACT inhaler Commonly known as: VENTOLIN HFA Inhale 2 puffs into the lungs every 6 (six) hours as needed for wheezing or shortness of breath.   amLODipine 10 MG tablet Commonly known as: NORVASC Take 1 tablet (10 mg total) by mouth daily.   aspirin EC 81 MG tablet Take 1 tablet  (81 mg total) by mouth daily with breakfast.   clopidogrel 75 MG tablet Commonly known as: PLAVIX Take 75 mg by mouth daily.   DORZOLAMIDE HCL-TIMOLOL MAL OP Place 1 drop into the right eye daily.   furosemide 40 MG tablet Commonly known as: Lasix Take 1 tablet (40 mg total) by mouth daily.   glipiZIDE 5 MG 24 hr tablet Commonly known as: GLUCOTROL XL Take 1 tablet (5 mg total) by mouth daily.   hydrALAZINE 50 MG tablet Commonly known as: APRESOLINE Take 1 tablet (50 mg total) by mouth 3 (three) times daily.   isosorbide mononitrate 30 MG 24 hr tablet Commonly known as: IMDUR Take 1 tablet (30 mg total) by mouth daily.   metFORMIN 500 MG tablet Commonly known as: GLUCOPHAGE Take 1 tablet (500 mg total) by mouth 2 (two) times daily with a meal.   ondansetron 4 MG tablet Commonly known as: ZOFRAN Take 1 tablet (4 mg total) by mouth every 6 (six) hours as needed for nausea.   pantoprazole 40 MG tablet Commonly known as: PROTONIX Take 1 tablet (40 mg total) by mouth daily.   umeclidinium bromide 62.5 MCG/INH Aepb Commonly known as: INCRUSE ELLIPTA Inhale 1 puff into the lungs daily. Dx- J44.1   Xalatan 0.005 % ophthalmic solution Generic drug:  latanoprost Place 1 drop into the right eye at bedtime.            Durable Medical Equipment  (From admission, onward)         Start     Ordered   07/14/18 1252  For home use only DME oxygen  Once    Question Answer Comment  Length of Need 6 Months   Mode or (Route) Nasal cannula   Liters per Minute 2   Frequency Continuous (stationary and portable oxygen unit needed)   Oxygen delivery system Gas      07/14/18 1251         Allergies  Allergen Reactions   Hctz [Hydrochlorothiazide]     Per GI may have caused pancreatitis. Dr. Ardis Hughs recommended never resuming.    The results of significant diagnostics from this hospitalization (including imaging, microbiology, ancillary and laboratory) are listed below for  reference.    Significant Diagnostic Studies: Dg Chest 2 View  Result Date: 07/08/2018 CLINICAL DATA:  Patient with fever for 1 day. EXAM: CHEST - 2 VIEW COMPARISON:  Chest radiograph 07/04/2018 FINDINGS: Stable cardiomegaly. Small bilateral pleural effusions, left greater than right with underlying pulmonary consolidation. Bilateral interstitial pulmonary opacities. Thoracic spine degenerative changes. IMPRESSION: Cardiomegaly. Findings suggestive of interstitial pulmonary edema, small effusions and underlying atelectasis. Electronically Signed   By: Lovey Newcomer M.D.   On: 07/08/2018 15:51   Dg Chest 2 View  Result Date: 07/04/2018 CLINICAL DATA:  Weakness and decreased appetite. EXAM: CHEST - 2 VIEW COMPARISON:  Chest x-ray 06/14/2018 and chest CT 06/16/2018 FINDINGS: The heart is borderline enlarged but stable. There is mild tortuosity and calcification of the thoracic aorta. There are persistent small residual bilateral pleural effusions and bibasilar atelectasis. No edema or infiltrates and no worrisome pulmonary lesions. The bony thorax is intact. IMPRESSION: Small residual bilateral pleural effusions and bibasilar atelectasis but no definite infiltrates or edema. Electronically Signed   By: Marijo Sanes M.D.   On: 07/04/2018 16:56   Ct Abdomen Pelvis W Contrast  Result Date: 07/06/2018 CLINICAL DATA:  Pancreatitis.  Acute generalized abdominal pain. EXAM: CT ABDOMEN AND PELVIS WITH CONTRAST TECHNIQUE: Multidetector CT imaging of the abdomen and pelvis was performed using the standard protocol following bolus administration of intravenous contrast. CONTRAST:  129mL OMNIPAQUE IOHEXOL 300 MG/ML  SOLN COMPARISON:  CT scan of June 12, 2018. FINDINGS: Lower chest: Minimal bilateral pleural effusions are noted with adjacent subsegmental atelectasis. Hepatobiliary: Hepatic steatosis is noted. No cholelithiasis or biliary dilatation is noted. Pancreas: There is interval development of multiple fluid  collections around the pancreas, some with peripheral wall enhancement. These are consistent with developing pseudocysts. One noted inferiorly measures 6.7 x 4.6 cm. Another measures 7.8 x 6.1 cm. 9.1 x 5.3 cm fluid collection is seen superior to the pancreas, also consistent with developing pseudocyst. Spleen: Normal in size without focal abnormality. Adrenals/Urinary Tract: Adrenal glands are unremarkable. Kidneys are normal, without renal calculi, focal lesion, or hydronephrosis. Bladder is unremarkable. Stomach/Bowel: Stomach is within normal limits. Appendix appears normal. No evidence of bowel wall thickening, distention, or inflammatory changes. Vascular/Lymphatic: Aortic atherosclerosis. No enlarged abdominal or pelvic lymph nodes. Reproductive: Uterus and bilateral adnexa are unremarkable. Other: No hernia is noted. Musculoskeletal: No acute or significant osseous findings. IMPRESSION: Interval development of multiple large peripancreatic fluid collections with some peripheral wall enhancement, consistent with developing pseudocysts. The largest measures 9.1 x 5.3 cm superior to the pancreas. Probable hepatic steatosis. Minimal bilateral pleural effusions are noted  with adjacent subsegmental atelectasis. Aortic Atherosclerosis (ICD10-I70.0). Electronically Signed   By: Lupita RaiderJames  Green Jr M.D.   On: 07/06/2018 18:41   Dg Abd Portable 1v  Result Date: 07/10/2018 CLINICAL DATA:  78 year old female with history of pancreatitis, status post feeding tube placement. EXAM: PORTABLE ABDOMEN - 1 VIEW COMPARISON:  CT of the abdomen pelvis dated 07/06/2018 FINDINGS: A feeding tube is partially visualized with tip to the right of the spine likely in the distal stomach. No dilatation of the small bowel. Air is noted in the colon. No free air identified on the provided image. Left lung base consolidative changes partially visualized. There is atherosclerotic calcification of the abdominal aorta and iliac arteries.  Degenerative changes of the spine. No acute osseous pathology. IMPRESSION: Feeding tube with tip likely in the distal stomach. Electronically Signed   By: Elgie CollardArash  Radparvar M.D.   On: 07/10/2018 10:43   Ct Angio Chest Aorta W/cm &/or Wo/cm  Result Date: 06/16/2018 CLINICAL DATA:  Patient admitted 06/12/2018 with pancreatitis and dehydration. Increasing shortness of breath. EXAM: CT ANGIOGRAPHY CHEST WITH CONTRAST TECHNIQUE: Multidetector CT imaging of the chest was performed using the standard protocol during bolus administration of intravenous contrast. Multiplanar CT image reconstructions and MIPs were obtained to evaluate the vascular anatomy. CONTRAST:  100 mL OMNIPAQUE IOHEXOL 350 MG/ML SOLN COMPARISON:  PA and lateral chest 06/12/2018. Single-view of the chest 06/14/2018 and 06/13/2018. FINDINGS: Cardiovascular: No pulmonary embolus is identified. There is cardiomegaly. No pericardial effusion. No aneurysm. Calcific aortic and coronary atherosclerosis noted. Mediastinum/Nodes: The right lobe of the thyroid is mildly heterogeneous. Esophagus appears normal. No lymphadenopathy. Lungs/Pleura: Small bilateral pleural effusions are present, greater on the left. Associated mild compressive atelectasis is present. There is some emphysematous disease. No nodule, mass or consolidative process. No evidence of pulmonary edema. Upper Abdomen: Visualized upper abdomen demonstrates fatty infiltration of the liver. There is partial visualization of inflammatory change in fluid about the pancreas. Small cystic lesion in the spleen is noted. Musculoskeletal: No fracture or worrisome lesion. Multilevel thoracic spondylosis is noted. Review of the MIP images confirms the above findings. IMPRESSION: Negative for pulmonary embolus. Small bilateral pleural effusions, greater on the left with associated mild compressive atelectasis. Cardiomegaly. Fatty infiltration of the liver. Partial visualization of changes in the upper  abdomen consistent with the patient's known pancreatitis. Aortic Atherosclerosis (ICD10-I70.0) and Emphysema (ICD10-J43.9). Calcific coronary artery disease also noted. Electronically Signed   By: Drusilla Kannerhomas  Dalessio M.D.   On: 06/16/2018 13:14    Microbiology: Recent Results (from the past 240 hour(s))  Culture, blood (routine x 2)     Status: None   Collection Time: 07/04/18  5:30 PM   Specimen: BLOOD LEFT WRIST  Result Value Ref Range Status   Specimen Description BLOOD LEFT WRIST  Final   Special Requests   Final    BOTTLES DRAWN AEROBIC ONLY Blood Culture results may not be optimal due to an inadequate volume of blood received in culture bottles   Culture   Final    NO GROWTH 5 DAYS Performed at [facility], [address] [place], [place] [address]    Report Status 07/09/2018 FINAL  Final  Culture, blood (routine x 2)     Status: None   Collection Time: 07/04/18  5:40 PM   Specimen: BLOOD RIGHT HAND  Result Value Ref Range Status   Specimen Description BLOOD RIGHT HAND  Final   Special Requests   Final    BOTTLES DRAWN AEROBIC AND  ANAEROBIC Blood Culture adequate volume   Culture   Final    NO GROWTH 5 DAYS Performed at [facility], [address], [place], [place] [address]    Report Status 07/09/2018 FINAL  Final  SARS Coronavirus 2 (CEPHEID - Performed in [facility] hospital lab), Hosp Order     Status: None   Collection Time: 07/04/18  6:47 PM   Specimen: Nasopharyngeal Swab  Result Value Ref Range Status   SARS Coronavirus 2 NEGATIVE NEGATIVE Final    Comment: (NOTE) If result is NEGATIVE SARS-CoV-2 target nucleic acids are NOT DETECTED. The SARS-CoV-2 RNA is generally detectable in upper and lower  respiratory specimens during the acute phase of infection. The lowest  concentration of SARS-CoV-2 viral copies this assay can detect is 250  copies / mL. A negative result does not preclude SARS-CoV-2 infection  and should not be used as the sole basis for  treatment or other  patient management decisions.  A negative result may occur with  improper specimen collection / handling, submission of specimen other  than nasopharyngeal swab, presence of viral mutation(s) within the  areas targeted by this assay, and inadequate number of viral copies  (<250 copies / mL). A negative result must be combined with clinical  observations, patient history, and epidemiological information. If result is POSITIVE SARS-CoV-2 target nucleic acids are DETECTED. The SARS-CoV-2 RNA is generally detectable in upper and lower  respiratory specimens dur ing the acute phase of infection.  Positive  results are indicative of active infection with SARS-CoV-2.  Clinical  correlation with patient history and other diagnostic information is  necessary to determine patient infection status.  Positive results do  not rule out bacterial infection or co-infection with other viruses. If result is PRESUMPTIVE POSTIVE SARS-CoV-2 nucleic acids MAY BE PRESENT.   A presumptive positive result was obtained on the submitted specimen  and confirmed on repeat testing.  While 2019 novel coronavirus  (SARS-CoV-2) nucleic acids may be present in the submitted sample  additional confirmatory testing may be necessary for epidemiological  and / or clinical management purposes  to differentiate between  SARS-CoV-2 and other Sarbecovirus currently known to infect humans.  If clinically indicated additional testing with an alternate test  methodology [phone]) is advised. The SARS-CoV-2 RNA is generally  detectable in upper and lower respiratory sp ecimens during the acute  phase of infection. The expected result is Negative. Fact Sheet for Patients:  BoilerBrush.com.cy Fact Sheet for Healthcare Providers: https://pope.com/ This test is not yet approved or cleared by the [place] FDA and has been authorized for detection and/or  diagnosis of SARS-CoV-2 by FDA under an Emergency Use Authorization (EUA).  This EUA will remain in effect (meaning this test can be used) for the duration of the COVID-19 declaration under Section 564(b)(1) of the Act, 21 U.S.C. section 360bbb-3(b)(1), unless the authorization is terminated or revoked sooner. Performed at [facility], [address], [place], [place] [address]      Labs: Basic Metabolic Panel: Recent Labs  Lab 07/10/18 0550 07/11/18 0533 07/12/18 0504 07/13/18 0508 07/14/18 0530  NA 135 131* 131* 133* 134*  K 4.0 4.1 4.3 4.4 4.4  CL 94* 94* 95* 96* 96*  CO2 GLUCOSE 141* 227* 189* 179* 172*  BUN <5* <5* 5* 6* 6*  CREATININE 0.52 0.53 0.66 0.40* 0.42*  CALCIUM 8.1* 8.0* 7.7* 7.8* 8.3*  MG  --  1.7  --   --   --  PHOS  --  2.7  --   --   --    Liver Function Tests: Recent Labs  Lab 07/08/18 0337  AST 32  ALT 35  ALKPHOS 45  BILITOT 0.7  PROT 5.2*  ALBUMIN 1.7*   CBC: Recent Labs  Lab 07/08/18 0337 07/10/18 0550 07/11/18 0533 07/12/18 0504  WBC 7.8 9.1 11.0* 10.0  HGB 9.9* 10.3* 10.1* 9.5*  HCT 31.1* 32.7* 31.8* 30.5*  MCV 88.6 89.1 88.8 88.7  PLT 196 196 201 180   CBG: Recent Labs  Lab 07/13/18 2023 07/14/18 0016 07/14/18 0419 07/14/18 0811 07/14/18 1135  GLUCAP 137* 223* 166* 166* 144*    Principal Problem:   Pseudocyst of pancreas Active Problems:   Essential hypertension   Type 2 diabetes mellitus without complication (HCC)   Elevated LFTs/Needs Liver Biopsy   Abdominal pain, vomiting, and diarrhea   Chronic diastolic CHF (congestive heart failure) (HCC)   Hyponatremia   COPD (chronic obstructive pulmonary disease) (HCC)   Hypoxia   Time coordinating discharge: 40 minutes  Signed:  Brendia Sacks, MD  [facility]  07/14/2018, 2:35 PM

## 2018-07-14 NOTE — Progress Notes (Addendum)
PROGRESS NOTE  Heather Oconnor HER:[id number] DOB: 13-Oct-1940 DOA: 07/04/2018 PCP: Patient, No Pcp Per  Brief History   78 year old woman status post bout of pancreatitis presented with generalized weakness, anorexia.  She failed to improve and so further imaging was pursued which revealed 3 large collections consistent with pseudocyst and walled off pancreatic necrosis.  A & P  Pancreatic necrosis (walled off), with evolving pseudocysts (3 fluid collections); secondary to acute pancreatitis, admitted 6/8-6/14.  There was concern at that time for microlithiasis as well as cirrhosis based on imaging and consideration was given to outpatient laparoscopic cholecystectomy with liver biopsy. --Asymptomatic.  Clinically appears to be improving.  Continue diet and management as per GI.  Nutrition --Continue TF through Cortrak as per Dr. Ardis Hughs, feeding tube in stomach but per GI this is okay.  Tolerating well. Cirrhosis of liver? --Follow-up with GI as an outpatient is planned  Diabetes mellitus type 2, hemoglobin A1c 6.7 --remained stable.  Continue scheduled and sliding scale NovoLog with tube feeds.  Chronic diastolic CHF --Remained stable.  No significant lower extremity edema.  COPD, CT chest consistent with emphysema.  Had acute hypoxic respiratory failure last hospitalization in June.  Thought secondary to underlying emphysema and acute on chronic CHF at that time. --Stable.  TSH was slightly low on admission 0.308 --Likely of no clinical significance.  T4 within normal limits.  Suggest repeat testing as an outpatient.  Aortic atherosclerosis   Overall improving.  Will discuss with Dr. Ardis Hughs today in regard to discharge planning.  DVT prophylaxis: enoxaparin Code Status: Full Family Communication: none Disposition Plan: Home with home health PT   Murray Hodgkins, MD  [facility] Direct contact: see [email or web address] (further directions at bottom of note if needed) 7PM-7AM  contact night coverage as at bottom of note 07/14/2018, 10:33 AM  LOS: 9 days   Consultants  . Gastroenterology  Procedures  . 7/6 Cortrak tube placement  Antibiotics  .   Interval History/Subjective  Continues to feel better.  No abdominal pain, nausea or vomiting.  Objective   Vitals:  Vitals:   07/14/18 0835 07/14/18 0844  BP:  (!) 151/65  Pulse: 84 86  Resp: 18   Temp:    SpO2: 98%     Exam:  Constitutional:   . Appears calm and comfortable Respiratory:  . CTA bilaterally, no w/r/r.  . Respiratory effort normal.  Cardiovascular:  . RRR, no m/r/g . No LE extremity edema   Abdomen:  . Soft, nontender, nondistended Psychiatric:  . Mental status o Mood, affect appropriate . judgment and insight appear intact    I have personally reviewed the following:   Today's Data  . Basic metabolic panel unremarkable, sodium 134 . Free T4 within normal limits  Lab Data  .   Micro Data  .   Imaging  . Chest x-ray 7/4 showed cardiomegaly possible interstitial edema . CT abdomen and pelvis noted  Cardiology Data  . EKG sinus rhythm  Other Data  .      Scheduled Meds: . amLODipine  10 mg Per Tube Daily  . dorzolamide-timolol  1 drop Right Eye Daily  . enoxaparin (LOVENOX) injection  40 mg Subcutaneous Q24H  . furosemide  20 mg Intravenous Daily  . hydrocortisone cream   Topical QID  . insulin aspart  0-15 Units Subcutaneous Q4H  . insulin aspart  3 Units Subcutaneous Q4H  . latanoprost  1 drop Right Eye QHS  . mouth rinse  15 mL Mouth Rinse  BID  . metoprolol tartrate  25 mg Per Tube BID  . pantoprazole sodium  40 mg Per Tube Daily  . sodium chloride flush  3 mL Intravenous Q12H  . umeclidinium bromide  1 puff Inhalation Daily  . vitamin B-12  1,000 mcg Per Tube Daily   Continuous Infusions: . feeding supplement (OSMOLITE 1.2 CAL) 65 mL/hr at 07/14/18 0600    Principal Problem:   Pseudocyst of pancreas Active Problems:   Essential hypertension    Type 2 diabetes mellitus without complication (HCC)   Elevated LFTs/Needs Liver Biopsy   Abdominal pain, vomiting, and diarrhea   Chronic diastolic CHF (congestive heart failure) (HCC)   Hyponatremia   COPD (chronic obstructive pulmonary disease) (HCC)   Hypoxia   LOS: 9 days   How to contact the [facility] Attending or Consulting provider 7A - 7P or covering provider during after hours 7P -7A, for this patient?  1. Check the care team in [facility] and look for a) attending/consulting TRH provider listed and b) the [facility] team listed 2. Log into [email or web address] and use [place]'s universal password to access. If you do not have the password, please contact the hospital operator. 3. Locate the [facility] provider you are looking for under [facility] and page to a number that you can be directly reached. 4. If you still have difficulty reaching the provider, please page the [facility] (Director on Call) for the Hospitalists listed on amion for assistance.   Time >35 minutes, counseling and coordination of care, d/w patient and Dr. Christella HartiganJacobs.

## 2018-07-14 NOTE — Progress Notes (Signed)
Physical Therapy Treatment Patient Details Name: Heather Oconnor MRN: [id number] DOB: 11/08/1940 Today's Date: 07/14/2018    History of Present Illness 78 y.o. female admitted with weakness, diarrhea, pancreatic pseudocysts. PMhx: DM, HTN, GERD, IBS, chronic diastolic CHF, and COPD    PT Comments    Pt pleasant in bed on arrival having eaten some of her tray. Pt is eager to return home and to have hemorrhoid addressed. Pt with increased activity tolerance with need for return to bed due to inability to tolerating sitting on sacrum. Pt encouraged to continue mobility with nursing and continues to require assist for pericare.  Pt able to maintain 90% on 1L with activity this session HR 89-111   Follow Up Recommendations  Home health PT;Supervision for mobility/OOB     Equipment Recommendations  Rolling walker with 5" wheels;3in1 (PT)    Recommendations for Other Services       Precautions / Restrictions Precautions Precautions: Fall Precaution Comments: cortrak, sacral excoriation Restrictions Weight Bearing Restrictions: No    Mobility  Bed Mobility Overal bed mobility: Needs Assistance       Supine to sit: Supervision     General bed mobility comments: supervision for lines with pt able to transition to EOB with rail and HOB 25 degrees  Transfers Overall transfer level: Needs assistance   Transfers: Sit to/from Stand Sit to Stand: Supervision         General transfer comment: supervision for lines with cues for hand placement, pt able to rise from bed and toilet without assist. assist for pericare in standing  Ambulation/Gait Ambulation/Gait assistance: Min guard Gait Distance (Feet): 260 Feet Assistive device: Rolling walker (2 wheeled) Gait Pattern/deviations: Decreased stride length;Step-through pattern;Trunk flexed   Gait velocity interpretation: >2.62 ft/sec, indicative of community ambulatory General Gait Details: pt with slow steady gait with cues  for posture and proximity to RW with pt limited by fatigue   Stairs             Wheelchair Mobility    Modified Rankin (Stroke Patients Only)       Balance Overall balance assessment: Needs assistance Sitting-balance support: No upper extremity supported;Feet supported Sitting balance-Leahy Scale: Good Sitting balance - Comments: able to sit EOB and toilet without assist   Standing balance support: Bilateral upper extremity supported;During functional activity Standing balance-Leahy Scale: Poor                              Cognition Arousal/Alertness: Awake/alert Behavior During Therapy: WFL for tasks assessed/performed Overall Cognitive Status: Within Functional Limits for tasks assessed                                        Exercises General Exercises - Lower Extremity Long Arc Quad: AROM;15 reps;Both;Seated Hip Flexion/Marching: AROM;15 reps;Both;Seated    General Comments        Pertinent Vitals/Pain Faces Pain Scale: Hurts even more Pain Location: sacrum particularly with voiding and pericare Pain Descriptors / Indicators: Discomfort;Burning Pain Intervention(s): Limited activity within patient's tolerance;Repositioned;Monitored during session    Home Living                      Prior Function            PT Goals (current goals can now be found in the care plan section) Progress towards  PT goals: Progressing toward goals    Frequency           PT Plan Current plan remains appropriate    Co-evaluation              AM-PAC PT "6 Clicks" Mobility   Outcome Measure  Help needed turning from your back to your side while in a flat bed without using bedrails?: A Little Help needed moving from lying on your back to sitting on the side of a flat bed without using bedrails?: A Little Help needed moving to and from a bed to a chair (including a wheelchair)?: A Little Help needed standing up from a chair  using your arms (e.g., wheelchair or bedside chair)?: A Little Help needed to walk in hospital room?: A Little Help needed climbing 3-5 steps with a railing? : A Little 6 Click Score: 18    End of Session Equipment Utilized During Treatment: Gait belt;Oxygen Activity Tolerance: Patient tolerated treatment well Patient left: in bed;with call bell/phone within reach;with bed alarm set(with donut cushion. Pt denied sitting in chair due to sacral pain) Nurse Communication: Mobility status PT Visit Diagnosis: Other abnormalities of gait and mobility (R26.89);Muscle weakness (generalized) (M62.81)     Time: 4628-6381 PT Time Calculation (min) (ACUTE ONLY): 27 min  Charges:  $Gait Training: 8-22 mins $Therapeutic Activity: 8-22 mins                     [place], PT Acute Rehabilitation Services Pager: [phone] Office: [place] 07/14/2018, 12:04 PM

## 2018-07-14 NOTE — Progress Notes (Signed)
Pt just finished drinking her second ensure. Pt denies nausea, vomiting or abdominal pain.  Pt states she already called her husband to come pick her up.  Nurse spoke with pt and let her know that we are waiting for her oxygen to be delivered.

## 2018-07-14 NOTE — Progress Notes (Signed)
Patient has drink her first ensure completely. Pt denies nausea,vomiting or abdominal pains.   Pt ambulated in the hallway using her walker. Pt's oxygen saturation down to 87% while ambulating, purse lip breathing technique teaching given.  Put pt in 2 L nasal canula and oxygen saturation came up to 92%.   currently pt is lying in bed, on 2 L nasal canula at oxygen saturation of 96%.  Second ensure given, will monitor pt for any nausea, vomiting or abdominal pains.  Pt states that she wants to go home.

## 2018-07-14 NOTE — TOC Transition Note (Addendum)
Transition of Care [facility]) - CM/SW Discharge Note   Patient Details  Name: Heather Oconnor MRN: [id number] Date of Birth: 08-06-1940  Transition of Care [facility]) CM/SW Contact:  Sharin Mons, RN Phone Number: 07/14/2018, 3:43 PM   Clinical Narrative:  Pt readmitted with with weakness, elevated LFT's,  hx of GERD, IBS, hypertension, type 2 diabetes mellitus, chronic diastolic CHF, and COPD. Previous admit 6/8- 614/2020, acute pancreatitis.  Per MD plan is to transition to home today with the resumption of home health services with [facility]. DME oxygen , portable concentrator will be delivered to bedside prior to d/c per [employer].  Pt states already has rolling walker, and declines the need for BSC.  Heather Oconnor (Other)     [phone]      Heather Oconnor  Final next level of care: Home w Home Health Services Barriers to Discharge: Continued Medical Work up   Patient Goals and CMS Choice Patient states their goals for this hospitalization and ongoing recovery are:: to go home CMS Medicare.gov Compare Post Acute Care list provided to:: Patient Choice offered to / list presented to : Patient  Discharge Placement                       Discharge Plan and Services   Discharge Planning Services: CM Consult            DME Arranged: Oxygen DME Agency: [employer] Date DME Agency Contacted: 07/14/18 Time DME Agency Contacted: [phone] Representative spoke with at DME Agency: [place]: PT, RN [place] Agency: [place] ([place]) Date [place]: 07/14/18 Time [place]: [place] Representative spoke with at [place]: Linna Hoff  Social Determinants of Health ([place]) Interventions     Readmission Risk Interventions No flowsheet data found.

## 2018-07-14 NOTE — Progress Notes (Signed)
[facility] Progress Note    Since last GI note: Feels well. Wants to go home. Tolerating (gastric) coretrak feeding as well as full liquids. NO abd pains, no vomiting. SHe feels much stronger overall and is ambulating in halls.  Objective: Vital signs in last 24 hours: Temp:  [98.3 F (36.8 C)-98.8 F (37.1 C)] 98.3 F (36.8 C) (07/10 1154) Pulse Rate:  [79-86] 79 (07/10 1154) Resp:  [18-19] 19 (07/10 1154) BP: (150-153)/(61-67) 153/67 (07/10 1154) SpO2:  [98 %-100 %] 100 % (07/10 1154) Weight:  [77.5 kg] 77.5 kg (07/10 0300) Last BM Date: 09/14/18 General: alert and oriented times 3 Heart: regular rate and rythm Abdomen: soft, non-tender, non-distended, normal bowel sounds   Lab Results: Recent Labs    07/12/18 0504  WBC 10.0  HGB 9.5*  PLT 180  MCV 88.7   Recent Labs    07/12/18 0504 07/13/18 0508 07/14/18 0530  NA 131* 133* 134*  K 4.3 4.4 4.4  CL 95* 96* 96*  CO2 28 28 29   GLUCOSE 189* 179* 172*  BUN 5* 6* 6*  CREATININE 0.66 0.40* 0.42*  CALCIUM 7.7* 7.8* 8.3*   No results for input(s): PROT, ALBUMIN, AST, ALT, ALKPHOS, BILITOT, BILIDIR, IBILI in the last 72 hours. No results for input(s): INR in the last 72 hours.   Studies/Results: No results found.   Medications: Scheduled Meds: . amLODipine  10 mg Per Tube Daily  . dorzolamide-timolol  1 drop Right Eye Daily  . enoxaparin (LOVENOX) injection  40 mg Subcutaneous Q24H  . [START ON 07/15/2018] furosemide  40 mg Per Tube Daily  . hydrocortisone cream   Topical QID  . insulin aspart  0-15 Units Subcutaneous Q4H  . insulin aspart  3 Units Subcutaneous Q4H  . latanoprost  1 drop Right Eye QHS  . mouth rinse  15 mL Mouth Rinse BID  . metoprolol tartrate  25 mg Per Tube BID  . [START ON 07/15/2018] pantoprazole  40 mg Oral Q0600  . sodium chloride flush  3 mL Intravenous Q12H  . umeclidinium bromide  1 puff Inhalation Daily  . vitamin B-12  1,000 mcg Per Tube Daily   Continuous  Infusions: . feeding supplement (OSMOLITE 1.2 CAL) 65 mL/hr at 07/14/18 0600   PRN Meds:.acetaminophen **OR** acetaminophen, albuterol, witch hazel-glycerin    Assessment/Plan: 78 y.o. female severe pancreatitis, unclear etiology  She looks and feels much better over the past 24-48 hours. SHe is tolerated TFs that are delivered into her stomach (not post pyloric based on imaging). In addition she is tolerated full liquids.  All without nausea, vomiting or abd pains.  SHe is very eager to go home and I think it is probably safe as long as she can maintain her nutrition with oral intake.   Will clamp TFs for now, bring up two cans of ensure or boost and if she is able to tolerate the nutritional liquids she should be fine for d/c home.  At home she can continue nutritional drinks for several days (2-3 per day) and is free to advance her diet as tolerated to low fat.  She will need follow up appt with Dr. Gala Romney in [place] in 3-4 weeks. At that time should consider repeat CT scan and if still prominent peripancreatic fluid collections then endoscopic therapy could be entertained (Dr. Rush Landmark) if they are causing her symptoms.  Please call or page with any further questions or concerns.    Milus Banister, MD  07/14/2018, 12:32 PM  [facility] Pager [phone]

## 2018-07-14 NOTE — Progress Notes (Signed)
Patient was discharged home. Cortrak removed.  Oxygen tank and concentrator delivered to room.  Peripheral iv removed, clean dry and intact, pressure and dressing and pressure applied.  Pt denies nausea, vomiting or abdominal pain.  Patient was cleaned and dressed.  Patient belongings with pt: tablet, charger, donut cushion, shoes and clothes.  Called john Schweppe pt's spouse per pt request to go over discharge information. Questions and concerns answered, medication education given with teach back.  Pt was transferred in wheelchair by nurse tech and nurse to [place] entrance.

## 2018-07-17 NOTE — Telephone Encounter (Signed)
Pt called this morning s/p being in the hospital for pancreatitis. Pt was asked to f/u with RMR. RMR is out of the office. Pt was offered an appointment today @ 11:00 AM and isn't able to come in the office today. Scheduled apt with EG for this Wednesday 07/19/18 @ 11:00 AM.

## 2018-07-19 ENCOUNTER — Telehealth: Payer: Self-pay | Admitting: *Deleted

## 2018-07-19 ENCOUNTER — Other Ambulatory Visit: Payer: Self-pay

## 2018-07-19 ENCOUNTER — Telehealth: Payer: Self-pay

## 2018-07-19 ENCOUNTER — Encounter: Payer: Self-pay | Admitting: Nurse Practitioner

## 2018-07-19 ENCOUNTER — Ambulatory Visit (INDEPENDENT_AMBULATORY_CARE_PROVIDER_SITE_OTHER): Payer: Medicare HMO | Admitting: Nurse Practitioner

## 2018-07-19 VITALS — BP 135/65 | HR 120 | Temp 98.7°F | Ht 64.0 in | Wt 157.4 lb

## 2018-07-19 DIAGNOSIS — K862 Cyst of pancreas: Secondary | ICD-10-CM

## 2018-07-19 DIAGNOSIS — K859 Acute pancreatitis without necrosis or infection, unspecified: Secondary | ICD-10-CM | POA: Diagnosis not present

## 2018-07-19 DIAGNOSIS — Z8673 Personal history of transient ischemic attack (TIA), and cerebral infarction without residual deficits: Secondary | ICD-10-CM

## 2018-07-19 DIAGNOSIS — R197 Diarrhea, unspecified: Secondary | ICD-10-CM

## 2018-07-19 MED ORDER — CLOPIDOGREL BISULFATE 75 MG PO TABS: 75.0000 mg | ORAL_TABLET | Freq: Every day | ORAL | 1 refills | Status: DC

## 2018-07-19 NOTE — Patient Instructions (Signed)
Your health issues we discussed today were:   Recent acute pancreatitis with pancreas cysts: 1. I will put in an order for a CT of your pancreas.  Radiology will call you to schedule 2. Call us if you have any worsening or severe symptoms  Diarrhea: 1. After your CT to evaluate your pancreas, if you still having diarrhea we can trial pancreas enzymes to see if this helps 2. Call us if you have any worsening or more frequent diarrhea  Overall I recommend:  1. You need to find a new primary care provider as soon as possible 2. I can send a refill of your Plavix for about 30 days to last you until you can find a new PCP 3. Continue your other current medications 4. Return for follow-up in 4 to 6 weeks 5. Call us if you have any questions or concerns.   Because of recent events of COVID-19 ("Coronavirus"), follow CDC recommendations:  1. Wash your hand frequently 2. Avoid touching your face 3. Stay away from people who are sick 4. If you have symptoms such as fever, cough, shortness of breath then call your healthcare provider for further guidance 5. If you are sick, STAY AT HOME unless otherwise directed by your healthcare provider. 6. Follow directions from state and national officials regarding staying safe   At [facility] we value your feedback. You may receive a survey about your visit today. Please share your experience as we strive to create trusting relationships with our patients to provide genuine, compassionate, quality care.  We appreciate your understanding and patience as we review any laboratory studies, imaging, and other diagnostic tests that are ordered as we care for you. Our office policy is 5 business days for review of these results, and any emergent or urgent results are addressed in a timely manner for your best interest. If you do not hear from our office in 1 week, please contact us.   We also encourage the use of MyChart, which contains your  medical information for your review as well. If you are not enrolled in this feature, an access code is on this after visit summary for your convenience. Thank you for allowing [place] to be involved in your care.  It was great to see you today!  I hope you have a great summer!!

## 2018-07-19 NOTE — Assessment & Plan Note (Signed)
Recent acute pancreatitis requiring hospitalization x2.  Her initial hospitalization was essentially unremarkable with uncomplicated pancreatitis, denies alcohol, no elevated triglycerides and deemed likely gallstone pancreatitis.  She did not want to have her gallbladder removed at that time.  However she was readmitted to the hospital with weakness and anorexia with further imaging revealing 3 large collections consistent with pseudocyst and walled off pancreatic necrosis.  Also question of possible cirrhosis.  She was put on tube feeds and eventually an oral full liquid diet.  She has been following a low-fat diet since discharge.  Recommended repeat CT exam at that time with outpatient follow-up to check the status of peripancreatic fluid collection and any further necrosis.  She is currently weak but slowly getting better.  She has frequent nausea, but rare to no vomiting.  She is having diarrhea although not frequently, about once a day which is yellow.  Pending results of her CT scan she may be a candidate for enzyme replacement therapy.  Recommend she continue her low-fat diet, CT pancreatic protocol, follow-up in 4 to 6 weeks.  Call for any worsening symptoms including fever, chills, persistent vomiting with inability to keep down food or fluids, worsening diarrhea.  Alternatively if we are not open she can present to the emergency department.

## 2018-07-19 NOTE — Telephone Encounter (Signed)
T/C from [place] with [phone]), saying pt was seen in hospital recently and hospital dr ordered Home health and physical therapy. Her PCP, Dr. Edrick Oh , recently retired and she is looking for new PCP.  Magda Paganini asked if our provider will be willing to sign home health papers for a few weeks until she establishes with new PCP.  I am forwarding this to Walden Field, NP who will see pt in the office today.   Randall Hiss, please advise!

## 2018-07-19 NOTE — Assessment & Plan Note (Signed)
Diarrhea on recent antibiotics and recent healthcare exposure.  However, she is not having profuse watery diarrhea, typically only one stool a day.  I doubt serious occult infection.  Most likely sequela of her acute pancreatitis which became complicated pancreatitis with development of large pseudocysts and walled off necrosis.  Follow-up imaging as per above.  She will likely be a candidate for enzyme replacement therapy.  We will recommend this depending on the results of her CT.  Follow-up in 4 to 6 weeks.

## 2018-07-19 NOTE — Telephone Encounter (Signed)
Will await paperwork to fill out until patient can get a new PCP.

## 2018-07-19 NOTE — Progress Notes (Signed)
NO PCP PER PATIENT °

## 2018-07-19 NOTE — Telephone Encounter (Signed)
Called and left Vm for Magda Paganini to fax paperwork for Walden Field, NP to take a look at.

## 2018-07-19 NOTE — Progress Notes (Signed)
Referring Provider: No ref. provider found Primary Care Physician:  Patient, No Pcp Per Primary GI:  Dr. Jena Gaussourk  Chief Complaint  Patient presents with  . Nausea    hospital f/u, feels sick when thinks about food; mouth dry  . Diarrhea    yellow, once/day    HPI:   Heather Oconnor is a 78 y.o. female who presents for hospital follow-up status post acute pancreatitis.  The patient was admitted from 06/12/2018 through 06/18/2018.  She initially presented to the ER with epigastric and generalized abdominal pain that began the day prior to presentation along with intermittent episodes of nausea and vomiting.  She was initially somewhat tachycardic and leukocytosis at 14.3, lipase elevated 1169, total bilirubin 2.1, AST/ALT 153/219.  CT of the abdomen and pelvis found acute pancreatitis as well as some underlying liver cirrhosis.  Gallbladder was distended but no biliary obstruction on further imaging.  No necrosis noted.  There is a question of gallstone pancreatitis.  Patient denied any alcohol prior to episodes.  Lipid panel without significant triglyceride elevation.  Lipase ended up normalizing and her diet was advanced to full liquids.  GI and general surgery recommended lap cholecystectomy for possible small gallstone/microlithiasis.  Also recommended liver biopsy at the time of surgery.  The patient and her husband could not decide whether or not to proceed with surgery and it was recommended that she be discharged home to further discuss as an outpatient.  It appears the patient was again admitted to the hospital, [facility] to [facility], from 07/04/2018 through 07/14/2018.  She presented with complaints of generalized weakness and anorexia failing to improve.  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  Again the question of cirrhosis was raised.  She was managed conservatively and clinically improved.  Tolerating tube  feeds and full liquid diet.  Recommended full liquid diet for several days and advance to low-fat diet.  Follow-up outpatient with her primary GI.  Consider repeat CT scan at that time to see if peripancreatic fluid collection still present.  Today she states she's not doing very well. Still with nausea with the thought of food, did have vomiting on the way to our office but typically rare to no vomiting. She has weakness, has been ordered home PT and nursing. Appetite is decreased. On an average day she is eating about 3 times a day, but doesn't eat much. Daughter has given her an Ensure type supplement and drinks about one a day. She has diarrhea which she describes as yellow, once a day, runny. Denies abdominal pain, hematochezia, melena, fever, chills. She has objectively lost about 30 lbs since December with a couple hospitalizations. She is able to do her daily ADLs, sometimes with assistance. Denies URI or flu-like symptoms. Denies chest pain, dyspnea, dizziness, lightheadedness, syncope, near syncope. Denies any other upper or lower GI symptoms.  Past Medical History:  Diagnosis Date  . Cholecystitis   . Cirrhosis (HCC)   . Diabetes (HCC)   . GERD (gastroesophageal reflux disease)   . Hemorrhoids   . Hypertension   . IBS (irritable bowel syndrome)   . Pancreatitis   . Weakness 07/2018    Past Surgical History:  Procedure Laterality Date  . COLONOSCOPY  09/07/2007   RMR: anal canal/external hemorrhoids, redundant colon, left-sided diverticula, otherwise normal colonic mucosa    Current Outpatient Medications  Medication Sig Dispense Refill  . acetaminophen (TYLENOL) 500 MG tablet Take  2 tablets (1,000 mg total) by mouth every 6 (six) hours as needed. 12 tablet 0  . albuterol (VENTOLIN HFA) 108 (90 Base) MCG/ACT inhaler Inhale 2 puffs into the lungs every 6 (six) hours as needed for wheezing or shortness of breath. 1 Inhaler 2  . amLODipine (NORVASC) 10 MG tablet Take 1 tablet (10 mg  total) by mouth daily. 30 tablet 5  . aspirin EC 81 MG tablet Take 1 tablet (81 mg total) by mouth daily with breakfast. 30 tablet 2  . clopidogrel (PLAVIX) 75 MG tablet Take 75 mg by mouth daily.     . DORZOLAMIDE HCL-TIMOLOL MAL OP Place 1 drop into the right eye daily.     . furosemide (LASIX) 40 MG tablet Take 1 tablet (40 mg total) by mouth daily. 30 tablet 1  . glipiZIDE (GLUCOTROL XL) 5 MG 24 hr tablet Take 1 tablet (5 mg total) by mouth daily. 30 tablet 1  . hydrALAZINE (APRESOLINE) 50 MG tablet Take 1 tablet (50 mg total) by mouth 3 (three) times daily. 90 tablet 2  . isosorbide mononitrate (IMDUR) 30 MG 24 hr tablet Take 1 tablet (30 mg total) by mouth daily. 30 tablet 2  . latanoprost (XALATAN) 0.005 % ophthalmic solution Place 1 drop into the right eye at bedtime.     . metFORMIN (GLUCOPHAGE) 500 MG tablet Take 1 tablet (500 mg total) by mouth 2 (two) times daily with a meal. 60 tablet 4  . ondansetron (ZOFRAN) 4 MG tablet Take 1 tablet (4 mg total) by mouth every 6 (six) hours as needed for nausea. 10 tablet 0  . pantoprazole (PROTONIX) 40 MG tablet Take 1 tablet (40 mg total) by mouth daily. 30 tablet 2  . umeclidinium bromide (INCRUSE ELLIPTA) 62.5 MCG/INH AEPB Inhale 1 puff into the lungs daily. 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     Allergies as of 07/19/2018 - Review Complete 07/19/2018  Allergen Reaction Noted  . Hctz [hydrochlorothiazide]  07/13/2018    Family History  Problem Relation Age of Onset  . Colon cancer Neg Hx     Social History   Socioeconomic History  . Marital status: Married    Spouse name: Not on file  . Number of children: Not on file  . Years of education: Not on file  . Highest education level: Not on file  Occupational History  . Not on file  Social Needs  . Financial resource strain: Not on file  . Food insecurity    Worry: Not on file    Inability: Not on file  . Transportation needs     Medical: Not on file    Non-medical: Not on file  Tobacco Use  . Smoking status: Former Smoker    Years: 9.00    Quit date: 11/27/2006    Years since quitting: 11.6  . Smokeless tobacco: Never Used  Substance and Sexual Activity  . Alcohol use: No    Alcohol/week: 0.0 standard drinks  . Drug use: No  . Sexual activity: Not on file  Lifestyle  . Physical activity    Days per week: Not on file    Minutes per session: Not on file  . Stress: Not on file  Relationships  . Social Musicianconnections    Talks on phone: Not on file    Gets together: Not on file    Attends religious service: Not on file    Active member of club or  organization: Not on file    Attends meetings of clubs or organizations: Not on file    Relationship status: Not on file  Other Topics Concern  . Not on file  Social History Narrative  . Not on file    Review of Systems: General: Negative for anorexia, weight loss, fever, chills. Admits weakness and fatigue. ENT: Negative for hoarseness, difficulty swallowing. CV: Negative for chest pain, angina, palpitations, peripheral edema.  Respiratory: Negative for dyspnea at rest, cough, sputum, wheezing.  GI: See history of present illness. Endo: Negative for unusual weight change.  Heme: Negative for bruising or bleeding. Allergy: Negative for rash or hives.   Physical Exam: BP 135/65   Pulse (!) 120   Temp 98.7 F (37.1 C) (Oral)   Ht 5\' 4"  (1.626 m)   Wt 157 lb 6.4 oz (71.4 kg)   BMI 27.02 kg/m  General:   Alert and oriented. Pleasant and cooperative. Well-nourished and well-developed.  Eyes:  Without icterus, sclera clear and conjunctiva pink.  Ears:  Normal auditory acuity. Cardiovascular:  S1, S2 present without murmurs appreciated. Extremities without clubbing or edema. Respiratory:  Clear to auscultation bilaterally. No wheezes, rales, or rhonchi. No distress.  Gastrointestinal:  +BS, soft, non-tender and non-distended. No HSM noted. No guarding or  rebound. No masses appreciated.  Rectal:  Deferred  Musculoskalatal:  Symmetrical without gross deformities. Neurologic:  Alert and oriented x4;  grossly normal neurologically. Psych:  Alert and cooperative. Normal mood and affect. Heme/Lymph/Immune: No excessive bruising noted.    07/19/2018 11:57 AM   Disclaimer: This note was dictated with voice recognition software. Similar sounding words can inadvertently be transcribed and may not be corrected upon review.

## 2018-07-19 NOTE — Telephone Encounter (Signed)
PA submitted for patient CT via [employer]. It is pending review. Clinicals faxed. Tracking# [id number]

## 2018-07-20 NOTE — Telephone Encounter (Signed)
PA was approved. Auth# [id number] dates 7/15-8/14/2020.  CT scheduled for 7/21 at 10:30am, arrival time 10:15am, water only 4 hours prior.  Patient aware of appt details. She voiced understanding.

## 2018-07-21 NOTE — Telephone Encounter (Signed)
Paperwork completed and given to staff to fax

## 2018-07-24 NOTE — Telephone Encounter (Signed)
Noted  

## 2018-07-25 ENCOUNTER — Ambulatory Visit (HOSPITAL_COMMUNITY)
Admission: RE | Admit: 2018-07-25 | Discharge: 2018-07-25 | Disposition: A | Discharge status: Home / Self Care | Payer: Medicare HMO | Source: Ambulatory Visit | Attending: Nurse Practitioner | Admitting: Nurse Practitioner

## 2018-07-25 ENCOUNTER — Other Ambulatory Visit: Payer: Self-pay

## 2018-07-25 ENCOUNTER — Telehealth: Payer: Self-pay | Admitting: *Deleted

## 2018-07-25 DIAGNOSIS — K862 Cyst of pancreas: Secondary | ICD-10-CM | POA: Diagnosis not present

## 2018-07-25 MED ORDER — IOHEXOL 300 MG/ML  SOLN
100.0000 mL | Freq: Once | INTRAMUSCULAR | Status: AC | PRN
  Administered 2018-07-25: 100 mL via INTRAVENOUS

## 2018-07-25 NOTE — Telephone Encounter (Signed)
Results called to patient (see result note).  Still with diarrhea. We can try pancreatic enzymes: based on available dosing and dose recommendations (500 units/kg/meal and 250 units/kg/snack) we will go with: 40,000 units/meal and 25,000 units/snack. Samples of Zenpep left with staff.  AM: Please call the patient and have someone come pick up samples for the patient. Take ONE 40,000 unit pill with every meal and ONE 25,000 unit pill with snacks between meals. Call us in 2 weeks with a progress report related to her diarrhea.

## 2018-07-25 NOTE — Telephone Encounter (Signed)
Noted. Spoke with pt and her son in law or spouse will stop by to pick medication samples up. There are two bags stapled together that separates the Zenpap dosages with instructions.

## 2018-07-25 NOTE — Telephone Encounter (Signed)
[facility] called regarding patient CT report. They are wanting you to review impression #1-3. Thanks

## 2018-07-27 ENCOUNTER — Telehealth: Payer: Self-pay | Admitting: Internal Medicine

## 2018-07-27 NOTE — Telephone Encounter (Signed)
EG, pt called this morning and had a question about the Zenpep. Pt doesn't eat  A lot during the day. Pt would like to know if she can take 1 Zenpep pill with Breakfast and 1 pill with dinner. She isn't able to eat lunch or snacks. Please advise.

## 2018-07-27 NOTE — Telephone Encounter (Signed)
If she eats, she should take a Zenpep. If she doesn't eat, she doesn't have to take it. It is pancreas enzyme capsules because her pancreas likely isn't functioning tip-top so taking the pill with food gives her body digestive enzyme so her food can be broken down and absorbed.

## 2018-07-27 NOTE — Telephone Encounter (Signed)
Pt has questions about her medication. [phone]

## 2018-07-27 NOTE — Telephone Encounter (Signed)
Noted. Pt notified of EG's recommendations.

## 2018-07-31 NOTE — Telephone Encounter (Signed)
PT left Vm to call her at [phone]. Routing to [place], this is RMR pt.

## 2018-07-31 NOTE — Telephone Encounter (Signed)
Spoke with pt. She is taking the Zenpep and states she is still having the diarrhea. Pt is taking medication three times a day due to not eating as much. Pt is aware that it may take a little while for the medication to get in her system. Pt states that the diarrhea has slowed down some. Pt had two bowel movements today and was able to make it to the bathroom. Previously, pt was able to make it to the bathroom before it was all over her clothing. Pt is aware that she may have to give the medication a little more time.

## 2018-08-01 ENCOUNTER — Other Ambulatory Visit: Payer: Self-pay

## 2018-08-01 NOTE — Progress Notes (Signed)
New Patient Office Visit  Assessment & Plan:  1. Type 2 diabetes mellitus without complication, without long-term current use of insulin (HCC) - Well controlled on current regimen.  Lab Results  Component Value Date   HGBA1C 6.3 08/02/2018   HGBA1C 6.7 (H) 07/05/2018   - Diabetes is at goal of A1c < 7. - Medications: continue current medications - Last foot exam: 08/02/2018 - Last diabetic eye exam: unknown - record requested from Dr. Edilia Bo - Urine Microalbumin/Creat Ratio: 06/02/17 - Instruction/counseling given: reminded to get eye exam and discussed foot care - Bayer DCA Hb A1c Waived - CMP14+EGFR - glipiZIDE (GLUCOTROL XL) 5 MG 24 hr tablet; Take 1 tablet (5 mg total) by mouth daily.  Dispense: 90 tablet; Refill: 1 - metFORMIN (GLUCOPHAGE) 500 MG tablet; Take 1 tablet (500 mg total) by mouth 2 (two) times daily with a meal.  Dispense: 180 tablet; Refill: 1  2. Essential hypertension - Well controlled on current regimen.  - amLODipine (NORVASC) 10 MG tablet; Take 1 tablet (10 mg total) by mouth daily.  Dispense: 90 tablet; Refill: 1 - furosemide (LASIX) 40 MG tablet; Take 1 tablet (40 mg total) by mouth daily.  Dispense: 90 tablet; Refill: 1 - hydrALAZINE (APRESOLINE) 50 MG tablet; Take 1 tablet (50 mg total) by mouth 3 (three) times daily.  Dispense: 270 tablet; Refill: 1 - isosorbide mononitrate (IMDUR) 30 MG 24 hr tablet; Take 1 tablet (30 mg total) by mouth daily.  Dispense: 90 tablet; Refill: 1  3. Chronic diastolic CHF (congestive heart failure) (HCC) - Well controlled on current regimen.  - CMP14+EGFR - amLODipine (NORVASC) 10 MG tablet; Take 1 tablet (10 mg total) by mouth daily.  Dispense: 90 tablet; Refill: 1 - furosemide (LASIX) 40 MG tablet; Take 1 tablet (40 mg total) by mouth daily.  Dispense: 90 tablet; Refill: 1 - hydrALAZINE (APRESOLINE) 50 MG tablet; Take 1 tablet (50 mg total) by mouth 3 (three) times daily.  Dispense: 270 tablet; Refill: 1 - isosorbide  mononitrate (IMDUR) 30 MG 24 hr tablet; Take 1 tablet (30 mg total) by mouth daily.  Dispense: 90 tablet; Refill: 1  4. Elevated LFTs/Needs Liver Biopsy - Patient has an appointment scheduled with GI.  - CMP14+EGFR  5. Abnormal TSH - Abnormal level in hospital. Re-checking today. - TSH  6. History of CVA in adulthood - clopidogrel (PLAVIX) 75 MG tablet; Take 1 tablet (75 mg total) by mouth daily.  Dispense: 90 tablet; Refill: 1  7. Chronic obstructive pulmonary disease, unspecified COPD type ([place]) - Well controlled on current regimen.  - umeclidinium bromide (INCRUSE ELLIPTA) 62.5 MCG/INH AEPB; Inhale 1 puff into the lungs daily. Dx- J44.1  Dispense: 90 each; Refill: 1  8. Gastroesophageal reflux disease with esophagitis - Well controlled on current regimen.  - pantoprazole (PROTONIX) 40 MG tablet; Take 1 tablet (40 mg total) by mouth daily.  Dispense: 90 tablet; Refill: 1  9. Generalized weakness - Ambulating with walker. Working with PT at home.  10. Pseudocyst of pancreas - Follow up with GI already scheduled.   11. Acute pancreatitis, unspecified complication status, unspecified pancreatitis type - Symptoms have resolved. Education provided to patient on a low fat diet; discussed advancing slowly as tolerated. Appointment already scheduled with GI.   12. Encounter to establish care - Patient declined vaccines and DEXA scan at this time since she is not 100%.    Follow-up: Return in about 3 months (around 11/02/2018) for follow-up of chronic medication conditions.  Hendricks Limes, MSN, APRN, FNP-C [facility]  Subjective:  Patient ID: Heather Oconnor, female    DOB: 08-03-1940  Age: 78 y.o. MRN: [id number]  Patient Care Team: Loman Brooklyn, FNP as PCP - General (Family Medicine) Gala Romney Cristopher Estimable, MD as Consulting Physician (Gastroenterology) Bond, Tracie Harrier, MD as Consulting Physician (Ophthalmology)  CC:  Chief Complaint  Patient  presents with  . New Patient (Initial Visit)    HPI Heather Oconnor presents to establish care. She is transferring care from Dr. Murrell Redden office as he has retired and the office has closed. Patient was also recently in the hospital for pancreatitis.   Patient is requesting oxygen be removed from her house. She was discharged from the hospital with home O2 with the expectation that she would improve and not need it. She has not been wearing oxygen for the past 4-5 days. States she has been checked numerous times with home health nurse and physical therapist and O2 saturation is always 96% both before and after exercise.   Patient has an appointment scheduled with Dr. Gala Romney on 09/05/2018 for a follow-up. She has been eating a very bland diet and only mushy foods. States she would really like to eat something else.    Depression screen [facility] 2/9 08/02/2018  Decreased Interest 0  Down, Depressed, Hopeless 0  PHQ - 2 Score 0  Altered sleeping 0  Tired, decreased energy 0  Change in appetite 0  Feeling bad or failure about yourself  0  Trouble concentrating 0  Moving slowly or fidgety/restless 0  PHQ-9 Score 0    Review of Systems  Constitutional: Negative for chills, fever, malaise/fatigue and weight loss.  HENT: Negative for congestion, ear discharge, ear pain, nosebleeds, sinus pain, sore throat and tinnitus.   Eyes: Negative for blurred vision, double vision, pain, discharge and redness.  Respiratory: Negative for cough, shortness of breath and wheezing.   Cardiovascular: Negative for chest pain, palpitations and leg swelling.  Gastrointestinal: Negative for abdominal pain, constipation, diarrhea, heartburn, nausea and vomiting.  Genitourinary: Negative for dysuria, frequency and urgency.  Musculoskeletal: Negative for myalgias.  Skin: Negative for rash.  Neurological: Positive for weakness. Negative for dizziness, seizures and headaches.  Psychiatric/Behavioral: Negative for  depression, substance abuse and suicidal ideas. The patient is not nervous/anxious.      Current Outpatient Medications:  .  acetaminophen (TYLENOL) 500 MG tablet, Take 2 tablets (1,000 mg total) by mouth every 6 (six) hours as needed., Disp: 12 tablet, Rfl: 0 .  albuterol (VENTOLIN HFA) 108 (90 Base) MCG/ACT inhaler, Inhale 2 puffs into the lungs every 6 (six) hours as needed for wheezing or shortness of breath., Disp: 1 Inhaler, Rfl: 2 .  amLODipine (NORVASC) 10 MG tablet, Take 1 tablet (10 mg total) by mouth daily., Disp: 90 tablet, Rfl: 1 .  aspirin EC 81 MG tablet, Take 1 tablet (81 mg total) by mouth daily with breakfast., Disp: 30 tablet, Rfl: 2 .  clopidogrel (PLAVIX) 75 MG tablet, Take 1 tablet (75 mg total) by mouth daily., Disp: 90 tablet, Rfl: 1 .  DORZOLAMIDE HCL-TIMOLOL MAL OP, Place 1 drop into the right eye daily. , Disp: , Rfl:  .  furosemide (LASIX) 40 MG tablet, Take 1 tablet (40 mg total) by mouth daily., Disp: 90 tablet, Rfl: 1 .  glipiZIDE (GLUCOTROL XL) 5 MG 24 hr tablet, Take 1 tablet (5 mg total) by mouth daily., Disp: 90 tablet, Rfl: 1 .  hydrALAZINE (APRESOLINE) 50  MG tablet, Take 1 tablet (50 mg total) by mouth 3 (three) times daily., Disp: 270 tablet, Rfl: 1 .  isosorbide mononitrate (IMDUR) 30 MG 24 hr tablet, Take 1 tablet (30 mg total) by mouth daily., Disp: 90 tablet, Rfl: 1 .  latanoprost (XALATAN) 0.005 % ophthalmic solution, Place 1 drop into the right eye at bedtime. , Disp: , Rfl:  .  metFORMIN (GLUCOPHAGE) 500 MG tablet, Take 1 tablet (500 mg total) by mouth 2 (two) times daily with a meal., Disp: 180 tablet, Rfl: 1 .  Pancrelipase, Lip-Prot-Amyl, (ZENPEP) 40000-126000 units CPEP, Take by mouth., Disp: , Rfl:  .  pantoprazole (PROTONIX) 40 MG tablet, Take 1 tablet (40 mg total) by mouth daily., Disp: 90 tablet, Rfl: 1 .  umeclidinium bromide (INCRUSE ELLIPTA) 62.5 MCG/INH AEPB, Inhale 1 puff into the lungs daily. Dx- J44.1, Disp: 90 each, Rfl: 1  Allergies   Allergen Reactions  . Hctz [Hydrochlorothiazide]     Per GI may have caused pancreatitis. Dr. Ardis Hughs recommended never resuming.    Past Medical History:  Diagnosis Date  . Blind right eye   . Cholecystitis   . Chronic diastolic CHF (congestive heart failure) ([place]) 07/04/2018  . Cirrhosis ([place])   . COPD (chronic obstructive pulmonary disease) ([place]) 07/04/2018  . Diabetes ([place])   . GERD (gastroesophageal reflux disease)   . Hemorrhoids   . Hypertension   . IBS (irritable bowel syndrome)   . Pancreatitis   . Transient cerebral ischemia 05/08/2008   Qualifier: Diagnosis of  By: Zeb Comfort    . Weakness 07/2018    Past Surgical History:  Procedure Laterality Date  . COLONOSCOPY  09/07/2007   RMR: anal canal/external hemorrhoids, redundant colon, left-sided diverticula, otherwise normal colonic mucosa    Family History  Problem Relation Age of Onset  . Hypertension Mother   . Hypertension Father   . Hypertension Sister   . Hypertension Brother   . Hypertension Brother   . Diabetes Son   . Hypertension Son   . Colon cancer Neg Hx     Social History   Socioeconomic History  . Marital status: Married    Spouse name: Not on file  . Number of children: Not on file  . Years of education: Not on file  . Highest education level: Not on file  Occupational History  . Not on file  Social Needs  . Financial resource strain: Not on file  . Food insecurity    Worry: Not on file    Inability: Not on file  . Transportation needs    Medical: Not on file    Non-medical: Not on file  Tobacco Use  . Smoking status: Former Smoker    Years: 9.00    Quit date: 11/27/2006    Years since quitting: 11.6  . Smokeless tobacco: Never Used  Substance and Sexual Activity  . Alcohol use: No    Alcohol/week: 0.0 standard drinks  . Drug use: No  . Sexual activity: Not Currently  Lifestyle  . Physical activity    Days per week: Not on file    Minutes per session: Not on file  .  Stress: Not on file  Relationships  . Social Herbalist on phone: Not on file    Gets together: Not on file    Attends religious service: Not on file    Active member of club or organization: Not on file    Attends meetings of clubs or organizations: Not  on file    Relationship status: Not on file  . Intimate partner violence    Fear of current or ex partner: Not on file    Emotionally abused: Not on file    Physically abused: Not on file    Forced sexual activity: Not on file  Other Topics Concern  . Not on file  Social History Narrative  . Not on file    Objective:   Today's Vitals: BP 138/80   Pulse (!) 113   Temp 98.6 F (37 C) (Oral)   Ht 5' 4" (1.626 m)   Wt 152 lb (68.9 kg)   BMI 26.09 kg/m   Physical Exam Vitals signs reviewed.  Constitutional:      General: She is not in acute distress.    Appearance: Normal appearance. She is not ill-appearing, toxic-appearing or diaphoretic.  HENT:     Head: Normocephalic and atraumatic.     Right Ear: Tympanic membrane, ear canal and external ear normal. There is no impacted cerumen.     Left Ear: Tympanic membrane, ear canal and external ear normal. There is no impacted cerumen.     Nose: Nose normal. No congestion or rhinorrhea.     Mouth/Throat:     Mouth: Mucous membranes are moist.     Pharynx: Oropharynx is clear. No oropharyngeal exudate or posterior oropharyngeal erythema.  Eyes:     General: No scleral icterus.       Right eye: No discharge.        Left eye: No discharge.     Conjunctiva/sclera: Conjunctivae normal.     Pupils: Pupils are unequal.     Right eye: Pupil is not reactive. Pupil is round.     Left eye: Pupil is round, reactive and not sluggish.  Neck:     Musculoskeletal: Normal range of motion and neck supple. No neck rigidity or muscular tenderness.  Cardiovascular:     Rate and Rhythm: Normal rate and regular rhythm.     Heart sounds: Normal heart sounds. No murmur. No friction rub.  No gallop.   Pulmonary:     Effort: Pulmonary effort is normal. No respiratory distress.     Breath sounds: Normal breath sounds. No stridor. No wheezing, rhonchi or rales.  Abdominal:     General: Abdomen is flat. Bowel sounds are normal. There is no distension.     Palpations: Abdomen is soft. There is no mass.     Tenderness: There is no abdominal tenderness. There is no guarding or rebound.     Hernia: No hernia is present.  Musculoskeletal: Normal range of motion.  Lymphadenopathy:     Cervical: No cervical adenopathy.  Skin:    General: Skin is warm and dry.     Capillary Refill: Capillary refill takes less than 2 seconds.  Neurological:     General: No focal deficit present.     Mental Status: She is alert and oriented to person, place, and time. Mental status is at baseline.     Gait: Gait abnormal (ambulating with walker).  Psychiatric:        Mood and Affect: Mood normal.        Behavior: Behavior normal.        Thought Content: Thought content normal.        Judgment: Judgment normal.     Diabetic Foot Exam - Simple   Simple Foot Form Diabetic Foot exam was performed with the following findings: Yes 08/02/2018  3:30 PM  Visual Inspection No deformities, no ulcerations, no other skin breakdown bilaterally: Yes Sensation Testing Intact to touch and monofilament testing bilaterally: Yes Pulse Check Posterior Tibialis and Dorsalis pulse intact bilaterally: Yes Comments

## 2018-08-01 NOTE — Telephone Encounter (Signed)
Pt is still having diarrhea but feels she's had less stool. The diarrhea is a current concern for pt and she doesn't know if there is anything else she needs to take. Pt feels the medication Zenpep isn't helping but she is having fewer episodes of diarrhea.

## 2018-08-01 NOTE — Telephone Encounter (Signed)
I'm glad she's heading in the right direction. Let's continue for another week and see how she does early next week. (trying to avoid loading her up on medications if we can avoid it.) If still having some diarrhea at that point we can look and see if we need to add anything else to help. As always, call if any problems.

## 2018-08-02 ENCOUNTER — Ambulatory Visit (INDEPENDENT_AMBULATORY_CARE_PROVIDER_SITE_OTHER): Payer: Medicare HMO | Admitting: Family Medicine

## 2018-08-02 ENCOUNTER — Encounter: Payer: Self-pay | Admitting: Family Medicine

## 2018-08-02 VITALS — BP 138/80 | HR 113 | Temp 98.6°F | Ht 64.0 in | Wt 152.0 lb

## 2018-08-02 DIAGNOSIS — Z7689 Persons encountering health services in other specified circumstances: Secondary | ICD-10-CM

## 2018-08-02 DIAGNOSIS — I10 Essential (primary) hypertension: Secondary | ICD-10-CM

## 2018-08-02 DIAGNOSIS — J449 Chronic obstructive pulmonary disease, unspecified: Secondary | ICD-10-CM

## 2018-08-02 DIAGNOSIS — R7989 Other specified abnormal findings of blood chemistry: Secondary | ICD-10-CM

## 2018-08-02 DIAGNOSIS — E119 Type 2 diabetes mellitus without complications: Secondary | ICD-10-CM

## 2018-08-02 DIAGNOSIS — I5032 Chronic diastolic (congestive) heart failure: Secondary | ICD-10-CM

## 2018-08-02 DIAGNOSIS — K2100 Gastro-esophageal reflux disease with esophagitis, without bleeding: Secondary | ICD-10-CM

## 2018-08-02 DIAGNOSIS — K859 Acute pancreatitis without necrosis or infection, unspecified: Secondary | ICD-10-CM

## 2018-08-02 DIAGNOSIS — R945 Abnormal results of liver function studies: Secondary | ICD-10-CM | POA: Diagnosis not present

## 2018-08-02 DIAGNOSIS — R531 Weakness: Secondary | ICD-10-CM

## 2018-08-02 DIAGNOSIS — K863 Pseudocyst of pancreas: Secondary | ICD-10-CM

## 2018-08-02 DIAGNOSIS — Z8673 Personal history of transient ischemic attack (TIA), and cerebral infarction without residual deficits: Secondary | ICD-10-CM

## 2018-08-02 LAB — BAYER DCA HB A1C WAIVED: HB A1C (BAYER DCA - WAIVED): 6.3 %

## 2018-08-02 NOTE — Telephone Encounter (Signed)
Have her add Imodium 2-3 times a day, per bottle instructions. Call us back in a couple days if it isn't helping and I can send in Bentyl. If she would prefer to try Bentyl first let me know.

## 2018-08-02 NOTE — Patient Instructions (Addendum)
Find out name of eye doctor.   Gallbladder Eating Plan If you have a gallbladder condition, you may have trouble digesting fats. Eating a low-fat diet can help reduce your symptoms, and may be helpful before and after having surgery to remove your gallbladder (cholecystectomy). Your health care provider may recommend that you work with a diet and nutrition specialist (dietitian) to help you reduce the amount of fat in your diet. What are tips for following this plan? General guidelines  Limit your fat intake to less than 30% of your total daily calories. If you eat around 1,800 calories each day, this is less than 60 grams (g) of fat per day.  Fat is an important part of a healthy diet. Eating a low-fat diet can make it hard to maintain a healthy body weight. Ask your dietitian how much fat, calories, and other nutrients you need each day.  Eat small, frequent meals throughout the day instead of three large meals.  Drink at least 8-10 cups of fluid a day. Drink enough fluid to keep your urine clear or pale yellow.  Limit alcohol intake to no more than 1 drink a day for nonpregnant women and 2 drinks a day for men. One drink equals 12 oz of beer, 5 oz of wine, or 1 oz of hard liquor. Reading food labels  Check Nutrition Facts on food labels for the amount of fat per serving. Choose foods with less than 3 grams of fat per serving. Shopping  Choose nonfat and low-fat healthy foods. Look for the words "nonfat," "low fat," or "fat free."  Avoid buying processed or prepackaged foods. Cooking  Cook using low-fat methods, such as baking, broiling, grilling, or boiling.  Cook with small amounts of healthy fats, such as olive oil, grapeseed oil, canola oil, or sunflower oil. What foods are recommended?   All fresh, frozen, or canned fruits and vegetables.  Whole grains.  Low-fat or non-fat (skim) milk and yogurt.  Lean meat, skinless poultry, fish, eggs, and beans.  Low-fat protein  supplement powders or drinks.  Spices and herbs. What foods are not recommended?  High-fat foods. These include baked goods, fast food, fatty cuts of meat, ice cream, french toast, sweet rolls, pizza, cheese bread, foods covered with butter, creamy sauces, or cheese.  Fried foods. These include french fries, tempura, battered fish, breaded chicken, fried breads, and sweets.  Foods with strong odors.  Foods that cause bloating and gas. Summary  A low-fat diet can be helpful if you have a gallbladder condition, or before and after gallbladder surgery.  Limit your fat intake to less than 30% of your total daily calories. This is about 60 g of fat if you eat 1,800 calories each day.  Eat small, frequent meals throughout the day instead of three large meals. This information is not intended to replace advice given to you by your health care provider. Make sure you discuss any questions you have with your health care provider. Document Released: 12/26/2012 Document Revised: 04/13/2018 Document Reviewed: 01/29/2016 Elsevier Patient Education  2020 [employer].

## 2018-08-03 ENCOUNTER — Encounter: Payer: Self-pay | Admitting: Family Medicine

## 2018-08-03 LAB — CMP14+EGFR
ALT: 7 IU/L (ref 0–32)
AST: 12 IU/L (ref 0–40)
Albumin/Globulin Ratio: 1.3 (ref 1.2–2.2)
Albumin: 3.5 g/dL — ABNORMAL LOW (ref 3.7–4.7)
Alkaline Phosphatase: 50 IU/L (ref 39–117)
BUN/Creatinine Ratio: 10 — ABNORMAL LOW (ref 12–28)
BUN: 8 mg/dL (ref 8–27)
Bilirubin Total: 0.3 mg/dL (ref 0.0–1.2)
CO2: 25 mmol/L (ref 20–29)
Calcium: 8.4 mg/dL — ABNORMAL LOW (ref 8.7–10.3)
Chloride: 96 mmol/L (ref 96–106)
Creatinine, Ser: 0.82 mg/dL (ref 0.57–1.00)
GFR calc Af Amer: 80 mL/min/{1.73_m2} (ref >59)
GFR calc non Af Amer: 69 mL/min/{1.73_m2} (ref >59)
Globulin, Total: 2.7 g/dL (ref 1.5–4.5)
Glucose: 137 mg/dL — ABNORMAL HIGH (ref 65–99)
Potassium: 3.5 mmol/L (ref 3.5–5.2)
Sodium: 138 mmol/L (ref 134–144)
Total Protein: 6.2 g/dL (ref 6.0–8.5)

## 2018-08-03 LAB — TSH: TSH: 1.21 u[IU]/mL (ref 0.450–4.500)

## 2018-08-03 MED ORDER — UMECLIDINIUM BROMIDE 62.5 MCG/INH IN AEPB: 1.0000 | INHALATION_SPRAY | Freq: Every day | RESPIRATORY_TRACT | 1 refills | Status: DC

## 2018-08-03 MED ORDER — METFORMIN HCL 500 MG PO TABS: 500.0000 mg | ORAL_TABLET | Freq: Two times a day (BID) | ORAL | 1 refills | Status: DC

## 2018-08-03 MED ORDER — FUROSEMIDE 40 MG PO TABS: 40.0000 mg | ORAL_TABLET | Freq: Every day | ORAL | 1 refills | Status: DC

## 2018-08-03 MED ORDER — GLIPIZIDE ER 5 MG PO TB24: 5.0000 mg | ORAL_TABLET | Freq: Every day | ORAL | 1 refills | Status: DC

## 2018-08-03 MED ORDER — HYDRALAZINE HCL 50 MG PO TABS: 50.0000 mg | ORAL_TABLET | Freq: Three times a day (TID) | ORAL | 1 refills | Status: DC

## 2018-08-03 MED ORDER — AMLODIPINE BESYLATE 10 MG PO TABS: 10.0000 mg | ORAL_TABLET | Freq: Every day | ORAL | 1 refills | Status: DC

## 2018-08-03 MED ORDER — CLOPIDOGREL BISULFATE 75 MG PO TABS: 75.0000 mg | ORAL_TABLET | Freq: Every day | ORAL | 1 refills | Status: DC

## 2018-08-03 MED ORDER — PANTOPRAZOLE SODIUM 40 MG PO TBEC: 40.0000 mg | DELAYED_RELEASE_TABLET | Freq: Every day | ORAL | 1 refills | Status: DC

## 2018-08-03 MED ORDER — ISOSORBIDE MONONITRATE ER 30 MG PO TB24: 30.0000 mg | ORAL_TABLET | Freq: Every day | ORAL | 1 refills | Status: DC

## 2018-08-03 NOTE — Telephone Encounter (Signed)
Pt notified of EG's recommendations. Pt will call back if needed.

## 2018-08-03 NOTE — Addendum Note (Signed)
Addended by: Loman Brooklyn on: 08/03/2018 02:37 PM   Modules accepted: Orders

## 2018-08-04 ENCOUNTER — Encounter: Payer: Self-pay | Admitting: Family Medicine

## 2018-08-08 ENCOUNTER — Telehealth: Payer: Self-pay | Admitting: Internal Medicine

## 2018-08-08 DIAGNOSIS — K859 Acute pancreatitis without necrosis or infection, unspecified: Secondary | ICD-10-CM

## 2018-08-08 DIAGNOSIS — K863 Pseudocyst of pancreas: Secondary | ICD-10-CM

## 2018-08-08 MED ORDER — CREON 24000-76000 UNITS PO CPEP: ORAL_CAPSULE | ORAL | 3 refills | Status: DC

## 2018-08-08 NOTE — Telephone Encounter (Signed)
Pt notified and will contact our office if medication isn't covered under her insurance.

## 2018-08-08 NOTE — Addendum Note (Signed)
Addended by: Gordy Levan, ERIC A on: 08/08/2018 12:36 PM   Modules accepted: Orders

## 2018-08-08 NOTE — Telephone Encounter (Addendum)
I'm glad she's doing better!  I will send in an Rx. From what I can see in Epic, it appears Creon is preferred over Zenpep (same medication, different name). I'll send in an Rx of Creon:  24,000 units; take TWO capsules with meals and ONE with snacks. I'll send the Rx with assumption of 3 meals and 2 snacks a day (total 8 pills daily)  I'm not sure what her cost would be (it varies insurance to insurance). However, I think Creon has a patient assistance program. Let us know if it's too expensive and we can work on helping that

## 2018-08-08 NOTE — Telephone Encounter (Signed)
Spoke with pt. She was taking Zenpep samples and her diarrhea has improved a lot. Pt isn't sure if EG would like her to continue medication. If so, pt will need an RX sent to her pharmacy. Pt also wants to know if the price is too expensive, if she can try a different medication.

## 2018-08-08 NOTE — Telephone Encounter (Signed)
ZENPEP SAMPLES WERE GIVEN AT LAST APPOINTMENT, SHE USES THE DRUG STORE IN [place].  SAID THEY WORKED WELL, DOES SHE NEED TO CONTINUE TAKING THEM? WANTED TO KNOW IF THEY ARE CALLED IN AND ARE TOO EXPENSIVE CAN THEY POSSIBLY TRY SOMETHING ELSE.  I TOLD HER WE WOULD LET HER KNOW  [phone]

## 2018-08-09 ENCOUNTER — Telehealth: Payer: Self-pay | Admitting: Internal Medicine

## 2018-08-09 NOTE — Telephone Encounter (Signed)
PATIENT CALLED AND SAID THAT SHE HAS A QUESTION ABOUT HER NEW MEDS THAT WERE CALLED IN YESTERDAY

## 2018-08-09 NOTE — Telephone Encounter (Signed)
Spoke with pt. Pt is aware of how she should take her Creon 2 capsules with breakfast, lunch and dinner, 1 pill with snacks.

## 2018-08-17 ENCOUNTER — Telehealth: Payer: Self-pay | Admitting: *Deleted

## 2018-08-17 NOTE — Telephone Encounter (Signed)
[facility] is following up on orders that they faxed on 08/11/2018 for pt.  Malinda with [facility] called. [phone]

## 2018-08-17 NOTE — Telephone Encounter (Signed)
Lmom, waiting on a return call from [facility]

## 2018-08-21 NOTE — Telephone Encounter (Signed)
lmom for Malinda. waiting on a return call.

## 2018-08-21 NOTE — Telephone Encounter (Signed)
Places on [employer]. They have RMR specified so I don't think I can sign them.

## 2018-08-21 NOTE — Telephone Encounter (Signed)
Routing to EG, orders will be placed on your desk.

## 2018-08-21 NOTE — Telephone Encounter (Signed)
Noted  

## 2018-08-21 NOTE — Telephone Encounter (Signed)
Spoke with Malinda from [facility]. Pt was discharged on 08/17/2018. The orders that needed signatures were for previous treatment. Malinda wants to close the pt's file out. She is asking that we return the faxed orders for previous care.

## 2018-08-21 NOTE — Telephone Encounter (Signed)
Routing...

## 2018-09-05 ENCOUNTER — Other Ambulatory Visit: Payer: Self-pay

## 2018-09-05 ENCOUNTER — Encounter: Payer: Self-pay | Admitting: Internal Medicine

## 2018-09-05 ENCOUNTER — Ambulatory Visit (INDEPENDENT_AMBULATORY_CARE_PROVIDER_SITE_OTHER): Payer: Medicare HMO | Admitting: Internal Medicine

## 2018-09-05 VITALS — BP 186/92 | HR 103 | Temp 97.1°F | Ht 66.0 in | Wt 151.2 lb

## 2018-09-05 DIAGNOSIS — K859 Acute pancreatitis without necrosis or infection, unspecified: Secondary | ICD-10-CM | POA: Diagnosis not present

## 2018-09-05 NOTE — Patient Instructions (Signed)
May liberalize diet to heart health (information provided)  Continue Creon daily along with Protonix  OV here in 3 months (LFTs before next OV)  As discussed, no need for urgent gallbladder removal at this time so long as you do well;  May or may not need one more CT of your pancreas

## 2018-09-06 NOTE — Progress Notes (Signed)
Primary Care Physician:  Gwenlyn Fudge, FNP Primary Gastroenterologist:  Dr. Jena Gauss  Pre-Procedure History & Physical: HPI:  Heather Oconnor is a 78 y.o. female here for follow-up of pancreatitis felt to be biliary in origin.  Somewhat protracted course.  Pseudocyst formation on CT which appeared to be improved with last CT scan.  Abdominal pain is totally resolved.  No diarrhea on Creon tired of a low-fat diet wants to be able to liberalize intake.  She has not had any fever or chills.  No biliary dilation no stone seen in her hepatobiliary tree on prior imaging.  LFTs recently completely normal.  Some contemplation of a cholecystectomy but no further action has been taken.  Past Medical History:  Diagnosis Date  . Blind right eye   . Cholecystitis   . Chronic diastolic CHF (congestive heart failure) (HCC) 07/04/2018  . Cirrhosis (HCC)   . COPD (chronic obstructive pulmonary disease) (HCC) 07/04/2018  . Diabetes (HCC)   . GERD (gastroesophageal reflux disease)   . Hemorrhoids   . Hypertension   . IBS (irritable bowel syndrome)   . Pancreatitis   . Transient cerebral ischemia 05/08/2008   Qualifier: Diagnosis of  By: Diana Eves    . Weakness 07/2018    Past Surgical History:  Procedure Laterality Date  . COLONOSCOPY  09/07/2007   RMR: anal canal/external hemorrhoids, redundant colon, left-sided diverticula, otherwise normal colonic mucosa    Prior to Admission medications   Medication Sig Start Date End Date Taking? Authorizing Provider  acetaminophen (TYLENOL) 500 MG tablet Take 2 tablets (1,000 mg total) by mouth every 6 (six) hours as needed. 06/18/18  Yes Emokpae, Courage, MD  amLODipine (NORVASC) 10 MG tablet Take 1 tablet (10 mg total) by mouth daily. 08/03/18  Yes Deliah Boston F, FNP  aspirin EC 81 MG tablet Take 1 tablet (81 mg total) by mouth daily with breakfast. 06/18/18 06/18/19 Yes Emokpae, Courage, MD  clopidogrel (PLAVIX) 75 MG tablet Take 1 tablet (75 mg  total) by mouth daily. 08/03/18  Yes Deliah Boston F, FNP  furosemide (LASIX) 40 MG tablet Take 1 tablet (40 mg total) by mouth daily. 08/03/18  Yes Deliah Boston F, FNP  glipiZIDE (GLUCOTROL XL) 5 MG 24 hr tablet Take 1 tablet (5 mg total) by mouth daily. 08/03/18  Yes Deliah Boston F, FNP  hydrALAZINE (APRESOLINE) 50 MG tablet Take 1 tablet (50 mg total) by mouth 3 (three) times daily. 08/03/18  Yes Deliah Boston F, FNP  isosorbide mononitrate (IMDUR) 30 MG 24 hr tablet Take 1 tablet (30 mg total) by mouth daily. 08/03/18  Yes Gwenlyn Fudge, FNP  latanoprost (XALATAN) 0.005 % ophthalmic solution Place 1 drop into the right eye at bedtime.  10/24/17 10/24/18 Yes [provider]  metFORMIN (GLUCOPHAGE) 500 MG tablet Take 1 tablet (500 mg total) by mouth 2 (two) times daily with a meal. 08/03/18  Yes Deliah Boston F, FNP  Pancrelipase, Lip-Prot-Amyl, (CREON) 24000-76000 units CPEP Take 2 capsules (48,000 Units total) by mouth 3 (three) times daily with meals AND 1 capsule (24,000 Units total) with snacks. 08/08/18  Yes Anice Paganini, NP  pantoprazole (PROTONIX) 40 MG tablet Take 1 tablet (40 mg total) by mouth daily. 08/03/18  Yes Deliah Boston F, FNP  DORZOLAMIDE HCL-TIMOLOL MAL OP Place 1 drop into the right eye daily.     [provider]    Allergies as of 09/05/2018 - Review Complete 09/05/2018  Allergen Reaction Noted  .  Hctz [hydrochlorothiazide]  07/13/2018    Family History  Problem Relation Age of Onset  . Hypertension Mother   . Hypertension Father   . Hypertension Sister   . Hypertension Brother   . Hypertension Brother   . Diabetes Son   . Hypertension Son   . Colon cancer Neg Hx     Social History   Socioeconomic History  . Marital status: Married    Spouse name: Not on file  . Number of children: Not on file  . Years of education: Not on file  . Highest education level: Not on file  Occupational History  . Not on file  Social Needs  . Financial  resource strain: Not on file  . Food insecurity    Worry: Not on file    Inability: Not on file  . Transportation needs    Medical: Not on file    Non-medical: Not on file  Tobacco Use  . Smoking status: Former Smoker    Years: 9.00    Quit date: 11/27/2006    Years since quitting: 11.7  . Smokeless tobacco: Never Used  Substance and Sexual Activity  . Alcohol use: No    Alcohol/week: 0.0 standard drinks  . Drug use: No  . Sexual activity: Not Currently  Lifestyle  . Physical activity    Days per week: Not on file    Minutes per session: Not on file  . Stress: Not on file  Relationships  . Social Musicianconnections    Talks on phone: Not on file    Gets together: Not on file    Attends religious service: Not on file    Active member of club or organization: Not on file    Attends meetings of clubs or organizations: Not on file    Relationship status: Not on file  . Intimate partner violence    Fear of current or ex partner: Not on file    Emotionally abused: Not on file    Physically abused: Not on file    Forced sexual activity: Not on file  Other Topics Concern  . Not on file  Social History Narrative  . Not on file    Review of Systems: See HPI, otherwise negative ROS  Physical Exam: BP (!) 186/92   Pulse (!) 103   Temp (!) 97.1 F (36.2 C) (Oral)   Ht 5\' 6"  (1.676 m)   Wt 151 lb 3.2 oz (68.6 kg)   BMI 24.40 kg/m  General:   Alert, pleasant and cooperative in NAD Mouth:  No deformity or lesions. Neck:  Supple; no masses or thyromegaly. No significant cervical adenopathy. Lungs:  Clear throughout to auscultation.   No wheezes, crackles, or rhonchi. No acute distress. Heart:  Regular rate and rhythm; no murmurs, clicks, rubs,  or gallops. Abdomen: Non-distended, normal bowel sounds.  Soft and nontender without appreciable mass or hepatosplenomegaly.  Pulses:  Normal pulses noted. Extremities:  Without clubbing or edema.  Impression/Plan: 78 year old lady with  a recent pancreatitis complicated by pseudocyst formation.  Interim follow-up CT demonstrates improvement in pseudocyst.  This lady currently is essentially devoid of any GI tract symptoms.  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  LFTs are now normal no evidence of stone disease on imaging.  At this point, cholecystectomy may not be needed .    Recommendations:  May liberalize diet to heart healthy (information provided)  Continue Creon  daily along with Protonix  OV here in 3 months (LFTs before next OV)  As discussed, no need for urgent gallbladder removal at this time so long as clinically well;  May or may not need one more CT of your pancreas    Notice: This dictation was prepared with Dragon dictation along with smaller phrase technology. Any transcriptional errors that result from this process are unintentional and may not be corrected upon review.

## 2018-09-27 DIAGNOSIS — H44511 Absolute glaucoma, right eye: Secondary | ICD-10-CM | POA: Diagnosis not present

## 2018-09-27 DIAGNOSIS — H401413 Capsular glaucoma with pseudoexfoliation of lens, right eye, severe stage: Secondary | ICD-10-CM | POA: Diagnosis not present

## 2018-10-11 ENCOUNTER — Telehealth: Payer: Self-pay | Admitting: Internal Medicine

## 2018-10-11 NOTE — Telephone Encounter (Signed)
EG pt was getting her Creon for $45.00 monthly. She is currently in the donut hole and will have to pay $300.00 for the next 3 months. Pt wants to know if she needs to continue this medication? Please advise.

## 2018-10-11 NOTE — Telephone Encounter (Signed)
Pt called to make her 3 mo follow up with RMR and asked to speak with nurse about her medicine. [phone]

## 2018-10-12 NOTE — Telephone Encounter (Signed)
Noted. Spoke with pt. I've filled out Pt assistance form. Waiting on a return signature from EG. When received, it will be faxed.

## 2018-10-12 NOTE — Telephone Encounter (Signed)
The Creon is likely helping her diarrhea stay at [facility]. Can we reach out to the rep and see if there's any patient assistance available with Medicare?  If she can't afford it and needs to stop, I understand. However, she may have a return of her symptoms.

## 2018-11-01 ENCOUNTER — Other Ambulatory Visit: Payer: Self-pay

## 2018-11-02 ENCOUNTER — Encounter: Payer: Self-pay | Admitting: Family Medicine

## 2018-11-02 ENCOUNTER — Ambulatory Visit (INDEPENDENT_AMBULATORY_CARE_PROVIDER_SITE_OTHER): Payer: Medicare HMO | Admitting: Family Medicine

## 2018-11-02 VITALS — BP 187/85 | HR 106 | Temp 99.2°F | Ht 66.0 in | Wt 158.0 lb

## 2018-11-02 DIAGNOSIS — J439 Emphysema, unspecified: Secondary | ICD-10-CM

## 2018-11-02 DIAGNOSIS — L659 Nonscarring hair loss, unspecified: Secondary | ICD-10-CM | POA: Diagnosis not present

## 2018-11-02 DIAGNOSIS — K2100 Gastro-esophageal reflux disease with esophagitis, without bleeding: Secondary | ICD-10-CM

## 2018-11-02 DIAGNOSIS — I10 Essential (primary) hypertension: Secondary | ICD-10-CM | POA: Diagnosis not present

## 2018-11-02 DIAGNOSIS — E119 Type 2 diabetes mellitus without complications: Secondary | ICD-10-CM

## 2018-11-02 DIAGNOSIS — I5032 Chronic diastolic (congestive) heart failure: Secondary | ICD-10-CM | POA: Diagnosis not present

## 2018-11-02 DIAGNOSIS — K863 Pseudocyst of pancreas: Secondary | ICD-10-CM

## 2018-11-02 LAB — BAYER DCA HB A1C WAIVED: HB A1C (BAYER DCA - WAIVED): 7.1 % — ABNORMAL HIGH (ref <7.0)

## 2018-11-02 NOTE — Patient Instructions (Signed)

## 2018-11-02 NOTE — Progress Notes (Signed)
Assessment & Plan:  1. Essential hypertension - Uncontrolled. Patient was placed on Imdur in the hospital; she denies experiencing any chest pain at any time. She has not been taking hydralazine TID; she is only taking it BID in the morning and night (pure accident). Amlodipine is maxed at 10 mg QD. Metoprolol, Lisinopril, and HCTZ were discontinued in the hospital. Furosemide was rx'd in the hospital when she was overloaded with IV fluids and diagnosed with heart failure.  - Patient is going to increase hydralazine and take as prescribed TID. She is going to stop Imdur and change furosemide to QD PRN for edema (which she has not experienced). I have advised her to check her BP and HR at least 1 hour after she has had her medications and keep a log. She is going to call me if consistently elevated before her next appointment. Lisinopril and HCTZ are no longer options due to pancreatitis. If heart rate is consistently > 100 metoprolol can be added back, otherwise Losartan can be added if needed. Education provided on the DASH diet.   2. Type 2 diabetes mellitus without complication, without long-term current use of insulin (HCC) Lab Results  Component Value Date   HGBA1C 7.1 (H) 11/02/2018   HGBA1C 6.3 08/02/2018   HGBA1C 6.7 (H) 07/05/2018  - Diabetes is at goal of A1c 7. - Medications: continue current medications - Home glucose monitoring: daily fasting - Patient is not currently taking a statin. Patient is not taking an ACE-inhibitor/ARB.  - Last foot exam: 08/01/2000 - Last diabetic eye exam: requested from Dr. Lottie Dawson - Urine Microalbumin/Creat Ratio: ordered today - Instruction/counseling given: reminded to bring blood glucose meter & log to each visit and discussed diet - Bayer DCA Hb A1c Waived - Microalbumin / creatinine urine ratio  3. Hair loss - Likely stress related; r/o thyroid.  - TSH  4. Chronic diastolic CHF (congestive heart failure) (HCC) - EF 55-60% during a time of  fluid overload due to IV fluids in the hospital.   5. 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 Prescription assistance form completed for Creon and sent to [facility] of [employer].   6. Pulmonary emphysema, unspecified emphysema type (HCC) - Noted on CT scan. Patient denies any shortness of breath, wheezing, and coughing.    Return in about 4 weeks (around 11/30/2018) for HTN.  Deliah Boston, MSN, APRN, FNP-C [facility]  Subjective:    Patient ID: Heather Oconnor, female    DOB: April 21, 1940, 78 y.o.   MRN: [id number]  Patient Care Team: Gwenlyn Fudge, FNP as PCP - General (Family Medicine) Jena Gauss Gerrit Friends, MD as Consulting Physician (Gastroenterology) Bond, Doran Stabler, MD as Consulting Physician (Ophthalmology)   Chief Complaint:  Chief Complaint  Patient presents with  . Medical Management of Chronic Issues    HPI: Heather Oconnor is a 78 y.o. female presenting on 11/02/2018 for Medical Management of Chronic Issues  Patient has not had Creon for a month as she could not afford the ~$300 a month prescription.   She reports she started checking her blood sugar and blood pressure routinely about a week and a half ago and her blood sugar has been running 180-200s while her blood pressure has also been elevated. Last reading at home 180/92.   She states she just hasn't been feeling like herself since she was in the hospital for pancreatitis. She states her hair is also falling out.    Social  history:  Relevant past medical, surgical, family and social history reviewed and updated as indicated. Interim medical history since our last visit reviewed.  Allergies and medications reviewed and updated.  DATA REVIEWED: CHART IN EPIC  ROS: Negative unless specifically indicated above in HPI.    Current Outpatient Medications:  .  acetaminophen (TYLENOL) 500 MG tablet, Take 2 tablets (1,000 mg total) by mouth every 6 (six)  hours as needed., Disp: 12 tablet, Rfl: 0 .  amLODipine (NORVASC) 10 MG tablet, Take 1 tablet (10 mg total) by mouth daily., Disp: 90 tablet, Rfl: 1 .  aspirin EC 81 MG tablet, Take 1 tablet (81 mg total) by mouth daily with breakfast., Disp: 30 tablet, Rfl: 2 .  clopidogrel (PLAVIX) 75 MG tablet, Take 1 tablet (75 mg total) by mouth daily., Disp: 90 tablet, Rfl: 1 .  DORZOLAMIDE HCL-TIMOLOL MAL OP, Place 1 drop into the right eye daily. , Disp: , Rfl:  .  furosemide (LASIX) 40 MG tablet, Take 1 tablet (40 mg total) by mouth daily., Disp: 90 tablet, Rfl: 1 .  glipiZIDE (GLUCOTROL XL) 5 MG 24 hr tablet, Take 1 tablet (5 mg total) by mouth daily., Disp: 90 tablet, Rfl: 1 .  hydrALAZINE (APRESOLINE) 50 MG tablet, Take 1 tablet (50 mg total) by mouth 3 (three) times daily., Disp: 270 tablet, Rfl: 1 .  isosorbide mononitrate (IMDUR) 30 MG 24 hr tablet, Take 1 tablet (30 mg total) by mouth daily., Disp: 90 tablet, Rfl: 1 .  metFORMIN (GLUCOPHAGE) 500 MG tablet, Take 1 tablet (500 mg total) by mouth 2 (two) times daily with a meal., Disp: 180 tablet, Rfl: 1 .  pantoprazole (PROTONIX) 40 MG tablet, Take 1 tablet (40 mg total) by mouth daily., Disp: 90 tablet, Rfl: 1 .  Pancrelipase, Lip-Prot-Amyl, (CREON) 24000-76000 units CPEP, Take 2 capsules (48,000 Units total) by mouth 3 (three) times daily with meals AND 1 capsule (24,000 Units total) with snacks. (Patient not taking: Reported on 11/02/2018), Disp: 240 capsule, Rfl: 3   Allergies  Allergen Reactions  . Hctz [Hydrochlorothiazide]     Per GI may have caused pancreatitis. Dr. Ardis Hughs recommended never resuming.   Past Medical History:  Diagnosis Date  . Blind right eye   . CHF (congestive heart failure) ([place])   . Cholecystitis   . Cirrhosis ([place])   . COPD (chronic obstructive pulmonary disease) ([place]) 07/04/2018  . Diabetes ([place])   . GERD (gastroesophageal reflux disease)   . Hemorrhoids   . Hypertension   . IBS (irritable bowel syndrome)   .  Pancreatitis   . Transient cerebral ischemia 05/08/2008   Qualifier: Diagnosis of  By: Zeb Comfort    . Weakness 07/2018    Past Surgical History:  Procedure Laterality Date  . COLONOSCOPY  09/07/2007   RMR: anal canal/external hemorrhoids, redundant colon, left-sided diverticula, otherwise normal colonic mucosa    Social History   Socioeconomic History  . Marital status: Married    Spouse name: Not on file  . Number of children: Not on file  . Years of education: Not on file  . Highest education level: Not on file  Occupational History  . Not on file  Social Needs  . Financial resource strain: Not on file  . Food insecurity    Worry: Not on file    Inability: Not on file  . Transportation needs    Medical: Not on file    Non-medical: Not on file  Tobacco Use  . Smoking status:  Former Smoker    Years: 9.00    Quit date: 11/27/2006    Years since quitting: 11.9  . Smokeless tobacco: Never Used  Substance and Sexual Activity  . Alcohol use: No    Alcohol/week: 0.0 standard drinks  . Drug use: No  . Sexual activity: Not Currently  Lifestyle  . Physical activity    Days per week: Not on file    Minutes per session: Not on file  . Stress: Not on file  Relationships  . Social Musicianconnections    Talks on phone: Not on file    Gets together: Not on file    Attends religious service: Not on file    Active member of club or organization: Not on file    Attends meetings of clubs or organizations: Not on file    Relationship status: Not on file  . Intimate partner violence    Fear of current or ex partner: Not on file    Emotionally abused: Not on file    Physically abused: Not on file    Forced sexual activity: Not on file  Other Topics Concern  . Not on file  Social History Narrative  . Not on file        Objective:    BP (!) 187/85   Pulse (!) 106   Temp 99.2 F (37.3 C) (Temporal)   Ht 5\' 6"  (1.676 m)   Wt 158 lb (71.7 kg)   SpO2 96%   BMI 25.50 kg/m    Physical Exam Vitals signs reviewed.  Constitutional:      General: She is not in acute distress.    Appearance: Normal appearance. She is normal weight. She is not ill-appearing, toxic-appearing or diaphoretic.  HENT:     Head: Normocephalic and atraumatic.  Eyes:     General: No scleral icterus.       Right eye: No discharge.        Left eye: No discharge.     Conjunctiva/sclera: Conjunctivae normal.  Neck:     Musculoskeletal: Normal range of motion.  Cardiovascular:     Rate and Rhythm: Normal rate and regular rhythm.     Heart sounds: Normal heart sounds. No murmur. No friction rub. No gallop.   Pulmonary:     Effort: Pulmonary effort is normal. No respiratory distress.     Breath sounds: Normal breath sounds. No stridor. No wheezing, rhonchi or rales.  Musculoskeletal: Normal range of motion.  Skin:    General: Skin is warm and dry.     Capillary Refill: Capillary refill takes less than 2 seconds.  Neurological:     General: No focal deficit present.     Mental Status: She is alert and oriented to person, place, and time. Mental status is at baseline.  Psychiatric:        Mood and Affect: Mood normal.        Behavior: Behavior normal.        Thought Content: Thought content normal.        Judgment: Judgment normal.     Lab Results  Component Value Date   TSH 1.820 11/02/2018   Lab Results  Component Value Date   WBC 10.0 07/12/2018   HGB 9.5 (L) 07/12/2018   HCT 30.5 (L) 07/12/2018   MCV 88.7 07/12/2018   PLT 180 07/12/2018   Lab Results  Component Value Date   NA 138 08/02/2018   K 3.5 08/02/2018   CO2 25 08/02/2018  GLUCOSE 137 (H) 08/02/2018   BUN 8 08/02/2018   CREATININE 0.82 08/02/2018   BILITOT 0.3 08/02/2018   ALKPHOS 50 08/02/2018   AST 12 08/02/2018   ALT 7 08/02/2018   PROT 6.2 08/02/2018   ALBUMIN 3.5 (L) 08/02/2018   CALCIUM 8.4 (L) 08/02/2018   ANIONGAP 9 07/14/2018   Lab Results  Component Value Date   CHOL 183 06/12/2018    Lab Results  Component Value Date   HDL 40 (L) 06/12/2018   Lab Results  Component Value Date   LDLCALC 116 (H) 06/12/2018   Lab Results  Component Value Date   TRIG 133 06/12/2018   Lab Results  Component Value Date   CHOLHDL 4.6 06/12/2018   Lab Results  Component Value Date   HGBA1C 7.1 (H) 11/02/2018

## 2018-11-03 LAB — TSH: TSH: 1.82 u[IU]/mL (ref 0.450–4.500)

## 2018-11-06 ENCOUNTER — Encounter: Payer: Self-pay | Admitting: Family Medicine

## 2018-11-06 MED ORDER — HYDRALAZINE HCL 50 MG PO TABS: 50.0000 mg | ORAL_TABLET | Freq: Three times a day (TID) | ORAL | 1 refills | Status: DC

## 2018-11-06 MED ORDER — METFORMIN HCL 500 MG PO TABS: 500.0000 mg | ORAL_TABLET | Freq: Two times a day (BID) | ORAL | 1 refills | Status: DC

## 2018-11-06 MED ORDER — FUROSEMIDE 40 MG PO TABS: 40.0000 mg | ORAL_TABLET | Freq: Every day | ORAL | 1 refills | Status: DC | PRN

## 2018-11-06 MED ORDER — PANTOPRAZOLE SODIUM 40 MG PO TBEC: 40.0000 mg | DELAYED_RELEASE_TABLET | Freq: Every day | ORAL | 2 refills | Status: DC

## 2018-11-06 MED ORDER — GLIPIZIDE ER 5 MG PO TB24: 5.0000 mg | ORAL_TABLET | Freq: Every day | ORAL | 1 refills | Status: DC

## 2018-11-13 ENCOUNTER — Ambulatory Visit (INDEPENDENT_AMBULATORY_CARE_PROVIDER_SITE_OTHER): Payer: Medicare HMO | Admitting: Family Medicine

## 2018-11-13 ENCOUNTER — Other Ambulatory Visit: Payer: Self-pay

## 2018-11-13 ENCOUNTER — Telehealth: Payer: Self-pay | Admitting: Family Medicine

## 2018-11-13 ENCOUNTER — Encounter: Payer: Self-pay | Admitting: Family Medicine

## 2018-11-13 DIAGNOSIS — I10 Essential (primary) hypertension: Secondary | ICD-10-CM | POA: Diagnosis not present

## 2018-11-13 MED ORDER — CLONIDINE HCL 0.1 MG PO TABS: 0.1000 mg | ORAL_TABLET | Freq: Two times a day (BID) | ORAL | 5 refills | Status: DC

## 2018-11-13 NOTE — Telephone Encounter (Signed)
televisit made today patient to dizzy to drive here.

## 2018-11-13 NOTE — Progress Notes (Signed)
Subjective:  Patient ID: Heather Oconnor, female    DOB: Oct 11, 1940  Age: 78 y.o. MRN: [id number]  CC: No chief complaint on file.   HPI Heather Oconnor presents for 190/110 to 200/118. Gives multiple similar readings. Head feels a little heavy. Denies being dizzy. Afraid she might get dizzy. No cchest pain or focal weakness, numbness. Has not3ed elevated BP on several occasions recently.  Depression screen [facility] 2/9 11/02/2018 08/02/2018  Decreased Interest 0 0  Down, Depressed, Hopeless 0 0  PHQ - 2 Score 0 0  Altered sleeping 0 0  Tired, decreased energy 0 0  Change in appetite 0 0  Feeling bad or failure about yourself  0 0  Trouble concentrating 0 0  Moving slowly or fidgety/restless 0 0  Suicidal thoughts 0 -  PHQ-9 Score 0 0    History Heather Oconnor has a past medical history of Blind right eye, CHF (congestive heart failure) (HCC), Cholecystitis, Cirrhosis (HCC), COPD (chronic obstructive pulmonary disease) (HCC) (07/04/2018), Diabetes (HCC), GERD (gastroesophageal reflux disease), Hemorrhoids, Hypertension, IBS (irritable bowel syndrome), Pancreatitis, Transient cerebral ischemia (05/08/2008), and Weakness (07/2018).   She has a past surgical history that includes Colonoscopy (09/07/2007).   Her family history includes Diabetes in her son; Hypertension in her brother, brother, father, mother, sister, and son.She reports that she quit smoking about 11 years ago. She quit after 9.00 years of use. She has never used smokeless tobacco. She reports that she does not drink alcohol or use drugs.    ROS Review of Systems  Constitutional: Negative.   HENT: Negative.   Eyes: Negative for visual disturbance.  Respiratory: Negative for shortness of breath.   Cardiovascular: Negative for chest pain.  Gastrointestinal: Negative for abdominal pain.  Neurological: Positive for headaches. Negative for dizziness, weakness and numbness.    Objective:  There were no vitals taken for this  visit.  BP Readings from Last 3 Encounters:  11/02/18 (!) 187/85  09/05/18 (!) 186/92  08/02/18 138/80    Wt Readings from Last 3 Encounters:  11/02/18 158 lb (71.7 kg)  09/05/18 151 lb 3.2 oz (68.6 kg)  08/02/18 152 lb (68.9 kg)     Physical Exam  Exam deferred. Pt. Harboring due to COVID 19. Phone visit performed.   Assessment & Plan:   Diagnoses and all orders for this visit:  Accelerated hypertension  Other orders -     cloNIDine (CATAPRES) 0.1 MG tablet; Take 1 tablet (0.1 mg total) by mouth 2 (two) times daily. For Blood Pressure       I am having Heather Oconnor start on cloNIDine. I am also having her maintain her DORZOLAMIDE HCL-TIMOLOL MAL OP, acetaminophen, aspirin EC, amLODipine, clopidogrel, isosorbide mononitrate, Creon, pantoprazole, metFORMIN, hydrALAZINE, glipiZIDE, and furosemide.  Allergies as of 11/13/2018      Reactions   Hctz [hydrochlorothiazide]    Per GI may have caused pancreatitis. Dr. Christella Hartigan recommended never resuming.      Medication List       Accurate as of November 13, 2018 11:08 AM. If you have any questions, ask your nurse or doctor.        acetaminophen 500 MG tablet Commonly known as: TYLENOL Take 2 tablets (1,000 mg total) by mouth every 6 (six) hours as needed.   amLODipine 10 MG tablet Commonly known as: NORVASC Take 1 tablet (10 mg total) by mouth daily.   aspirin EC 81 MG tablet Take 1 tablet (81 mg total) by mouth daily with breakfast.  cloNIDine 0.1 MG tablet Commonly known as: CATAPRES Take 1 tablet (0.1 mg total) by mouth 2 (two) times daily. For Blood Pressure Started by: Claretta Fraise, MD   clopidogrel 75 MG tablet Commonly known as: PLAVIX Take 1 tablet (75 mg total) by mouth daily.   Creon 24000-76000 units Cpep Generic drug: Pancrelipase (Lip-Prot-Amyl) Take 2 capsules (48,000 Units total) by mouth 3 (three) times daily with meals AND 1 capsule (24,000 Units total) with snacks.   DORZOLAMIDE  HCL-TIMOLOL MAL OP Place 1 drop into the right eye daily.   furosemide 40 MG tablet Commonly known as: LASIX Take 1 tablet (40 mg total) by mouth daily as needed for edema.   glipiZIDE 5 MG 24 hr tablet Commonly known as: GLUCOTROL XL Take 1 tablet (5 mg total) by mouth daily.   hydrALAZINE 50 MG tablet Commonly known as: APRESOLINE Take 1 tablet (50 mg total) by mouth 3 (three) times daily.   isosorbide mononitrate 30 MG 24 hr tablet Commonly known as: IMDUR Take 1 tablet (30 mg total) by mouth daily.   metFORMIN 500 MG tablet Commonly known as: GLUCOPHAGE Take 1 tablet (500 mg total) by mouth 2 (two) times daily with a meal.   pantoprazole 40 MG tablet Commonly known as: PROTONIX Take 1 tablet (40 mg total) by mouth daily.      Virtual Visit via telephone Note  I discussed the limitations, risks, security and privacy concerns of performing an evaluation and management service by telephone and the availability of in person appointments. I also discussed with the patient that there may be a patient responsible charge related to this service. The patient expressed understanding and agreed to proceed. Pt. Is at home. Dr. Livia Snellen is in his office.  Follow Up Instructions:   I discussed the assessment and treatment plan with the patient. The patient was provided an opportunity to ask questions and all were answered. The patient agreed with the plan and demonstrated an understanding of the instructions.   The patient was advised to call back or seek an in-person evaluation if the symptoms worsen or if the condition fails to improve as anticipated.  Total minutes including chart review and phone contact time: 14   Follow-up: Return in about 1 week (around 11/20/2018) for hypertension with Ms. Blanch Media.Claretta Fraise, M.D.

## 2018-12-04 NOTE — Progress Notes (Signed)
Virtual Visit via Telephone Note  I connected with Heather Oconnor on 12/07/18 at 7:53 AM by telephone and verified that I am speaking with the correct person using two identifiers. Heather Oconnor is currently located at home and nobody is currently with her during this visit. The provider, Gwenlyn Fudge, FNP is located in their office at time of visit.  I discussed the limitations, risks, security and privacy concerns of performing an evaluation and management service by telephone and the availability of in person appointments. I also discussed with the patient that there may be a patient responsible charge related to this service. The patient expressed understanding and agreed to proceed.  Subjective: PCP: Gwenlyn Fudge, FNP  Chief Complaint  Patient presents with  . Hypertension   When patient was last seen by me she was going to increase hydralazine and take it 3 times daily as prescribed.  She was stopping Imdur due to lack of any chest pain whatsoever.  Furosemide was changed to as needed for edema which she had not been experiencing.  Lisinopril and HCTZ are not options for her due to pancreatitis.  She and I discussed that if her heart rate is consistently greater than 100 we would add metoprolol back otherwise if her blood pressure remained elevated we would add losartan.  She has since had a telephone visit with another provider in the office on 11/13/2018 at which time clonidine 0.1 mg twice daily was added.  She reports today average BP is 170s/80s.  Heart rate is staying in the 70s.   ROS: Per HPI  Current Outpatient Medications:  .  acetaminophen (TYLENOL) 500 MG tablet, Take 2 tablets (1,000 mg total) by mouth every 6 (six) hours as needed., Disp: 12 tablet, Rfl: 0 .  amLODipine (NORVASC) 10 MG tablet, Take 1 tablet (10 mg total) by mouth daily., Disp: 90 tablet, Rfl: 1 .  aspirin EC 81 MG tablet, Take 1 tablet (81 mg total) by mouth daily with breakfast., Disp: 30  tablet, Rfl: 2 .  cloNIDine (CATAPRES) 0.1 MG tablet, Take 1 tablet (0.1 mg total) by mouth 2 (two) times daily. For Blood Pressure, Disp: 60 tablet, Rfl: 5 .  clopidogrel (PLAVIX) 75 MG tablet, Take 1 tablet (75 mg total) by mouth daily., Disp: 90 tablet, Rfl: 1 .  DORZOLAMIDE HCL-TIMOLOL MAL OP, Place 1 drop into the right eye daily. , Disp: , Rfl:  .  furosemide (LASIX) 40 MG tablet, Take 1 tablet (40 mg total) by mouth daily as needed for edema., Disp: 90 tablet, Rfl: 1 .  glipiZIDE (GLUCOTROL XL) 5 MG 24 hr tablet, Take 1 tablet (5 mg total) by mouth daily., Disp: 90 tablet, Rfl: 1 .  hydrALAZINE (APRESOLINE) 50 MG tablet, Take 1 tablet (50 mg total) by mouth 3 (three) times daily., Disp: 270 tablet, Rfl: 1 .  isosorbide mononitrate (IMDUR) 30 MG 24 hr tablet, Take 1 tablet (30 mg total) by mouth daily., Disp: 90 tablet, Rfl: 1 .  metFORMIN (GLUCOPHAGE) 500 MG tablet, Take 1 tablet (500 mg total) by mouth 2 (two) times daily with a meal., Disp: 180 tablet, Rfl: 1 .  Pancrelipase, Lip-Prot-Amyl, (CREON) 24000-76000 units CPEP, Take 2 capsules (48,000 Units total) by mouth 3 (three) times daily with meals AND 1 capsule (24,000 Units total) with snacks. (Patient not taking: Reported on 11/02/2018), Disp: 240 capsule, Rfl: 3 .  pantoprazole (PROTONIX) 40 MG tablet, Take 1 tablet (40 mg total) by mouth daily., Disp:  90 tablet, Rfl: 2  Allergies  Allergen Reactions  . Hctz [Hydrochlorothiazide]     Per GI may have caused pancreatitis. Dr. Ardis Hughs recommended never resuming.   Past Medical History:  Diagnosis Date  . Blind right eye   . CHF (congestive heart failure) ([place])   . Cholecystitis   . Cirrhosis ([place])   . COPD (chronic obstructive pulmonary disease) ([place]) 07/04/2018  . Diabetes ([place])   . GERD (gastroesophageal reflux disease)   . Hemorrhoids   . Hypertension   . IBS (irritable bowel syndrome)   . Pancreatitis   . Transient cerebral ischemia 05/08/2008   Qualifier: Diagnosis of  By:  Zeb Comfort    . Weakness 07/2018    Observations/Objective: A&O  No respiratory distress or wheezing audible over the phone Mood, judgement, and thought processes all WNL  Assessment and Plan: 1. Essential hypertension - Uncontrolled. Continue hydralazine 50 mg 3 times daily, clonidine 0.1 mg twice daily, and amlodipine 10 mg once daily.  Begin Losartan 50 mg once daily today. She has not had to use the furosemide at all. - losartan (COZAAR) 50 MG tablet; Take 1 tablet (50 mg total) by mouth daily.  Dispense: 30 tablet; Refill: 2   Follow Up Instructions:  Return in about 4 weeks (around 01/04/2019) for HTN.  I discussed the assessment and treatment plan with the patient. The patient was provided an opportunity to ask questions and all were answered. The patient agreed with the plan and demonstrated an understanding of the instructions.   The patient was advised to call back or seek an in-person evaluation if the symptoms worsen or if the condition fails to improve as anticipated.  The above assessment and management plan was discussed with the patient. The patient verbalized understanding of and has agreed to the management plan. Patient is aware to call the clinic if symptoms persist or worsen. Patient is aware when to return to the clinic for a follow-up visit. Patient educated on when it is appropriate to go to the emergency department.   Time call ended: 8:02 AM  I provided 12 minutes of non-face-to-face time during this encounter.  Hendricks Limes, MSN, APRN, FNP-C [facility] 12/07/18

## 2018-12-07 ENCOUNTER — Encounter: Payer: Self-pay | Admitting: Family Medicine

## 2018-12-07 ENCOUNTER — Ambulatory Visit (INDEPENDENT_AMBULATORY_CARE_PROVIDER_SITE_OTHER): Payer: Medicare HMO | Admitting: Family Medicine

## 2018-12-07 DIAGNOSIS — I10 Essential (primary) hypertension: Secondary | ICD-10-CM | POA: Diagnosis not present

## 2018-12-07 MED ORDER — LOSARTAN POTASSIUM 50 MG PO TABS: 50.0000 mg | ORAL_TABLET | Freq: Every day | ORAL | 2 refills | Status: DC

## 2018-12-08 ENCOUNTER — Ambulatory Visit (INDEPENDENT_AMBULATORY_CARE_PROVIDER_SITE_OTHER): Payer: Medicare HMO | Admitting: Family Medicine

## 2018-12-08 ENCOUNTER — Encounter: Payer: Self-pay | Admitting: Family Medicine

## 2018-12-08 DIAGNOSIS — E119 Type 2 diabetes mellitus without complications: Secondary | ICD-10-CM | POA: Diagnosis not present

## 2018-12-08 MED ORDER — METFORMIN HCL 500 MG PO TABS: ORAL_TABLET | ORAL | 0 refills | Status: DC

## 2018-12-08 NOTE — Progress Notes (Signed)
Virtual Visit via Telephone Note  I connected with Heather Oconnor on 12/08/18 at 10:03 AM by telephone and verified that I am speaking with the correct person using two identifiers. Heather Oconnor is currently located at home and nobody is currently with her during this visit. The provider, Gwenlyn Fudge, FNP is located in their home at time of visit.  I discussed the limitations, risks, security and privacy concerns of performing an evaluation and management service by telephone and the availability of in person appointments. I also discussed with the patient that there may be a patient responsible charge related to this service. The patient expressed understanding and agreed to proceed.  Subjective: PCP: Gwenlyn Fudge, FNP  Chief Complaint  Patient presents with  . Blood Sugar Problem   Patient called to report that she forgot to tell me yesterday that her blood sugars have been running high.  She does check a fasting blood glucose every morning and reports that recently she has been running in the 180s - 190s.  This morning her fasting blood sugar was 199.  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  She would like to increase her medication to decrease her fasting blood sugars.  Her last A1c was 7.1.   ROS: Per HPI  Current Outpatient Medications:  .  acetaminophen (TYLENOL) 500 MG tablet, Take 2 tablets (1,000 mg total) by mouth every 6 (six) hours as needed., Disp: 12 tablet, Rfl: 0 .  amLODipine (NORVASC) 10 MG tablet, Take 1 tablet (10 mg total) by mouth daily., Disp: 90 tablet, Rfl: 1 .  aspirin EC 81 MG tablet, Take 1 tablet (81 mg total) by mouth daily with breakfast., Disp: 30 tablet, Rfl: 2 .  cloNIDine (CATAPRES) 0.1 MG tablet, Take 1 tablet (0.1 mg total) by mouth 2 (two) times daily. For Blood Pressure, Disp: 60 tablet, Rfl: 5 .  clopidogrel (PLAVIX) 75 MG tablet, Take 1 tablet (75 mg  total) by mouth daily., Disp: 90 tablet, Rfl: 1 .  DORZOLAMIDE HCL-TIMOLOL MAL OP, Place 1 drop into the right eye daily. , Disp: , Rfl:  .  furosemide (LASIX) 40 MG tablet, Take 1 tablet (40 mg total) by mouth daily as needed for edema., Disp: 90 tablet, Rfl: 1 .  glipiZIDE (GLUCOTROL XL) 5 MG 24 hr tablet, Take 1 tablet (5 mg total) by mouth daily., Disp: 90 tablet, Rfl: 1 .  hydrALAZINE (APRESOLINE) 50 MG tablet, Take 1 tablet (50 mg total) by mouth 3 (three) times daily., Disp: 270 tablet, Rfl: 1 .  latanoprost (XALATAN) 0.005 % ophthalmic solution, Place 1 drop into the right eye nightly., Disp: , Rfl:  .  losartan (COZAAR) 50 MG tablet, Take 1 tablet (50 mg total) by mouth daily., Disp: 30 tablet, Rfl: 2 .  metFORMIN (GLUCOPHAGE) 500 MG tablet, Take 1 tablet (500 mg total) by mouth 2 (two) times daily with a meal., Disp: 180 tablet, Rfl: 1 .  Pancrelipase, Lip-Prot-Amyl, (CREON) 24000-76000 units CPEP, Take 2 capsules (48,000 Units total) by mouth 3 (three) times daily with meals AND 1 capsule (24,000 Units total) with snacks. (Patient not taking: Reported on 11/02/2018), Disp: 240 capsule, Rfl: 3 .  pantoprazole (PROTONIX) 40 MG tablet, Take 1 tablet (40 mg total) by mouth daily., Disp: 90 tablet, Rfl: 2  Allergies  Allergen Reactions  . Hctz [Hydrochlorothiazide]     Per GI may  have caused pancreatitis. Dr. Ardis Hughs recommended never resuming.  . Lisinopril     H/O pancreatitis.   Past Medical History:  Diagnosis Date  . Blind right eye   . CHF (congestive heart failure) ([place])   . Cholecystitis   . Cirrhosis ([place])   . COPD (chronic obstructive pulmonary disease) ([place]) 07/04/2018  . Diabetes ([place])   . GERD (gastroesophageal reflux disease)   . Hemorrhoids   . Hypertension   . IBS (irritable bowel syndrome)   . Pancreatitis   . Transient cerebral ischemia 05/08/2008   Qualifier: Diagnosis of  By: Zeb Comfort    . Weakness 07/2018    Observations/Objective: A&O  No  respiratory distress or wheezing audible over the phone Mood, judgement, and thought processes all WNL  Assessment and Plan: 1. Type 2 diabetes mellitus without complication, without long-term current use of insulin (HCC) - Metformin increased from 500 mg twice daily to 500 mg with breakfast and 1000 mg with supper.  A1c a month ago was 7.1 so this is not an inappropriate request by the patient.  She will continue to monitor fasting blood sugar daily. - metFORMIN (GLUCOPHAGE) 500 MG tablet; Take 1 tablet (500 mg total) by mouth daily with breakfast AND 2 tablets (1,000 mg total) daily with supper.  Dispense: 270 tablet; Refill: 0   Follow Up Instructions:  I discussed the assessment and treatment plan with the patient. The patient was provided an opportunity to ask questions and all were answered. The patient agreed with the plan and demonstrated an understanding of the instructions.   The patient was advised to call back or seek an in-person evaluation if the symptoms worsen or if the condition fails to improve as anticipated.  The above assessment and management plan was discussed with the patient. The patient verbalized understanding of and has agreed to the management plan. Patient is aware to call the clinic if symptoms persist or worsen. Patient is aware when to return to the clinic for a follow-up visit. Patient educated on when it is appropriate to go to the emergency department.   Time call ended: 10:13 AM  I provided 12 minutes of non-face-to-face time during this encounter.  Hendricks Limes, MSN, APRN, FNP-C [facility] 12/08/18

## 2018-12-12 ENCOUNTER — Ambulatory Visit: Payer: Medicare HMO | Admitting: Internal Medicine

## 2018-12-12 ENCOUNTER — Encounter: Payer: Self-pay | Admitting: Internal Medicine

## 2018-12-12 ENCOUNTER — Other Ambulatory Visit: Payer: Self-pay

## 2018-12-12 VITALS — BP 214/88 | HR 76 | Temp 97.6°F | Ht 64.0 in | Wt 164.0 lb

## 2018-12-12 DIAGNOSIS — K863 Pseudocyst of pancreas: Secondary | ICD-10-CM

## 2018-12-12 DIAGNOSIS — K859 Acute pancreatitis without necrosis or infection, unspecified: Secondary | ICD-10-CM

## 2018-12-12 NOTE — Progress Notes (Signed)
Primary Care Physician:  Gwenlyn FudgeJoyce, Britney F, FNP Primary Gastroenterologist:  Dr. Jena Gaussourk  Pre-Procedure History & Physical: HPI:  Heather Oconnor is a 78 y.o. female here for follow-up of complicated pancreatitis earlier in the year.  Serial CT showed multiple pseudocyst somewhat improved as of the last scan in 1 July.  No pancreatic mass seen on scan. Gallbladder in situ.  No stones or biliary dilation seen on prior CT/ultrasound.  Seen in consultation by general surgery.  Gallbladder removal contemplated but not done..  No alcohol or hyperlipidemia in the history.  Medications implicated.  She has been on Protonix and pancreatic enzymes since her hospitalization. BP has been difficult to control.  Significantly elevated today. From a GI standpoint, doing well. Gaining weight, taking pancreatic enzymes and a PPI. Past Medical History:  Diagnosis Date  . Blind right eye   . CHF (congestive heart failure) (HCC)   . Cholecystitis   . Cirrhosis (HCC)   . COPD (chronic obstructive pulmonary disease) (HCC) 07/04/2018  . Diabetes (HCC)   . GERD (gastroesophageal reflux disease)   . Hemorrhoids   . Hypertension   . IBS (irritable bowel syndrome)   . Pancreatitis   . Transient cerebral ischemia 05/08/2008   Qualifier: Diagnosis of  By: Diana EvesSherman, Susan    . Weakness 07/2018    Past Surgical History:  Procedure Laterality Date  . COLONOSCOPY  09/07/2007   RMR: anal canal/external hemorrhoids, redundant colon, left-sided diverticula, otherwise normal colonic mucosa    Prior to Admission medications   Medication Sig Start Date End Date Taking? Authorizing Provider  acetaminophen (TYLENOL) 500 MG tablet Take 2 tablets (1,000 mg total) by mouth every 6 (six) hours as needed. 06/18/18  Yes Emokpae, Courage, MD  amLODipine (NORVASC) 10 MG tablet Take 1 tablet (10 mg total) by mouth daily. 08/03/18  Yes Deliah BostonJoyce, Britney F, FNP  aspirin EC 81 MG tablet Take 1 tablet (81 mg total) by mouth daily with  breakfast. 06/18/18 06/18/19 Yes Emokpae, Courage, MD  cloNIDine (CATAPRES) 0.1 MG tablet Take 1 tablet (0.1 mg total) by mouth 2 (two) times daily. For Blood Pressure 11/13/18  Yes Stacks, Broadus JohnWarren, MD  clopidogrel (PLAVIX) 75 MG tablet Take 1 tablet (75 mg total) by mouth daily. 08/03/18  Yes Deliah BostonJoyce, Britney F, FNP  DORZOLAMIDE HCL-TIMOLOL MAL OP Place 1 drop into the right eye daily.    Yes [provider]  furosemide (LASIX) 40 MG tablet Take 1 tablet (40 mg total) by mouth daily as needed for edema. 11/06/18  Yes Deliah BostonJoyce, Britney F, FNP  glipiZIDE (GLUCOTROL XL) 5 MG 24 hr tablet Take 1 tablet (5 mg total) by mouth daily. 11/06/18  Yes Deliah BostonJoyce, Britney F, FNP  hydrALAZINE (APRESOLINE) 50 MG tablet Take 1 tablet (50 mg total) by mouth 3 (three) times daily. 11/06/18  Yes Gwenlyn FudgeJoyce, Britney F, FNP  latanoprost (XALATAN) 0.005 % ophthalmic solution Place 1 drop into the right eye nightly. 10/24/17  Yes [provider]  losartan (COZAAR) 50 MG tablet Take 1 tablet (50 mg total) by mouth daily. 12/07/18  Yes Deliah BostonJoyce, Britney F, FNP  metFORMIN (GLUCOPHAGE) 500 MG tablet Take 1 tablet (500 mg total) by mouth daily with breakfast AND 2 tablets (1,000 mg total) daily with supper. 12/08/18  Yes Deliah BostonJoyce, Britney F, FNP  pantoprazole (PROTONIX) 40 MG tablet Take 1 tablet (40 mg total) by mouth daily. 11/06/18  Yes Deliah BostonJoyce, Britney F, FNP  Pancrelipase, Lip-Prot-Amyl, (CREON) 24000-76000 units CPEP Take 2 capsules (48,000  Units total) by mouth 3 (three) times daily with meals AND 1 capsule (24,000 Units total) with snacks. Patient not taking: Reported on 11/02/2018 08/08/18   Anice Paganini, NP    Allergies as of 12/12/2018 - Review Complete 12/12/2018  Allergen Reaction Noted  . Hctz [hydrochlorothiazide]  07/13/2018  . Lisinopril  12/07/2018    Family History  Problem Relation Age of Onset  . Hypertension Mother   . Hypertension Father   . Hypertension Sister   . Hypertension Brother   . Hypertension Brother    . Diabetes Son   . Hypertension Son   . Colon cancer Neg Hx     Social History   Socioeconomic History  . Marital status: Married    Spouse name: Not on file  . Number of children: Not on file  . Years of education: Not on file  . Highest education level: Not on file  Occupational History  . Not on file  Social Needs  . Financial resource strain: Not on file  . Food insecurity    Worry: Not on file    Inability: Not on file  . Transportation needs    Medical: Not on file    Non-medical: Not on file  Tobacco Use  . Smoking status: Former Smoker    Years: 9.00    Quit date: 11/27/2006    Years since quitting: 12.0  . Smokeless tobacco: Never Used  Substance and Sexual Activity  . Alcohol use: No    Alcohol/week: 0.0 standard drinks  . Drug use: No  . Sexual activity: Not Currently  Lifestyle  . Physical activity    Days per week: Not on file    Minutes per session: Not on file  . Stress: Not on file  Relationships  . Social Musician on phone: Not on file    Gets together: Not on file    Attends religious service: Not on file    Active member of club or organization: Not on file    Attends meetings of clubs or organizations: Not on file    Relationship status: Not on file  . Intimate partner violence    Fear of current or ex partner: Not on file    Emotionally abused: Not on file    Physically abused: Not on file    Forced sexual activity: Not on file  Other Topics Concern  . Not on file  Social History Narrative  . Not on file    Review of Systems: See HPI, otherwise negative ROS  Physical Exam: BP (!) 214/88   Pulse 76   Temp 97.6 F (36.4 C) (Oral)   Ht 5\' 4"  (1.626 m)   Wt 164 lb (74.4 kg)   BMI 28.15 kg/m  General:   Alert,  Well-developed, well-nourished, pleasant and cooperative in NAD Neck:  Supple; no masses or thyromegaly. No significant cervical adenopathy. Lungs:  Clear throughout to auscultation.   No wheezes, crackles,  or rhonchi. No acute distress. Heart:  Regular rate and rhythm; no murmurs, clicks, rubs,  or gallops. Abdomen: Non-distended, normal bowel sounds.  Soft and nontender without appreciable mass or hepatosplenomegaly.  Pulses:  Normal pulses noted. Extremities:  Without clubbing or edema.  Impression/Plan:  78 y/o female with rather severe pancreatitis earlier in the year complicated by pseudocyst.  Microlithiasis certainly a possible cause.  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 BP unaccepitably high today.  Recommendations See [facility] tomorrow about BP  Pancreatic  protocol CT - follow-up on pseudocyst - evaluate for occult neoplasm  No change in medications for now  Further recommendations to follow   Notice: This dictation was prepared with Dragon dictation along with smaller phrase technology. Any transcriptional errors that result from this process are unintentional and may not be corrected upon review.

## 2018-12-12 NOTE — Patient Instructions (Signed)
See [facility] tomorrow about BP  pancreatic protocol CT - follow-up on pseudocyst - evaluate for occult neoplasm  No change in medications for now  Further recommendations to follow

## 2018-12-13 ENCOUNTER — Ambulatory Visit (INDEPENDENT_AMBULATORY_CARE_PROVIDER_SITE_OTHER): Payer: Medicare HMO | Admitting: Family Medicine

## 2018-12-13 ENCOUNTER — Other Ambulatory Visit: Payer: Self-pay

## 2018-12-13 ENCOUNTER — Encounter: Payer: Self-pay | Admitting: Family Medicine

## 2018-12-13 ENCOUNTER — Telehealth: Payer: Self-pay

## 2018-12-13 DIAGNOSIS — K863 Pseudocyst of pancreas: Secondary | ICD-10-CM

## 2018-12-13 DIAGNOSIS — K862 Cyst of pancreas: Secondary | ICD-10-CM

## 2018-12-13 DIAGNOSIS — I10 Essential (primary) hypertension: Secondary | ICD-10-CM | POA: Diagnosis not present

## 2018-12-13 MED ORDER — LOSARTAN POTASSIUM 100 MG PO TABS: 100.0000 mg | ORAL_TABLET | Freq: Every day | ORAL | 2 refills | Status: DC

## 2018-12-13 NOTE — Patient Instructions (Signed)

## 2018-12-13 NOTE — Telephone Encounter (Signed)
CT approved. [employer] number: [id number], valid 12/25/18-01/24/19.

## 2018-12-13 NOTE — Progress Notes (Signed)
Assessment & Plan:  1. Essential hypertension - Uncontrolled.  Losartan increased from 50 mg to 100 mg once daily.  Patient to continue hydralazine 50 mg 3 times daily, clonidine 0.1 mg twice daily, and amlodipine 10 mg once daily.  Education provided on the DASH diet. - CMP14+EGFR - losartan (COZAAR) 100 MG tablet; Take 1 tablet (100 mg total) by mouth daily.  Dispense: 30 tablet; Refill: 2 - Ambulatory referral to Cardiology for help with blood pressure control.    Follow up plan: Return as scheduled.  Hendricks Limes, MSN, APRN, FNP-C [facility]  Subjective:   Patient ID: AERICA Oconnor, female    DOB: Nov 25, 1940, 78 y.o.   MRN: [id number]  HPI: Heather Oconnor is a 78 y.o. female presenting on 12/13/2018 for Hypertension  Patient is here at the request of her gastroenterologist due to a BP in the office yesterday of 214/88. Patient has had a lot of changes with her blood pressure medications over the past few months.  Lisinopril and hydrochlorothiazide were discontinued due to pancreatitis.  Also while she was in the hospital her metoprolol was discontinued.  Furosemide was changed to as needed for edema.  Indoor was stopped due to lack of any chest pain whatsoever.  Hydralazine was increased to 3 times daily.  Then a month ago she was started on clonidine 0.1 mg twice daily.  A week ago she was started on losartan 50 mg once daily.  Throughout this she has continued amlodipine 10 mg once daily.   ROS: Negative unless specifically indicated above in HPI.   Relevant past medical history reviewed and updated as indicated.   Allergies and medications reviewed and updated.   Current Outpatient Medications:  .  acetaminophen (TYLENOL) 500 MG tablet, Take 2 tablets (1,000 mg total) by mouth every 6 (six) hours as needed., Disp: 12 tablet, Rfl: 0 .  amLODipine (NORVASC) 10 MG tablet, Take 1 tablet (10 mg total) by mouth daily., Disp: 90 tablet, Rfl: 1 .   aspirin EC 81 MG tablet, Take 1 tablet (81 mg total) by mouth daily with breakfast., Disp: 30 tablet, Rfl: 2 .  cloNIDine (CATAPRES) 0.1 MG tablet, Take 1 tablet (0.1 mg total) by mouth 2 (two) times daily. For Blood Pressure, Disp: 60 tablet, Rfl: 5 .  clopidogrel (PLAVIX) 75 MG tablet, Take 1 tablet (75 mg total) by mouth daily., Disp: 90 tablet, Rfl: 1 .  DORZOLAMIDE HCL-TIMOLOL MAL OP, Place 1 drop into the right eye daily. , Disp: , Rfl:  .  furosemide (LASIX) 40 MG tablet, Take 1 tablet (40 mg total) by mouth daily as needed for edema., Disp: 90 tablet, Rfl: 1 .  glipiZIDE (GLUCOTROL XL) 5 MG 24 hr tablet, Take 1 tablet (5 mg total) by mouth daily., Disp: 90 tablet, Rfl: 1 .  hydrALAZINE (APRESOLINE) 50 MG tablet, Take 1 tablet (50 mg total) by mouth 3 (three) times daily., Disp: 270 tablet, Rfl: 1 .  latanoprost (XALATAN) 0.005 % ophthalmic solution, Place 1 drop into the right eye nightly., Disp: , Rfl:  .  losartan (COZAAR) 100 MG tablet, Take 1 tablet (100 mg total) by mouth daily., Disp: 30 tablet, Rfl: 2 .  metFORMIN (GLUCOPHAGE) 500 MG tablet, Take 1 tablet (500 mg total) by mouth daily with breakfast AND 2 tablets (1,000 mg total) daily with supper., Disp: 270 tablet, Rfl: 0 .  Pancrelipase, Lip-Prot-Amyl, (CREON) 24000-76000 units CPEP, Take 2 capsules (48,000 Units total) by mouth 3 (three)  times daily with meals AND 1 capsule (24,000 Units total) with snacks., Disp: 240 capsule, Rfl: 3 .  pantoprazole (PROTONIX) 40 MG tablet, Take 1 tablet (40 mg total) by mouth daily., Disp: 90 tablet, Rfl: 2  Allergies  Allergen Reactions  . Hctz [Hydrochlorothiazide]     Per GI may have caused pancreatitis. Dr. Ardis Hughs recommended never resuming.  . Lisinopril     H/O pancreatitis.    Objective:   BP (!) 198/92 Comment: manual  Pulse 92   Temp 98.9 F (37.2 C) (Temporal)   Ht '5\' 4"'  (1.626 m)   Wt 165 lb (74.8 kg)   SpO2 97%   BMI 28.32 kg/m    Physical Exam Vitals signs reviewed.   Constitutional:      General: She is not in acute distress.    Appearance: Normal appearance. She is not ill-appearing, toxic-appearing or diaphoretic.  HENT:     Head: Normocephalic and atraumatic.  Eyes:     General: No scleral icterus.       Right eye: No discharge.        Left eye: No discharge.     Conjunctiva/sclera: Conjunctivae normal.  Neck:     Musculoskeletal: Normal range of motion.  Cardiovascular:     Rate and Rhythm: Normal rate and regular rhythm.     Heart sounds: Murmur present. Systolic murmur present with a grade of 2/6. No friction rub. No gallop.   Pulmonary:     Effort: Pulmonary effort is normal. No respiratory distress.     Breath sounds: Normal breath sounds. No stridor. No wheezing, rhonchi or rales.  Musculoskeletal: Normal range of motion.  Skin:    General: Skin is warm and dry.     Capillary Refill: Capillary refill takes less than 2 seconds.  Neurological:     General: No focal deficit present.     Mental Status: She is alert and oriented to person, place, and time. Mental status is at baseline.  Psychiatric:        Mood and Affect: Mood normal.        Behavior: Behavior normal.        Thought Content: Thought content normal.        Judgment: Judgment normal.

## 2018-12-13 NOTE — Telephone Encounter (Signed)
CT abdomen w/wo contrast (pancreatic protocol) scheduled for 12/25/18 at 12:00pm, arrive at 11:45am. NPO 4 hours prior to appt. Needs updated creatinine.  Called and informed pt of appt. She has appt with PCP today and expects to have blood work drawn. Advised her to have creatinine drawn prior to CT appt only if not already done by PCP. Appt letter and lab order mailed.

## 2018-12-13 NOTE — Telephone Encounter (Signed)
PA for CT abd w/wo contrast submitted via [employer] website. Case went to clinical review, [facility] Tracking Number: [id number]. Will fax clinical notes after OV note is completed.

## 2018-12-14 LAB — CMP14+EGFR
ALT: 11 IU/L (ref 0–32)
AST: 12 IU/L (ref 0–40)
Albumin/Globulin Ratio: 2 (ref 1.2–2.2)
Albumin: 4.7 g/dL (ref 3.7–4.7)
Alkaline Phosphatase: 65 IU/L (ref 39–117)
BUN/Creatinine Ratio: 12 (ref 12–28)
BUN: 8 mg/dL (ref 8–27)
Bilirubin Total: 0.4 mg/dL (ref 0.0–1.2)
CO2: 25 mmol/L (ref 20–29)
Calcium: 10 mg/dL (ref 8.7–10.3)
Chloride: 100 mmol/L (ref 96–106)
Creatinine, Ser: 0.66 mg/dL (ref 0.57–1.00)
GFR calc Af Amer: 98 mL/min/{1.73_m2} (ref >59)
GFR calc non Af Amer: 85 mL/min/{1.73_m2} (ref >59)
Globulin, Total: 2.4 g/dL (ref 1.5–4.5)
Glucose: 194 mg/dL — ABNORMAL HIGH (ref 65–99)
Potassium: 3.8 mmol/L (ref 3.5–5.2)
Sodium: 140 mmol/L (ref 134–144)
Total Protein: 7.1 g/dL (ref 6.0–8.5)

## 2018-12-25 ENCOUNTER — Other Ambulatory Visit: Payer: Self-pay

## 2018-12-25 ENCOUNTER — Ambulatory Visit (HOSPITAL_COMMUNITY)
Admission: RE | Admit: 2018-12-25 | Discharge: 2018-12-25 | Disposition: A | Discharge status: Home / Self Care | Payer: Medicare HMO | Source: Ambulatory Visit | Attending: Internal Medicine | Admitting: Internal Medicine

## 2018-12-25 DIAGNOSIS — K863 Pseudocyst of pancreas: Secondary | ICD-10-CM | POA: Diagnosis not present

## 2018-12-25 DIAGNOSIS — K862 Cyst of pancreas: Secondary | ICD-10-CM | POA: Diagnosis not present

## 2018-12-25 DIAGNOSIS — K8689 Other specified diseases of pancreas: Secondary | ICD-10-CM | POA: Diagnosis not present

## 2018-12-25 MED ORDER — IOHEXOL 300 MG/ML  SOLN
100.0000 mL | Freq: Once | INTRAMUSCULAR | Status: AC | PRN
  Administered 2018-12-25: 12:00:00 100 mL via INTRAVENOUS

## 2019-01-08 ENCOUNTER — Telehealth: Payer: Self-pay | Admitting: Internal Medicine

## 2019-01-08 NOTE — Progress Notes (Signed)
Virtual Visit via Telephone Note  I connected with Heather Oconnor on 01/09/19 at 8:06 AM by telephone and verified that I am speaking with the correct person using two identifiers. EVYNN BOUTELLE is currently located at home and nobody is currently with her during this visit. The provider, Loman Brooklyn, FNP is located in their office at time of visit.  I discussed the limitations, risks, security and privacy concerns of performing an evaluation and management service by telephone and the availability of in person appointments. I also discussed with the patient that there may be a patient responsible charge related to this service. The patient expressed understanding and agreed to proceed.  Subjective: PCP: Loman Brooklyn, FNP  Chief Complaint  Patient presents with  . Hypertension   Patient has had a lot of changes with her blood pressure medications over the past few months.  Lisinopril and hydrochlorothiazide were discontinued due to pancreatitis.  Also while she was in the hospital her metoprolol was discontinued.  Furosemide was changed to as needed for edema.  Imdur was stopped due to lack of any chest pain whatsoever.  Hydralazine was increased to 3 times daily, she was started on clonidine 0.1 mg twice daily and then losartan 50 mg once daily which was increased to 100 mg at her last visit on 12/13/2018.  Throughout this she has continued amlodipine 10 mg once daily. Today she reports her blood pressure has remained high although it has come down some. Her average BP is 170-180s/80-90s. BP this morning was 163/87 which she reports is the best she has seen. She does take her BP after taking her medications. She is currently under a lot of stress as her husband is currently admitted to ICU at [facility] with COVID-19 and is on the vent with pneumonia.  She reports she is living a whole different life since he has been in the hospital for the past week.  She has never been  alone and he does mostly everything around the house.  Patient does have an appointment with the cardiologist on 01/24/2019.   ROS: Per HPI  Current Outpatient Medications:  .  acetaminophen (TYLENOL) 500 MG tablet, Take 2 tablets (1,000 mg total) by mouth every 6 (six) hours as needed., Disp: 12 tablet, Rfl: 0 .  amLODipine (NORVASC) 10 MG tablet, Take 1 tablet (10 mg total) by mouth daily., Disp: 90 tablet, Rfl: 1 .  aspirin EC 81 MG tablet, Take 1 tablet (81 mg total) by mouth daily with breakfast., Disp: 30 tablet, Rfl: 2 .  cloNIDine (CATAPRES) 0.1 MG tablet, Take 1 tablet (0.1 mg total) by mouth 2 (two) times daily. For Blood Pressure, Disp: 60 tablet, Rfl: 5 .  clopidogrel (PLAVIX) 75 MG tablet, Take 1 tablet (75 mg total) by mouth daily., Disp: 90 tablet, Rfl: 1 .  DORZOLAMIDE HCL-TIMOLOL MAL OP, Place 1 drop into the right eye daily. , Disp: , Rfl:  .  furosemide (LASIX) 40 MG tablet, Take 1 tablet (40 mg total) by mouth daily as needed for edema., Disp: 90 tablet, Rfl: 1 .  glipiZIDE (GLUCOTROL XL) 5 MG 24 hr tablet, Take 1 tablet (5 mg total) by mouth daily., Disp: 90 tablet, Rfl: 1 .  hydrALAZINE (APRESOLINE) 50 MG tablet, Take 1 tablet (50 mg total) by mouth 4 (four) times daily., Disp: 270 tablet, Rfl: 1 .  latanoprost (XALATAN) 0.005 % ophthalmic solution, Place 1 drop into the right eye nightly., Disp: , Rfl:  .  losartan (COZAAR) 100 MG tablet, Take 1 tablet (100 mg total) by mouth daily., Disp: 30 tablet, Rfl: 2 .  metFORMIN (GLUCOPHAGE) 500 MG tablet, Take 1 tablet (500 mg total) by mouth daily with breakfast AND 2 tablets (1,000 mg total) daily with supper., Disp: 270 tablet, Rfl: 0 .  Pancrelipase, Lip-Prot-Amyl, (CREON) 24000-76000 units CPEP, Take 2 capsules (48,000 Units total) by mouth 3 (three) times daily with meals AND 1 capsule (24,000 Units total) with snacks., Disp: 240 capsule, Rfl: 3 .  pantoprazole (PROTONIX) 40 MG tablet, Take 1 tablet (40 mg total) by mouth  daily., Disp: 90 tablet, Rfl: 2  Allergies  Allergen Reactions  . Hctz [Hydrochlorothiazide]     Per GI may have caused pancreatitis. Dr. Christella Hartigan recommended never resuming.  . Lisinopril     H/O pancreatitis.   Past Medical History:  Diagnosis Date  . Blind right eye   . CHF (congestive heart failure) (HCC)   . Cholecystitis   . Cirrhosis (HCC)   . COPD (chronic obstructive pulmonary disease) (HCC) 07/04/2018  . Diabetes (HCC)   . GERD (gastroesophageal reflux disease)   . Hemorrhoids   . Hypertension   . IBS (irritable bowel syndrome)   . Pancreatitis   . Transient cerebral ischemia 05/08/2008   Qualifier: Diagnosis of  By: Diana Eves    . Weakness 07/2018    Observations/Objective: A&O  No respiratory distress or wheezing audible over the phone Mood, judgement, and thought processes all WNL  Assessment and Plan: 1. Essential hypertension - Uncontrolled. Patient to continue amlodipine 10 mg once daily, clonidine 0.1 mg twice daily, losartan 100 mg once daily, and increase hydralazine 50 mg from 3 times a day to 4 times a day.  She was encouraged to keep her appointment with cardiologist later this month.  She will continue to keep track of her blood pressure at home. - hydrALAZINE (APRESOLINE) 50 MG tablet; Take 1 tablet (50 mg total) by mouth 4 (four) times daily.  Dispense: 270 tablet; Refill: 1  2. Close exposure to COVID-19 virus - Patient's husband is currently in the hospital with COVID-19.  She has been quarantined since last Wednesday and understands that she should quarantine until next Wednesday which would be 2 weeks.  We discussed signs and symptoms of COVID-19 for her to be on the look out for.  Discussed that she could take zinc and vitamin C to help boost her immune system.   Follow Up Instructions:  I discussed the assessment and treatment plan with the patient. The patient was provided an opportunity to ask questions and all were answered. The patient  agreed with the plan and demonstrated an understanding of the instructions.   The patient was advised to call back or seek an in-person evaluation if the symptoms worsen or if the condition fails to improve as anticipated.  The above assessment and management plan was discussed with the patient. The patient verbalized understanding of and has agreed to the management plan. Patient is aware to call the clinic if symptoms persist or worsen. Patient is aware when to return to the clinic for a follow-up visit. Patient educated on when it is appropriate to go to the emergency department.   Time call ended: 8:20 AM  I provided 17 minutes of non-face-to-face time during this encounter.  Deliah Boston, MSN, APRN, FNP-C [facility] 01/09/19

## 2019-01-08 NOTE — Telephone Encounter (Signed)
Spoke with pt. Pt is aware of AB's recommendations and was advised to give a progress report in 24-48 hours.

## 2019-01-08 NOTE — Telephone Encounter (Signed)
[phone] PLEASE CALL PATIENT, SHE HAS QUESTIONS ABOUT A MEDICATION

## 2019-01-08 NOTE — Telephone Encounter (Signed)
Pt restarted Creon at her last ov. Pt has a hx of diarrhea.  Pt states that she is having more diarrhea since she has started back on the Creon. Pt wants to know if this is something that she can d/c or if it needs to be tapered off. Pt isn't able to be the number of times that she is having bowel movements, but states that her bowels move every time she uses the bathroom. Pt hasn't been on any recent antibiotics and doesn't report any blood seen in toilet. Please advise in the absence of RMR.

## 2019-01-08 NOTE — Telephone Encounter (Signed)
She can stop Creon for now. May need to trial Zenpep instead. If diarrhea persists, we need to do stool studies. Please have her give progress report in 24-48 hours.

## 2019-01-09 ENCOUNTER — Ambulatory Visit (INDEPENDENT_AMBULATORY_CARE_PROVIDER_SITE_OTHER): Payer: Medicare HMO | Admitting: Family Medicine

## 2019-01-09 ENCOUNTER — Encounter: Payer: Self-pay | Admitting: Family Medicine

## 2019-01-09 DIAGNOSIS — Z20822 Contact with and (suspected) exposure to covid-19: Secondary | ICD-10-CM

## 2019-01-09 DIAGNOSIS — I10 Essential (primary) hypertension: Secondary | ICD-10-CM

## 2019-01-09 MED ORDER — HYDRALAZINE HCL 50 MG PO TABS: 50.0000 mg | ORAL_TABLET | Freq: Four times a day (QID) | ORAL | 1 refills | Status: DC

## 2019-01-22 NOTE — Progress Notes (Signed)
Cardiology Office Note   Date:  01/24/2019   ID:  Docie, Abramovich [birth year], MRN [id number]  PCP:  Gwenlyn Fudge, FNP  Cardiologist:   Rollene Rotunda, MD Referring:  Gwenlyn Fudge, FNP   Chief Complaint  Patient presents with  . Hypertension      History of Present Illness: Heather Oconnor is a 79 y.o. female who presents for evaluation of difficult to control hypertension.  She has no past cardiac history other than stress test years ago with sounds like a perfusion study by her description.  She was in the hospital in June and I reviewed these records.  She had pancreatitis and respiratory failure.  To evaluate that she had an echo in June of 2020 demonstrated a normal EF and moderate aortic sclerosis.  There was a question of some heart failure with diastolic dysfunction.  There is also mention of COPD though she denies of this.  She said her blood pressure since then has been very difficult to control.  She brings a blood pressure diary and she sometimes has systolics in the 200s.  It is not usually well controlled although she has had titration of her medications upward.  She says she was not really checking her blood pressure frequently before and when I look back at old readings it was not unusual to see systolics in the 180s when she was at doctor's appointments.  She denies any new cardiovascular symptoms.  She has had no new shortness of breath, PND or orthopnea.  She does not have any palpitations, presyncope or syncope.  She is not describing chest discomfort.  Of note she is under stress because her husband is in the ICU with Covid.  He is intubated.   Past Medical History:  Diagnosis Date  . Blind right eye   . Cholecystitis   . Cirrhosis (HCC)   . Diabetes (HCC)   . GERD (gastroesophageal reflux disease)   . Hemorrhoids   . Hypertension   . IBS (irritable bowel syndrome)   . Pancreatitis   . Transient cerebral ischemia 05/08/2008   Qualifier:  Diagnosis of  By: Diana Eves      Past Surgical History:  Procedure Laterality Date  . COLONOSCOPY  09/07/2007   RMR: anal canal/external hemorrhoids, redundant colon, left-sided diverticula, otherwise normal colonic mucosa     Current Outpatient Medications  Medication Sig Dispense Refill  . acetaminophen (TYLENOL) 500 MG tablet Take 2 tablets (1,000 mg total) by mouth every 6 (six) hours as needed. 12 tablet 0  . amLODipine (NORVASC) 10 MG tablet Take 1 tablet (10 mg total) by mouth daily. 90 tablet 1  . aspirin EC 81 MG tablet Take 1 tablet (81 mg total) by mouth daily with breakfast. 30 tablet 2  . cloNIDine (CATAPRES) 0.1 MG tablet Take 1 tablet (0.1 mg total) by mouth 2 (two) times daily. For Blood Pressure 60 tablet 5  . clopidogrel (PLAVIX) 75 MG tablet Take 1 tablet (75 mg total) by mouth daily. 90 tablet 1  . DORZOLAMIDE HCL-TIMOLOL MAL OP Place 1 drop into the right eye daily.     Marland Kitchen glipiZIDE (GLUCOTROL XL) 5 MG 24 hr tablet Take 1 tablet (5 mg total) by mouth daily. 90 tablet 1  . hydrALAZINE (APRESOLINE) 50 MG tablet Take 1 tablet (50 mg total) by mouth 4 (four) times daily. 270 tablet 1  . latanoprost (XALATAN) 0.005 % ophthalmic solution Place 1 drop into the right eye nightly.    Marland Kitchen  losartan (COZAAR) 100 MG tablet Take 1 tablet (100 mg total) by mouth daily. 30 tablet 2  . metFORMIN (GLUCOPHAGE) 500 MG tablet Take 1 tablet (500 mg total) by mouth daily with breakfast AND 2 tablets (1,000 mg total) daily with supper. 270 tablet 0  . pantoprazole (PROTONIX) 40 MG tablet Take 1 tablet (40 mg total) by mouth daily. 90 tablet 2  . cloNIDine (CATAPRES - DOSED IN MG/24 HR) 0.2 mg/24hr patch Place 1 patch (0.2 mg total) onto the skin once a week. 4 patch 12  . furosemide (LASIX) 40 MG tablet Take 1 tablet (40 mg total) by mouth daily as needed for edema. (Patient not taking: Reported on 01/24/2019) 90 tablet 1   No current facility-administered medications for this visit.     Allergies:   Hctz [hydrochlorothiazide] and Lisinopril    Social History:  The patient  reports that she quit smoking about 12 years ago. She quit after 9.00 years of use. She has never used smokeless tobacco. She reports that she does not drink alcohol or use drugs.   Family History:  The patient's family history includes Diabetes in her son; Hypertension in her brother, brother, father, mother, sister, and son.    ROS:  Please see the history of present illness.   Otherwise, review of systems are positive for none.   All other systems are reviewed and negative.    PHYSICAL EXAM: VS:  BP (!) 190/86   Pulse (!) 104   Ht 5\' 4"  (1.626 m)   Wt 159 lb (72.1 kg)   BMI 27.29 kg/m  , BMI Body mass index is 27.29 kg/m. GENERAL:  Well appearing HEENT:  Pupils equal round and reactive, fundi not visualized, oral mucosa unremarkable NECK:  No jugular venous distention, waveform within normal limits, carotid upstroke brisk and symmetric, no bruits, no thyromegaly LYMPHATICS:  No cervical, inguinal adenopathy LUNGS:  Clear to auscultation bilaterally BACK:  No CVA tenderness CHEST:  Unremarkable HEART:  PMI not displaced or sustained,S1 and S2 within normal limits, no S3, no S4, no clicks, no rubs, very brief soft apical systolic murmur, no diastolic murmurs ABD:  Flat, positive bowel sounds normal in frequency in pitch, no bruits, no rebound, no guarding, no midline pulsatile mass, no hepatomegaly, no splenomegaly EXT:  2 plus pulses throughout, no edema, no cyanosis no clubbing SKIN:  No rashes no nodules NEURO:  Cranial nerves II through XII grossly intact, motor grossly intact throughout PSYCH:  Cognitively intact, oriented to person place and time    EKG:  EKG is not ordered today. The ekg ordered 07/04/2018 demonstrates sinus rhythm, rate 96, premature atrial contractions, poor anterior R wave progression, no acute ST-T wave changes.   Recent Labs: 07/11/2018: Magnesium  1.7 07/12/2018: Hemoglobin 9.5; Platelets 180 11/02/2018: TSH 1.820 12/13/2018: ALT 11; BUN 8; Creatinine, Ser 0.66; Potassium 3.8; Sodium 140   Lab Results  Component Value Date   TSH 1.820 11/02/2018    Lipid Panel    Component Value Date/Time   CHOL 183 06/12/2018 0815   TRIG 133 06/12/2018 0815   HDL 40 (L) 06/12/2018 0815   CHOLHDL 4.6 06/12/2018 0815   VLDL 27 06/12/2018 0815   LDLCALC 116 (H) 06/12/2018 0815      Wt Readings from Last 3 Encounters:  01/24/19 159 lb (72.1 kg)  12/13/18 165 lb (74.8 kg)  12/12/18 164 lb (74.4 kg)      Other studies Reviewed: Additional studies/ records that were reviewed today include:  Hospital records. Review of the above records demonstrates:  Please see elsewhere in the note.     ASSESSMENT AND PLAN:  HTN:   The patient has difficult to control hypertension.  We are going to do a stepwise progression of her therapy.  I had like to get her off the clonidine p.o. and change it to clonidine patch #2.  If I cannot do this on most likely add spironolactone and eventually be trying to reduce her hydralazine which is currently had a complicated 4 times daily dosing regimen.  She and I had a long discussion about this probably taking multiple steps.  I will have to be careful to avoid anything that could exacerbate pancreatitis.  Apparently there was no clear etiology for this in the past.  There is a mention of cirrhosis but she does not recall anything about this.  Do see that her most recent liver enzymes were normal.  Renal function was normal.  Of note I do not see evidence of any secondary cause.  She had a normal TSH in October.  I went back and saw her scans from July and she had normal renal size and no evidence of extensive atherosclerosis although she did not specifically have a renal arteriogram.  MURMUR:  She has aortic sclerosis.  No change in therapy.  COVID EDUCATION: She is not interested in the vaccine despite what is happening  husband.  We did talk about this   Current medicines are reviewed at length with the patient today.  The patient does not have concerns regarding medicines.  The following changes have been made:  As above  Labs/ tests ordered today include: None No orders of the defined types were placed in this encounter.    Disposition:   FU with me in one month.     Signed, Rollene Rotunda, MD  01/24/2019 12:41 PM    [facility]

## 2019-01-24 ENCOUNTER — Other Ambulatory Visit: Payer: Self-pay | Admitting: Cardiology

## 2019-01-24 ENCOUNTER — Encounter: Payer: Self-pay | Admitting: Cardiology

## 2019-01-24 ENCOUNTER — Ambulatory Visit: Payer: Medicare HMO | Admitting: Cardiology

## 2019-01-24 VITALS — BP 190/86 | HR 104 | Ht 64.0 in | Wt 159.0 lb

## 2019-01-24 DIAGNOSIS — I700 Atherosclerosis of aorta: Secondary | ICD-10-CM

## 2019-01-24 DIAGNOSIS — R011 Cardiac murmur, unspecified: Secondary | ICD-10-CM | POA: Diagnosis not present

## 2019-01-24 DIAGNOSIS — Z7189 Other specified counseling: Secondary | ICD-10-CM | POA: Insufficient documentation

## 2019-01-24 DIAGNOSIS — I10 Essential (primary) hypertension: Secondary | ICD-10-CM | POA: Diagnosis not present

## 2019-01-24 MED ORDER — SPIRONOLACTONE 25 MG PO TABS: 25.0000 mg | ORAL_TABLET | Freq: Every day | ORAL | 3 refills | Status: DC

## 2019-01-24 MED ORDER — CLONIDINE 0.2 MG/24HR TD PTWK: 0.2000 mg | MEDICATED_PATCH | TRANSDERMAL | 12 refills | Status: DC

## 2019-01-24 NOTE — Telephone Encounter (Signed)
Reviewed with Dr Antoine Poche - ordered Spironolactone 25 mg daily and to continue on Clonidine 0.1 mg twice a day.    Pt is aware and new RX has been sent into pharmacy.  All questions answered for the patient at the time of the call.

## 2019-01-24 NOTE — Telephone Encounter (Signed)
Pt can not afford Catapres patch.  Will ask Dr Antoine Poche what he would like to change her to.

## 2019-01-24 NOTE — Patient Instructions (Signed)
Medication Instructions:  Please start Catapres patch 0.2 weekly.  Once you start this, discontinue your Clonidine 0.1 mg tablets.  Continue all other medications as listed.  *If you need a refill on your cardiac medications before your next appointment, please call your pharmacy*  Follow-Up: At [facility], you and your health needs are our priority.  As part of our continuing mission to provide you with exceptional heart care, we have created designated Provider Care Teams.  These Care Teams include your primary Cardiologist (physician) and Advanced Practice Providers (APPs -  Physician Assistants and Nurse Practitioners) who all work together to provide you with the care you need, when you need it.  Your next appointment:   1 month  The format for your next appointment:   In Person  Provider:   Rollene Rotunda, MD  Thank you for choosing [facility]!!

## 2019-01-24 NOTE — Telephone Encounter (Signed)
New Message:     Pt said the medicine that Dr Antoine Poche just called in for he todayr is too expensive. She would like for him to call in something else

## 2019-01-26 ENCOUNTER — Other Ambulatory Visit: Payer: Self-pay | Admitting: *Deleted

## 2019-01-26 DIAGNOSIS — K2100 Gastro-esophageal reflux disease with esophagitis, without bleeding: Secondary | ICD-10-CM

## 2019-01-26 DIAGNOSIS — I5032 Chronic diastolic (congestive) heart failure: Secondary | ICD-10-CM

## 2019-01-26 DIAGNOSIS — H1032 Unspecified acute conjunctivitis, left eye: Secondary | ICD-10-CM | POA: Diagnosis not present

## 2019-01-26 DIAGNOSIS — Z8673 Personal history of transient ischemic attack (TIA), and cerebral infarction without residual deficits: Secondary | ICD-10-CM

## 2019-01-26 DIAGNOSIS — I10 Essential (primary) hypertension: Secondary | ICD-10-CM

## 2019-01-26 MED ORDER — CLOPIDOGREL BISULFATE 75 MG PO TABS: 75.0000 mg | ORAL_TABLET | Freq: Every day | ORAL | 0 refills | Status: DC

## 2019-01-26 MED ORDER — AMLODIPINE BESYLATE 10 MG PO TABS: 10.0000 mg | ORAL_TABLET | Freq: Every day | ORAL | 1 refills | Status: DC

## 2019-01-27 ENCOUNTER — Telehealth: Payer: Self-pay | Admitting: Nurse Practitioner

## 2019-01-27 MED ORDER — ALPRAZOLAM 0.25 MG PO TABS: 0.2500 mg | ORAL_TABLET | Freq: Two times a day (BID) | ORAL | 0 refills | Status: DC | PRN

## 2019-01-27 NOTE — Telephone Encounter (Signed)
Her husband passed away this morning and patient is very anxious and they need something to calm her down  Meds ordered this encounter  Medications  . ALPRAZolam (XANAX) 0.25 MG tablet    Sig: Take 1 tablet (0.25 mg total) by mouth 2 (two) times daily as needed for anxiety.    Dispense:  10 tablet    Refill:  0    Order Specific Question:   Supervising Provider    Answer:   Eber Hong [[id number]]

## 2019-01-31 ENCOUNTER — Telehealth: Payer: Self-pay | Admitting: Family Medicine

## 2019-01-31 ENCOUNTER — Other Ambulatory Visit: Payer: Self-pay | Admitting: Family Medicine

## 2019-01-31 DIAGNOSIS — Z72820 Sleep deprivation: Secondary | ICD-10-CM

## 2019-01-31 DIAGNOSIS — Z634 Disappearance and death of family member: Secondary | ICD-10-CM

## 2019-01-31 MED ORDER — CLONAZEPAM 0.5 MG PO TABS: 0.2500 mg | ORAL_TABLET | Freq: Two times a day (BID) | ORAL | 0 refills | Status: DC | PRN

## 2019-01-31 NOTE — Telephone Encounter (Signed)
I have called and discussed with patient. See orders encounter.

## 2019-01-31 NOTE — Progress Notes (Signed)
Number provided for patient in telephone encounter went to her daughter-in-law [name] who is not on her HIPAA form.  I have called Ms. Heather Oconnor and spoke with her.  Advised her to update her HIPAA form.  We are going to stop the Xanax and start her on clonazepam 0.25-0.5 mg twice daily as needed.  We did discuss that if she requires this long-term she will need to be started on a maintenance medication as this cannot be a long-term medication but that we will continue for now.

## 2019-02-06 ENCOUNTER — Ambulatory Visit: Payer: Medicare HMO | Admitting: Internal Medicine

## 2019-02-15 DIAGNOSIS — H1032 Unspecified acute conjunctivitis, left eye: Secondary | ICD-10-CM | POA: Diagnosis not present

## 2019-02-15 DIAGNOSIS — H1045 Other chronic allergic conjunctivitis: Secondary | ICD-10-CM | POA: Diagnosis not present

## 2019-02-17 ENCOUNTER — Other Ambulatory Visit: Payer: Self-pay | Admitting: Family Medicine

## 2019-02-17 DIAGNOSIS — I10 Essential (primary) hypertension: Secondary | ICD-10-CM

## 2019-02-26 NOTE — Progress Notes (Signed)
Cardiology Office Note   Date:  02/28/2019   ID:  Vendela, Troung [birth year], MRN [id number]  PCP:  Loman Brooklyn, FNP  Cardiologist:   Minus Breeding, MD Referring:  Loman Brooklyn, FNP   Chief Complaint  Patient presents with  . Hypertension      History of Present Illness: Heather Oconnor is a 79 y.o. female who presents for evaluation of difficult to control hypertension.  She has no past cardiac history other than stress test years ago with sounds like a perfusion study by her description.  She was in the hospital in June and I reviewed these records.  She had pancreatitis and respiratory failure.  To evaluate that she had an echo in June of 2020 demonstrated a normal EF and moderate aortic sclerosis.  There was a question of some heart failure with diastolic dysfunction.  There is also mention of COPD though she denies of this.  She has had difficult to control BPs.  Her husband died recently from [place].     She has been stressed.  BPs have been elevated in the 170s often.  The patient denies any new symptoms such as chest discomfort, neck or arm discomfort. There has been no new shortness of breath, PND or orthopnea. There have been no reported palpitations, presyncope or syncope.   Past Medical History:  Diagnosis Date  . Blind right eye   . Cholecystitis   . Cirrhosis ([place])   . Diabetes ([place])   . GERD (gastroesophageal reflux disease)   . Hemorrhoids   . Hypertension   . IBS (irritable bowel syndrome)   . Pancreatitis   . Transient cerebral ischemia 05/08/2008   Qualifier: Diagnosis of  By: Zeb Comfort      Past Surgical History:  Procedure Laterality Date  . COLONOSCOPY  09/07/2007   RMR: anal canal/external hemorrhoids, redundant colon, left-sided diverticula, otherwise normal colonic mucosa     Current Outpatient Medications  Medication Sig Dispense Refill  . acetaminophen (TYLENOL) 500 MG tablet Take 2 tablets (1,000 mg total) by mouth  every 6 (six) hours as needed. 12 tablet 0  . amLODipine (NORVASC) 10 MG tablet Take 1 tablet (10 mg total) by mouth daily. 90 tablet 1  . aspirin EC 81 MG tablet Take 1 tablet (81 mg total) by mouth daily with breakfast. 30 tablet 2  . clonazePAM (KLONOPIN) 0.5 MG tablet Take 0.5-1 tablets (0.25-0.5 mg total) by mouth 2 (two) times daily as needed for anxiety. 30 tablet 0  . clopidogrel (PLAVIX) 75 MG tablet Take 1 tablet (75 mg total) by mouth daily. 90 tablet 0  . DORZOLAMIDE HCL-TIMOLOL MAL OP Place 1 drop into the right eye daily.     . furosemide (LASIX) 40 MG tablet Take 1 tablet (40 mg total) by mouth daily as needed for edema. 90 tablet 1  . glipiZIDE (GLUCOTROL XL) 5 MG 24 hr tablet Take 1 tablet (5 mg total) by mouth daily. 90 tablet 1  . hydrALAZINE (APRESOLINE) 50 MG tablet Take 1 tablet (50 mg total) by mouth 4 (four) times daily. 270 tablet 1  . latanoprost (XALATAN) 0.005 % ophthalmic solution Place 1 drop into the right eye nightly.    Marland Kitchen losartan (COZAAR) 100 MG tablet TAKE ONE (1) TABLET EACH DAY 30 tablet 2  . metFORMIN (GLUCOPHAGE) 500 MG tablet Take 1 tablet (500 mg total) by mouth daily with breakfast AND 2 tablets (1,000 mg total) daily with supper. 270 tablet  0  . pantoprazole (PROTONIX) 40 MG tablet Take 1 tablet (40 mg total) by mouth daily. 90 tablet 2  . spironolactone (ALDACTONE) 25 MG tablet Take 1 tablet (25 mg total) by mouth daily. 90 tablet 3  . cloNIDine (CATAPRES) 0.2 MG tablet Take 1 tablet (0.2 mg total) by mouth 2 (two) times daily. 180 tablet 3   No current facility-administered medications for this visit.    Allergies:   Hctz [hydrochlorothiazide] and Lisinopril    ROS:  Please see the history of present illness.   Otherwise, review of systems are positive for none.   All other systems are reviewed and negative.    PHYSICAL EXAM: VS:  BP (!) 160/82   Pulse (!) 104   Ht 5\' 4"  (1.626 m)   Wt 156 lb (70.8 kg)   BMI 26.78 kg/m  , BMI Body mass index  is 26.78 kg/m. GENERAL:  Well appearing NECK:  No jugular venous distention, waveform within normal limits, carotid upstroke brisk and symmetric, no bruits, no thyromegaly LUNGS:  Clear to auscultation bilaterally CHEST:  Unremarkable HEART:  PMI not displaced or sustained,S1 and S2 within normal limits, no S3, no S4, no clicks, no rubs, no murmurs ABD:  Flat, positive bowel sounds normal in frequency in pitch, no bruits, no rebound, no guarding, no midline pulsatile mass, no hepatomegaly, no splenomegaly EXT:  2 plus pulses throughout, no edema, no cyanosis no clubbing   EKG:  EKG not ordered today.   Recent Labs: 07/11/2018: Magnesium 1.7 07/12/2018: Hemoglobin 9.5; Platelets 180 11/02/2018: TSH 1.820 12/13/2018: ALT 11; BUN 8; Creatinine, Ser 0.66; Potassium 3.8; Sodium 140   Lab Results  Component Value Date   TSH 1.820 11/02/2018    Lipid Panel    Component Value Date/Time   CHOL 183 06/12/2018 0815   TRIG 133 06/12/2018 0815   HDL 40 (L) 06/12/2018 0815   CHOLHDL 4.6 06/12/2018 0815   VLDL 27 06/12/2018 0815   LDLCALC 116 (H) 06/12/2018 0815      Wt Readings from Last 3 Encounters:  02/28/19 156 lb (70.8 kg)  01/24/19 159 lb (72.1 kg)  12/13/18 165 lb (74.8 kg)      Other studies Reviewed: Additional studies/ records that were reviewed today include: None Review of the above records demonstrates:  NA     ASSESSMENT AND PLAN:  HTN:    Try to get her on a clonidine patch but her insurance would not cover it.  Again increase her clonidine to 0.2 mg twice daily.  The next step would be spironolactone to be increased.  I will get a basic metabolic profile since I started spironolactone recently.  MURMUR:  She has aortic sclerosis.  No change in therapy.   COVID EDUCATION:   She does think she will get the vaccine now.  Is not thinking about getting it previously.  Current medicines are reviewed at length with the patient today.  The patient does not have concerns  regarding medicines.  The following changes have been made:  As above  Labs/ tests ordered today include: NA  Orders Placed This Encounter  Procedures  . Basic metabolic panel     Disposition:   FU with me in one month.     Signed, 14/09/20, MD  02/28/2019 11:42 AM    [facility]

## 2019-02-28 ENCOUNTER — Other Ambulatory Visit: Payer: Self-pay

## 2019-02-28 ENCOUNTER — Encounter: Payer: Self-pay | Admitting: Cardiology

## 2019-02-28 ENCOUNTER — Ambulatory Visit (INDEPENDENT_AMBULATORY_CARE_PROVIDER_SITE_OTHER): Payer: Medicare HMO | Admitting: Cardiology

## 2019-02-28 VITALS — BP 160/82 | HR 104 | Ht 64.0 in | Wt 156.0 lb

## 2019-02-28 DIAGNOSIS — I10 Essential (primary) hypertension: Secondary | ICD-10-CM

## 2019-02-28 DIAGNOSIS — Z79899 Other long term (current) drug therapy: Secondary | ICD-10-CM | POA: Diagnosis not present

## 2019-02-28 MED ORDER — CLONIDINE HCL 0.2 MG PO TABS: 0.2000 mg | ORAL_TABLET | Freq: Two times a day (BID) | ORAL | 3 refills | Status: DC

## 2019-02-28 NOTE — Patient Instructions (Signed)
Medication Instructions:  Please increase Clonidine to 0.2 mg one tablet twice a day.  Continue all other medications as listed.  *If you need a refill on your cardiac medications before your next appointment, please call your pharmacy*  Lab:  Please have BMP   Follow-Up: At [facility], you and your health needs are our priority.  As part of our continuing mission to provide you with exceptional heart care, we have created designated Provider Care Teams.  These Care Teams include your primary Cardiologist (physician) and Advanced Practice Providers (APPs -  Physician Assistants and Nurse Practitioners) who all work together to provide you with the care you need, when you need it.  Your next appointment:   4 week(s)  The format for your next appointment:   In Person  Provider:   Rollene Rotunda, MD  Thank you for choosing [facility]!!

## 2019-03-01 ENCOUNTER — Other Ambulatory Visit: Payer: Medicare HMO

## 2019-03-01 ENCOUNTER — Other Ambulatory Visit: Payer: Self-pay

## 2019-03-01 DIAGNOSIS — I10 Essential (primary) hypertension: Secondary | ICD-10-CM

## 2019-03-01 DIAGNOSIS — Z79899 Other long term (current) drug therapy: Secondary | ICD-10-CM

## 2019-03-02 LAB — BASIC METABOLIC PANEL
BUN/Creatinine Ratio: 13 (ref 12–28)
BUN: 9 mg/dL (ref 8–27)
CO2: 23 mmol/L (ref 20–29)
Calcium: 10.2 mg/dL (ref 8.7–10.3)
Chloride: 103 mmol/L (ref 96–106)
Creatinine, Ser: 0.69 mg/dL (ref 0.57–1.00)
GFR calc Af Amer: 96 mL/min/{1.73_m2} (ref >59)
GFR calc non Af Amer: 84 mL/min/{1.73_m2} (ref >59)
Glucose: 118 mg/dL — ABNORMAL HIGH (ref 65–99)
Potassium: 4.2 mmol/L (ref 3.5–5.2)
Sodium: 144 mmol/L (ref 134–144)

## 2019-03-19 DIAGNOSIS — R011 Cardiac murmur, unspecified: Secondary | ICD-10-CM | POA: Insufficient documentation

## 2019-03-19 NOTE — Progress Notes (Signed)
Cardiology Office Note   Date:  03/21/2019   ID:  Suellen, Durocher February 08, 1940, MRN [id number]  PCP:  Loman Brooklyn, FNP  Cardiologist:   Minus Breeding, MD   Chief Complaint  Patient presents with  . Hypertension      History of Present Illness: Heather Oconnor is a 79 y.o. female who presents for evaluation of difficult to control hypertension.  She has no past cardiac history other than stress test years ago with sounds like a perfusion study.  She was in the hospital in June 2020.   She had pancreatitis and respiratory failure.  To evaluate that she had an echo in June of 2020 demonstrated a normal EF and moderate aortic sclerosis.  There was a question of some heart failure with diastolic dysfunction.  There is also mention of COPD though she denies of this.  She has had difficult to control BPs.  Her husband died recently from [place].    Blood pressure diary.  Her blood pressures are better controlled.  However, he typically runs in the 140s 150s in the morning and 176H systolic in the evenings or afternoons.  Her diastolics are usually in the 70s to 26s.  She feels okay.  She is not having any chest pressure, neck or arm discomfort.  She had no new shortness of breath, PND or orthopnea.   Past Medical History:  Diagnosis Date  . Blind right eye   . Cholecystitis   . Cirrhosis ([place])   . Diabetes ([place])   . GERD (gastroesophageal reflux disease)   . Hemorrhoids   . Hypertension   . IBS (irritable bowel syndrome)   . Pancreatitis   . Transient cerebral ischemia 05/08/2008   Qualifier: Diagnosis of  By: Zeb Comfort      Past Surgical History:  Procedure Laterality Date  . COLONOSCOPY  09/07/2007   RMR: anal canal/external hemorrhoids, redundant colon, left-sided diverticula, otherwise normal colonic mucosa     Current Outpatient Medications  Medication Sig Dispense Refill  . acetaminophen (TYLENOL) 500 MG tablet Take 2 tablets (1,000 mg total) by mouth  every 6 (six) hours as needed. 12 tablet 0  . amLODipine (NORVASC) 10 MG tablet Take 1 tablet (10 mg total) by mouth daily. 90 tablet 1  . aspirin EC 81 MG tablet Take 1 tablet (81 mg total) by mouth daily with breakfast. 30 tablet 2  . clonazePAM (KLONOPIN) 0.5 MG tablet Take 0.5-1 tablets (0.25-0.5 mg total) by mouth 2 (two) times daily as needed for anxiety. 30 tablet 0  . cloNIDine (CATAPRES) 0.2 MG tablet Take 1 tablet (0.2 mg total) by mouth 2 (two) times daily. 180 tablet 3  . clopidogrel (PLAVIX) 75 MG tablet Take 1 tablet (75 mg total) by mouth daily. 90 tablet 0  . DORZOLAMIDE HCL-TIMOLOL MAL OP Place 1 drop into the right eye daily.     . furosemide (LASIX) 40 MG tablet Take 1 tablet (40 mg total) by mouth daily as needed for edema. 90 tablet 1  . glipiZIDE (GLUCOTROL XL) 5 MG 24 hr tablet Take 1 tablet (5 mg total) by mouth daily. 90 tablet 1  . hydrALAZINE (APRESOLINE) 50 MG tablet Take 1 tablet (50 mg total) by mouth as directed. Take (1) tab in AM (2) tabs afternoon (2) tabs evening 450 tablet 3  . latanoprost (XALATAN) 0.005 % ophthalmic solution Place 1 drop into the right eye nightly.    Marland Kitchen losartan (COZAAR) 100 MG tablet TAKE  ONE (1) TABLET EACH DAY 30 tablet 2  . metFORMIN (GLUCOPHAGE) 500 MG tablet Take 1 tablet (500 mg total) by mouth daily with breakfast AND 2 tablets (1,000 mg total) daily with supper. 270 tablet 0  . pantoprazole (PROTONIX) 40 MG tablet Take 1 tablet (40 mg total) by mouth daily. 90 tablet 2  . spironolactone (ALDACTONE) 25 MG tablet Take 1 tablet (25 mg total) by mouth daily. 90 tablet 3   No current facility-administered medications for this visit.    Allergies:   Hctz [hydrochlorothiazide] and Lisinopril    ROS:  Please see the history of present illness.   Otherwise, review of systems are positive for none.   All other systems are reviewed and negative.    PHYSICAL EXAM: VS:  BP (!) 144/78   Pulse 84   Ht 5\' 4"  (1.626 m)   Wt 156 lb (70.8 kg)    BMI 26.78 kg/m  , BMI Body mass index is 26.78 kg/m. GENERAL:  Well appearing NECK:  No jugular venous distention, waveform within normal limits, carotid upstroke brisk and symmetric, no bruits, no thyromegaly LUNGS:  Clear to auscultation bilaterally CHEST:  Unremarkable HEART:  PMI not displaced or sustained,S1 and S2 within normal limits, no S3, no S4, no clicks, no rubs, no murmurs ABD:  Flat, positive bowel sounds normal in frequency in pitch, no bruits, no rebound, no guarding, no midline pulsatile mass, no hepatomegaly, no splenomegaly EXT:  2 plus pulses throughout, no edema, no cyanosis no clubbing   EKG:  EKG not ordered today.   Recent Labs: 07/11/2018: Magnesium 1.7 07/12/2018: Hemoglobin 9.5; Platelets 180 11/02/2018: TSH 1.820 12/13/2018: ALT 11 03/01/2019: BUN 9; Creatinine, Ser 0.69; Potassium 4.2; Sodium 144   Lab Results  Component Value Date   TSH 1.820 11/02/2018    Lipid Panel    Component Value Date/Time   CHOL 183 06/12/2018 0815   TRIG 133 06/12/2018 0815   HDL 40 (L) 06/12/2018 0815   CHOLHDL 4.6 06/12/2018 0815   VLDL 27 06/12/2018 0815   LDLCALC 116 (H) 06/12/2018 0815      Wt Readings from Last 3 Encounters:  03/21/19 156 lb (70.8 kg)  02/28/19 156 lb (70.8 kg)  01/24/19 159 lb (72.1 kg)      Other studies Reviewed: Additional studies/ records that were reviewed today include: None Review of the above records demonstrates:  NA   ASSESSMENT AND PLAN:  HTN:    Blood pressures are not yet at target.  She is taking her hydralazine 1 pill in the morning, 1 pill at noon and 2 pills at night.  I am going to have her increase by taking additional pill at noon.  Otherwise no change in therapy.   MURMUR: She has aortic sclerosis.  No change in therapy.   COVID EDUCATION:   She absolutely does not want the vaccine. getting it previously.   Current medicines are reviewed at length with the patient today.  The patient does not have concerns  regarding medicines.  The following changes have been made:  As above  Labs/ tests ordered today include: None  No orders of the defined types were placed in this encounter.    Disposition:   FU with me in six months.     Signed, 01/26/19, MD  03/21/2019 12:14 PM    [facility]

## 2019-03-21 ENCOUNTER — Encounter: Payer: Self-pay | Admitting: Cardiology

## 2019-03-21 ENCOUNTER — Other Ambulatory Visit: Payer: Self-pay

## 2019-03-21 ENCOUNTER — Ambulatory Visit: Payer: Medicare HMO | Admitting: Cardiology

## 2019-03-21 VITALS — BP 144/78 | HR 84 | Ht 64.0 in | Wt 156.0 lb

## 2019-03-21 DIAGNOSIS — Z7189 Other specified counseling: Secondary | ICD-10-CM | POA: Diagnosis not present

## 2019-03-21 DIAGNOSIS — I10 Essential (primary) hypertension: Secondary | ICD-10-CM

## 2019-03-21 DIAGNOSIS — R011 Cardiac murmur, unspecified: Secondary | ICD-10-CM

## 2019-03-21 MED ORDER — HYDRALAZINE HCL 50 MG PO TABS: 50.0000 mg | ORAL_TABLET | ORAL | 3 refills | Status: DC

## 2019-03-21 NOTE — Patient Instructions (Signed)
Medication Instructions:  Please increase Hydralazine to 50 mg in AM, 2 tabs afternoon and 2 tabs evening. Continue all other medications as listed.  *If you need a refill on your cardiac medications before your next appointment, please call your pharmacy*  Follow-Up: At [facility], you and your health needs are our priority.  As part of our continuing mission to provide you with exceptional heart care, we have created designated Provider Care Teams.  These Care Teams include your primary Cardiologist (physician) and Advanced Practice Providers (APPs -  Physician Assistants and Nurse Practitioners) who all work together to provide you with the care you need, when you need it.  We recommend signing up for the patient portal called "MyChart".  Sign up information is provided on this After Visit Summary.  MyChart is used to connect with patients for Virtual Visits (Telemedicine).  Patients are able to view lab/test results, encounter notes, upcoming appointments, etc.  Non-urgent messages can be sent to your provider as well.   To learn more about what you can do with MyChart, go to [email or web address].    Your next appointment:   6 month(s)  The format for your next appointment:   In Person  Provider:   Rollene Rotunda, MD   Thank you for choosing [facility]!!

## 2019-03-22 DIAGNOSIS — H02135 Senile ectropion of left lower eyelid: Secondary | ICD-10-CM | POA: Diagnosis not present

## 2019-03-22 DIAGNOSIS — E119 Type 2 diabetes mellitus without complications: Secondary | ICD-10-CM | POA: Diagnosis not present

## 2019-03-22 DIAGNOSIS — H401113 Primary open-angle glaucoma, right eye, severe stage: Secondary | ICD-10-CM | POA: Diagnosis not present

## 2019-03-22 DIAGNOSIS — H524 Presbyopia: Secondary | ICD-10-CM | POA: Diagnosis not present

## 2019-03-22 DIAGNOSIS — H401121 Primary open-angle glaucoma, left eye, mild stage: Secondary | ICD-10-CM | POA: Diagnosis not present

## 2019-03-22 DIAGNOSIS — H02132 Senile ectropion of right lower eyelid: Secondary | ICD-10-CM | POA: Diagnosis not present

## 2019-03-22 DIAGNOSIS — Z7984 Long term (current) use of oral hypoglycemic drugs: Secondary | ICD-10-CM | POA: Diagnosis not present

## 2019-03-22 DIAGNOSIS — Z961 Presence of intraocular lens: Secondary | ICD-10-CM | POA: Diagnosis not present

## 2019-03-23 ENCOUNTER — Other Ambulatory Visit: Payer: Self-pay | Admitting: Family Medicine

## 2019-03-23 DIAGNOSIS — E119 Type 2 diabetes mellitus without complications: Secondary | ICD-10-CM

## 2019-03-23 NOTE — Telephone Encounter (Signed)
.   Medication Request  03/23/2019  What is the name of the medication? metFORMIN (GLUCOPHAGE) 500 MG tablet/ Directions on bottle say to take 2 aday and Britney told her to take 3 a day and she is out  Have you contacted your pharmacy to request a refill? no  Which [facility] you [facility] sent to[facility]   Patient notified that their request is being sent to the clinical staff for review and that they should receive a call once it is complete. If they do not receive a call within 24 hours they can check with their pharmacy or our office.

## 2019-03-23 NOTE — Telephone Encounter (Signed)
Patient aware that pharmacy has rx on file and will fill for patient.

## 2019-04-02 DIAGNOSIS — H401121 Primary open-angle glaucoma, left eye, mild stage: Secondary | ICD-10-CM | POA: Diagnosis not present

## 2019-04-02 DIAGNOSIS — H44511 Absolute glaucoma, right eye: Secondary | ICD-10-CM | POA: Diagnosis not present

## 2019-04-08 NOTE — Progress Notes (Signed)
Assessment & Plan:  1. Type 2 diabetes mellitus without complication, without long-term current use of insulin (HCC) Lab Results  Component Value Date   HGBA1C 6.5 04/09/2019   HGBA1C 7.1 (H) 11/02/2018   HGBA1C 6.3 08/02/2018  - Diabetes is at goal of A1c < 7. - Medications: continue current medications - Home glucose monitoring: fasting and as needed - Patient is currently taking a statin. Patient is taking an ACE-inhibitor/ARB.  - Last foot exam: 08/02/2018 - Last diabetic eye exam: 09/27/2018 - Urine Microalbumin/Creat Ratio: Ordered today - CMP14+EGFR - Lipid panel - Bayer DCA Hb A1c Waived - Microalbumin / creatinine urine ratio - glipiZIDE (GLUCOTROL XL) 5 MG 24 hr tablet; Take 1 tablet (5 mg total) by mouth daily.  Dispense: 90 tablet; Refill: 1 - metFORMIN (GLUCOPHAGE) 500 MG tablet; Take 1 tablet (500 mg total) by mouth daily with breakfast AND 2 tablets (1,000 mg total) daily with supper.  Dispense: 270 tablet; Refill: 1  2. Difficulty sleeping - Patient may continue OTC Tylenol PM since it is effective.  3. Death of husband - Patient had a short course of clonazepam at home that I gave her after her husband's passing.  She reports she still has some as she did not take it very often.  She does not wish to have any medication to help with depression at this time as she feels she is going through a normal process.  4. Chronic diastolic CHF (congestive heart failure) ([place]) - Well controlled on current regimen.  - amLODipine (NORVASC) 10 MG tablet; Take 1 tablet (10 mg total) by mouth daily.  Dispense: 90 tablet; Refill: 1  5. Essential hypertension - Improving.  Adjustments and management by cardiology. - amLODipine (NORVASC) 10 MG tablet; Take 1 tablet (10 mg total) by mouth daily.  Dispense: 90 tablet; Refill: 1 - losartan (COZAAR) 100 MG tablet; Take 1 tablet (100 mg total) by mouth daily.  Dispense: 90 tablet; Refill: 2  6. 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  - pantoprazole (PROTONIX) 40 MG tablet; Take 1 tablet (40 mg total) by mouth daily.  Dispense: 90 tablet; Refill: 2  7. History of CVA in adulthood - clopidogrel (PLAVIX) 75 MG tablet; Take 1 tablet (75 mg total) by mouth daily.  Dispense: 90 tablet; Refill: 1  8. Anemia, unspecified type - CBC with Differential/Platelet   Return in about 3 months (around 07/09/2019) for follow-up of chronic medication conditions.  Hendricks Limes, MSN, APRN, FNP-C [facility]  Subjective:    Patient ID: Heather Oconnor, female    DOB: [birth year], 79 y.o.   MRN: [id number]  Patient Care Team: Loman Brooklyn, FNP as PCP - General (Family Medicine) Minus Breeding, MD as PCP - Cardiology (Cardiology) Gala Romney Cristopher Estimable, MD as Consulting Physician (Gastroenterology) Bond, Tracie Harrier, MD as Consulting Physician (Ophthalmology)   Chief Complaint:  Chief Complaint  Patient presents with  . Diabetes    3 month follow up    HPI: Heather Oconnor is a 79 y.o. female presenting on 04/09/2019 for Diabetes (3 month follow up)  Diabetes: Patient presents for follow up of diabetes. Current symptoms include: none. Known diabetic complications: cardiovascular disease and cerebrovascular disease. Medication compliance: yes. Current diet: in general, a "healthy" diet  . Current exercise: none. Home blood sugar records: BGs are running  consistent with Hgb A1C. Is she  on ACE inhibitor or angiotensin II receptor blocker? Yes. Is  she on a statin? No.   Lab Results  Component Value Date   HGBA1C 6.5 04/09/2019   HGBA1C 7.1 (H) 11/02/2018   HGBA1C 6.3 08/02/2018   Lab Results  Component Value Date   LDLCALC 85 04/09/2019   CREATININE 0.72 04/09/2019     New complaints: Patient reports she is having trouble sleeping at night and often wakes up in the middle of the night.  One of her children thinks that she needs  something to help with sleep, but patient states she is able to take over-the-counter Tylenol PM and she does fine going to sleep or even going back to sleep in the middle of the night.  She only takes one at a time.   Social history:  Relevant past medical, surgical, family and social history reviewed and updated as indicated. Interim medical history since our last visit reviewed.  Allergies and medications reviewed and updated.  DATA REVIEWED: CHART IN EPIC  ROS: Negative unless specifically indicated above in HPI.    Current Outpatient Medications:  .  acetaminophen (TYLENOL) 500 MG tablet, Take 2 tablets (1,000 mg total) by mouth every 6 (six) hours as needed., Disp: 12 tablet, Rfl: 0 .  amLODipine (NORVASC) 10 MG tablet, Take 1 tablet (10 mg total) by mouth daily., Disp: 90 tablet, Rfl: 1 .  aspirin EC 81 MG tablet, Take 1 tablet (81 mg total) by mouth daily with breakfast., Disp: 30 tablet, Rfl: 2 .  cloNIDine (CATAPRES) 0.2 MG tablet, Take 1 tablet (0.2 mg total) by mouth 2 (two) times daily., Disp: 180 tablet, Rfl: 3 .  clopidogrel (PLAVIX) 75 MG tablet, Take 1 tablet (75 mg total) by mouth daily., Disp: 90 tablet, Rfl: 1 .  glipiZIDE (GLUCOTROL XL) 5 MG 24 hr tablet, Take 1 tablet (5 mg total) by mouth daily., Disp: 90 tablet, Rfl: 1 .  hydrALAZINE (APRESOLINE) 50 MG tablet, Take 1 tablet (50 mg total) by mouth as directed. Take (1) tab in AM (2) tabs afternoon (2) tabs evening, Disp: 450 tablet, Rfl: 3 .  losartan (COZAAR) 100 MG tablet, Take 1 tablet (100 mg total) by mouth daily., Disp: 90 tablet, Rfl: 2 .  metFORMIN (GLUCOPHAGE) 500 MG tablet, Take 1 tablet (500 mg total) by mouth daily with breakfast AND 2 tablets (1,000 mg total) daily with supper., Disp: 270 tablet, Rfl: 1 .  pantoprazole (PROTONIX) 40 MG tablet, Take 1 tablet (40 mg total) by mouth daily., Disp: 90 tablet, Rfl: 2 .  spironolactone (ALDACTONE) 25 MG tablet, Take 1 tablet (25 mg total) by mouth daily., Disp:  90 tablet, Rfl: 3 .  timolol (TIMOPTIC) 0.5 % ophthalmic solution, Place 1 drop into both eyes daily., Disp: , Rfl:  .  atorvastatin (LIPITOR) 10 MG tablet, Take 1 tablet (10 mg total) by mouth daily at 6 PM., Disp: 90 tablet, Rfl: 2   Allergies  Allergen Reactions  . Hctz [Hydrochlorothiazide]     Per GI may have caused pancreatitis. Dr. Ardis Hughs recommended never resuming.  . Lisinopril     H/O pancreatitis.   Past Medical History:  Diagnosis Date  . Blind right eye   . Cholecystitis   . Cirrhosis ([place])   . Diabetes ([place])   . GERD (gastroesophageal reflux disease)   . Hemorrhoids   . Hypertension   . IBS (irritable bowel syndrome)   . Pancreatitis   . Transient cerebral ischemia 05/08/2008   Qualifier: Diagnosis of  By: Zeb Comfort      Past  Surgical History:  Procedure Laterality Date  . COLONOSCOPY  09/07/2007   RMR: anal canal/external hemorrhoids, redundant colon, left-sided diverticula, otherwise normal colonic mucosa    Social History   Socioeconomic History  . Marital status: Married    Spouse name: Not on file  . Number of children: Not on file  . Years of education: Not on file  . Highest education level: Not on file  Occupational History  . Not on file  Tobacco Use  . Smoking status: Former Smoker    Years: 9.00    Quit date: 11/27/2006    Years since quitting: 12.3  . Smokeless tobacco: Never Used  Substance and Sexual Activity  . Alcohol use: No    Alcohol/week: 0.0 standard drinks  . Drug use: No  . Sexual activity: Not Currently  Other Topics Concern  . Not on file  Social History Narrative   Lives with husband.  Three children.     Social Determinants of Health   Financial Resource Strain:   . Difficulty of Paying Living Expenses:   Food Insecurity:   . Worried About Charity fundraiser in the Last Year:   . Arboriculturist in the Last Year:   Transportation Needs:   . Film/video editor (Medical):   Marland Kitchen Lack of Transportation  (Non-Medical):   Physical Activity:   . Days of Exercise per Week:   . Minutes of Exercise per Session:   Stress:   . Feeling of Stress :   Social Connections:   . Frequency of Communication with Friends and Family:   . Frequency of Social Gatherings with Friends and Family:   . Attends Religious Services:   . Active Member of Clubs or Organizations:   . Attends Archivist Meetings:   Marland Kitchen Marital Status:   Intimate Partner Violence:   . Fear of Current or Ex-Partner:   . Emotionally Abused:   Marland Kitchen Physically Abused:   . Sexually Abused:         Objective:    BP (!) 177/79   Pulse 72   Temp 98 F (36.7 C) (Temporal)   Ht _0  (1.626 m)   Wt 157 lb (71.2 kg)   SpO2 97%   BMI 26.95 kg/m   Wt Readings from Last 3 Encounters:  04/09/19 157 lb (71.2 kg)  03/21/19 156 lb (70.8 kg)  02/28/19 156 lb (70.8 kg)    Physical Exam Vitals reviewed.  Constitutional:      General: She is not in acute distress.    Appearance: Normal appearance. She is overweight. She is not ill-appearing, toxic-appearing or diaphoretic.  HENT:     Head: Normocephalic and atraumatic.  Eyes:     General: No scleral icterus.       Right eye: No discharge.        Left eye: No discharge.     Conjunctiva/sclera: Conjunctivae normal.  Cardiovascular:     Rate and Rhythm: Normal rate and regular rhythm.     Heart sounds: Normal heart sounds. No murmur. No friction rub. No gallop.   Pulmonary:     Effort: Pulmonary effort is normal. No respiratory distress.     Breath sounds: Normal breath sounds. No stridor. No wheezing, rhonchi or rales.  Musculoskeletal:        General: Normal range of motion.     Cervical back: Normal range of motion.  Skin:    General: Skin is warm and dry.  Capillary Refill: Capillary refill takes less than 2 seconds.  Neurological:     General: No focal deficit present.     Mental Status: She is alert and oriented to person, place, and time. Mental status is at  baseline.  Psychiatric:        Mood and Affect: Mood normal.        Behavior: Behavior normal.        Thought Content: Thought content normal.        Judgment: Judgment normal.     Lab Results  Component Value Date   TSH 1.820 11/02/2018   Lab Results  Component Value Date   WBC 8.6 04/09/2019   HGB 14.1 04/09/2019   HCT 42.1 04/09/2019   MCV 86 04/09/2019   PLT 187 04/09/2019   Lab Results  Component Value Date   NA 141 04/09/2019   K 4.2 04/09/2019   CO2 27 04/09/2019   GLUCOSE 116 (H) 04/09/2019   BUN 10 04/09/2019   CREATININE 0.72 04/09/2019   BILITOT 0.4 04/09/2019   ALKPHOS 54 04/09/2019   AST 16 04/09/2019   ALT 11 04/09/2019   PROT 6.8 04/09/2019   ALBUMIN 4.5 04/09/2019   CALCIUM 9.9 04/09/2019   ANIONGAP 9 07/14/2018   Lab Results  Component Value Date   CHOL 168 04/09/2019   Lab Results  Component Value Date   HDL 31 (L) 04/09/2019   Lab Results  Component Value Date   LDLCALC 85 04/09/2019   Lab Results  Component Value Date   TRIG 315 (H) 04/09/2019   Lab Results  Component Value Date   CHOLHDL 5.4 (H) 04/09/2019   Lab Results  Component Value Date   HGBA1C 6.5 04/09/2019

## 2019-04-09 ENCOUNTER — Encounter: Payer: Self-pay | Admitting: Family Medicine

## 2019-04-09 ENCOUNTER — Ambulatory Visit (INDEPENDENT_AMBULATORY_CARE_PROVIDER_SITE_OTHER): Payer: Medicare HMO | Admitting: Family Medicine

## 2019-04-09 ENCOUNTER — Other Ambulatory Visit: Payer: Self-pay

## 2019-04-09 VITALS — BP 177/79 | HR 72 | Temp 98.0°F | Ht 64.0 in | Wt 157.0 lb

## 2019-04-09 DIAGNOSIS — G479 Sleep disorder, unspecified: Secondary | ICD-10-CM

## 2019-04-09 DIAGNOSIS — D649 Anemia, unspecified: Secondary | ICD-10-CM

## 2019-04-09 DIAGNOSIS — Z634 Disappearance and death of family member: Secondary | ICD-10-CM

## 2019-04-09 DIAGNOSIS — Z8673 Personal history of transient ischemic attack (TIA), and cerebral infarction without residual deficits: Secondary | ICD-10-CM

## 2019-04-09 DIAGNOSIS — I5032 Chronic diastolic (congestive) heart failure: Secondary | ICD-10-CM | POA: Diagnosis not present

## 2019-04-09 DIAGNOSIS — I10 Essential (primary) hypertension: Secondary | ICD-10-CM

## 2019-04-09 DIAGNOSIS — E119 Type 2 diabetes mellitus without complications: Secondary | ICD-10-CM

## 2019-04-09 DIAGNOSIS — K2100 Gastro-esophageal reflux disease with esophagitis, without bleeding: Secondary | ICD-10-CM | POA: Diagnosis not present

## 2019-04-09 LAB — BAYER DCA HB A1C WAIVED: HB A1C (BAYER DCA - WAIVED): 6.5 % (ref <7.0)

## 2019-04-09 MED ORDER — METFORMIN HCL 500 MG PO TABS: ORAL_TABLET | ORAL | 1 refills | Status: DC

## 2019-04-09 MED ORDER — LOSARTAN POTASSIUM 100 MG PO TABS: 100.0000 mg | ORAL_TABLET | Freq: Every day | ORAL | 2 refills | Status: DC

## 2019-04-09 MED ORDER — AMLODIPINE BESYLATE 10 MG PO TABS: 10.0000 mg | ORAL_TABLET | Freq: Every day | ORAL | 1 refills | Status: DC

## 2019-04-09 MED ORDER — ATORVASTATIN CALCIUM 10 MG PO TABS: 10.0000 mg | ORAL_TABLET | Freq: Every day | ORAL | 2 refills | Status: DC

## 2019-04-09 MED ORDER — GLIPIZIDE ER 5 MG PO TB24: 5.0000 mg | ORAL_TABLET | Freq: Every day | ORAL | 1 refills | Status: DC

## 2019-04-09 MED ORDER — CLOPIDOGREL BISULFATE 75 MG PO TABS: 75.0000 mg | ORAL_TABLET | Freq: Every day | ORAL | 1 refills | Status: DC

## 2019-04-09 MED ORDER — PANTOPRAZOLE SODIUM 40 MG PO TBEC: 40.0000 mg | DELAYED_RELEASE_TABLET | Freq: Every day | ORAL | 2 refills | Status: DC

## 2019-04-10 LAB — CBC WITH DIFFERENTIAL/PLATELET
Basophils Absolute: 0.1 10*3/uL (ref 0.0–0.2)
Basos: 1 %
EOS (ABSOLUTE): 0.3 10*3/uL (ref 0.0–0.4)
Eos: 3 %
Hematocrit: 42.1 % (ref 34.0–46.6)
Hemoglobin: 14.1 g/dL (ref 11.1–15.9)
Immature Grans (Abs): 0 10*3/uL (ref 0.0–0.1)
Immature Granulocytes: 1 %
Lymphocytes Absolute: 1.7 10*3/uL (ref 0.7–3.1)
Lymphs: 20 %
MCH: 28.8 pg (ref 26.6–33.0)
MCHC: 33.5 g/dL (ref 31.5–35.7)
MCV: 86 fL (ref 79–97)
Monocytes Absolute: 0.6 10*3/uL (ref 0.1–0.9)
Monocytes: 7 %
Neutrophils Absolute: 5.9 10*3/uL (ref 1.4–7.0)
Neutrophils: 68 %
Platelets: 187 10*3/uL (ref 150–450)
RBC: 4.9 x10E6/uL (ref 3.77–5.28)
RDW: 14.5 % (ref 11.7–15.4)
WBC: 8.6 10*3/uL (ref 3.4–10.8)

## 2019-04-10 LAB — CMP14+EGFR
ALT: 11 IU/L (ref 0–32)
AST: 16 IU/L (ref 0–40)
Albumin/Globulin Ratio: 2 (ref 1.2–2.2)
Albumin: 4.5 g/dL (ref 3.7–4.7)
Alkaline Phosphatase: 54 IU/L (ref 39–117)
BUN/Creatinine Ratio: 14 (ref 12–28)
BUN: 10 mg/dL (ref 8–27)
Bilirubin Total: 0.4 mg/dL (ref 0.0–1.2)
CO2: 27 mmol/L (ref 20–29)
Calcium: 9.9 mg/dL (ref 8.7–10.3)
Chloride: 102 mmol/L (ref 96–106)
Creatinine, Ser: 0.72 mg/dL (ref 0.57–1.00)
GFR calc Af Amer: 93 mL/min/{1.73_m2} (ref >59)
GFR calc non Af Amer: 80 mL/min/{1.73_m2} (ref >59)
Globulin, Total: 2.3 g/dL (ref 1.5–4.5)
Glucose: 116 mg/dL — ABNORMAL HIGH (ref 65–99)
Potassium: 4.2 mmol/L (ref 3.5–5.2)
Sodium: 141 mmol/L (ref 134–144)
Total Protein: 6.8 g/dL (ref 6.0–8.5)

## 2019-04-10 LAB — LIPID PANEL
Chol/HDL Ratio: 5.4 ratio — ABNORMAL HIGH (ref 0.0–4.4)
Cholesterol, Total: 168 mg/dL (ref 100–199)
HDL: 31 mg/dL — ABNORMAL LOW (ref >39)
LDL Chol Calc (NIH): 85 mg/dL (ref 0–99)
Triglycerides: 315 mg/dL — ABNORMAL HIGH (ref 0–149)
VLDL Cholesterol Cal: 52 mg/dL — ABNORMAL HIGH (ref 5–40)

## 2019-04-10 LAB — MICROALBUMIN / CREATININE URINE RATIO
Creatinine, Urine: 45.6 mg/dL
Microalb/Creat Ratio: 91 mg/g creat — ABNORMAL HIGH (ref 0–29)
Microalbumin, Urine: 41.4 ug/mL

## 2019-04-11 ENCOUNTER — Encounter: Payer: Self-pay | Admitting: *Deleted

## 2019-04-16 ENCOUNTER — Encounter: Payer: Self-pay | Admitting: Family Medicine

## 2019-04-25 DIAGNOSIS — Z79899 Other long term (current) drug therapy: Secondary | ICD-10-CM | POA: Diagnosis not present

## 2019-04-25 DIAGNOSIS — H401413 Capsular glaucoma with pseudoexfoliation of lens, right eye, severe stage: Secondary | ICD-10-CM | POA: Diagnosis not present

## 2019-04-25 DIAGNOSIS — H401121 Primary open-angle glaucoma, left eye, mild stage: Secondary | ICD-10-CM | POA: Diagnosis not present

## 2019-05-07 DIAGNOSIS — H401121 Primary open-angle glaucoma, left eye, mild stage: Secondary | ICD-10-CM | POA: Diagnosis not present

## 2019-05-07 DIAGNOSIS — H401113 Primary open-angle glaucoma, right eye, severe stage: Secondary | ICD-10-CM | POA: Diagnosis not present

## 2019-05-07 DIAGNOSIS — H16223 Keratoconjunctivitis sicca, not specified as Sjogren's, bilateral: Secondary | ICD-10-CM | POA: Diagnosis not present

## 2019-05-21 DIAGNOSIS — H16223 Keratoconjunctivitis sicca, not specified as Sjogren's, bilateral: Secondary | ICD-10-CM | POA: Diagnosis not present

## 2019-05-21 DIAGNOSIS — H401113 Primary open-angle glaucoma, right eye, severe stage: Secondary | ICD-10-CM | POA: Diagnosis not present

## 2019-05-21 DIAGNOSIS — H401121 Primary open-angle glaucoma, left eye, mild stage: Secondary | ICD-10-CM | POA: Diagnosis not present

## 2019-06-05 DIAGNOSIS — H401121 Primary open-angle glaucoma, left eye, mild stage: Secondary | ICD-10-CM | POA: Diagnosis not present

## 2019-06-05 DIAGNOSIS — H401113 Primary open-angle glaucoma, right eye, severe stage: Secondary | ICD-10-CM | POA: Diagnosis not present

## 2019-07-03 ENCOUNTER — Other Ambulatory Visit: Payer: Self-pay | Admitting: Cardiology

## 2019-07-10 ENCOUNTER — Ambulatory Visit (INDEPENDENT_AMBULATORY_CARE_PROVIDER_SITE_OTHER): Payer: Medicare HMO | Admitting: Family Medicine

## 2019-07-10 ENCOUNTER — Other Ambulatory Visit: Payer: Self-pay

## 2019-07-10 ENCOUNTER — Encounter: Payer: Self-pay | Admitting: Family Medicine

## 2019-07-10 VITALS — BP 162/77 | HR 69 | Temp 97.6°F | Ht 64.0 in | Wt 155.4 lb

## 2019-07-10 DIAGNOSIS — F321 Major depressive disorder, single episode, moderate: Secondary | ICD-10-CM

## 2019-07-10 DIAGNOSIS — E119 Type 2 diabetes mellitus without complications: Secondary | ICD-10-CM

## 2019-07-10 DIAGNOSIS — Z01812 Encounter for preprocedural laboratory examination: Secondary | ICD-10-CM

## 2019-07-10 DIAGNOSIS — E782 Mixed hyperlipidemia: Secondary | ICD-10-CM | POA: Diagnosis not present

## 2019-07-10 DIAGNOSIS — J439 Emphysema, unspecified: Secondary | ICD-10-CM | POA: Diagnosis not present

## 2019-07-10 DIAGNOSIS — G479 Sleep disorder, unspecified: Secondary | ICD-10-CM

## 2019-07-10 DIAGNOSIS — E538 Deficiency of other specified B group vitamins: Secondary | ICD-10-CM

## 2019-07-10 DIAGNOSIS — I10 Essential (primary) hypertension: Secondary | ICD-10-CM | POA: Diagnosis not present

## 2019-07-10 DIAGNOSIS — K2100 Gastro-esophageal reflux disease with esophagitis, without bleeding: Secondary | ICD-10-CM

## 2019-07-10 DIAGNOSIS — Z20822 Contact with and (suspected) exposure to covid-19: Secondary | ICD-10-CM | POA: Diagnosis not present

## 2019-07-10 DIAGNOSIS — K7460 Unspecified cirrhosis of liver: Secondary | ICD-10-CM | POA: Diagnosis not present

## 2019-07-10 DIAGNOSIS — I5032 Chronic diastolic (congestive) heart failure: Secondary | ICD-10-CM | POA: Diagnosis not present

## 2019-07-10 LAB — BAYER DCA HB A1C WAIVED: HB A1C (BAYER DCA - WAIVED): 6.1 % (ref <7.0)

## 2019-07-10 MED ORDER — MIRTAZAPINE 7.5 MG PO TABS: 7.5000 mg | ORAL_TABLET | Freq: Every day | ORAL | 2 refills | Status: DC

## 2019-07-10 NOTE — Progress Notes (Signed)
Assessment & Plan:  1. Type 2 diabetes mellitus without complication, without long-term current use of insulin (HCC) -  Lab Results  Component Value Date   HGBA1C 6.1 07/10/2019   HGBA1C 6.5 04/09/2019   HGBA1C 7.1 (H) 11/02/2018  - Diabetes is at goal of A1c < 7. - Medications: continue current medications - Home glucose monitoring: continue monitoring - Patient is currently taking a statin. Patient is taking an ACE-inhibitor/ARB.  - Last foot exam: 08/02/2018 - Last diabetic eye exam: 09/27/2018 - Urine Microalbumin/Creat Ratio: 04/09/2019 - Bayer DCA Hb A1c Waived - CMP14+EGFR  2. Moderate major depression ([place]) - Started patient on Remeron today. - mirtazapine (REMERON) 7.5 MG tablet; Take 1 tablet (7.5 mg total) by mouth at bedtime.  Dispense: 30 tablet; Refill: 2  3. Difficulty sleeping - Started patient on Remeron today. - mirtazapine (REMERON) 7.5 MG tablet; Take 1 tablet (7.5 mg total) by mouth at bedtime.  Dispense: 30 tablet; Refill: 2  4. B12 deficiency - Vitamin B12  5. Essential hypertension - Well controlled on current regimen.   6. Mixed hyperlipidemia - Well controlled on current regimen.   7. Chronic diastolic CHF (congestive heart failure) ([place]) - Well controlled on current regimen.   8. Pulmonary emphysema, unspecified emphysema type ([place]) - Well controlled on current regimen.   9. Gastroesophageal reflux disease with esophagitis, unspecified whether hemorrhage -. bjcon  10. Encounter for screening laboratory testing for COVID-19 virus in asymptomatic patient - SARS-CoV-2 Antibodies   Return in about 6 weeks (around 08/21/2019) for depression (telephone).  Hendricks Limes, MSN, APRN, FNP-C [facility]  Subjective:    Patient ID: Heather Oconnor, female    DOB: 1940-08-10, 79 y.o.   MRN: [id number]  Patient Care Team: Loman Brooklyn, FNP as PCP - General (Family Medicine) Minus Breeding, MD as PCP - Cardiology  (Cardiology) Gala Romney Cristopher Estimable, MD as Consulting Physician (Gastroenterology) Bond, Tracie Harrier, MD as Consulting Physician (Ophthalmology)   Chief Complaint:  Chief Complaint  Patient presents with  . Diabetes    3 month follow up  . Depression    Since husband passed away in 02-28-22.    HPI: Heather Oconnor is a 79 y.o. female presenting on 07/10/2019 for Diabetes (3 month follow up) and Depression (Since husband passed away in 28-Feb-2022.)  Diabetes: Patient presents for follow up of diabetes. Current symptoms include: none. Known diabetic complications: cardiovascular disease and cerebrovascular disease. Medication compliance: yes. Current diet: in general, a "healthy" diet  . Current exercise: none. Home blood sugar records: BGs are running  consistent with Hgb A1C. Is she  on ACE inhibitor or angiotensin II receptor blocker? Yes. Is she on a statin? Yes.   Lab Results  Component Value Date   HGBA1C 6.1 07/10/2019   HGBA1C 6.5 04/09/2019   HGBA1C 7.1 (H) 11/02/2018   Lab Results  Component Value Date   LDLCALC 85 04/09/2019   CREATININE 0.72 04/09/2019     Patient is struggling with the death of her husband. She feels she is getting worse instead of better. She has no trouble falling asleep, but states she only sleeps for 2 hours and then she is up again. She feels she is having a hard time adjusting to her new life without him.   Depression screen [facility] 2/9 07/10/2019 12/13/2018 11/02/2018  Decreased Interest 1 0 0  Down, Depressed, Hopeless 1 0 0  PHQ - 2 Score 2 0 0  Altered sleeping 3 -  0  Tired, decreased energy 3 - 0  Change in appetite 1 - 0  Feeling bad or failure about yourself  0 - 0  Trouble concentrating 0 - 0  Moving slowly or fidgety/restless 0 - 0  Suicidal thoughts 0 - 0  PHQ-9 Score 9 - 0   GAD 7 : Generalized Anxiety Score 07/10/2019  Nervous, Anxious, on Edge 0  Control/stop worrying 0  Worry too much - different things 1  Trouble relaxing 0  Restless 0   Easily annoyed or irritable 0  Afraid - awful might happen 1  Total GAD 7 Score 2    Patient reports BP at home is never >960 systolic unless she is upset like she is right now. She has seen it as low as 112. She is checking her BP twice daily.    New complaints: Requesting COVID-19 antibody testing today.   Social history:  Relevant past medical, surgical, family and social history reviewed and updated as indicated. Interim medical history since our last visit reviewed.  Allergies and medications reviewed and updated.  DATA REVIEWED: CHART IN EPIC  ROS: Negative unless specifically indicated above in HPI.    Current Outpatient Medications:  .  acetaminophen (TYLENOL) 500 MG tablet, Take 2 tablets (1,000 mg total) by mouth every 6 (six) hours as needed., Disp: 12 tablet, Rfl: 0 .  amLODipine (NORVASC) 10 MG tablet, Take 1 tablet (10 mg total) by mouth daily., Disp: 90 tablet, Rfl: 1 .  atorvastatin (LIPITOR) 10 MG tablet, Take 1 tablet (10 mg total) by mouth daily at 6 PM., Disp: 90 tablet, Rfl: 2 .  cloNIDine (CATAPRES) 0.2 MG tablet, Take 1 tablet (0.2 mg total) by mouth 2 (two) times daily., Disp: 180 tablet, Rfl: 3 .  clopidogrel (PLAVIX) 75 MG tablet, Take 1 tablet (75 mg total) by mouth daily., Disp: 90 tablet, Rfl: 1 .  glipiZIDE (GLUCOTROL XL) 5 MG 24 hr tablet, Take 1 tablet (5 mg total) by mouth daily., Disp: 90 tablet, Rfl: 1 .  hydrALAZINE (APRESOLINE) 50 MG tablet, Take 1 tablet (50 mg total) by mouth as directed. Take (1) tab in AM (2) tabs afternoon (2) tabs evening, Disp: 450 tablet, Rfl: 3 .  losartan (COZAAR) 100 MG tablet, Take 1 tablet (100 mg total) by mouth daily., Disp: 90 tablet, Rfl: 2 .  metFORMIN (GLUCOPHAGE) 500 MG tablet, Take 1 tablet (500 mg total) by mouth daily with breakfast AND 2 tablets (1,000 mg total) daily with supper., Disp: 270 tablet, Rfl: 1 .  pantoprazole (PROTONIX) 40 MG tablet, Take 1 tablet (40 mg total) by mouth daily., Disp: 90  tablet, Rfl: 2 .  spironolactone (ALDACTONE) 25 MG tablet, TAKE 1 TABLET EVERY DAY, Disp: 90 tablet, Rfl: 1 .  timolol (TIMOPTIC) 0.5 % ophthalmic solution, Place 1 drop into both eyes daily., Disp: , Rfl:    Allergies  Allergen Reactions  . Hctz [Hydrochlorothiazide]     Per GI may have caused pancreatitis. Dr. Ardis Hughs recommended never resuming.  . Lisinopril     H/O pancreatitis.   Past Medical History:  Diagnosis Date  . Blind right eye   . Cholecystitis   . Cirrhosis ([place])   . Diabetes ([place])   . GERD (gastroesophageal reflux disease)   . Hemorrhoids   . Hypertension   . IBS (irritable bowel syndrome)   . Pancreatitis   . Transient cerebral ischemia 05/08/2008   Qualifier: Diagnosis of  By: Zeb Comfort      Past  Surgical History:  Procedure Laterality Date  . COLONOSCOPY  09/07/2007   RMR: anal canal/external hemorrhoids, redundant colon, left-sided diverticula, otherwise normal colonic mucosa    Social History   Socioeconomic History  . Marital status: Married    Spouse name: Not on file  . Number of children: Not on file  . Years of education: Not on file  . Highest education level: Not on file  Occupational History  . Not on file  Tobacco Use  . Smoking status: Former Smoker    Years: 9.00    Quit date: 11/27/2006    Years since quitting: 12.6  . Smokeless tobacco: Never Used  Vaping Use  . Vaping Use: Never used  Substance and Sexual Activity  . Alcohol use: No    Alcohol/week: 0.0 standard drinks  . Drug use: No  . Sexual activity: Not Currently  Other Topics Concern  . Not on file  Social History Narrative   Lives with husband.  Three children.     Social Determinants of Health   Financial Resource Strain:   . Difficulty of Paying Living Expenses:   Food Insecurity:   . Worried About Charity fundraiser in the Last Year:   . Arboriculturist in the Last Year:   Transportation Needs:   . Film/video editor (Medical):   Marland Kitchen Lack of  Transportation (Non-Medical):   Physical Activity:   . Days of Exercise per Week:   . Minutes of Exercise per Session:   Stress:   . Feeling of Stress :   Social Connections:   . Frequency of Communication with Friends and Family:   . Frequency of Social Gatherings with Friends and Family:   . Attends Religious Services:   . Active Member of Clubs or Organizations:   . Attends Archivist Meetings:   Marland Kitchen Marital Status:   Intimate Partner Violence:   . Fear of Current or Ex-Partner:   . Emotionally Abused:   Marland Kitchen Physically Abused:   . Sexually Abused:         Objective:    BP (!) 162/77   Pulse 69   Temp 97.6 F (36.4 C) (Temporal)   Ht '5\' 4"'  (1.626 m)   Wt 155 lb 6.4 oz (70.5 kg)   SpO2 96%   BMI 26.67 kg/m   Wt Readings from Last 3 Encounters:  07/10/19 155 lb 6.4 oz (70.5 kg)  04/09/19 157 lb (71.2 kg)  03/21/19 156 lb (70.8 kg)    Physical Exam Vitals reviewed.  Constitutional:      General: She is not in acute distress.    Appearance: Normal appearance. She is overweight. She is not ill-appearing, toxic-appearing or diaphoretic.  HENT:     Head: Normocephalic and atraumatic.  Eyes:     General: No scleral icterus.       Right eye: No discharge.        Left eye: No discharge.     Conjunctiva/sclera: Conjunctivae normal.  Cardiovascular:     Rate and Rhythm: Normal rate and regular rhythm.     Heart sounds: Normal heart sounds. No murmur heard.  No friction rub. No gallop.   Pulmonary:     Effort: Pulmonary effort is normal. No respiratory distress.     Breath sounds: Normal breath sounds. No stridor. No wheezing, rhonchi or rales.  Musculoskeletal:        General: Normal range of motion.     Cervical back: Normal range of  motion.  Skin:    General: Skin is warm and dry.     Capillary Refill: Capillary refill takes less than 2 seconds.  Neurological:     General: No focal deficit present.     Mental Status: She is alert and oriented to person,  place, and time. Mental status is at baseline.  Psychiatric:        Mood and Affect: Mood is depressed. Affect is tearful.        Behavior: Behavior normal.        Thought Content: Thought content normal.        Judgment: Judgment normal.     Lab Results  Component Value Date   TSH 1.820 11/02/2018   Lab Results  Component Value Date   WBC 8.6 04/09/2019   HGB 14.1 04/09/2019   HCT 42.1 04/09/2019   MCV 86 04/09/2019   PLT 187 04/09/2019   Lab Results  Component Value Date   NA 141 04/09/2019   K 4.2 04/09/2019   CO2 27 04/09/2019   GLUCOSE 116 (H) 04/09/2019   BUN 10 04/09/2019   CREATININE 0.72 04/09/2019   BILITOT 0.4 04/09/2019   ALKPHOS 54 04/09/2019   AST 16 04/09/2019   ALT 11 04/09/2019   PROT 6.8 04/09/2019   ALBUMIN 4.5 04/09/2019   CALCIUM 9.9 04/09/2019   ANIONGAP 9 07/14/2018   Lab Results  Component Value Date   CHOL 168 04/09/2019   Lab Results  Component Value Date   HDL 31 (L) 04/09/2019   Lab Results  Component Value Date   LDLCALC 85 04/09/2019   Lab Results  Component Value Date   TRIG 315 (H) 04/09/2019   Lab Results  Component Value Date   CHOLHDL 5.4 (H) 04/09/2019   Lab Results  Component Value Date   HGBA1C 6.1 07/10/2019

## 2019-07-11 LAB — CMP14+EGFR
ALT: 12 IU/L (ref 0–32)
AST: 15 IU/L (ref 0–40)
Albumin/Globulin Ratio: 2 (ref 1.2–2.2)
Albumin: 4.9 g/dL — ABNORMAL HIGH (ref 3.7–4.7)
Alkaline Phosphatase: 50 IU/L (ref 48–121)
BUN/Creatinine Ratio: 11 — ABNORMAL LOW (ref 12–28)
BUN: 8 mg/dL (ref 8–27)
Bilirubin Total: 0.4 mg/dL (ref 0.0–1.2)
CO2: 25 mmol/L (ref 20–29)
Calcium: 10.5 mg/dL — ABNORMAL HIGH (ref 8.7–10.3)
Chloride: 100 mmol/L (ref 96–106)
Creatinine, Ser: 0.74 mg/dL (ref 0.57–1.00)
GFR calc Af Amer: 90 mL/min/{1.73_m2} (ref >59)
GFR calc non Af Amer: 78 mL/min/{1.73_m2} (ref >59)
Globulin, Total: 2.4 g/dL (ref 1.5–4.5)
Glucose: 99 mg/dL (ref 65–99)
Potassium: 4.7 mmol/L (ref 3.5–5.2)
Sodium: 140 mmol/L (ref 134–144)
Total Protein: 7.3 g/dL (ref 6.0–8.5)

## 2019-07-11 LAB — VITAMIN B12: Vitamin B-12: 203 pg/mL — ABNORMAL LOW (ref 232–1245)

## 2019-07-11 LAB — SARS-COV-2 ANTIBODIES: SARS-CoV-2 Antibodies: POSITIVE

## 2019-07-21 DIAGNOSIS — H401121 Primary open-angle glaucoma, left eye, mild stage: Secondary | ICD-10-CM | POA: Diagnosis not present

## 2019-07-21 DIAGNOSIS — H44511 Absolute glaucoma, right eye: Secondary | ICD-10-CM | POA: Diagnosis not present

## 2019-07-27 DIAGNOSIS — H40052 Ocular hypertension, left eye: Secondary | ICD-10-CM | POA: Diagnosis not present

## 2019-07-27 DIAGNOSIS — H44511 Absolute glaucoma, right eye: Secondary | ICD-10-CM | POA: Diagnosis not present

## 2019-08-22 ENCOUNTER — Ambulatory Visit (INDEPENDENT_AMBULATORY_CARE_PROVIDER_SITE_OTHER): Payer: Medicare HMO | Admitting: Family Medicine

## 2019-08-22 ENCOUNTER — Telehealth: Payer: Self-pay | Admitting: Family Medicine

## 2019-08-22 ENCOUNTER — Encounter: Payer: Self-pay | Admitting: Family Medicine

## 2019-08-22 DIAGNOSIS — F321 Major depressive disorder, single episode, moderate: Secondary | ICD-10-CM | POA: Diagnosis not present

## 2019-08-22 DIAGNOSIS — G479 Sleep disorder, unspecified: Secondary | ICD-10-CM

## 2019-08-22 MED ORDER — MIRTAZAPINE 15 MG PO TABS: 15.0000 mg | ORAL_TABLET | Freq: Every day | ORAL | 2 refills | Status: DC

## 2019-08-22 MED ORDER — MIRTAZAPINE 7.5 MG PO TABS: 7.5000 mg | ORAL_TABLET | Freq: Every day | ORAL | 1 refills | Status: DC

## 2019-08-22 NOTE — Progress Notes (Signed)
Virtual Visit via Telephone Note  I connected with Heather Oconnor on 08/22/19 at 8:08 AM by telephone and verified that I am speaking with the correct person using two identifiers. Heather Oconnor is currently located at home and nobody is currently with her during this visit. The provider, Gwenlyn Fudge, FNP is located in their office at time of visit.  I discussed the limitations, risks, security and privacy concerns of performing an evaluation and management service by telephone and the availability of in person appointments. I also discussed with the patient that there may be a patient responsible charge related to this service. The patient expressed understanding and agreed to proceed.  Subjective: PCP: Gwenlyn Fudge, FNP  Chief Complaint  Patient presents with  . Depression   Patient is following up on depression and sleep. She reports she is sleeping better. She still feels she just doesn't have the will to do anything. Her deceased husband's shoes are still where he left them, as well as his bathroom stuff. She just can't bring herself to move his stuff. She does have a supportive family and church friends. She has not done any grief counseling.   ROS: Per HPI  Current Outpatient Medications:  .  dorzolamide-timolol (COSOPT) 22.3-6.8 MG/ML ophthalmic solution, Place 1 drop into both eyes 2 (two) times daily., Disp: , Rfl:  .  acetaminophen (TYLENOL) 500 MG tablet, Take 2 tablets (1,000 mg total) by mouth every 6 (six) hours as needed., Disp: 12 tablet, Rfl: 0 .  amLODipine (NORVASC) 10 MG tablet, Take 1 tablet (10 mg total) by mouth daily., Disp: 90 tablet, Rfl: 1 .  atorvastatin (LIPITOR) 10 MG tablet, Take 1 tablet (10 mg total) by mouth daily at 6 PM., Disp: 90 tablet, Rfl: 2 .  cloNIDine (CATAPRES) 0.2 MG tablet, Take 1 tablet (0.2 mg total) by mouth 2 (two) times daily., Disp: 180 tablet, Rfl: 3 .  clopidogrel (PLAVIX) 75 MG tablet, Take 1 tablet (75 mg total) by  mouth daily., Disp: 90 tablet, Rfl: 1 .  glipiZIDE (GLUCOTROL XL) 5 MG 24 hr tablet, Take 1 tablet (5 mg total) by mouth daily., Disp: 90 tablet, Rfl: 1 .  hydrALAZINE (APRESOLINE) 50 MG tablet, Take 1 tablet (50 mg total) by mouth as directed. Take (1) tab in AM (2) tabs afternoon (2) tabs evening, Disp: 450 tablet, Rfl: 3 .  losartan (COZAAR) 100 MG tablet, Take 1 tablet (100 mg total) by mouth daily., Disp: 90 tablet, Rfl: 2 .  metFORMIN (GLUCOPHAGE) 500 MG tablet, Take 1 tablet (500 mg total) by mouth daily with breakfast AND 2 tablets (1,000 mg total) daily with supper., Disp: 270 tablet, Rfl: 1 .  mirtazapine (REMERON) 7.5 MG tablet, Take 1 tablet (7.5 mg total) by mouth at bedtime., Disp: 30 tablet, Rfl: 2 .  pantoprazole (PROTONIX) 40 MG tablet, Take 1 tablet (40 mg total) by mouth daily., Disp: 90 tablet, Rfl: 2 .  spironolactone (ALDACTONE) 25 MG tablet, TAKE 1 TABLET EVERY DAY, Disp: 90 tablet, Rfl: 1 .  timolol (TIMOPTIC) 0.5 % ophthalmic solution, Place 1 drop into both eyes daily., Disp: , Rfl:   Allergies  Allergen Reactions  . Hctz [Hydrochlorothiazide]     Per GI may have caused pancreatitis. Dr. Christella Hartigan recommended never resuming.  . Lisinopril     H/O pancreatitis.   Past Medical History:  Diagnosis Date  . Blind right eye   . Cholecystitis   . Cirrhosis (HCC)   . Diabetes (  HCC)   . GERD (gastroesophageal reflux disease)   . Hemorrhoids   . Hypertension   . IBS (irritable bowel syndrome)   . Pancreatitis   . Transient cerebral ischemia 05/08/2008   Qualifier: Diagnosis of  By: Diana Eves      Observations/Objective: A&O  No respiratory distress or wheezing audible over the phone Mood, judgement, and thought processes all WNL   Assessment and Plan: 1. Moderate major depression (HCC) - Patient does not desire a dosage increase. Feels like this is just going to take time. Encouraged to participate in grief counseling. - mirtazapine (REMERON) 7.5 MG tablet;  Take 1 tablet (7.5 mg total) by mouth at bedtime.  Dispense: 90 tablet; Refill: 1  2. Difficulty sleeping - Well controlled on current regimen.  - mirtazapine (REMERON) 7.5 MG tablet; Take 1 tablet (7.5 mg total) by mouth at bedtime.  Dispense: 90 tablet; Refill: 1   Follow Up Instructions: Return in about 4 months (around 12/22/2019) for follow-up of chronic medication conditions.  I discussed the assessment and treatment plan with the patient. The patient was provided an opportunity to ask questions and all were answered. The patient agreed with the plan and demonstrated an understanding of the instructions.   The patient was advised to call back or seek an in-person evaluation if the symptoms worsen or if the condition fails to improve as anticipated.  The above assessment and management plan was discussed with the patient. The patient verbalized understanding of and has agreed to the management plan. Patient is aware to call the clinic if symptoms persist or worsen. Patient is aware when to return to the clinic for a follow-up visit. Patient educated on when it is appropriate to go to the emergency department.   Time call ended: 8:19 AM  I provided 13 minutes of non-face-to-face time during this encounter.  Deliah Boston, MSN, APRN, FNP-C [facility] 08/22/19

## 2019-08-22 NOTE — Telephone Encounter (Signed)
Pt aware of provider feedback and voiced understanding. 

## 2019-08-22 NOTE — Telephone Encounter (Signed)
I increased mirtazapine to 15 mg at bedtime.  Please advise patient she can take 2 of the tablets she currently has until she gets her new prescription and then she will only need one as it will be the 15 mg tablet.

## 2019-09-16 IMAGING — CT CT ABDOMEN AND PELVIS WITH CONTRAST
2 of 5 series · 16 of 46 positions shown, 18 images · IV contrast (Omni 300)
Comparison: CT scan of June 12, 2018.

CLINICAL DATA: Pancreatitis.  Acute generalized abdominal pain.

EXAM:
CT ABDOMEN AND PELVIS WITH CONTRAST
TECHNIQUE: Multidetector CT imaging of the abdomen and pelvis was performed
using the standard protocol following bolus administration of
intravenous contrast.
CONTRAST:  100mL OMNIPAQUE IOHEXOL 300 MG/ML  SOLN

[Series 3: a/p w/ 5mm · axial · 0.90mm/px · z∈[+783,+1228]mm · 13 of 99 slices shown, 15 images]
[im 5/99  soft-tissue]
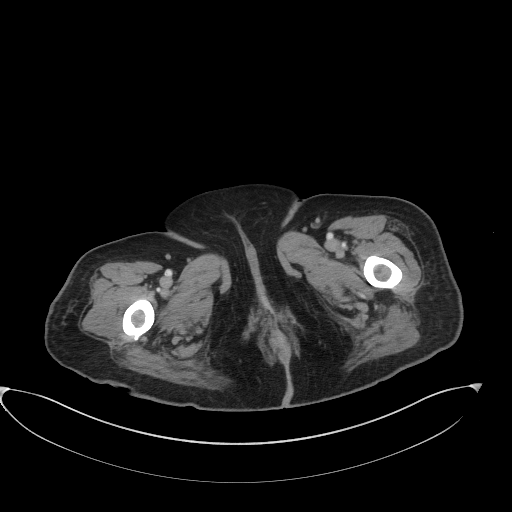
[im 5/99  bone]
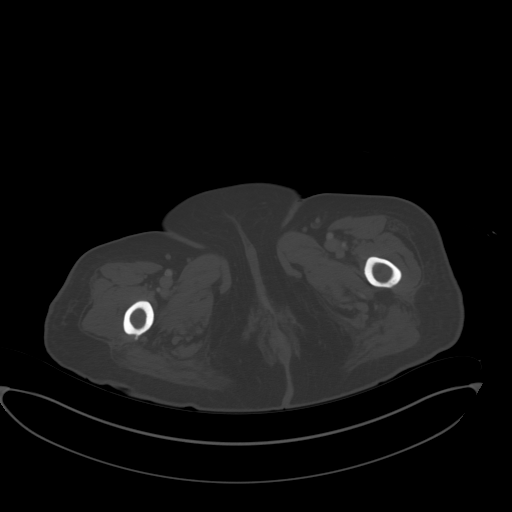
[im 15/99  soft-tissue]
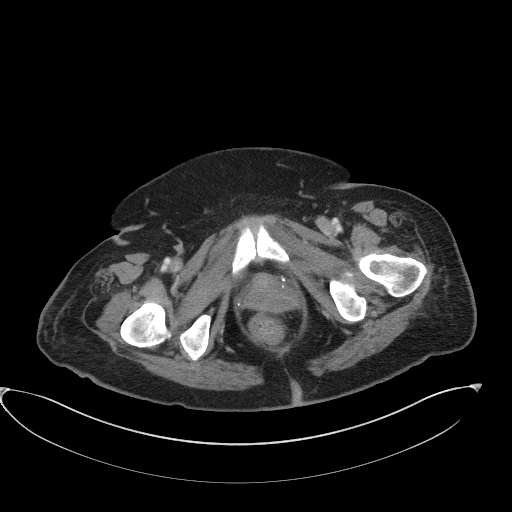
[im 20/99  soft-tissue]
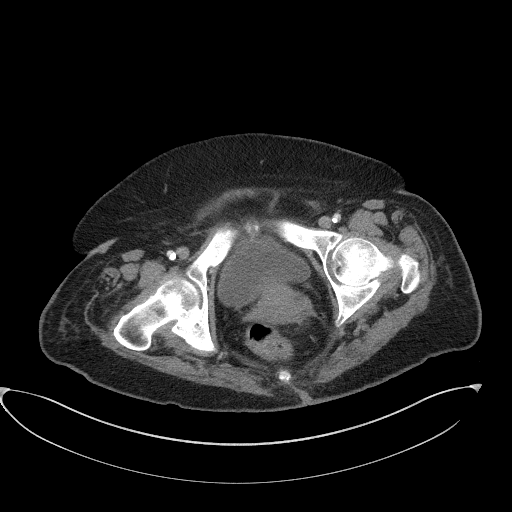
[im 30/99  soft-tissue]
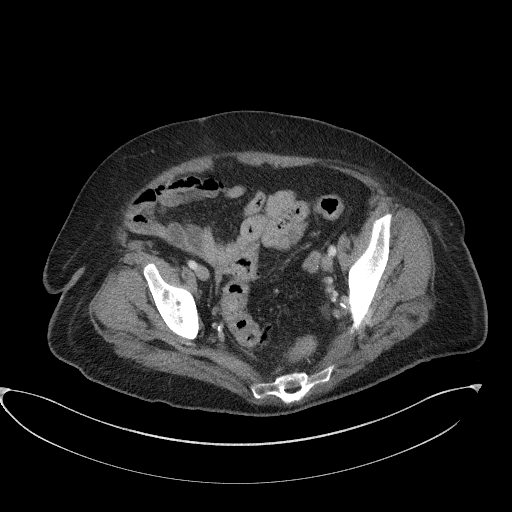
[im 35/99  soft-tissue]
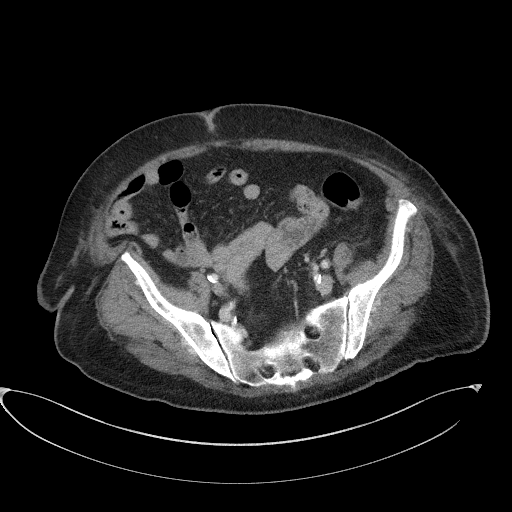
[im 45/99  soft-tissue]
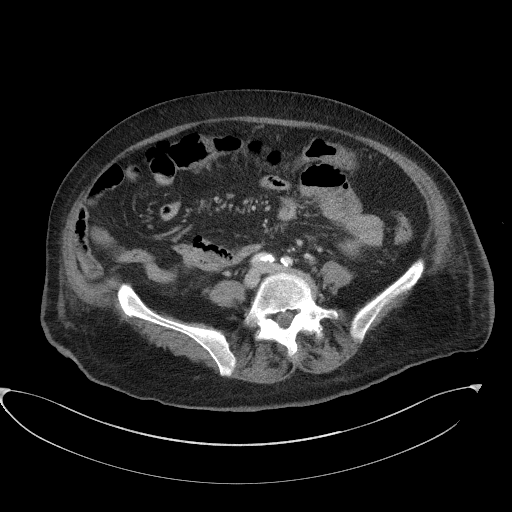
[im 50/99  soft-tissue]
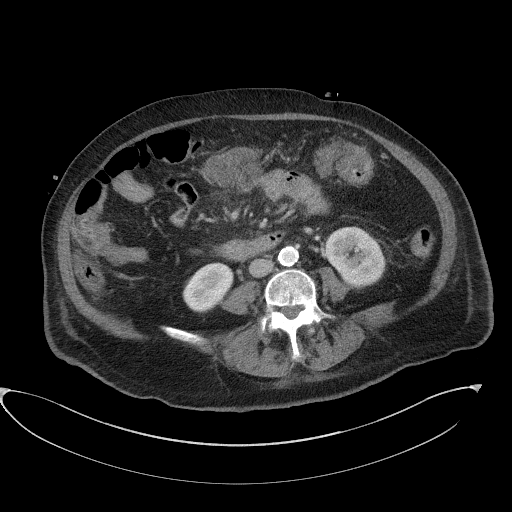
[im 54/99  soft-tissue]
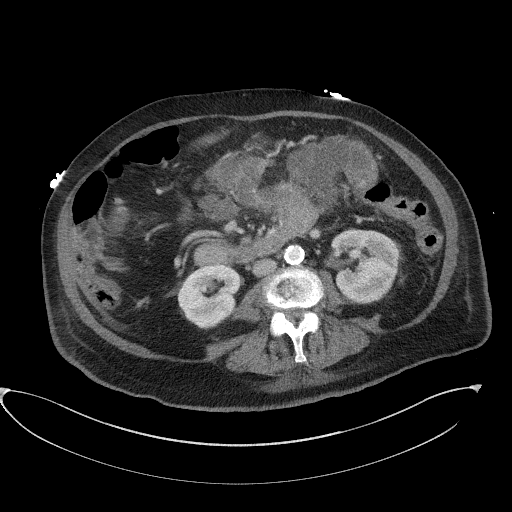
[im 64/99  soft-tissue]
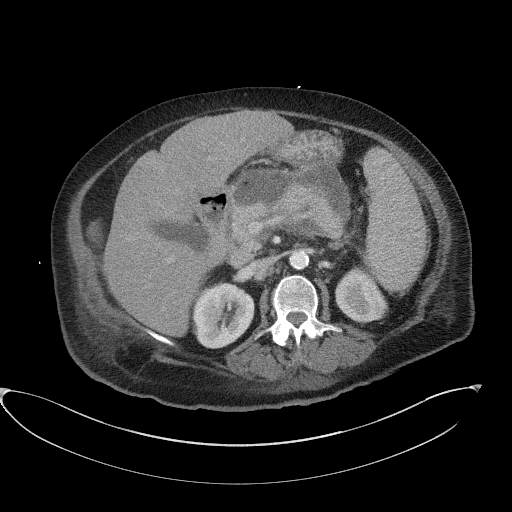
[im 64/99  bone]
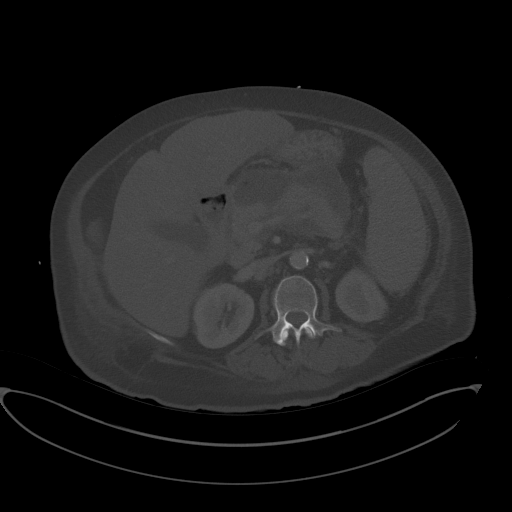
[im 69/99  soft-tissue]
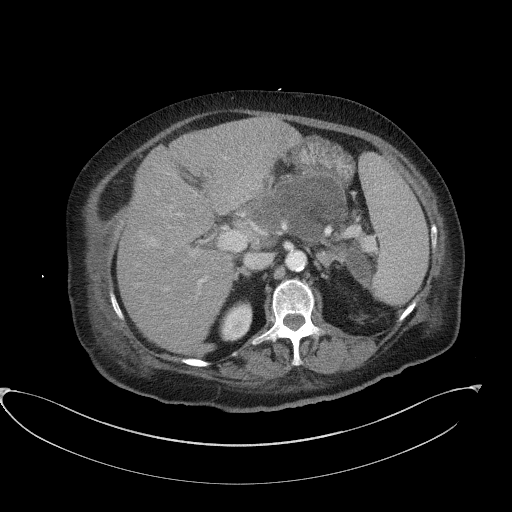
[im 79/99  soft-tissue]
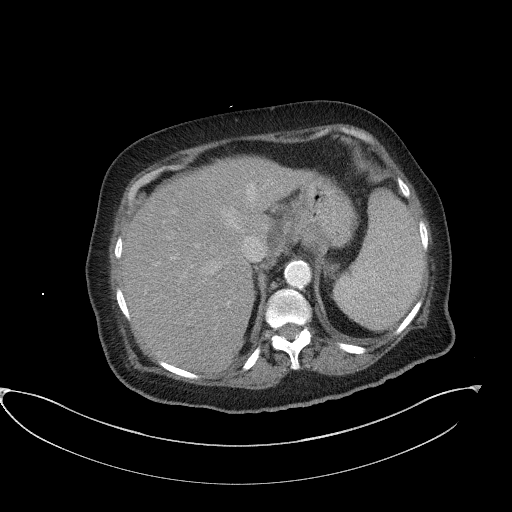
[im 84/99  soft-tissue]
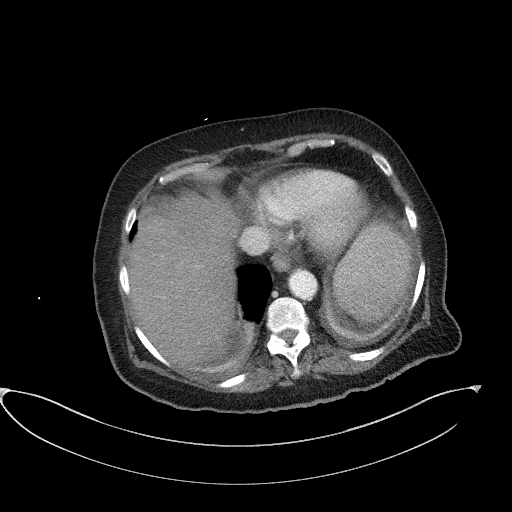
[im 94/99  soft-tissue]
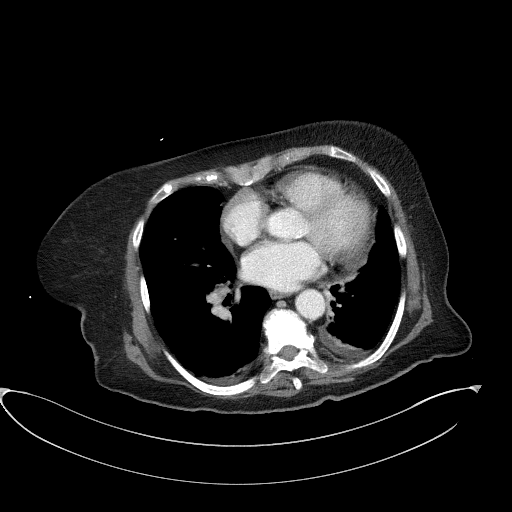

[Series 6: a/p w/ cor · coronal · 0.91mm/px · 3 of 169 slices shown]
[im 57/169  soft-tissue]
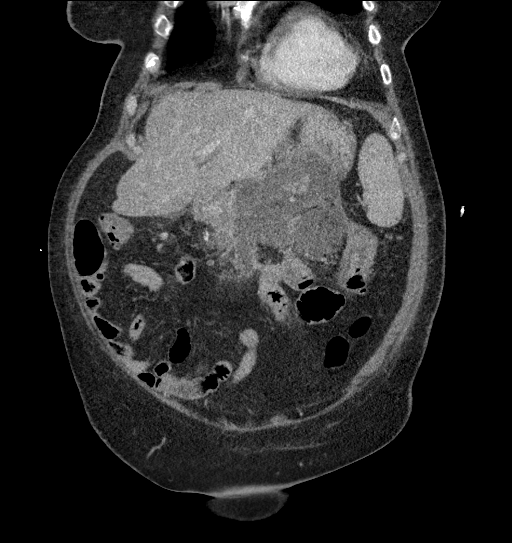
[im 75/169  soft-tissue]
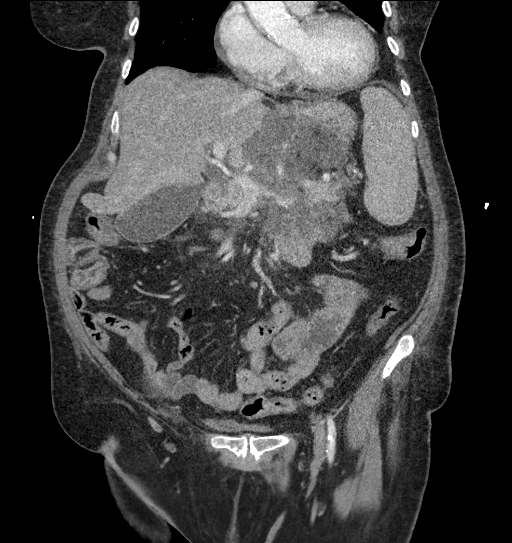
[im 94/169  soft-tissue]
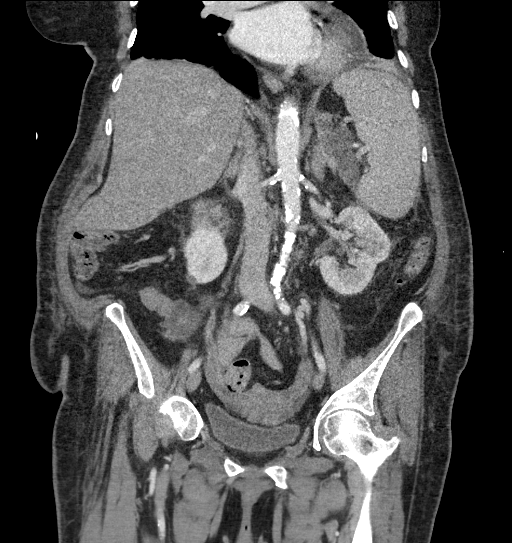

[16 of 46 positions shown; findings below may reference images not displayed]

FINDINGS: Lower chest: Minimal bilateral pleural effusions are noted with
adjacent subsegmental atelectasis.

Hepatobiliary: Hepatic steatosis is noted. No cholelithiasis or
biliary dilatation is noted.

Pancreas: There is interval development of multiple fluid
collections around the pancreas, some with peripheral wall
enhancement. These are consistent with developing pseudocysts. One
noted inferiorly measures 6.7 x 4.6 cm. Another measures 7.8 x
cm. 9.1 x 5.3 cm fluid collection is seen superior to the pancreas,
also consistent with developing pseudocyst.

Spleen: Normal in size without focal abnormality.

Adrenals/Urinary Tract: Adrenal glands are unremarkable. Kidneys are
normal, without renal calculi, focal lesion, or hydronephrosis.
Bladder is unremarkable.

Stomach/Bowel: Stomach is within normal limits. Appendix appears
normal. No evidence of bowel wall thickening, distention, or
inflammatory changes.

Vascular/Lymphatic: Aortic atherosclerosis. No enlarged abdominal or
pelvic lymph nodes.

Reproductive: Uterus and bilateral adnexa are unremarkable.

Other: No hernia is noted.

Musculoskeletal: No acute or significant osseous findings.
IMPRESSION: Interval development of multiple large peripancreatic fluid
collections with some peripheral wall enhancement, consistent with
developing pseudocysts. The largest measures 9.1 x 5.3 cm superior
to the pancreas.

Probable hepatic steatosis.

Minimal bilateral pleural effusions are noted with adjacent
subsegmental atelectasis.

Aortic Atherosclerosis (PKIQ9-DRP.P).

## 2019-09-18 IMAGING — CR CHEST - 2 VIEW
2 series · 2 of 2 positions shown · non-contrast
Comparison: Chest radiograph 07/04/2018

CLINICAL DATA: Patient with fever for 1 day.

EXAM:
CHEST - 2 VIEW

[chest lat]
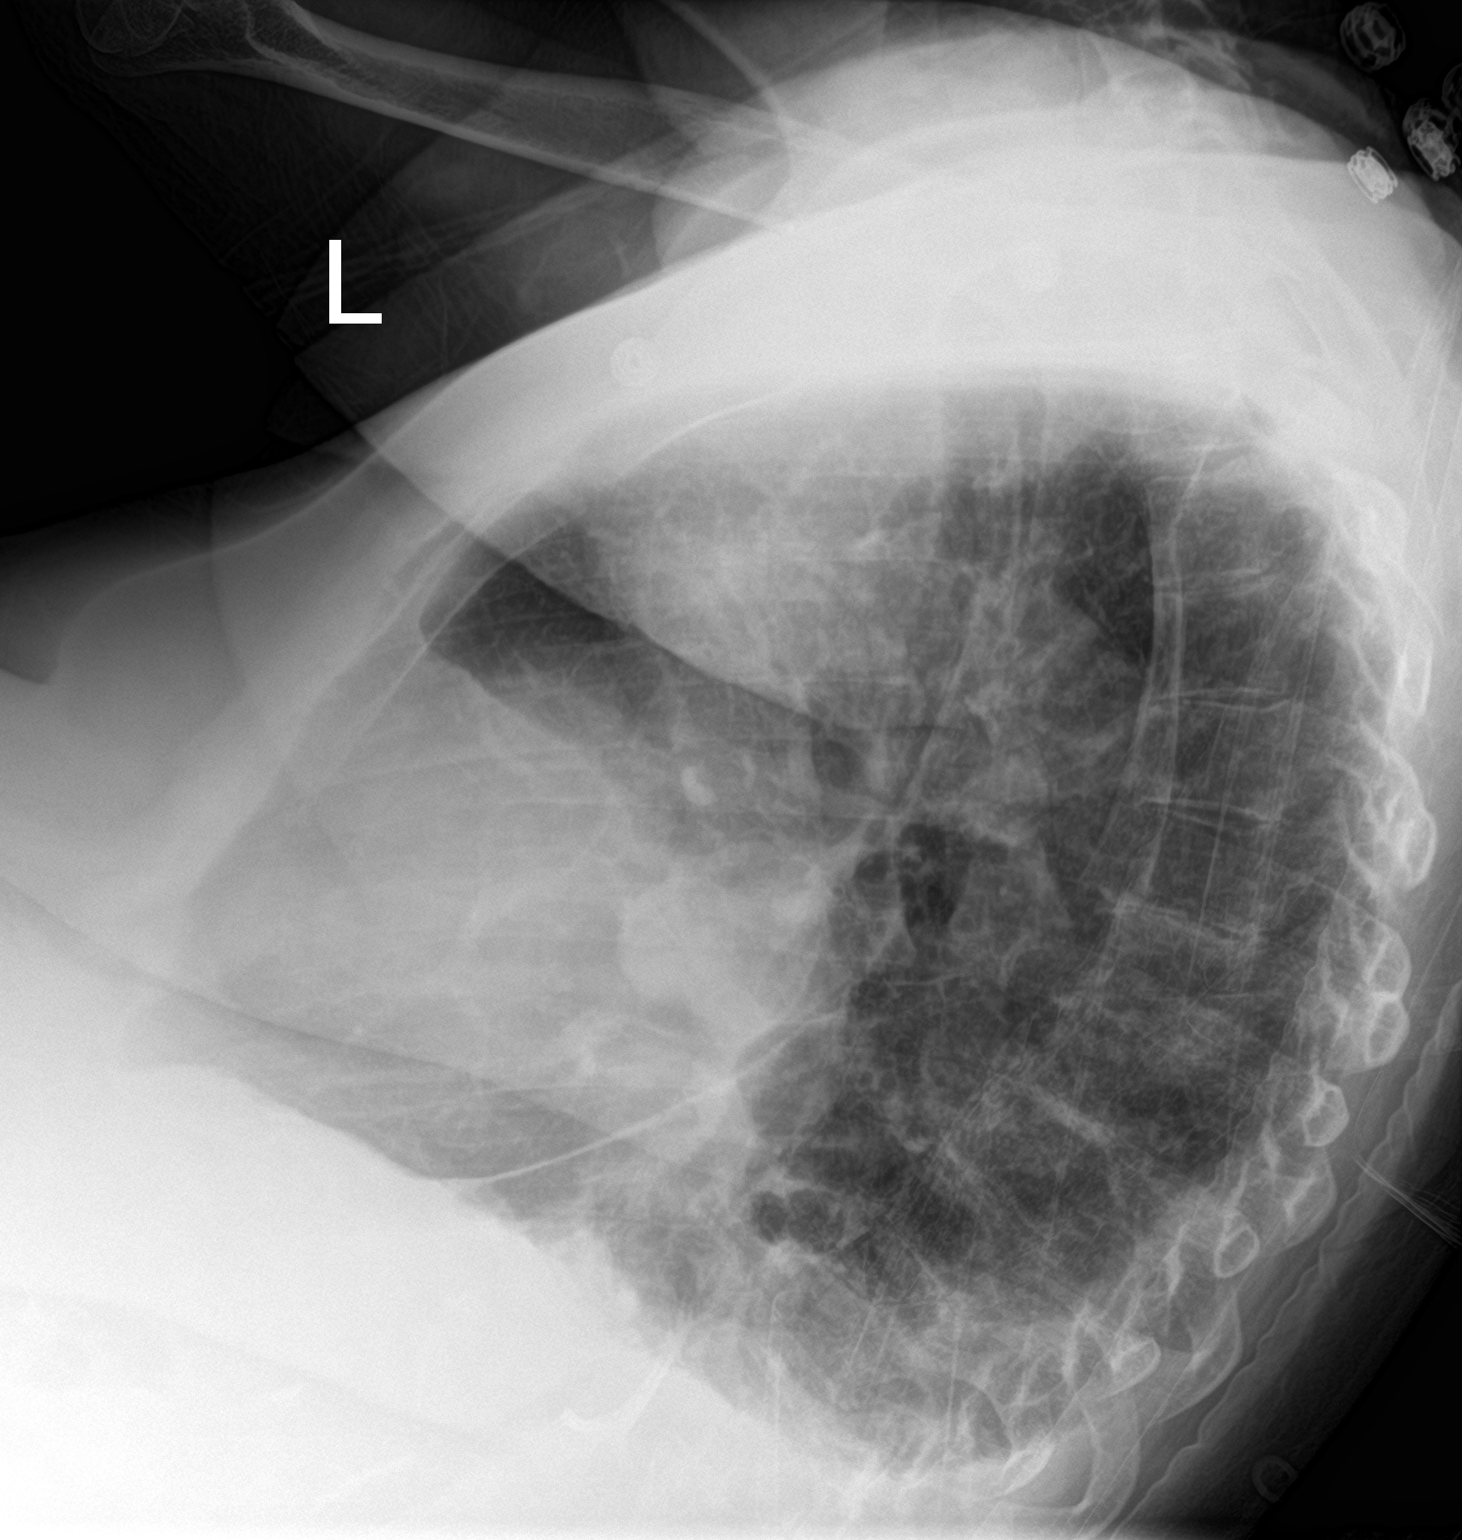

[chest ap]
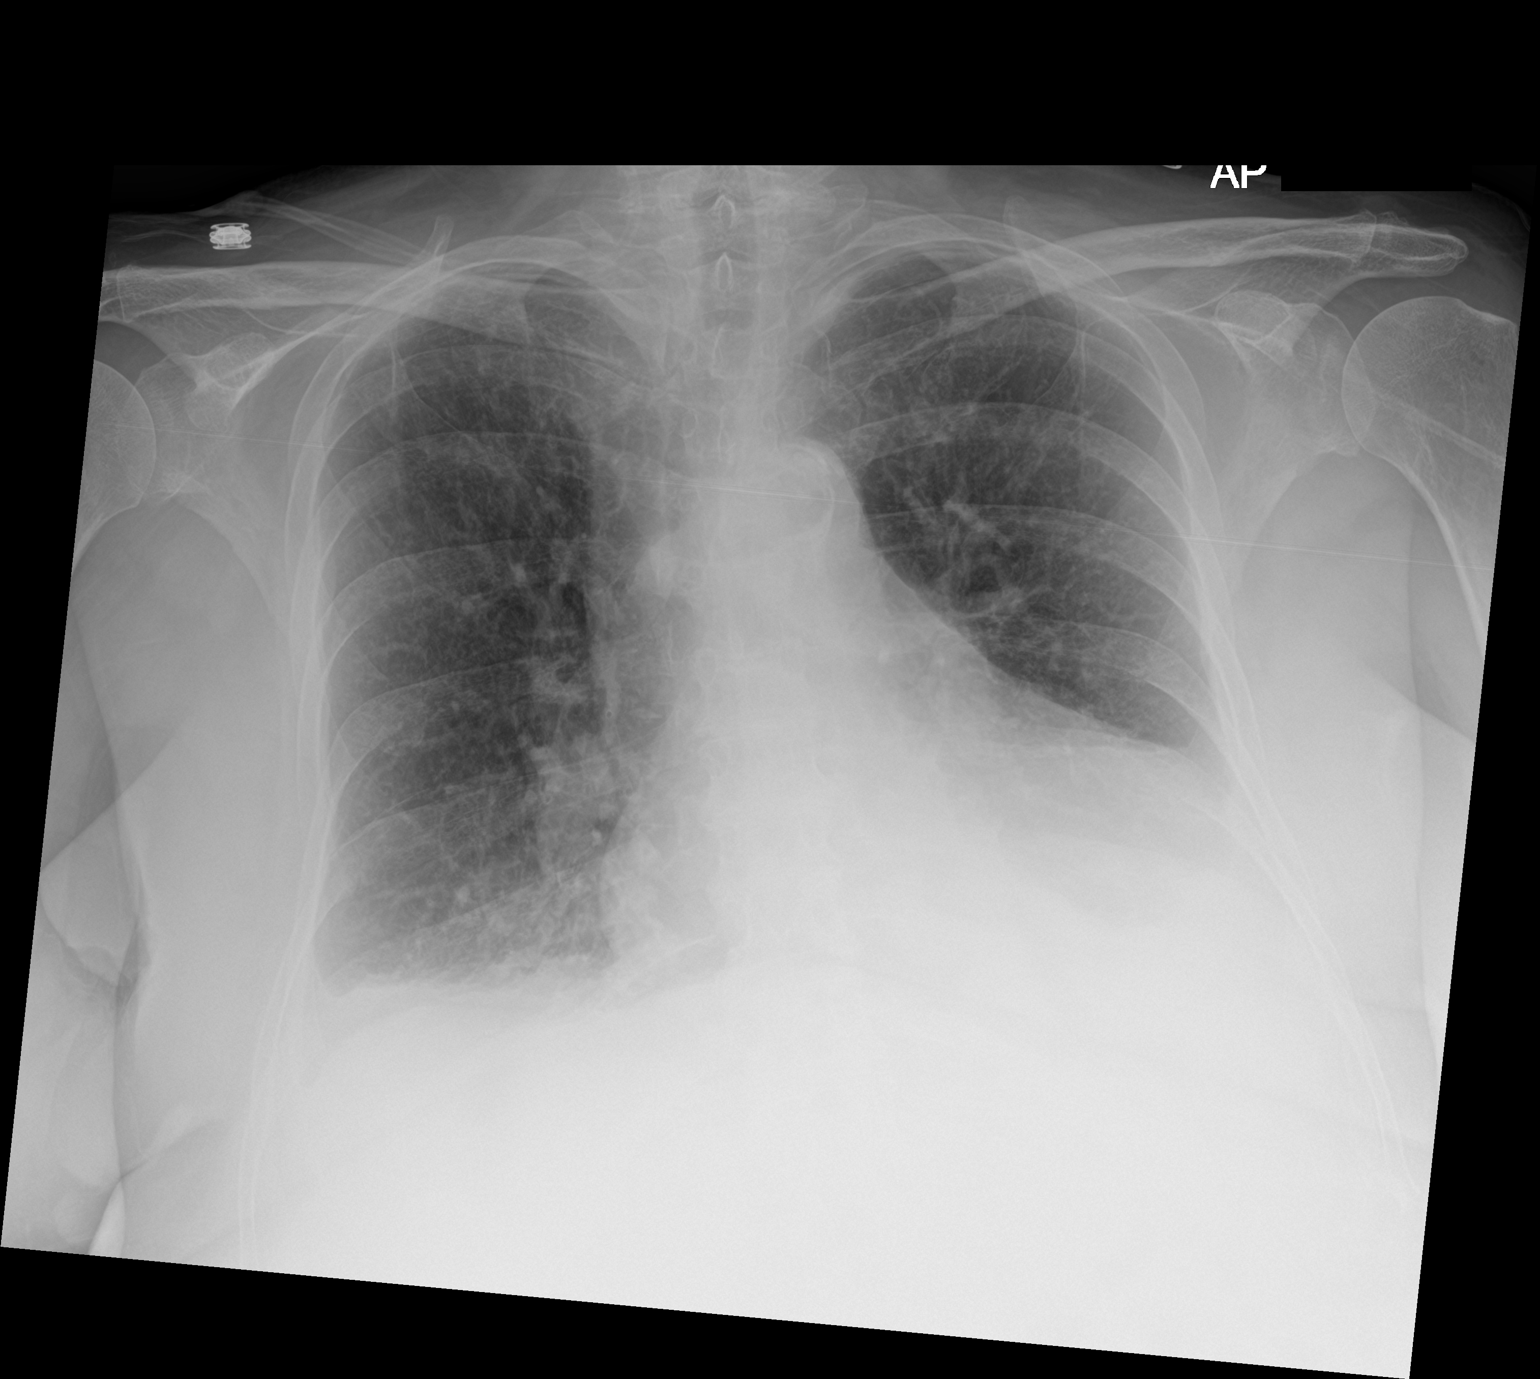

[2 of 2 positions shown; findings below may reference images not displayed]

FINDINGS: Stable cardiomegaly. Small bilateral pleural effusions, left greater
than right with underlying pulmonary consolidation. Bilateral
interstitial pulmonary opacities. Thoracic spine degenerative
changes.
IMPRESSION: Cardiomegaly.

Findings suggestive of interstitial pulmonary edema, small effusions
and underlying atelectasis.

## 2019-09-20 IMAGING — DX PORTABLE ABDOMEN - 1 VIEW
1 series · 1 of 1 positions shown · non-contrast
Comparison: CT of the abdomen pelvis dated 07/06/2018

CLINICAL DATA: 77-year-old female with history of pancreatitis,
status post feeding tube placement.

EXAM:
PORTABLE ABDOMEN - 1 VIEW

[abdomen]
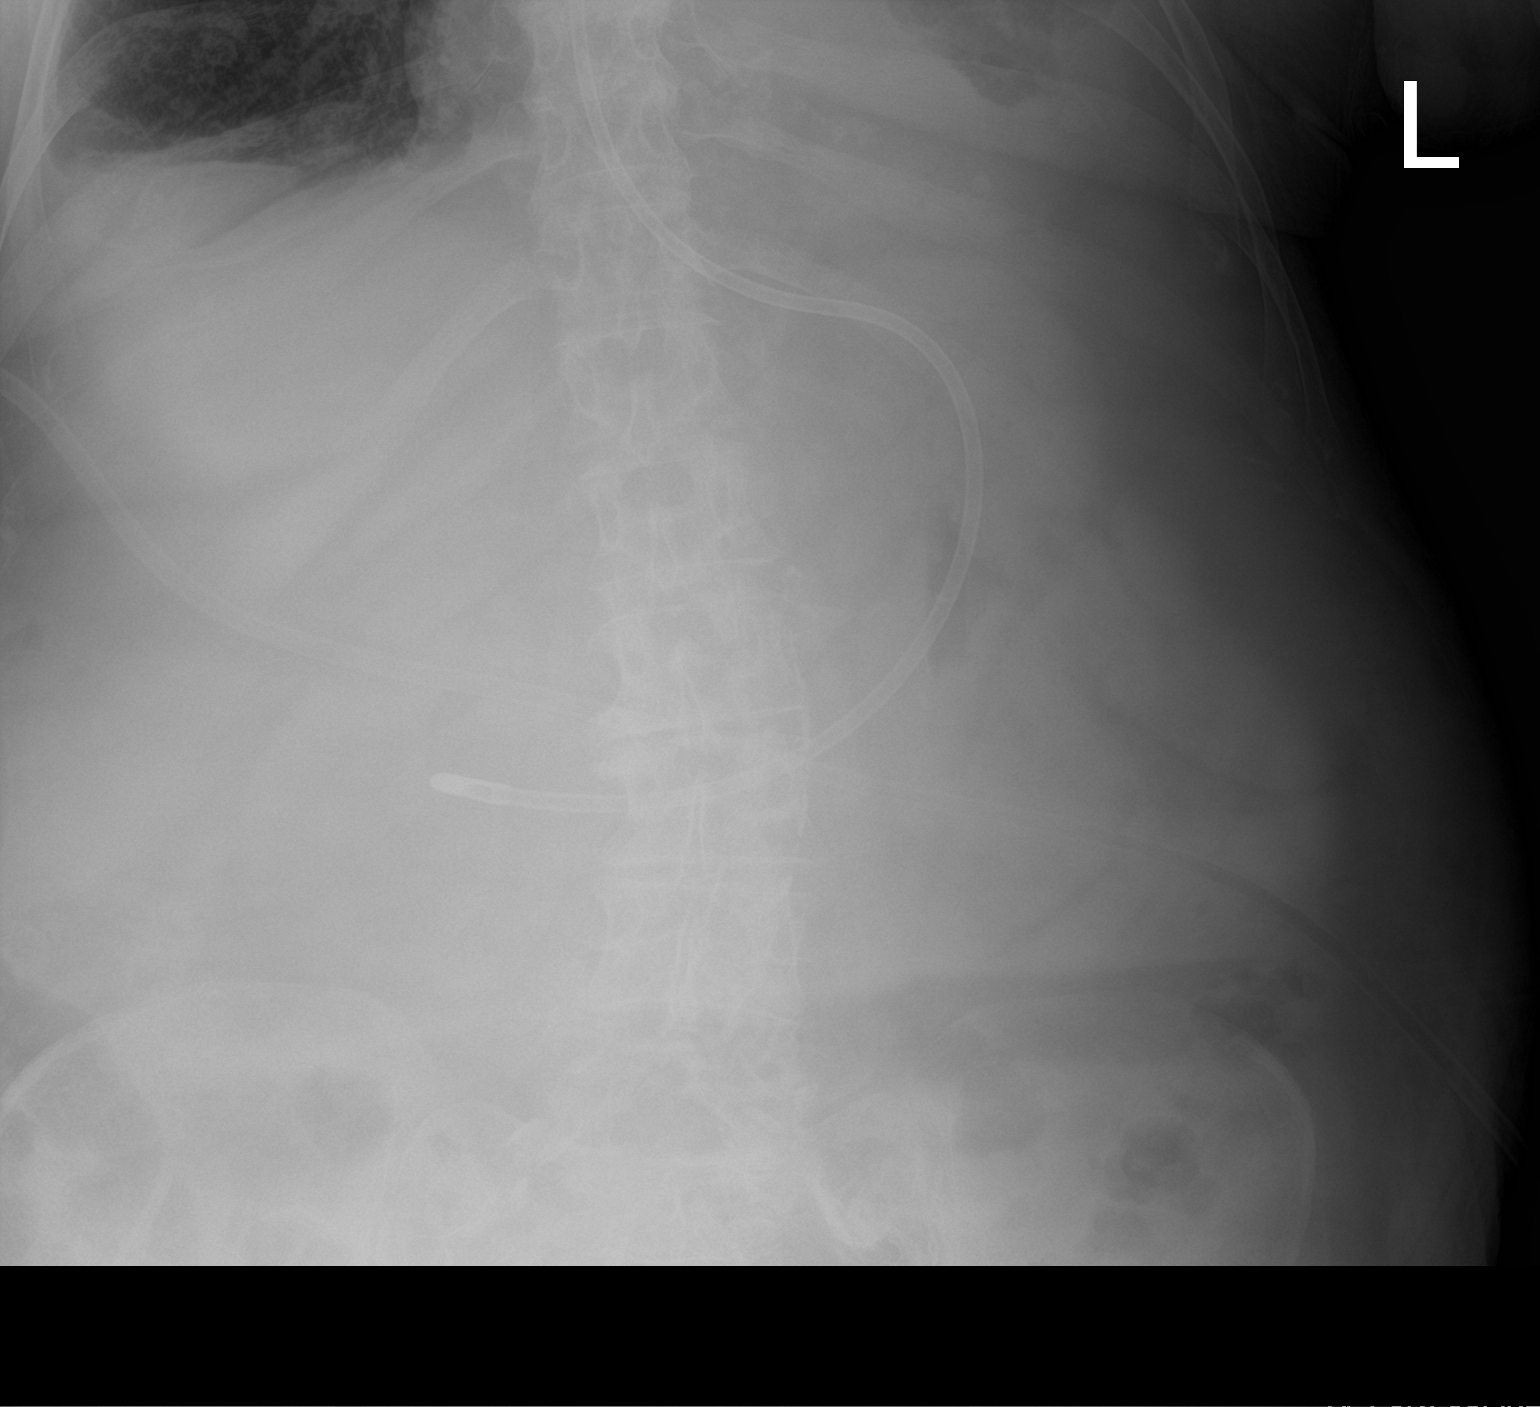

[1 of 1 positions shown; findings below may reference images not displayed]

FINDINGS: A feeding tube is partially visualized with tip to the right of the
spine likely in the distal stomach. No dilatation of the small
bowel. Air is noted in the colon. No free air identified on the
provided image. Left lung base consolidative changes partially
visualized. There is atherosclerotic calcification of the abdominal
aorta and iliac arteries. Degenerative changes of the spine. No
acute osseous pathology.
IMPRESSION: Feeding tube with tip likely in the distal stomach.

## 2019-10-09 ENCOUNTER — Other Ambulatory Visit: Payer: Self-pay | Admitting: Family Medicine

## 2019-10-09 DIAGNOSIS — I5032 Chronic diastolic (congestive) heart failure: Secondary | ICD-10-CM

## 2019-10-09 DIAGNOSIS — E119 Type 2 diabetes mellitus without complications: Secondary | ICD-10-CM

## 2019-10-09 DIAGNOSIS — I10 Essential (primary) hypertension: Secondary | ICD-10-CM

## 2019-10-09 DIAGNOSIS — Z8673 Personal history of transient ischemic attack (TIA), and cerebral infarction without residual deficits: Secondary | ICD-10-CM

## 2019-11-21 ENCOUNTER — Other Ambulatory Visit: Payer: Self-pay | Admitting: Cardiology

## 2019-11-28 ENCOUNTER — Encounter: Payer: Self-pay | Admitting: *Deleted

## 2019-12-18 ENCOUNTER — Other Ambulatory Visit: Payer: Self-pay | Admitting: Family Medicine

## 2019-12-18 DIAGNOSIS — G479 Sleep disorder, unspecified: Secondary | ICD-10-CM

## 2019-12-18 DIAGNOSIS — F321 Major depressive disorder, single episode, moderate: Secondary | ICD-10-CM

## 2020-01-02 ENCOUNTER — Other Ambulatory Visit: Payer: Self-pay | Admitting: Family Medicine

## 2020-01-02 ENCOUNTER — Ambulatory Visit: Payer: Medicare HMO | Admitting: Family Medicine

## 2020-01-02 DIAGNOSIS — I5032 Chronic diastolic (congestive) heart failure: Secondary | ICD-10-CM

## 2020-01-02 DIAGNOSIS — Z8673 Personal history of transient ischemic attack (TIA), and cerebral infarction without residual deficits: Secondary | ICD-10-CM

## 2020-01-02 DIAGNOSIS — E119 Type 2 diabetes mellitus without complications: Secondary | ICD-10-CM

## 2020-01-02 DIAGNOSIS — I10 Essential (primary) hypertension: Secondary | ICD-10-CM

## 2020-01-17 ENCOUNTER — Encounter: Payer: Self-pay | Admitting: Family Medicine

## 2020-01-17 ENCOUNTER — Ambulatory Visit (INDEPENDENT_AMBULATORY_CARE_PROVIDER_SITE_OTHER): Payer: Medicare HMO | Admitting: Family Medicine

## 2020-01-17 DIAGNOSIS — F321 Major depressive disorder, single episode, moderate: Secondary | ICD-10-CM | POA: Diagnosis not present

## 2020-01-17 DIAGNOSIS — Z8673 Personal history of transient ischemic attack (TIA), and cerebral infarction without residual deficits: Secondary | ICD-10-CM | POA: Diagnosis not present

## 2020-01-17 DIAGNOSIS — I700 Atherosclerosis of aorta: Secondary | ICD-10-CM | POA: Diagnosis not present

## 2020-01-17 DIAGNOSIS — K2100 Gastro-esophageal reflux disease with esophagitis, without bleeding: Secondary | ICD-10-CM

## 2020-01-17 DIAGNOSIS — E119 Type 2 diabetes mellitus without complications: Secondary | ICD-10-CM

## 2020-01-17 DIAGNOSIS — E782 Mixed hyperlipidemia: Secondary | ICD-10-CM

## 2020-01-17 DIAGNOSIS — I5032 Chronic diastolic (congestive) heart failure: Secondary | ICD-10-CM | POA: Diagnosis not present

## 2020-01-17 DIAGNOSIS — I10 Essential (primary) hypertension: Secondary | ICD-10-CM

## 2020-01-17 DIAGNOSIS — G479 Sleep disorder, unspecified: Secondary | ICD-10-CM

## 2020-01-17 DIAGNOSIS — E538 Deficiency of other specified B group vitamins: Secondary | ICD-10-CM

## 2020-01-17 MED ORDER — PANTOPRAZOLE SODIUM 40 MG PO TBEC
40.0000 mg | DELAYED_RELEASE_TABLET | Freq: Every day | ORAL | 1 refills | Status: DC
Start: 2020-01-17 — End: 2020-04-25

## 2020-01-17 MED ORDER — GLIPIZIDE ER 5 MG PO TB24
5.0000 mg | ORAL_TABLET | Freq: Every day | ORAL | 1 refills | Status: DC
Start: 2020-01-17 — End: 2020-03-19

## 2020-01-17 MED ORDER — BLOOD GLUCOSE MONITOR KIT
PACK | 0 refills | Status: DC
Start: 2020-01-17 — End: 2020-04-25

## 2020-01-17 MED ORDER — LOSARTAN POTASSIUM 100 MG PO TABS
100.0000 mg | ORAL_TABLET | Freq: Every day | ORAL | 1 refills | Status: DC
Start: 2020-01-17 — End: 2020-04-25

## 2020-01-17 MED ORDER — AMLODIPINE BESYLATE 10 MG PO TABS
10.0000 mg | ORAL_TABLET | Freq: Every day | ORAL | 1 refills | Status: DC
Start: 2020-01-17 — End: 2020-03-19

## 2020-01-17 MED ORDER — ATORVASTATIN CALCIUM 10 MG PO TABS: 10.0000 mg | ORAL_TABLET | Freq: Every day | ORAL | 1 refills | Status: DC

## 2020-01-17 MED ORDER — METFORMIN HCL 500 MG PO TABS: ORAL_TABLET | ORAL | 1 refills | Status: DC

## 2020-01-17 MED ORDER — MIRTAZAPINE 15 MG PO TABS: 15.0000 mg | ORAL_TABLET | Freq: Every day | ORAL | 1 refills | Status: DC

## 2020-01-17 MED ORDER — CLOPIDOGREL BISULFATE 75 MG PO TABS: 75.0000 mg | ORAL_TABLET | Freq: Every day | ORAL | 1 refills | Status: DC

## 2020-01-17 MED ORDER — CLONIDINE HCL 0.2 MG PO TABS
0.2000 mg | ORAL_TABLET | Freq: Two times a day (BID) | ORAL | 1 refills | Status: DC
Start: 2020-01-17 — End: 2020-04-25

## 2020-01-17 NOTE — Progress Notes (Unsigned)
Virtual Visit via Telephone Note  I connected with Heather Oconnor on 01/17/20 at 8:13 AM by telephone and verified that I am speaking with the correct person using two identifiers. Heather Oconnor is currently located at home and nobody is currently with her during this visit. The provider, Loman Brooklyn, FNP is located in their home at time of visit.  I discussed the limitations, risks, security and privacy concerns of performing an evaluation and management service by telephone and the availability of in person appointments. I also discussed with the patient that there may be a patient responsible charge related to this service. The patient expressed understanding and agreed to proceed.  Subjective: PCP: Loman Brooklyn, FNP  Chief Complaint  Patient presents with  . Medical Management of Chronic Issues   Depression: patient reports this month is a little harder for her as this is the month her husband died in a year ago. She is happy with the mirtazapine. She has not done grief counseling, but has been talking with family and her pastor at church.   Depression screen [facility] 2/9 01/17/2020 07/10/2019 12/13/2018  Decreased Interest 0 1 0  Down, Depressed, Hopeless 1 1 0  PHQ - 2 Score 1 2 0  Altered sleeping 0 3 -  Tired, decreased energy 3 3 -  Change in appetite 0 1 -  Feeling bad or failure about yourself  0 0 -  Trouble concentrating 0 0 -  Moving slowly or fidgety/restless 0 0 -  Suicidal thoughts 0 0 -  PHQ-9 Score 4 9 -  Difficult doing work/chores Somewhat difficult - -   Difficulty Sleeping: patient reports she is sleeping well with the increase in mirtazapine.   Hypertension: patient reports she is checking her BP at home regularly and gets readings in the 130s/70-80s.   Diabetes: Patient presents for follow up of diabetes. Current symptoms include: none. Known diabetic complications: cardiovascular disease and cerebrovascular disease. Medication compliance: yes.  Current diet: in general, a "healthy" diet  . Current exercise: none. Home blood sugar records: patient does not check sugars. Is she  on ACE inhibitor or angiotensin II receptor blocker? Yes. Is she on a statin? Yes.   Lab Results  Component Value Date   HGBA1C 6.1 07/10/2019   HGBA1C 6.5 04/09/2019   HGBA1C 7.1 (H) 11/02/2018   Lab Results  Component Value Date   LDLCALC 85 04/09/2019   CREATININE 0.74 07/10/2019      ROS: Per HPI  Current Outpatient Medications:  .  acetaminophen (TYLENOL) 500 MG tablet, Take 2 tablets (1,000 mg total) by mouth every 6 (six) hours as needed., Disp: 12 tablet, Rfl: 0 .  amLODipine (NORVASC) 10 MG tablet, TAKE 1 TABLET EVERY DAY, Disp: 90 tablet, Rfl: 0 .  atorvastatin (LIPITOR) 10 MG tablet, Take 1 tablet (10 mg total) by mouth daily at 6 PM., Disp: 90 tablet, Rfl: 2 .  cloNIDine (CATAPRES) 0.2 MG tablet, TAKE ONE TABLET BY MOUTH TWICE DAILY, Disp: 180 tablet, Rfl: 0 .  clopidogrel (PLAVIX) 75 MG tablet, TAKE 1 TABLET EVERY DAY, Disp: 90 tablet, Rfl: 0 .  dorzolamide-timolol (COSOPT) 22.3-6.8 MG/ML ophthalmic solution, Place 1 drop into both eyes 2 (two) times daily., Disp: , Rfl:  .  glipiZIDE (GLUCOTROL XL) 5 MG 24 hr tablet, TAKE 1 TABLET EVERY DAY, Disp: 90 tablet, Rfl: 0 .  hydrALAZINE (APRESOLINE) 50 MG tablet, Take 1 tablet (50 mg total) by mouth as directed. Take (1) tab  in AM (2) tabs afternoon (2) tabs evening, Disp: 450 tablet, Rfl: 3 .  losartan (COZAAR) 100 MG tablet, Take 1 tablet (100 mg total) by mouth daily., Disp: 90 tablet, Rfl: 2 .  metFORMIN (GLUCOPHAGE) 500 MG tablet, TAKE 1 TABLET EVERY DAY WITH BREAKFAST AND TAKE 2 TABLETS EVERY DAY WITH SUPPER, Disp: 270 tablet, Rfl: 0 .  mirtazapine (REMERON) 15 MG tablet, TAKE ONE TABLET DAILY AT BEDTIME, Disp: 30 tablet, Rfl: 0 .  pantoprazole (PROTONIX) 40 MG tablet, Take 1 tablet (40 mg total) by mouth daily., Disp: 90 tablet, Rfl: 2 .  spironolactone (ALDACTONE) 25 MG tablet, TAKE 1  TABLET EVERY DAY, Disp: 90 tablet, Rfl: 1 .  timolol (TIMOPTIC) 0.5 % ophthalmic solution, Place 1 drop into both eyes daily., Disp: , Rfl:   Allergies  Allergen Reactions  . Hctz [Hydrochlorothiazide]     Per GI may have caused pancreatitis. Dr. Ardis Hughs recommended never resuming.  . Lisinopril     H/O pancreatitis.   Past Medical History:  Diagnosis Date  . Blind right eye   . Cholecystitis   . Cirrhosis ([place])   . Diabetes ([place])   . GERD (gastroesophageal reflux disease)   . Hemorrhoids   . Hypertension   . IBS (irritable bowel syndrome)   . Pancreatitis   . Transient cerebral ischemia 05/08/2008   Qualifier: Diagnosis of  By: Zeb Comfort      Observations/Objective: A&O  No respiratory distress or wheezing audible over the phone Mood, judgement, and thought processes all WNL   Assessment and Plan: 1. Moderate major depression ([place]) - Well controlled on current regimen.  - mirtazapine (REMERON) 15 MG tablet; Take 1 tablet (15 mg total) by mouth at bedtime.  Dispense: 90 tablet; Refill: 1 - CMP14+EGFR; Future  2. Difficulty sleeping - Well controlled on current regimen.  - mirtazapine (REMERON) 15 MG tablet; Take 1 tablet (15 mg total) by mouth at bedtime.  Dispense: 90 tablet; Refill: 1 - CMP14+EGFR; Future  3. Type 2 diabetes mellitus without complication, without long-term current use of insulin ([place]) - Patient to come in for lab work, to include A1c, to assess for diabetic control. - Medications: Continue current medications until labs result. - Home glucose monitoring: Not able to monitor.  New glucometer order sent to [facility] via fax. - Patient is currently taking a statin. Patient is taking an ACE-inhibitor/ARB.  - Last foot exam: 01/02/2019; unable to perform today as this was a telephone visit. - Last diabetic eye exam: 07/21/2019 - Urine Microalbumin/Creat Ratio: 04/09/2019 - glipiZIDE (GLUCOTROL XL) 5 MG 24 hr tablet; Take 1 tablet (5 mg total) by mouth  daily.  Dispense: 90 tablet; Refill: 1 - losartan (COZAAR) 100 MG tablet; Take 1 tablet (100 mg total) by mouth daily.  Dispense: 90 tablet; Refill: 1 - metFORMIN (GLUCOPHAGE) 500 MG tablet; Take 1 tablet (500 mg total) by mouth daily with breakfast AND 2 tablets (1,000 mg total) daily with supper.  Dispense: 270 tablet; Refill: 1 - atorvastatin (LIPITOR) 10 MG tablet; Take 1 tablet (10 mg total) by mouth daily at 6 PM.  Dispense: 90 tablet; Refill: 1 - blood glucose meter kit and supplies KIT; Dispense based on patient and insurance preference. Use up to four times daily as directed. (FOR ICD-9 250.00, 250.01).  Dispense: 1 each; Refill: 0 - Microalbumin / creatinine urine ratio; Future - CMP14+EGFR; Future - Lipid panel; Future - Bayer DCA Hb A1c Waived; Future - CBC with Differential/Platelet; Future  4. Essential  hypertension - Well controlled on current regimen.  - losartan (COZAAR) 100 MG tablet; Take 1 tablet (100 mg total) by mouth daily.  Dispense: 90 tablet; Refill: 1 - amLODipine (NORVASC) 10 MG tablet; Take 1 tablet (10 mg total) by mouth daily.  Dispense: 90 tablet; Refill: 1 - cloNIDine (CATAPRES) 0.2 MG tablet; Take 1 tablet (0.2 mg total) by mouth 2 (two) times daily.  Dispense: 180 tablet; Refill: 1 - CMP14+EGFR; Future - Lipid panel; Future - CBC with Differential/Platelet; Future  5. Mixed hyperlipidemia - Well controlled on current regimen.  - atorvastatin (LIPITOR) 10 MG tablet; Take 1 tablet (10 mg total) by mouth daily at 6 PM.  Dispense: 90 tablet; Refill: 1 - CMP14+EGFR; Future - Lipid panel; Future  6. Atherosclerosis of aorta (HCC) - Continue statin medication. - atorvastatin (LIPITOR) 10 MG tablet; Take 1 tablet (10 mg total) by mouth daily at 6 PM.  Dispense: 90 tablet; Refill: 1 - CMP14+EGFR; Future - Lipid panel; Future  7. Chronic diastolic CHF (congestive heart failure) (HCC) - Well controlled on current regimen.  - amLODipine (NORVASC) 10 MG tablet;  Take 1 tablet (10 mg total) by mouth daily.  Dispense: 90 tablet; Refill: 1 - CMP14+EGFR; Future  8. Gastroesophageal reflux disease with esophagitis without hemorrhage - Well controlled on current regimen.  - pantoprazole (PROTONIX) 40 MG tablet; Take 1 tablet (40 mg total) by mouth daily.  Dispense: 90 tablet; Refill: 1 - CMP14+EGFR; Future  9. History of CVA in adulthood - Continue Plavix and atorvastatin. - clopidogrel (PLAVIX) 75 MG tablet; Take 1 tablet (75 mg total) by mouth daily.  Dispense: 90 tablet; Refill: 1 - atorvastatin (LIPITOR) 10 MG tablet; Take 1 tablet (10 mg total) by mouth daily at 6 PM.  Dispense: 90 tablet; Refill: 1  10. B12 deficiency - Patient continues with B12 supplement.  Labs today to assess. - Vitamin B12; Future   Follow Up Instructions: Return for as directed after labs result.  I discussed the assessment and treatment plan with the patient. The patient was provided an opportunity to ask questions and all were answered. The patient agreed with the plan and demonstrated an understanding of the instructions.   The patient was advised to call back or seek an in-person evaluation if the symptoms worsen or if the condition fails to improve as anticipated.  The above assessment and management plan was discussed with the patient. The patient verbalized understanding of and has agreed to the management plan. Patient is aware to call the clinic if symptoms persist or worsen. Patient is aware when to return to the clinic for a follow-up visit. Patient educated on when it is appropriate to go to the emergency department.   Time call ended: 8:40 AM  I provided 29 minutes of non-face-to-face time during this encounter.  Hendricks Limes, MSN, APRN, FNP-C [facility] 01/17/20

## 2020-01-18 DIAGNOSIS — F321 Major depressive disorder, single episode, moderate: Secondary | ICD-10-CM | POA: Insufficient documentation

## 2020-01-18 DIAGNOSIS — I700 Atherosclerosis of aorta: Secondary | ICD-10-CM | POA: Insufficient documentation

## 2020-01-18 DIAGNOSIS — G479 Sleep disorder, unspecified: Secondary | ICD-10-CM | POA: Insufficient documentation

## 2020-01-21 ENCOUNTER — Telehealth: Payer: Self-pay | Admitting: *Deleted

## 2020-01-21 MED ORDER — TRUE METRIX AIR GLUCOSE METER W/DEVICE KIT: PACK | 0 refills | Status: DC

## 2020-01-21 NOTE — Telephone Encounter (Signed)
Fax from [employer], needing signature for truemetrix meter kit

## 2020-01-25 ENCOUNTER — Other Ambulatory Visit: Payer: Self-pay | Admitting: *Deleted

## 2020-01-25 MED ORDER — TRUEPLUS LANCETS 33G MISC: 3 refills | Status: DC

## 2020-01-29 ENCOUNTER — Other Ambulatory Visit: Payer: Medicare HMO

## 2020-01-29 ENCOUNTER — Other Ambulatory Visit: Payer: Self-pay

## 2020-01-29 DIAGNOSIS — K2100 Gastro-esophageal reflux disease with esophagitis, without bleeding: Secondary | ICD-10-CM | POA: Diagnosis not present

## 2020-01-29 DIAGNOSIS — G479 Sleep disorder, unspecified: Secondary | ICD-10-CM | POA: Diagnosis not present

## 2020-01-29 DIAGNOSIS — I700 Atherosclerosis of aorta: Secondary | ICD-10-CM | POA: Diagnosis not present

## 2020-01-29 DIAGNOSIS — E782 Mixed hyperlipidemia: Secondary | ICD-10-CM | POA: Diagnosis not present

## 2020-01-29 DIAGNOSIS — I5032 Chronic diastolic (congestive) heart failure: Secondary | ICD-10-CM | POA: Diagnosis not present

## 2020-01-29 DIAGNOSIS — E538 Deficiency of other specified B group vitamins: Secondary | ICD-10-CM | POA: Diagnosis not present

## 2020-01-29 DIAGNOSIS — E119 Type 2 diabetes mellitus without complications: Secondary | ICD-10-CM

## 2020-01-29 DIAGNOSIS — I10 Essential (primary) hypertension: Secondary | ICD-10-CM | POA: Diagnosis not present

## 2020-01-29 DIAGNOSIS — F321 Major depressive disorder, single episode, moderate: Secondary | ICD-10-CM

## 2020-01-29 LAB — BAYER DCA HB A1C WAIVED: HB A1C (BAYER DCA - WAIVED): 7.7 % — ABNORMAL HIGH (ref <7.0)

## 2020-01-30 LAB — CBC WITH DIFFERENTIAL/PLATELET
Basophils Absolute: 0.1 10*3/uL (ref 0.0–0.2)
Basos: 1 %
EOS (ABSOLUTE): 0.3 10*3/uL (ref 0.0–0.4)
Eos: 3 %
Hematocrit: 46 % (ref 34.0–46.6)
Hemoglobin: 15.1 g/dL (ref 11.1–15.9)
Immature Grans (Abs): 0 10*3/uL (ref 0.0–0.1)
Immature Granulocytes: 0 %
Lymphocytes Absolute: 2 10*3/uL (ref 0.7–3.1)
Lymphs: 20 %
MCH: 28.4 pg (ref 26.6–33.0)
MCHC: 32.8 g/dL (ref 31.5–35.7)
MCV: 87 fL (ref 79–97)
Monocytes Absolute: 0.6 10*3/uL (ref 0.1–0.9)
Monocytes: 6 %
Neutrophils Absolute: 7.2 10*3/uL — ABNORMAL HIGH (ref 1.4–7.0)
Neutrophils: 70 %
Platelets: 221 10*3/uL (ref 150–450)
RBC: 5.31 x10E6/uL — ABNORMAL HIGH (ref 3.77–5.28)
RDW: 13.2 % (ref 11.7–15.4)
WBC: 10.2 10*3/uL (ref 3.4–10.8)

## 2020-01-30 LAB — CMP14+EGFR
ALT: 14 IU/L (ref 0–32)
AST: 14 IU/L (ref 0–40)
Albumin/Globulin Ratio: 1.6 (ref 1.2–2.2)
Albumin: 4.7 g/dL (ref 3.7–4.7)
Alkaline Phosphatase: 60 IU/L (ref 44–121)
BUN/Creatinine Ratio: 10 — ABNORMAL LOW (ref 12–28)
BUN: 9 mg/dL (ref 8–27)
Bilirubin Total: 0.5 mg/dL (ref 0.0–1.2)
CO2: 24 mmol/L (ref 20–29)
Calcium: 10 mg/dL (ref 8.7–10.3)
Chloride: 102 mmol/L (ref 96–106)
Creatinine, Ser: 0.89 mg/dL (ref 0.57–1.00)
GFR calc Af Amer: 71 mL/min/{1.73_m2} (ref >59)
GFR calc non Af Amer: 62 mL/min/{1.73_m2} (ref >59)
Globulin, Total: 2.9 g/dL (ref 1.5–4.5)
Glucose: 166 mg/dL — ABNORMAL HIGH (ref 65–99)
Potassium: 3.9 mmol/L (ref 3.5–5.2)
Sodium: 144 mmol/L (ref 134–144)
Total Protein: 7.6 g/dL (ref 6.0–8.5)

## 2020-01-30 LAB — LIPID PANEL
Chol/HDL Ratio: 5.1 ratio — ABNORMAL HIGH (ref 0.0–4.4)
Cholesterol, Total: 162 mg/dL (ref 100–199)
HDL: 32 mg/dL — ABNORMAL LOW (ref >39)
LDL Chol Calc (NIH): 85 mg/dL (ref 0–99)
Triglycerides: 268 mg/dL — ABNORMAL HIGH (ref 0–149)
VLDL Cholesterol Cal: 45 mg/dL — ABNORMAL HIGH (ref 5–40)

## 2020-01-30 LAB — VITAMIN B12: Vitamin B-12: >2000 pg/mL — ABNORMAL HIGH (ref 232–1245)

## 2020-01-30 LAB — MICROALBUMIN / CREATININE URINE RATIO
Creatinine, Urine: 20.8 mg/dL
Microalb/Creat Ratio: 148 mg/g creat — ABNORMAL HIGH (ref 0–29)
Microalbumin, Urine: 30.7 ug/mL

## 2020-01-31 DIAGNOSIS — H44511 Absolute glaucoma, right eye: Secondary | ICD-10-CM | POA: Diagnosis not present

## 2020-01-31 DIAGNOSIS — H40052 Ocular hypertension, left eye: Secondary | ICD-10-CM | POA: Diagnosis not present

## 2020-02-04 ENCOUNTER — Telehealth: Payer: Self-pay

## 2020-02-04 ENCOUNTER — Encounter: Payer: Self-pay | Admitting: Family Medicine

## 2020-02-04 ENCOUNTER — Other Ambulatory Visit: Payer: Self-pay | Admitting: Family Medicine

## 2020-02-04 DIAGNOSIS — E119 Type 2 diabetes mellitus without complications: Secondary | ICD-10-CM

## 2020-02-04 DIAGNOSIS — E1121 Type 2 diabetes mellitus with diabetic nephropathy: Secondary | ICD-10-CM

## 2020-02-04 HISTORY — DX: Type 2 diabetes mellitus with diabetic nephropathy: E11.21

## 2020-02-04 MED ORDER — METFORMIN HCL 500 MG PO TABS
1000.0000 mg | ORAL_TABLET | Freq: Two times a day (BID) | ORAL | 1 refills | Status: DC
Start: 2020-02-04 — End: 2020-03-19

## 2020-02-04 NOTE — Telephone Encounter (Signed)
Addressed in lab results

## 2020-02-04 NOTE — Telephone Encounter (Signed)
Pt called and wants provider to review and call her with lab results.

## 2020-02-04 NOTE — Telephone Encounter (Signed)
Patient is calling concerning her lab results - please review and advise

## 2020-02-05 MED ORDER — SPIRONOLACTONE 25 MG PO TABS: 25.0000 mg | ORAL_TABLET | Freq: Every day | ORAL | 1 refills | Status: DC

## 2020-02-05 NOTE — Progress Notes (Signed)
Refilled

## 2020-03-18 ENCOUNTER — Other Ambulatory Visit: Payer: Self-pay | Admitting: Cardiology

## 2020-03-18 ENCOUNTER — Other Ambulatory Visit: Payer: Self-pay | Admitting: Family Medicine

## 2020-03-18 DIAGNOSIS — E119 Type 2 diabetes mellitus without complications: Secondary | ICD-10-CM

## 2020-03-18 DIAGNOSIS — I10 Essential (primary) hypertension: Secondary | ICD-10-CM

## 2020-03-18 DIAGNOSIS — Z8673 Personal history of transient ischemic attack (TIA), and cerebral infarction without residual deficits: Secondary | ICD-10-CM

## 2020-03-18 DIAGNOSIS — I5032 Chronic diastolic (congestive) heart failure: Secondary | ICD-10-CM

## 2020-03-31 ENCOUNTER — Telehealth: Payer: Self-pay | Admitting: Family Medicine

## 2020-03-31 MED ORDER — SPIRONOLACTONE 25 MG PO TABS: 25.0000 mg | ORAL_TABLET | Freq: Every day | ORAL | 0 refills | Status: DC

## 2020-03-31 NOTE — Telephone Encounter (Signed)
Pt aware refill sent to pharmacy 

## 2020-03-31 NOTE — Telephone Encounter (Signed)
  Prescription Request  03/31/2020  What is the name of the medication or equipment? Spironolactone  Have you contacted your pharmacy to request a refill? (if applicable) yes  Which pharmacy would you like this sent to? [facility]   Patient notified that their request is being sent to the clinical staff for review and that they should receive a response within 2 business days.

## 2020-04-09 ENCOUNTER — Ambulatory Visit: Payer: Medicare HMO | Admitting: Family Medicine

## 2020-04-20 ENCOUNTER — Other Ambulatory Visit: Payer: Self-pay | Admitting: Cardiology

## 2020-04-20 DIAGNOSIS — I10 Essential (primary) hypertension: Secondary | ICD-10-CM

## 2020-04-21 NOTE — Telephone Encounter (Signed)
Rx has been sent to the pharmacy electronically. ° °

## 2020-04-25 ENCOUNTER — Encounter: Payer: Self-pay | Admitting: Family Medicine

## 2020-04-25 ENCOUNTER — Ambulatory Visit (INDEPENDENT_AMBULATORY_CARE_PROVIDER_SITE_OTHER): Payer: Medicare HMO | Admitting: Family Medicine

## 2020-04-25 ENCOUNTER — Other Ambulatory Visit: Payer: Self-pay

## 2020-04-25 VITALS — BP 132/71 | HR 80 | Temp 97.3°F | Ht 64.0 in | Wt 159.6 lb

## 2020-04-25 DIAGNOSIS — I5032 Chronic diastolic (congestive) heart failure: Secondary | ICD-10-CM | POA: Diagnosis not present

## 2020-04-25 DIAGNOSIS — I700 Atherosclerosis of aorta: Secondary | ICD-10-CM | POA: Diagnosis not present

## 2020-04-25 DIAGNOSIS — E119 Type 2 diabetes mellitus without complications: Secondary | ICD-10-CM

## 2020-04-25 DIAGNOSIS — G479 Sleep disorder, unspecified: Secondary | ICD-10-CM | POA: Diagnosis not present

## 2020-04-25 DIAGNOSIS — J439 Emphysema, unspecified: Secondary | ICD-10-CM

## 2020-04-25 DIAGNOSIS — F321 Major depressive disorder, single episode, moderate: Secondary | ICD-10-CM | POA: Diagnosis not present

## 2020-04-25 DIAGNOSIS — Z8673 Personal history of transient ischemic attack (TIA), and cerebral infarction without residual deficits: Secondary | ICD-10-CM

## 2020-04-25 DIAGNOSIS — K2100 Gastro-esophageal reflux disease with esophagitis, without bleeding: Secondary | ICD-10-CM

## 2020-04-25 DIAGNOSIS — Z20822 Contact with and (suspected) exposure to covid-19: Secondary | ICD-10-CM | POA: Diagnosis not present

## 2020-04-25 DIAGNOSIS — I10 Essential (primary) hypertension: Secondary | ICD-10-CM

## 2020-04-25 DIAGNOSIS — E1121 Type 2 diabetes mellitus with diabetic nephropathy: Secondary | ICD-10-CM | POA: Diagnosis not present

## 2020-04-25 DIAGNOSIS — E782 Mixed hyperlipidemia: Secondary | ICD-10-CM

## 2020-04-25 LAB — BAYER DCA HB A1C WAIVED: HB A1C (BAYER DCA - WAIVED): 6.8 % (ref <7.0)

## 2020-04-25 MED ORDER — METFORMIN HCL 500 MG PO TABS: ORAL_TABLET | ORAL | 1 refills | Status: DC

## 2020-04-25 MED ORDER — LOSARTAN POTASSIUM 100 MG PO TABS: 100.0000 mg | ORAL_TABLET | Freq: Every day | ORAL | 1 refills | Status: DC

## 2020-04-25 MED ORDER — GLIPIZIDE ER 5 MG PO TB24: 5.0000 mg | ORAL_TABLET | Freq: Every day | ORAL | 1 refills | Status: DC

## 2020-04-25 MED ORDER — SPIRONOLACTONE 25 MG PO TABS: 25.0000 mg | ORAL_TABLET | Freq: Every day | ORAL | 1 refills | Status: DC

## 2020-04-25 MED ORDER — ALBUTEROL SULFATE HFA 108 (90 BASE) MCG/ACT IN AERS: 2.0000 | INHALATION_SPRAY | Freq: Four times a day (QID) | RESPIRATORY_TRACT | 2 refills | Status: DC | PRN

## 2020-04-25 MED ORDER — SPIRIVA RESPIMAT 2.5 MCG/ACT IN AERS: 2.0000 | INHALATION_SPRAY | Freq: Every day | RESPIRATORY_TRACT | 2 refills | Status: DC

## 2020-04-25 MED ORDER — HYDRALAZINE HCL 50 MG PO TABS: ORAL_TABLET | ORAL | 1 refills | Status: DC

## 2020-04-25 MED ORDER — CLONIDINE HCL 0.2 MG PO TABS: 0.2000 mg | ORAL_TABLET | Freq: Two times a day (BID) | ORAL | 1 refills | Status: DC

## 2020-04-25 MED ORDER — AMLODIPINE BESYLATE 10 MG PO TABS: 1.0000 | ORAL_TABLET | Freq: Every day | ORAL | 1 refills | Status: DC

## 2020-04-25 MED ORDER — CLOPIDOGREL BISULFATE 75 MG PO TABS: 1.0000 | ORAL_TABLET | Freq: Every day | ORAL | 1 refills | Status: DC

## 2020-04-25 MED ORDER — ATORVASTATIN CALCIUM 10 MG PO TABS: 10.0000 mg | ORAL_TABLET | Freq: Every day | ORAL | 1 refills | Status: DC

## 2020-04-25 MED ORDER — PANTOPRAZOLE SODIUM 40 MG PO TBEC: 40.0000 mg | DELAYED_RELEASE_TABLET | Freq: Every day | ORAL | 1 refills | Status: DC

## 2020-04-25 MED ORDER — MIRTAZAPINE 15 MG PO TABS: 15.0000 mg | ORAL_TABLET | Freq: Every day | ORAL | 1 refills | Status: DC

## 2020-04-25 NOTE — Progress Notes (Signed)
Assessment & Plan:  1. Type 2 diabetes mellitus without complication, without long-term current use of insulin (HCC) Lab Results  Component Value Date   HGBA1C 6.8 04/25/2020   HGBA1C 7.7 (H) 01/29/2020   HGBA1C 6.1 07/10/2019    - Diabetes is at goal of A1c < 7. - Medications: continue current medications - Home glucose monitoring: Continue monitoring - Patient is currently taking a statin. Patient is taking an ACE-inhibitor/ARB.  - Instruction/counseling given: discussed foot care  Diabetes Health Maintenance Due  Topic Date Due  . OPHTHALMOLOGY EXAM  07/20/2020  . HEMOGLOBIN A1C  10/25/2020  . FOOT EXAM  04/25/2021    Lab Results  Component Value Date   LABMICR 30.7 01/29/2020   LABMICR 41.4 04/09/2019   - Lipid panel - CBC with Differential/Platelet - CMP14+EGFR - Bayer DCA Hb A1c Waived - atorvastatin (LIPITOR) 10 MG tablet; Take 1 tablet (10 mg total) by mouth daily at 6 PM.  Dispense: 90 tablet; Refill: 1 - losartan (COZAAR) 100 MG tablet; Take 1 tablet (100 mg total) by mouth daily.  Dispense: 90 tablet; Refill: 1 - glipiZIDE (GLUCOTROL XL) 5 MG 24 hr tablet; Take 1 tablet (5 mg total) by mouth daily.  Dispense: 90 tablet; Refill: 1 - metFORMIN (GLUCOPHAGE) 500 MG tablet; Take 1,000 mg (2 tablets) by mouth twice daily with meals.  Dispense: 360 tablet; Refill: 1 - TRUE METRIX BLOOD GLUCOSE TEST test strip; 1 each by Other route 4 (four) times daily as needed.  2. Diabetic nephropathy associated with type 2 diabetes mellitus ([place]) Diabetes is well controlled.  Patient is on an ARB.  3. Essential hypertension Patient to monitor blood pressure at home. - Lipid panel - CBC with Differential/Platelet - CMP14+EGFR - amLODipine (NORVASC) 10 MG tablet; Take 1 tablet (10 mg total) by mouth daily.  Dispense: 90 tablet; Refill: 1 - cloNIDine (CATAPRES) 0.2 MG tablet; Take 1 tablet (0.2 mg total) by mouth 2 (two) times daily.  Dispense: 180 tablet; Refill: 1 - hydrALAZINE  (APRESOLINE) 50 MG tablet; TAKE 1 TAB IN MORNING, 2 TABS IN AFTERNOON AND 2 TABS IN EVENING AS DIRECTED.  Dispense: 150 tablet; Refill: 1 - losartan (COZAAR) 100 MG tablet; Take 1 tablet (100 mg total) by mouth daily.  Dispense: 90 tablet; Refill: 1 - spironolactone (ALDACTONE) 25 MG tablet; Take 1 tablet (25 mg total) by mouth daily.  Dispense: 90 tablet; Refill: 1  4. Mixed hyperlipidemia Labs to assess. - Lipid panel - CMP14+EGFR - atorvastatin (LIPITOR) 10 MG tablet; Take 1 tablet (10 mg total) by mouth daily at 6 PM.  Dispense: 90 tablet; Refill: 1  5. Atherosclerosis of aorta (HCC) Continue statin. - Lipid panel - CMP14+EGFR - atorvastatin (LIPITOR) 10 MG tablet; Take 1 tablet (10 mg total) by mouth daily at 6 PM.  Dispense: 90 tablet; Refill: 1  6. History of CVA in adulthood Continue statin and Plavix. - atorvastatin (LIPITOR) 10 MG tablet; Take 1 tablet (10 mg total) by mouth daily at 6 PM.  Dispense: 90 tablet; Refill: 1 - clopidogrel (PLAVIX) 75 MG tablet; Take 1 tablet (75 mg total) by mouth daily.  Dispense: 90 tablet; Refill: 1  7. Chronic diastolic CHF (congestive heart failure) (HCC) Well controlled on current regimen.  - Lipid panel - CBC with Differential/Platelet - CMP14+EGFR - amLODipine (NORVASC) 10 MG tablet; Take 1 tablet (10 mg total) by mouth daily.  Dispense: 90 tablet; Refill: 1  8. Moderate major depression ([place]) Well controlled on current regimen.  -  CMP14+EGFR - mirtazapine (REMERON) 15 MG tablet; Take 1 tablet (15 mg total) by mouth at bedtime.  Dispense: 90 tablet; Refill: 1  9. Difficulty sleeping Well controlled on current regimen.  - CMP14+EGFR - mirtazapine (REMERON) 15 MG tablet; Take 1 tablet (15 mg total) by mouth at bedtime.  Dispense: 90 tablet; Refill: 1  10. Gastroesophageal reflux disease with esophagitis without hemorrhage Well controlled on current regimen.  - CMP14+EGFR - pantoprazole (PROTONIX) 40 MG tablet; Take 1 tablet (40 mg  total) by mouth daily.  Dispense: 90 tablet; Refill: 1  11. Pulmonary emphysema, unspecified emphysema type ([place]) Emphysema noted on chest CT.  Discussed with patient this explains her shortness of breath.  Rx'd Spiriva and albuterol. - Tiotropium Bromide Monohydrate (SPIRIVA RESPIMAT) 2.5 MCG/ACT AERS; Inhale 2 puffs into the lungs daily.  Dispense: 4 g; Refill: 2 - albuterol (VENTOLIN HFA) 108 (90 Base) MCG/ACT inhaler; Inhale 2 puffs into the lungs every 6 (six) hours as needed.  Dispense: 18 g; Refill: 2   Return in about 3 months (around 07/25/2020) for follow-up of chronic medication conditions.  Hendricks Limes, MSN, APRN, FNP-C [facility]  Subjective:    Patient ID: Heather Oconnor, female    DOB: 08-May-1940, 80 y.o.   MRN: [id number]  Patient Care Team: Loman Brooklyn, FNP as PCP - General (Family Medicine) Minus Breeding, MD as PCP - Cardiology (Cardiology) Gala Romney Cristopher Estimable, MD as Consulting Physician (Gastroenterology) Bond, Tracie Harrier, MD as Consulting Physician (Ophthalmology)   Chief Complaint:  Chief Complaint  Patient presents with  . Diabetes  . Hypertension    3 month follow up  . Shortness of Breath    Patient states that it has been going on awhile.     HPI: Heather Oconnor is a 80 y.o. female presenting on 04/25/2020 for Diabetes, Hypertension (3 month follow up), and Shortness of Breath (Patient states that it has been going on awhile. )  Diabetes: Patient presents for follow up of diabetes. Current symptoms include: hyperglycemia. Known diabetic complications: nephropathy, cardiovascular disease and cerebrovascular disease. Medication compliance: yes. Current diet: in general, a "healthy" diet  . Current exercise: none. Home blood sugar records: BGs are running  consistent with Hgb A1C. Is she  on ACE inhibitor or angiotensin II receptor blocker? Yes. Is she on a statin? Yes.   Aortic atherosclerosis: Taking  statin.  Hypertension: Patient reports systolic readings at home are generally in the 150s.  New complaints: Patient is requesting COVID-19 antibody testing.  She reports shortness of breath with exertion.  She has been using a spirometer at home to help her lung function.  Denies any weight gain or swelling.  She does sleep on 2 pillows at night but that is not a change as she has been doing that for very long time.  She is a former tobacco user, but quit smoking 15 years ago.  Social history:  Relevant past medical, surgical, family and social history reviewed and updated as indicated. Interim medical history since our last visit reviewed.  Allergies and medications reviewed and updated.  DATA REVIEWED: CHART IN EPIC  ROS: Negative unless specifically indicated above in HPI.    Current Outpatient Medications:  .  acetaminophen (TYLENOL) 500 MG tablet, Take 2 tablets (1,000 mg total) by mouth every 6 (six) hours as needed., Disp: 12 tablet, Rfl: 0 .  amLODipine (NORVASC) 10 MG tablet, TAKE 1 TABLET EVERY DAY, Disp: 90 tablet, Rfl: 0 .  atorvastatin (LIPITOR)  10 MG tablet, Take 1 tablet (10 mg total) by mouth daily at 6 PM., Disp: 90 tablet, Rfl: 1 .  blood glucose meter kit and supplies KIT, Dispense based on patient and insurance preference. Use up to four times daily as directed. (FOR ICD-9 250.00, 250.01)., Disp: 1 each, Rfl: 0 .  Blood Glucose Monitoring Suppl (TRUE METRIX AIR GLUCOSE METER) w/Device KIT, Test BS QID Dx E11.9, Disp: 1 kit, Rfl: 0 .  cloNIDine (CATAPRES) 0.2 MG tablet, Take 1 tablet (0.2 mg total) by mouth 2 (two) times daily., Disp: 180 tablet, Rfl: 1 .  clopidogrel (PLAVIX) 75 MG tablet, TAKE 1 TABLET EVERY DAY, Disp: 90 tablet, Rfl: 0 .  dorzolamide-timolol (COSOPT) 22.3-6.8 MG/ML ophthalmic solution, Place 1 drop into both eyes 2 (two) times daily., Disp: , Rfl:  .  glipiZIDE (GLUCOTROL XL) 5 MG 24 hr tablet, TAKE 1 TABLET EVERY DAY, Disp: 90 tablet, Rfl: 0 .   hydrALAZINE (APRESOLINE) 50 MG tablet, TAKE 1 TAB IN MORNING, 2 TABS IN AFTERNOON AND 2 TABS IN EVENING AS DIRECTED (NEED APPOINTMENT FOR FURTHER REFILLS), Disp: 150 tablet, Rfl: 0 .  losartan (COZAAR) 100 MG tablet, Take 1 tablet (100 mg total) by mouth daily., Disp: 90 tablet, Rfl: 1 .  metFORMIN (GLUCOPHAGE) 500 MG tablet, TAKE 1 TABLET EVERY DAY WITH BREAKFAST  AND TAKE 2 TABLETS EVERY DAY WITH SUPPER, Disp: 270 tablet, Rfl: 0 .  mirtazapine (REMERON) 15 MG tablet, Take 1 tablet (15 mg total) by mouth at bedtime., Disp: 90 tablet, Rfl: 1 .  pantoprazole (PROTONIX) 40 MG tablet, Take 1 tablet (40 mg total) by mouth daily., Disp: 90 tablet, Rfl: 1 .  spironolactone (ALDACTONE) 25 MG tablet, Take 1 tablet (25 mg total) by mouth daily., Disp: 90 tablet, Rfl: 0 .  TRUEplus Lancets 33G MISC, Test BS up to four times daily as needed Dx E11.9, Disp: 400 each, Rfl: 3   Allergies  Allergen Reactions  . Hctz [Hydrochlorothiazide]     Per GI may have caused pancreatitis. Dr. Ardis Hughs recommended never resuming.  . Lisinopril     H/O pancreatitis.   Past Medical History:  Diagnosis Date  . Blind right eye   . Cholecystitis   . Cirrhosis ([place])   . Diabetes ([place])   . Diabetic nephropathy ([place]) 02/04/2020  . GERD (gastroesophageal reflux disease)   . Hemorrhoids   . Hypertension   . IBS (irritable bowel syndrome)   . Pancreatitis   . Transient cerebral ischemia 05/08/2008   Qualifier: Diagnosis of  By: Zeb Comfort      Past Surgical History:  Procedure Laterality Date  . COLONOSCOPY  09/07/2007   RMR: anal canal/external hemorrhoids, redundant colon, left-sided diverticula, otherwise normal colonic mucosa    Social History   Socioeconomic History  . Marital status: Married    Spouse name: Not on file  . Number of children: Not on file  . Years of education: Not on file  . Highest education level: Not on file  Occupational History  . Not on file  Tobacco Use  . Smoking status: Former  Smoker    Years: 9.00    Quit date: 11/27/2006    Years since quitting: 13.4  . Smokeless tobacco: Never Used  Vaping Use  . Vaping Use: Never used  Substance and Sexual Activity  . Alcohol use: No    Alcohol/week: 0.0 standard drinks  . Drug use: No  . Sexual activity: Not Currently  Other Topics Concern  . Not  on file  Social History Narrative   Lives with husband.  Three children.     Social Determinants of Health   Financial Resource Strain: Not on file  Food Insecurity: Not on file  Transportation Needs: Not on file  Physical Activity: Not on file  Stress: Not on file  Social Connections: Not on file  Intimate Partner Violence: Not on file        Objective:    BP 132/71   Pulse 80   Temp (!) 97.3 F (36.3 C) (Temporal)   Ht '5\' 4"'  (1.626 m)   Wt 159 lb 9.6 oz (72.4 kg)   SpO2 97%   BMI 27.40 kg/m   Wt Readings from Last 3 Encounters:  04/25/20 159 lb 9.6 oz (72.4 kg)  07/10/19 155 lb 6.4 oz (70.5 kg)  04/09/19 157 lb (71.2 kg)    Physical Exam Vitals reviewed.  Constitutional:      General: She is not in acute distress.    Appearance: Normal appearance. She is not ill-appearing, toxic-appearing or diaphoretic.  HENT:     Head: Normocephalic and atraumatic.  Eyes:     General: No scleral icterus.       Right eye: No discharge.        Left eye: No discharge.     Conjunctiva/sclera: Conjunctivae normal.  Cardiovascular:     Rate and Rhythm: Normal rate and regular rhythm.     Heart sounds: Normal heart sounds. No murmur heard. No friction rub. No gallop.   Pulmonary:     Effort: Pulmonary effort is normal. No respiratory distress.     Breath sounds: Normal breath sounds. No stridor. No wheezing, rhonchi or rales.  Musculoskeletal:        General: Normal range of motion.     Cervical back: Normal range of motion.  Skin:    General: Skin is warm and dry.     Capillary Refill: Capillary refill takes less than 2 seconds.  Neurological:      General: No focal deficit present.     Mental Status: She is alert and oriented to person, place, and time. Mental status is at baseline.  Psychiatric:        Mood and Affect: Mood normal.        Behavior: Behavior normal.        Thought Content: Thought content normal.        Judgment: Judgment normal.    Diabetic Foot Exam - Simple   Simple Foot Form Diabetic Foot exam was performed with the following findings: Yes 04/25/2020  9:00 AM  Visual Inspection No deformities, no ulcerations, no other skin breakdown bilaterally: Yes Sensation Testing Intact to touch and monofilament testing bilaterally: Yes Pulse Check Posterior Tibialis and Dorsalis pulse intact bilaterally: Yes Comments     Lab Results  Component Value Date   TSH 1.820 11/02/2018   Lab Results  Component Value Date   WBC 10.2 01/29/2020   HGB 15.1 01/29/2020   HCT 46.0 01/29/2020   MCV 87 01/29/2020   PLT 221 01/29/2020   Lab Results  Component Value Date   NA 144 01/29/2020   K 3.9 01/29/2020   CO2 24 01/29/2020   GLUCOSE 166 (H) 01/29/2020   BUN 9 01/29/2020   CREATININE 0.89 01/29/2020   BILITOT 0.5 01/29/2020   ALKPHOS 60 01/29/2020   AST 14 01/29/2020   ALT 14 01/29/2020   PROT 7.6 01/29/2020   ALBUMIN 4.7 01/29/2020   CALCIUM 10.0  01/29/2020   ANIONGAP 9 07/14/2018   Lab Results  Component Value Date   CHOL 162 01/29/2020   Lab Results  Component Value Date   HDL 32 (L) 01/29/2020   Lab Results  Component Value Date   LDLCALC 85 01/29/2020   Lab Results  Component Value Date   TRIG 268 (H) 01/29/2020   Lab Results  Component Value Date   CHOLHDL 5.1 (H) 01/29/2020   Lab Results  Component Value Date   HGBA1C 7.7 (H) 01/29/2020

## 2020-04-25 NOTE — Patient Instructions (Signed)
Chronic Obstructive Pulmonary Disease  Chronic obstructive pulmonary disease (COPD) is a long-term (chronic) lung problem. When you have COPD, it is hard for air to get in and out of your lungs. Usually the condition gets worse over time, and your lungs will never return to normal. There are things you can do to keep yourself as healthy as possible. What are the causes?  Smoking. This is the most common cause.  Certain genes passed from parent to child (inherited). What increases the risk?  Being exposed to secondhand smoke from cigarettes, pipes, or cigars.  Being exposed to chemicals and other irritants, such as fumes and dust in the work environment.  Having chronic lung conditions or infections. What are the signs or symptoms?  Shortness of breath, especially during physical activity.  A long-term cough with a large amount of thick mucus. Sometimes, the cough may not have any mucus (dry cough).  Wheezing.  Breathing quickly.  Skin that looks gray or blue, especially in the fingers, toes, or lips.  Feeling tired (fatigue).  Weight loss.  Chest tightness.  Having infections often.  Episodes when breathing symptoms become much worse (exacerbations). At the later stages of this disease, you may have swelling in the ankles, feet, or legs. How is this treated?  Taking medicines.  Quitting smoking, if you smoke.  Rehabilitation. This includes steps to make your body work better. It may involve a team of specialists.  Doing exercises.  Making changes to your diet.  Using oxygen.  Lung surgery.  Lung transplant.  Comfort measures (palliative care). Follow these instructions at home: Medicines  Take over-the-counter and prescription medicines only as told by your doctor.  Talk to your doctor before taking any cough or allergy medicines. You may need to avoid medicines that cause your lungs to be dry. Lifestyle  If you smoke, stop smoking. Smoking makes the  problem worse.  Do not smoke or use any products that contain nicotine or tobacco. If you need help quitting, ask your doctor.  Avoid being around things that make your breathing worse. This may include smoke, chemicals, and fumes.  Stay active, but remember to rest as well.  Learn and use tips on how to manage stress and control your breathing.  Make sure you get enough sleep. Most adults need at least 7 hours of sleep every night.  Eat healthy foods. Eat smaller meals more often. Rest before meals. Controlled breathing Learn and use tips on how to control your breathing as told by your doctor. Try:  Breathing in (inhaling) through your nose for 1 second. Then, pucker your lips and breath out (exhale) through your lips for 2 seconds.  Putting one hand on your belly (abdomen). Breathe in slowly through your nose for 1 second. Your hand on your belly should move out. Pucker your lips and breathe out slowly through your lips. Your hand on your belly should move in as you breathe out.   Controlled coughing Learn and use controlled coughing to clear mucus from your lungs. Follow these steps: 1. Lean your head a little forward. 2. Breathe in deeply. 3. Try to hold your breath for 3 seconds. 4. Keep your mouth slightly open while coughing 2 times. 5. Spit any mucus out into a tissue. 6. Rest and do the steps again 1 or 2 times as needed. General instructions  Make sure you get all the shots (vaccines) that your doctor recommends. Ask your doctor about a flu shot and a pneumonia shot.    Use oxygen therapy and pulmonary rehabilitation if told by your doctor. If you need home oxygen therapy, ask your doctor if you should buy a tool to measure your oxygen level (oximeter).  Make a COPD action plan with your doctor. This helps you to know what to do if you feel worse than usual.  Manage any other conditions you have as told by your doctor.  Avoid going outside when it is very hot, cold, or  humid.  Avoid people who have a sickness you can catch (contagious).  Keep all follow-up visits. Contact a doctor if:  You cough up more mucus than usual.  There is a change in the color or thickness of the mucus.  It is harder to breathe than usual.  Your breathing is faster than usual.  You have trouble sleeping.  You need to use your medicines more often than usual.  You have trouble doing your normal activities such as getting dressed or walking around the house. Get help right away if:  You have shortness of breath while resting.  You have shortness of breath that stops you from: ? Being able to talk. ? Doing normal activities.  Your chest hurts for longer than 5 minutes.  Your skin color is more blue than usual.  Your pulse oximeter shows that you have low oxygen for longer than 5 minutes.  You have a fever.  You feel too tired to breathe normally. These symptoms may represent a serious problem that is an emergency. Do not wait to see if the symptoms will go away. Get medical help right away. Call your local emergency services (911 in the [place] Do not drive yourself to the hospital. Summary  Chronic obstructive pulmonary disease (COPD) is a long-term lung problem.  The way your lungs work will never return to normal. Usually the condition gets worse over time. There are things you can do to keep yourself as healthy as possible.  Take over-the-counter and prescription medicines only as told by your doctor.  If you smoke, stop. Smoking makes the problem worse. This information is not intended to replace advice given to you by your health care provider. Make sure you discuss any questions you have with your health care provider. Document Revised: 10/30/2019 Document Reviewed: 10/30/2019 Elsevier Patient Education  2021 Elsevier Inc.   

## 2020-04-26 LAB — LIPID PANEL
Chol/HDL Ratio: 4.9 ratio — ABNORMAL HIGH (ref 0.0–4.4)
Cholesterol, Total: 162 mg/dL (ref 100–199)
HDL: 33 mg/dL — ABNORMAL LOW (ref >39)
LDL Chol Calc (NIH): 98 mg/dL (ref 0–99)
Triglycerides: 175 mg/dL — ABNORMAL HIGH (ref 0–149)
VLDL Cholesterol Cal: 31 mg/dL (ref 5–40)

## 2020-04-26 LAB — CBC WITH DIFFERENTIAL/PLATELET
Basophils Absolute: 0.1 10*3/uL (ref 0.0–0.2)
Basos: 1 %
EOS (ABSOLUTE): 0.3 10*3/uL (ref 0.0–0.4)
Eos: 3 %
Hematocrit: 42.7 % (ref 34.0–46.6)
Hemoglobin: 14.3 g/dL (ref 11.1–15.9)
Immature Grans (Abs): 0 10*3/uL (ref 0.0–0.1)
Immature Granulocytes: 0 %
Lymphocytes Absolute: 1.7 10*3/uL (ref 0.7–3.1)
Lymphs: 18 %
MCH: 28.9 pg (ref 26.6–33.0)
MCHC: 33.5 g/dL (ref 31.5–35.7)
MCV: 86 fL (ref 79–97)
Monocytes Absolute: 0.6 10*3/uL (ref 0.1–0.9)
Monocytes: 7 %
Neutrophils Absolute: 6.8 10*3/uL (ref 1.4–7.0)
Neutrophils: 71 %
Platelets: 218 10*3/uL (ref 150–450)
RBC: 4.95 x10E6/uL (ref 3.77–5.28)
RDW: 13.3 % (ref 11.7–15.4)
WBC: 9.5 10*3/uL (ref 3.4–10.8)

## 2020-04-26 LAB — CMP14+EGFR
ALT: 14 IU/L (ref 0–32)
AST: 17 IU/L (ref 0–40)
Albumin/Globulin Ratio: 1.8 (ref 1.2–2.2)
Albumin: 4.8 g/dL — ABNORMAL HIGH (ref 3.7–4.7)
Alkaline Phosphatase: 48 IU/L (ref 44–121)
BUN/Creatinine Ratio: 13 (ref 12–28)
BUN: 11 mg/dL (ref 8–27)
Bilirubin Total: 0.6 mg/dL (ref 0.0–1.2)
CO2: 24 mmol/L (ref 20–29)
Calcium: 10.4 mg/dL — ABNORMAL HIGH (ref 8.7–10.3)
Chloride: 99 mmol/L (ref 96–106)
Creatinine, Ser: 0.88 mg/dL (ref 0.57–1.00)
Globulin, Total: 2.6 g/dL (ref 1.5–4.5)
Glucose: 203 mg/dL — ABNORMAL HIGH (ref 65–99)
Potassium: 5.1 mmol/L (ref 3.5–5.2)
Sodium: 143 mmol/L (ref 134–144)
Total Protein: 7.4 g/dL (ref 6.0–8.5)
eGFR: 67 mL/min/{1.73_m2} (ref >59)

## 2020-04-28 ENCOUNTER — Encounter: Payer: Self-pay | Admitting: Family Medicine

## 2020-04-29 LAB — SARS-COV-2 SPIKE AB DILUTION: SARS-CoV-2 Spike Ab Dilution: 644 U/mL (ref <0.8)

## 2020-04-29 LAB — SARS-COV-2 SEMI-QUANTITATIVE TOTAL ANTIBODY, SPIKE: SARS-CoV-2 Spike Ab Interp: POSITIVE

## 2020-04-29 LAB — SPECIMEN STATUS REPORT

## 2020-05-16 ENCOUNTER — Encounter: Payer: Self-pay | Admitting: Family Medicine

## 2020-05-16 ENCOUNTER — Ambulatory Visit (INDEPENDENT_AMBULATORY_CARE_PROVIDER_SITE_OTHER): Payer: Medicare HMO | Admitting: Family Medicine

## 2020-05-16 DIAGNOSIS — J441 Chronic obstructive pulmonary disease with (acute) exacerbation: Secondary | ICD-10-CM

## 2020-05-16 MED ORDER — METHYLPREDNISOLONE 4 MG PO TBPK: ORAL_TABLET | ORAL | 0 refills | Status: DC

## 2020-05-16 MED ORDER — AMOXICILLIN-POT CLAVULANATE 875-125 MG PO TABS: 1.0000 | ORAL_TABLET | Freq: Two times a day (BID) | ORAL | 0 refills | Status: AC

## 2020-05-16 NOTE — Progress Notes (Signed)
Virtual Visit via Telephone Note  I connected with Heather Oconnor on 05/16/20 at 8:20 AM by telephone and verified that I am speaking with the correct person using two identifiers. Heather Oconnor is currently located at home and nobody is currently with her during this visit. The provider, Gwenlyn Fudge, FNP is located in their office at time of visit.  I discussed the limitations, risks, security and privacy concerns of performing an evaluation and management service by telephone and the availability of in person appointments. I also discussed with the patient that there may be a patient responsible charge related to this service. The patient expressed understanding and agreed to proceed.  Subjective: PCP: Gwenlyn Fudge, FNP  Chief Complaint  Patient presents with  . URI   Patient complains of cough, chest congestion, shortness of breath and wheezing. Onset of symptoms was 1 week ago, unchanged since that time. She is drinking plenty of fluids. Evaluation to date: none. Treatment to date: Tylenol and Albuterol. She has a history of COPD. She does not smoke.    ROS: Per HPI  Current Outpatient Medications:  .  acetaminophen (TYLENOL) 500 MG tablet, Take 2 tablets (1,000 mg total) by mouth every 6 (six) hours as needed., Disp: 12 tablet, Rfl: 0 .  albuterol (VENTOLIN HFA) 108 (90 Base) MCG/ACT inhaler, Inhale 2 puffs into the lungs every 6 (six) hours as needed., Disp: 18 g, Rfl: 2 .  amLODipine (NORVASC) 10 MG tablet, Take 1 tablet (10 mg total) by mouth daily., Disp: 90 tablet, Rfl: 1 .  atorvastatin (LIPITOR) 10 MG tablet, Take 1 tablet (10 mg total) by mouth daily at 6 PM., Disp: 90 tablet, Rfl: 1 .  cloNIDine (CATAPRES) 0.2 MG tablet, Take 1 tablet (0.2 mg total) by mouth 2 (two) times daily., Disp: 180 tablet, Rfl: 1 .  clopidogrel (PLAVIX) 75 MG tablet, Take 1 tablet (75 mg total) by mouth daily., Disp: 90 tablet, Rfl: 1 .  dorzolamide-timolol (COSOPT) 22.3-6.8 MG/ML  ophthalmic solution, Place 1 drop into both eyes 2 (two) times daily., Disp: , Rfl:  .  glipiZIDE (GLUCOTROL XL) 5 MG 24 hr tablet, Take 1 tablet (5 mg total) by mouth daily., Disp: 90 tablet, Rfl: 1 .  hydrALAZINE (APRESOLINE) 50 MG tablet, TAKE 1 TAB IN MORNING, 2 TABS IN AFTERNOON AND 2 TABS IN EVENING AS DIRECTED., Disp: 150 tablet, Rfl: 1 .  losartan (COZAAR) 100 MG tablet, Take 1 tablet (100 mg total) by mouth daily., Disp: 90 tablet, Rfl: 1 .  metFORMIN (GLUCOPHAGE) 500 MG tablet, Take 1,000 mg (2 tablets) by mouth twice daily with meals., Disp: 360 tablet, Rfl: 1 .  mirtazapine (REMERON) 15 MG tablet, Take 1 tablet (15 mg total) by mouth at bedtime., Disp: 90 tablet, Rfl: 1 .  pantoprazole (PROTONIX) 40 MG tablet, Take 1 tablet (40 mg total) by mouth daily., Disp: 90 tablet, Rfl: 1 .  spironolactone (ALDACTONE) 25 MG tablet, Take 1 tablet (25 mg total) by mouth daily., Disp: 90 tablet, Rfl: 1 .  Tiotropium Bromide Monohydrate (SPIRIVA RESPIMAT) 2.5 MCG/ACT AERS, Inhale 2 puffs into the lungs daily., Disp: 4 g, Rfl: 2 .  TRUE METRIX BLOOD GLUCOSE TEST test strip, 1 each by Other route 4 (four) times daily as needed., Disp: , Rfl:  .  TRUEplus Lancets 33G MISC, Test BS up to four times daily as needed Dx E11.9, Disp: 400 each, Rfl: 3  Allergies  Allergen Reactions  . Hctz [Hydrochlorothiazide]  Per GI may have caused pancreatitis. Dr. Christella Hartigan recommended never resuming.  . Lisinopril     H/O pancreatitis.   Past Medical History:  Diagnosis Date  . Blind right eye   . Cholecystitis   . Cirrhosis (HCC)   . Diabetes (HCC)   . Diabetic nephropathy (HCC) 02/04/2020  . GERD (gastroesophageal reflux disease)   . Hemorrhoids   . Hypertension   . IBS (irritable bowel syndrome)   . Pancreatitis   . Transient cerebral ischemia 05/08/2008   Qualifier: Diagnosis of  By: Diana Eves      Observations/Objective: A&O  No respiratory distress or wheezing audible over the phone Mood,  judgement, and thought processes all WNL  Assessment and Plan: 1. COPD exacerbation (HCC) - amoxicillin-clavulanate (AUGMENTIN) 875-125 MG tablet; Take 1 tablet by mouth 2 (two) times daily for 7 days.  Dispense: 14 tablet; Refill: 0 - methylPREDNISolone (MEDROL DOSEPAK) 4 MG TBPK tablet; Use as directed.  Dispense: 21 each; Refill: 0   Follow Up Instructions:  I discussed the assessment and treatment plan with the patient. The patient was provided an opportunity to ask questions and all were answered. The patient agreed with the plan and demonstrated an understanding of the instructions.   The patient was advised to call back or seek an in-person evaluation if the symptoms worsen or if the condition fails to improve as anticipated.  The above assessment and management plan was discussed with the patient. The patient verbalized understanding of and has agreed to the management plan. Patient is aware to call the clinic if symptoms persist or worsen. Patient is aware when to return to the clinic for a follow-up visit. Patient educated on when it is appropriate to go to the emergency department.   Time call ended: 8:26 PM  I provided 6 minutes of non-face-to-face time during this encounter.  Deliah Boston, MSN, APRN, FNP-C [facility] 05/16/20

## 2020-07-14 DIAGNOSIS — H44511 Absolute glaucoma, right eye: Secondary | ICD-10-CM | POA: Diagnosis not present

## 2020-07-23 ENCOUNTER — Other Ambulatory Visit: Payer: Self-pay

## 2020-07-23 ENCOUNTER — Encounter: Payer: Self-pay | Admitting: Family Medicine

## 2020-07-23 ENCOUNTER — Ambulatory Visit (INDEPENDENT_AMBULATORY_CARE_PROVIDER_SITE_OTHER): Payer: Medicare HMO | Admitting: Family Medicine

## 2020-07-23 VITALS — BP 147/74 | HR 77 | Temp 97.3°F | Ht 64.0 in | Wt 156.0 lb

## 2020-07-23 DIAGNOSIS — G479 Sleep disorder, unspecified: Secondary | ICD-10-CM

## 2020-07-23 DIAGNOSIS — I700 Atherosclerosis of aorta: Secondary | ICD-10-CM

## 2020-07-23 DIAGNOSIS — I10 Essential (primary) hypertension: Secondary | ICD-10-CM | POA: Diagnosis not present

## 2020-07-23 DIAGNOSIS — J439 Emphysema, unspecified: Secondary | ICD-10-CM | POA: Diagnosis not present

## 2020-07-23 DIAGNOSIS — E119 Type 2 diabetes mellitus without complications: Secondary | ICD-10-CM

## 2020-07-23 DIAGNOSIS — F321 Major depressive disorder, single episode, moderate: Secondary | ICD-10-CM

## 2020-07-23 LAB — BAYER DCA HB A1C WAIVED: HB A1C (BAYER DCA - WAIVED): 6.3 % (ref <7.0)

## 2020-07-23 MED ORDER — HYDRALAZINE HCL 100 MG PO TABS: 100.0000 mg | ORAL_TABLET | Freq: Three times a day (TID) | ORAL | 1 refills | Status: DC

## 2020-07-23 NOTE — Progress Notes (Signed)
Assessment & Plan:  1. Type 2 diabetes mellitus without complication, without long-term current use of insulin (HCC) Lab Results  Component Value Date   HGBA1C 6.3 07/23/2020   HGBA1C 6.8 04/25/2020   HGBA1C 7.7 (H) 01/29/2020    - Diabetes is at goal of A1c < 7. - Medications: continue current medications - Home glucose monitoring: continue monitoring - Patient is currently taking a statin. Patient is taking an ACE-inhibitor/ARB.   Diabetes Health Maintenance Due  Topic Date Due   OPHTHALMOLOGY EXAM  07/20/2020   HEMOGLOBIN A1C  01/23/2021   FOOT EXAM  04/25/2021    - Lipid panel - CBC with Differential/Platelet - CMP14+EGFR - Bayer DCA Hb A1c Waived  2. Essential hypertension Elevated. Increased hydralazine to 100 mg TID. - Lipid panel - CBC with Differential/Platelet - CMP14+EGFR - hydrALAZINE (APRESOLINE) 100 MG tablet; Take 1 tablet (100 mg total) by mouth 3 (three) times daily.  Dispense: 270 tablet; Refill: 1  3. Atherosclerosis of aorta ([place]) On a statin. - Lipid panel  4. Pulmonary emphysema, unspecified emphysema type ([place]) Patient is going to stop using Spiriva. Discussed if use of Albuterol increase to more than twice a week, she needs to resume it.  5-6. Moderate major depression (HCC)/Difficulty sleeping Well controlled on current regimen. Decrease Remeron to every other day x2 weeks, then stop.  - CMP14+EGFR   Return in about 3 months (around 10/23/2020) for annual physical.  Hendricks Limes, MSN, APRN, FNP-C [facility]  Subjective:    Patient ID: Heather Oconnor, female    DOB: [birth year], 80 y.o.   MRN: [id number]  Patient Care Team: Loman Brooklyn, FNP as PCP - General (Family Medicine) Minus Breeding, MD as PCP - Cardiology (Cardiology) Gala Romney Cristopher Estimable, MD as Consulting Physician (Gastroenterology) Bond, Tracie Harrier, MD as Consulting Physician (Ophthalmology)   Chief Complaint:  Chief Complaint  Patient  presents with   Hypertension   Diabetes    3 month follow up of chronic medical conditions     HPI: Heather Oconnor is a 80 y.o. female presenting on 07/23/2020 for Hypertension and Diabetes (3 month follow up of chronic medical conditions )  Diabetes: Patient presents for follow up of diabetes. Current symptoms include: hyperglycemia. Known diabetic complications: nephropathy, cardiovascular disease and cerebrovascular disease. Medication compliance: yes. Current diet: in general, a "healthy" diet  . Current exercise: none. Home blood sugar records:  160s most of the time when she checks . Is she  on ACE inhibitor or angiotensin II receptor blocker? Yes. Is she on a statin? Yes.   Aortic atherosclerosis: Taking statin.  Hypertension: Patient reports systolic readings at home are generally 130-140s in the morning and 150-160s in the afternoon.   COPD: Patient has not been using Spiriva for the past month and has not required her Albuterol.   Depression/Difficulty sleeping: patient would like to try to come off the Remeron she was started on when her husband passed away a year and a half ago. She is currently taking 7.5 mg at bedtime.   Depression screen [facility] 2/9 07/23/2020 07/23/2020 04/25/2020  Decreased Interest 0 0 0  Down, Depressed, Hopeless 0 0 0  PHQ - 2 Score 0 0 0  Altered sleeping 0 - 0  Tired, decreased energy 3 - 3  Change in appetite 0 - 0  Feeling bad or failure about yourself  0 - 0  Trouble concentrating 0 - 0  Moving slowly or fidgety/restless 0 - 0  Suicidal thoughts 0 - 0  PHQ-9 Score 3 - 3  Difficult doing work/chores - - Not difficult at all    New complaints: None   Social history:  Relevant past medical, surgical, family and social history reviewed and updated as indicated. Interim medical history since our last visit reviewed.  Allergies and medications reviewed and updated.  DATA REVIEWED: CHART IN EPIC  ROS: Negative unless specifically indicated  above in HPI.    Current Outpatient Medications:    acetaminophen (TYLENOL) 500 MG tablet, Take 2 tablets (1,000 mg total) by mouth every 6 (six) hours as needed., Disp: 12 tablet, Rfl: 0   albuterol (VENTOLIN HFA) 108 (90 Base) MCG/ACT inhaler, Inhale 2 puffs into the lungs every 6 (six) hours as needed., Disp: 18 g, Rfl: 2   amLODipine (NORVASC) 10 MG tablet, Take 1 tablet (10 mg total) by mouth daily., Disp: 90 tablet, Rfl: 1   atorvastatin (LIPITOR) 10 MG tablet, Take 1 tablet (10 mg total) by mouth daily at 6 PM., Disp: 90 tablet, Rfl: 1   cloNIDine (CATAPRES) 0.2 MG tablet, Take 1 tablet (0.2 mg total) by mouth 2 (two) times daily., Disp: 180 tablet, Rfl: 1   clopidogrel (PLAVIX) 75 MG tablet, Take 1 tablet (75 mg total) by mouth daily., Disp: 90 tablet, Rfl: 1   glipiZIDE (GLUCOTROL XL) 5 MG 24 hr tablet, Take 1 tablet (5 mg total) by mouth daily., Disp: 90 tablet, Rfl: 1   hydrALAZINE (APRESOLINE) 50 MG tablet, TAKE 1 TAB IN MORNING, 2 TABS IN AFTERNOON AND 2 TABS IN EVENING AS DIRECTED., Disp: 150 tablet, Rfl: 1   losartan (COZAAR) 100 MG tablet, Take 1 tablet (100 mg total) by mouth daily., Disp: 90 tablet, Rfl: 1   metFORMIN (GLUCOPHAGE) 500 MG tablet, Take 1,000 mg (2 tablets) by mouth twice daily with meals., Disp: 360 tablet, Rfl: 1   mirtazapine (REMERON) 15 MG tablet, Take 1 tablet (15 mg total) by mouth at bedtime., Disp: 90 tablet, Rfl: 1   pantoprazole (PROTONIX) 40 MG tablet, Take 1 tablet (40 mg total) by mouth daily., Disp: 90 tablet, Rfl: 1   spironolactone (ALDACTONE) 25 MG tablet, Take 1 tablet (25 mg total) by mouth daily., Disp: 90 tablet, Rfl: 1   Tiotropium Bromide Monohydrate (SPIRIVA RESPIMAT) 2.5 MCG/ACT AERS, Inhale 2 puffs into the lungs daily., Disp: 4 g, Rfl: 2   TRUE METRIX BLOOD GLUCOSE TEST test strip, 1 each by Other route 4 (four) times daily as needed., Disp: , Rfl:    TRUEplus Lancets 33G MISC, Test BS up to four times daily as needed Dx E11.9, Disp: 400  each, Rfl: 3   Allergies  Allergen Reactions   Hctz [Hydrochlorothiazide]     Per GI may have caused pancreatitis. Dr. Ardis Hughs recommended never resuming.   Lisinopril     H/O pancreatitis.   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[place])    Diabetes ([place])    Diabetic nephropathy ([place]) 02/04/2020   GERD (gastroesophageal reflux disease)    Hemorrhoids    Hypertension    IBS (irritable bowel syndrome)    Pancreatitis    Transient cerebral ischemia 05/08/2008   Qualifier: Diagnosis of  By: Zeb Comfort      Past Surgical History:  Procedure Laterality Date   COLONOSCOPY  09/07/2007   RMR: anal canal/external hemorrhoids, redundant colon, left-sided diverticula, otherwise normal colonic mucosa    Social History  Socioeconomic History   Marital status: Married    Spouse name: Not on file   Number of children: Not on file   Years of education: Not on file   Highest education level: Not on file  Occupational History   Not on file  Tobacco Use   Smoking status: Former    Years: 9.00    Types: Cigarettes    Quit date: 11/27/2006    Years since quitting: 13.6   Smokeless tobacco: Never  Vaping Use   Vaping Use: Never used  Substance and Sexual Activity   Alcohol use: No    Alcohol/week: 0.0 standard drinks   Drug use: No   Sexual activity: Not Currently  Other Topics Concern   Not on file  Social History Narrative   Lives with husband.  Three children.     Social Determinants of Health   Financial Resource Strain: Not on file  Food Insecurity: Not on file  Transportation Needs: Not on file  Physical Activity: Not on file  Stress: Not on file  Social Connections: Not on file  Intimate Partner Violence: Not on file        Objective:    BP (!) 147/74   Pulse 77   Temp (!) 97.3 F (36.3 C) (Temporal)   Ht '5\' 4"'  (1.626 m)   Wt 156 lb (70.8 kg)   SpO2 97%   BMI 26.78 kg/m   Wt Readings from Last 3  Encounters:  07/23/20 156 lb (70.8 kg)  04/25/20 159 lb 9.6 oz (72.4 kg)  07/10/19 155 lb 6.4 oz (70.5 kg)    Physical Exam Vitals reviewed.  Constitutional:      General: She is not in acute distress.    Appearance: Normal appearance. She is overweight. She is not ill-appearing, toxic-appearing or diaphoretic.  HENT:     Head: Normocephalic and atraumatic.  Eyes:     General: No scleral icterus.       Right eye: No discharge.        Left eye: No discharge.     Conjunctiva/sclera: Conjunctivae normal.  Cardiovascular:     Rate and Rhythm: Normal rate and regular rhythm.     Heart sounds: Normal heart sounds. No murmur heard.   No friction rub. No gallop.  Pulmonary:     Effort: Pulmonary effort is normal. No respiratory distress.     Breath sounds: Normal breath sounds. No stridor. No wheezing, rhonchi or rales.  Musculoskeletal:        General: Normal range of motion.     Cervical back: Normal range of motion.  Skin:    General: Skin is warm and dry.     Capillary Refill: Capillary refill takes less than 2 seconds.  Neurological:     General: No focal deficit present.     Mental Status: She is alert and oriented to person, place, and time. Mental status is at baseline.  Psychiatric:        Mood and Affect: Mood normal.        Behavior: Behavior normal.        Thought Content: Thought content normal.        Judgment: Judgment normal.    Lab Results  Component Value Date   TSH 1.820 11/02/2018   Lab Results  Component Value Date   WBC 9.5 04/25/2020   HGB 14.3 04/25/2020   HCT 42.7 04/25/2020   MCV 86 04/25/2020   PLT 218 04/25/2020   Lab Results  Component Value Date   NA 143 04/25/2020   K 5.1 04/25/2020   CO2 24 04/25/2020   GLUCOSE 203 (H) 04/25/2020   BUN 11 04/25/2020   CREATININE 0.88 04/25/2020   BILITOT 0.6 04/25/2020   ALKPHOS 48 04/25/2020   AST 17 04/25/2020   ALT 14 04/25/2020   PROT 7.4 04/25/2020   ALBUMIN 4.8 (H) 04/25/2020   CALCIUM  10.4 (H) 04/25/2020   ANIONGAP 9 07/14/2018   EGFR 67 04/25/2020   Lab Results  Component Value Date   CHOL 162 04/25/2020   Lab Results  Component Value Date   HDL 33 (L) 04/25/2020   Lab Results  Component Value Date   LDLCALC 98 04/25/2020   Lab Results  Component Value Date   TRIG 175 (H) 04/25/2020   Lab Results  Component Value Date   CHOLHDL 4.9 (H) 04/25/2020   Lab Results  Component Value Date   HGBA1C 6.8 04/25/2020

## 2020-07-23 NOTE — Patient Instructions (Signed)
Decrease mirtazapine (Remeron) to every other day x2 weeks, then stop it.

## 2020-07-24 LAB — CBC WITH DIFFERENTIAL/PLATELET
Basophils Absolute: 0.1 10*3/uL (ref 0.0–0.2)
Basos: 1 %
EOS (ABSOLUTE): 0.3 10*3/uL (ref 0.0–0.4)
Eos: 3 %
Hematocrit: 42.2 % (ref 34.0–46.6)
Hemoglobin: 14.1 g/dL (ref 11.1–15.9)
Immature Grans (Abs): 0 10*3/uL (ref 0.0–0.1)
Immature Granulocytes: 0 %
Lymphocytes Absolute: 1.9 10*3/uL (ref 0.7–3.1)
Lymphs: 17 %
MCH: 28.5 pg (ref 26.6–33.0)
MCHC: 33.4 g/dL (ref 31.5–35.7)
MCV: 85 fL (ref 79–97)
Monocytes Absolute: 0.6 10*3/uL (ref 0.1–0.9)
Monocytes: 6 %
Neutrophils Absolute: 8 10*3/uL — ABNORMAL HIGH (ref 1.4–7.0)
Neutrophils: 73 %
Platelets: 193 10*3/uL (ref 150–450)
RBC: 4.94 x10E6/uL (ref 3.77–5.28)
RDW: 13.4 % (ref 11.7–15.4)
WBC: 10.9 10*3/uL — ABNORMAL HIGH (ref 3.4–10.8)

## 2020-07-24 LAB — CMP14+EGFR
ALT: 13 IU/L (ref 0–32)
AST: 14 IU/L (ref 0–40)
Albumin/Globulin Ratio: 2.2 (ref 1.2–2.2)
Albumin: 5 g/dL — ABNORMAL HIGH (ref 3.7–4.7)
Alkaline Phosphatase: 47 IU/L (ref 44–121)
BUN/Creatinine Ratio: 13 (ref 12–28)
BUN: 9 mg/dL (ref 8–27)
Bilirubin Total: 0.5 mg/dL (ref 0.0–1.2)
CO2: 27 mmol/L (ref 20–29)
Calcium: 10.1 mg/dL (ref 8.7–10.3)
Chloride: 101 mmol/L (ref 96–106)
Creatinine, Ser: 0.71 mg/dL (ref 0.57–1.00)
Globulin, Total: 2.3 g/dL (ref 1.5–4.5)
Glucose: 173 mg/dL — ABNORMAL HIGH (ref 65–99)
Potassium: 4.8 mmol/L (ref 3.5–5.2)
Sodium: 140 mmol/L (ref 134–144)
Total Protein: 7.3 g/dL (ref 6.0–8.5)
eGFR: 86 mL/min/{1.73_m2} (ref >59)

## 2020-07-24 LAB — LIPID PANEL
Chol/HDL Ratio: 4.7 ratio — ABNORMAL HIGH (ref 0.0–4.4)
Cholesterol, Total: 145 mg/dL (ref 100–199)
HDL: 31 mg/dL — ABNORMAL LOW (ref >39)
LDL Chol Calc (NIH): 74 mg/dL (ref 0–99)
Triglycerides: 239 mg/dL — ABNORMAL HIGH (ref 0–149)
VLDL Cholesterol Cal: 40 mg/dL (ref 5–40)

## 2020-08-01 ENCOUNTER — Other Ambulatory Visit: Payer: Self-pay | Admitting: Family Medicine

## 2020-08-01 DIAGNOSIS — F321 Major depressive disorder, single episode, moderate: Secondary | ICD-10-CM

## 2020-08-01 DIAGNOSIS — E119 Type 2 diabetes mellitus without complications: Secondary | ICD-10-CM

## 2020-08-01 DIAGNOSIS — I700 Atherosclerosis of aorta: Secondary | ICD-10-CM

## 2020-08-01 DIAGNOSIS — Z8673 Personal history of transient ischemic attack (TIA), and cerebral infarction without residual deficits: Secondary | ICD-10-CM

## 2020-08-01 DIAGNOSIS — E782 Mixed hyperlipidemia: Secondary | ICD-10-CM

## 2020-08-01 DIAGNOSIS — G479 Sleep disorder, unspecified: Secondary | ICD-10-CM

## 2020-09-10 ENCOUNTER — Telehealth: Payer: Self-pay | Admitting: Family Medicine

## 2020-09-10 NOTE — Telephone Encounter (Signed)
Pt called stating that when she had her last visit with Brayton El, she asked if it was ok to come off of her Mirtazapine Rx and was told that it was ok. Pt says she has been off of the medicine for 2-3 weeks but says she has not been able to sleep since she stopped taking it. Pt would like to start taking the medicine again. Wants to know if that is ok?   Please advise and call patient.

## 2020-09-11 NOTE — Telephone Encounter (Signed)
That is absolutely okay. Does she need a refill?

## 2020-09-11 NOTE — Addendum Note (Signed)
Addended by: Fara Olden on: 09/11/2020 11:59 AM   Modules accepted: Orders

## 2020-10-16 ENCOUNTER — Other Ambulatory Visit: Payer: Self-pay | Admitting: Family Medicine

## 2020-10-16 DIAGNOSIS — E119 Type 2 diabetes mellitus without complications: Secondary | ICD-10-CM

## 2020-10-23 ENCOUNTER — Ambulatory Visit (INDEPENDENT_AMBULATORY_CARE_PROVIDER_SITE_OTHER): Payer: Medicare HMO | Admitting: Family Medicine

## 2020-10-23 ENCOUNTER — Other Ambulatory Visit: Payer: Self-pay

## 2020-10-23 ENCOUNTER — Encounter: Payer: Self-pay | Admitting: Family Medicine

## 2020-10-23 VITALS — BP 150/70 | HR 76 | Temp 97.8°F | Ht 64.0 in | Wt 156.6 lb

## 2020-10-23 DIAGNOSIS — K2100 Gastro-esophageal reflux disease with esophagitis, without bleeding: Secondary | ICD-10-CM | POA: Diagnosis not present

## 2020-10-23 DIAGNOSIS — I5032 Chronic diastolic (congestive) heart failure: Secondary | ICD-10-CM | POA: Diagnosis not present

## 2020-10-23 DIAGNOSIS — E119 Type 2 diabetes mellitus without complications: Secondary | ICD-10-CM | POA: Diagnosis not present

## 2020-10-23 DIAGNOSIS — Z0001 Encounter for general adult medical examination with abnormal findings: Secondary | ICD-10-CM | POA: Diagnosis not present

## 2020-10-23 DIAGNOSIS — E1165 Type 2 diabetes mellitus with hyperglycemia: Secondary | ICD-10-CM | POA: Diagnosis not present

## 2020-10-23 DIAGNOSIS — J449 Chronic obstructive pulmonary disease, unspecified: Secondary | ICD-10-CM

## 2020-10-23 DIAGNOSIS — Z0000 Encounter for general adult medical examination without abnormal findings: Secondary | ICD-10-CM

## 2020-10-23 DIAGNOSIS — G479 Sleep disorder, unspecified: Secondary | ICD-10-CM

## 2020-10-23 DIAGNOSIS — Z8673 Personal history of transient ischemic attack (TIA), and cerebral infarction without residual deficits: Secondary | ICD-10-CM | POA: Diagnosis not present

## 2020-10-23 DIAGNOSIS — I10 Essential (primary) hypertension: Secondary | ICD-10-CM

## 2020-10-23 DIAGNOSIS — I700 Atherosclerosis of aorta: Secondary | ICD-10-CM | POA: Diagnosis not present

## 2020-10-23 DIAGNOSIS — K7460 Unspecified cirrhosis of liver: Secondary | ICD-10-CM | POA: Diagnosis not present

## 2020-10-23 DIAGNOSIS — E782 Mixed hyperlipidemia: Secondary | ICD-10-CM | POA: Diagnosis not present

## 2020-10-23 DIAGNOSIS — F321 Major depressive disorder, single episode, moderate: Secondary | ICD-10-CM

## 2020-10-23 LAB — LIPID PANEL
Chol/HDL Ratio: 4.9 ratio — ABNORMAL HIGH (ref 0.0–4.4)
Cholesterol, Total: 156 mg/dL (ref 100–199)
HDL: 32 mg/dL — ABNORMAL LOW (ref >39)
LDL Chol Calc (NIH): 87 mg/dL (ref 0–99)
Triglycerides: 220 mg/dL — ABNORMAL HIGH (ref 0–149)
VLDL Cholesterol Cal: 37 mg/dL (ref 5–40)

## 2020-10-23 LAB — CBC WITH DIFFERENTIAL/PLATELET
Basophils Absolute: 0.1 10*3/uL (ref 0.0–0.2)
Basos: 1 %
EOS (ABSOLUTE): 0.2 10*3/uL (ref 0.0–0.4)
Eos: 3 %
Hematocrit: 39.2 % (ref 34.0–46.6)
Hemoglobin: 13.7 g/dL (ref 11.1–15.9)
Immature Grans (Abs): 0 10*3/uL (ref 0.0–0.1)
Immature Granulocytes: 0 %
Lymphocytes Absolute: 1.7 10*3/uL (ref 0.7–3.1)
Lymphs: 21 %
MCH: 29.2 pg (ref 26.6–33.0)
MCHC: 34.9 g/dL (ref 31.5–35.7)
MCV: 84 fL (ref 79–97)
Monocytes Absolute: 0.6 10*3/uL (ref 0.1–0.9)
Monocytes: 8 %
Neutrophils Absolute: 5.6 10*3/uL (ref 1.4–7.0)
Neutrophils: 67 %
Platelets: 202 10*3/uL (ref 150–450)
RBC: 4.69 x10E6/uL (ref 3.77–5.28)
RDW: 13.8 % (ref 11.7–15.4)
WBC: 8.3 10*3/uL (ref 3.4–10.8)

## 2020-10-23 LAB — CMP14+EGFR
ALT: 12 IU/L (ref 0–32)
AST: 17 IU/L (ref 0–40)
Albumin/Globulin Ratio: 2 (ref 1.2–2.2)
Albumin: 4.6 g/dL (ref 3.7–4.7)
Alkaline Phosphatase: 45 IU/L (ref 44–121)
BUN/Creatinine Ratio: 17 (ref 12–28)
BUN: 15 mg/dL (ref 8–27)
Bilirubin Total: 0.6 mg/dL (ref 0.0–1.2)
CO2: 25 mmol/L (ref 20–29)
Calcium: 9.5 mg/dL (ref 8.7–10.3)
Chloride: 101 mmol/L (ref 96–106)
Creatinine, Ser: 0.88 mg/dL (ref 0.57–1.00)
Globulin, Total: 2.3 g/dL (ref 1.5–4.5)
Glucose: 137 mg/dL — ABNORMAL HIGH (ref 70–99)
Potassium: 4.2 mmol/L (ref 3.5–5.2)
Sodium: 142 mmol/L (ref 134–144)
Total Protein: 6.9 g/dL (ref 6.0–8.5)
eGFR: 66 mL/min/{1.73_m2} (ref >59)

## 2020-10-23 LAB — BAYER DCA HB A1C WAIVED: HB A1C (BAYER DCA - WAIVED): 6 % — ABNORMAL HIGH (ref 4.8–5.6)

## 2020-10-23 MED ORDER — HYDRALAZINE HCL 100 MG PO TABS: 100.0000 mg | ORAL_TABLET | Freq: Three times a day (TID) | ORAL | 1 refills | Status: DC

## 2020-10-23 MED ORDER — GLIPIZIDE ER 5 MG PO TB24: 5.0000 mg | ORAL_TABLET | Freq: Every day | ORAL | 1 refills | Status: DC

## 2020-10-23 MED ORDER — SPIRONOLACTONE 25 MG PO TABS: 25.0000 mg | ORAL_TABLET | Freq: Two times a day (BID) | ORAL | 1 refills | Status: DC

## 2020-10-23 MED ORDER — ATORVASTATIN CALCIUM 10 MG PO TABS: ORAL_TABLET | ORAL | 1 refills | Status: DC

## 2020-10-23 MED ORDER — LOSARTAN POTASSIUM 100 MG PO TABS: 100.0000 mg | ORAL_TABLET | Freq: Every day | ORAL | 1 refills | Status: DC

## 2020-10-23 MED ORDER — CLOPIDOGREL BISULFATE 75 MG PO TABS: 75.0000 mg | ORAL_TABLET | Freq: Every day | ORAL | 1 refills | Status: DC

## 2020-10-23 MED ORDER — ALBUTEROL SULFATE HFA 108 (90 BASE) MCG/ACT IN AERS: 2.0000 | INHALATION_SPRAY | Freq: Four times a day (QID) | RESPIRATORY_TRACT | 2 refills | Status: DC | PRN

## 2020-10-23 MED ORDER — AMLODIPINE BESYLATE 10 MG PO TABS: 10.0000 mg | ORAL_TABLET | Freq: Every day | ORAL | 1 refills | Status: DC

## 2020-10-23 MED ORDER — PANTOPRAZOLE SODIUM 40 MG PO TBEC: 40.0000 mg | DELAYED_RELEASE_TABLET | Freq: Every day | ORAL | 1 refills | Status: DC

## 2020-10-23 MED ORDER — MIRTAZAPINE 15 MG PO TABS: 15.0000 mg | ORAL_TABLET | Freq: Every day | ORAL | 1 refills | Status: DC

## 2020-10-23 MED ORDER — METFORMIN HCL 500 MG PO TABS: 1000.0000 mg | ORAL_TABLET | Freq: Two times a day (BID) | ORAL | 1 refills | Status: DC

## 2020-10-23 MED ORDER — CLONIDINE HCL 0.2 MG PO TABS: 0.2000 mg | ORAL_TABLET | Freq: Two times a day (BID) | ORAL | 1 refills | Status: DC

## 2020-10-23 NOTE — Progress Notes (Signed)
Assessment & Plan:  1. Well adult exam - preventative health information provided - Lipid panel - CBC with Differential/Platelet - CMP14+EGFR  2. Type 2 diabetes mellitus with hyperglycemia, without long-term current use of insulin (HCC) Lab Results  Component Value Date   HGBA1C 6.0 (H) 10/23/2020   HGBA1C 6.3 07/23/2020   HGBA1C 6.8 04/25/2020    - Diabetes is at goal of A1c < 7. - Medications: continue current medications - Home glucose monitoring: continue monitoring - Patient is currently taking a statin. Patient is taking an ACE-inhibitor/ARB.  - Instruction/counseling given: reminded to get eye exam  Diabetes Health Maintenance Due  Topic Date Due   OPHTHALMOLOGY EXAM  07/20/2020   HEMOGLOBIN A1C  04/23/2021   FOOT EXAM  04/25/2021    Lab Results  Component Value Date   LABMICR 30.7 01/29/2020   LABMICR 41.4 04/09/2019   - atorvastatin (LIPITOR) 10 MG tablet; TAKE 1 TABLET EVERY DAY AT 6PM  Dispense: 90 tablet; Refill: 1 - glipiZIDE (GLUCOTROL XL) 5 MG 24 hr tablet; Take 1 tablet (5 mg total) by mouth daily.  Dispense: 90 tablet; Refill: 1 - losartan (COZAAR) 100 MG tablet; Take 1 tablet (100 mg total) by mouth daily.  Dispense: 90 tablet; Refill: 1 - metFORMIN (GLUCOPHAGE) 500 MG tablet; Take 2 tablets (1,000 mg total) by mouth 2 (two) times daily with a meal.  Dispense: 360 tablet; Refill: 1 - Lipid panel - CBC with Differential/Platelet - CMP14+EGFR - Bayer DCA Hb A1c Waived  3. Essential hypertension - uncontrolled; increase spironolactone from 25 mg QD to 25 mg BID - Lipid panel - CBC with Differential/Platelet - CMP14+EGFR - amLODipine (NORVASC) 10 MG tablet; Take 1 tablet (10 mg total) by mouth daily.  Dispense: 90 tablet; Refill: 1 - cloNIDine (CATAPRES) 0.2 MG tablet; Take 1 tablet (0.2 mg total) by mouth 2 (two) times daily.  Dispense: 180 tablet; Refill: 1 - hydrALAZINE (APRESOLINE) 100 MG tablet; Take 1 tablet (100 mg total) by mouth 3 (three)  times daily.  Dispense: 270 tablet; Refill: 1 - losartan (COZAAR) 100 MG tablet; Take 1 tablet (100 mg total) by mouth daily.  Dispense: 90 tablet; Refill: 1 - spironolactone (ALDACTONE) 25 MG tablet; Take 1 tablet (25 mg total) by mouth 2 (two) times daily.  Dispense: 180 tablet; Refill: 1  4. Chronic diastolic CHF (congestive heart failure) (HCC) - Well controlled on current regimen.  - amLODipine (NORVASC) 10 MG tablet; Take 1 tablet (10 mg total) by mouth daily.  Dispense: 90 tablet; Refill: 1 - Lipid panel - CBC with Differential/Platelet - CMP14+EGFR  5. Atherosclerosis of aorta (HCC) - continue statin - atorvastatin (LIPITOR) 10 MG tablet; TAKE 1 TABLET EVERY DAY AT 6PM  Dispense: 90 tablet; Refill: 1 - Lipid panel  6. Mixed hyperlipidemia - encouraged healthy diet and exercise - Lipid panel - atorvastatin (LIPITOR) 10 MG tablet; TAKE 1 TABLET EVERY DAY AT 6PM  Dispense: 90 tablet; Refill: 1  7. History of CVA in adulthood Continue statin and Plavix - atorvastatin (LIPITOR) 10 MG tablet; TAKE 1 TABLET EVERY DAY AT 6PM  Dispense: 90 tablet; Refill: 1 - clopidogrel (PLAVIX) 75 MG tablet; Take 1 tablet (75 mg total) by mouth daily.  Dispense: 90 tablet; Refill: 1  8. Gastroesophageal reflux disease with esophagitis without hemorrhage - Well controlled on current regimen - pantoprazole (PROTONIX) 40 MG tablet; Take 1 tablet (40 mg total) by mouth daily.  Dispense: 90 tablet; Refill: 1 - CMP14+EGFR  9. COPD without  exacerbation ([place]) - encouraged to fill albuterol and keep on hand in the event of respiratory distress - Spiriva discontinued since patient is not taking - albuterol (VENTOLIN HFA) 108 (90 Base) MCG/ACT inhaler; Inhale 2 puffs into the lungs every 6 (six) hours as needed.  Dispense: 18 g; Refill: 2  10. Difficulty sleeping - Well controlled on current regimen - continue mirtazapine - CMP14+EGFR  11. Moderate major depression ([place]) - Well controlled on current  regimen - CMP14+EGFR   Follow-up: Return in about 6 weeks (around 12/04/2020) for HTN.   Lucile Crater, NP Student  I personally was present during the history, physical exam, and medical decision-making activities of this service and have verified that the service and findings are accurately documented in the nurse practitioner student's note.  Hendricks Limes, MSN, APRN, FNP-C [facility]   Subjective:  Patient ID: Heather Oconnor, female    DOB: Mar 23, 1940  Age: 80 y.o. MRN: [id number]  Patient Care Team: Loman Brooklyn, FNP as PCP - General (Family Medicine) Minus Breeding, MD as PCP - Cardiology (Cardiology) Gala Romney Cristopher Estimable, MD as Consulting Physician (Gastroenterology) Bond, Tracie Harrier, MD as Consulting Physician (Ophthalmology)   CC:  Chief Complaint  Patient presents with   Annual Exam    HPI Heather Oconnor presents for annual physical and follow up for chronic medical conditions.   Diabetes: Patient presents for follow up of diabetes. Current symptoms include: hyperglycemia. Known diabetic complications: retinopathy and cardiovascular disease. Medication compliance: yes. Current diet:  normal diet, does not add salt, does not try to eat healthy specifically . Current exercise: none. Home blood sugar records: BGs are high in the morning, > 160s most mornings . Is she  on ACE inhibitor or angiotensin II receptor blocker? Yes. Is she on a statin? Yes.   COPD: She is not taking any medication for her COPD. She states she is doing well and is not having episodes of shortness of breath and uses an incentive spirometer to exercise her lungs. She does not want any respiratory medications ordered.   Hypertension: She takes her blood pressures at home and gets readings around 150s-160s/70s. She does not add salt to her food. She does not exercise.  Health Maintenance: Occupation: retired, Marital status: married, Substance use: none Diet: regular,  Exercise: none Last eye exam: July 2021 with Dr. Hassell Done in [place], scheduled for Dec 2022 Last dental exam: edentulous DEXA: declined Hepatitis C Screening: declined Immunizations: Flu Vaccine: declined Tdap Vaccine: declined  Shingrix Vaccine: declined  COVID-19 Vaccine: declined Pneumonia Vaccine: declined  Advanced Directives Patient does have advanced directives including healthcare power of attorney. She does not have a copy in the electronic medical record.   DEPRESSION SCREENING PHQ 2/9 Scores 10/23/2020 07/23/2020 07/23/2020 04/25/2020 01/17/2020 07/10/2019 04/09/2019  PHQ - 2 Score 0 0 0 0 1 2 -  PHQ- 9 Score 0 3 - '3 4 9 ' -  Exception Documentation - - - - - - Patient refusal     Review of Systems  Constitutional: Negative.  Negative for chills, fever, malaise/fatigue and weight loss.  HENT: Negative.  Negative for congestion, ear discharge, ear pain, hearing loss and sinus pain.   Eyes: Negative.  Negative for blurred vision, pain, discharge and redness.       Blind right eye  Respiratory: Negative.  Negative for cough, shortness of breath and wheezing.   Cardiovascular: Negative.  Negative for chest pain, palpitations, orthopnea and leg swelling.  Gastrointestinal: Negative.  Negative for abdominal pain, constipation, diarrhea, heartburn, nausea and vomiting.  Genitourinary: Negative.  Negative for dysuria, frequency and urgency.  Musculoskeletal: Negative.  Negative for back pain, falls, joint pain, myalgias and neck pain.       Occasional muscle cramping   Skin: Negative.  Negative for itching and rash.  Neurological: Negative.  Negative for dizziness, tremors, weakness and headaches.  Endo/Heme/Allergies: Negative.  Negative for environmental allergies and polydipsia. Does not bruise/bleed easily.  Psychiatric/Behavioral: Negative.  Negative for depression, memory loss and substance abuse. The patient is not nervous/anxious and does not have insomnia.     Current Outpatient  Medications:    acetaminophen (TYLENOL) 500 MG tablet, Take 2 tablets (1,000 mg total) by mouth every 6 (six) hours as needed., Disp: 12 tablet, Rfl: 0   albuterol (VENTOLIN HFA) 108 (90 Base) MCG/ACT inhaler, Inhale 2 puffs into the lungs every 6 (six) hours as needed., Disp: 18 g, Rfl: 2   mirtazapine (REMERON) 15 MG tablet, Take 1 tablet (15 mg total) by mouth at bedtime., Disp: 90 tablet, Rfl: 1   TRUE METRIX BLOOD GLUCOSE TEST test strip, 1 each by Other route 4 (four) times daily as needed., Disp: , Rfl:    TRUEplus Lancets 33G MISC, Test BS up to four times daily as needed Dx E11.9, Disp: 400 each, Rfl: 3   vitamin B-12 (CYANOCOBALAMIN) 1000 MCG tablet, Take 1,000 mcg by mouth daily., Disp: , Rfl:    amLODipine (NORVASC) 10 MG tablet, Take 1 tablet (10 mg total) by mouth daily., Disp: 90 tablet, Rfl: 1   atorvastatin (LIPITOR) 10 MG tablet, TAKE 1 TABLET EVERY DAY AT 6PM, Disp: 90 tablet, Rfl: 1   cloNIDine (CATAPRES) 0.2 MG tablet, Take 1 tablet (0.2 mg total) by mouth 2 (two) times daily., Disp: 180 tablet, Rfl: 1   clopidogrel (PLAVIX) 75 MG tablet, Take 1 tablet (75 mg total) by mouth daily., Disp: 90 tablet, Rfl: 1   glipiZIDE (GLUCOTROL XL) 5 MG 24 hr tablet, Take 1 tablet (5 mg total) by mouth daily., Disp: 90 tablet, Rfl: 1   hydrALAZINE (APRESOLINE) 100 MG tablet, Take 1 tablet (100 mg total) by mouth 3 (three) times daily., Disp: 270 tablet, Rfl: 1   losartan (COZAAR) 100 MG tablet, Take 1 tablet (100 mg total) by mouth daily., Disp: 90 tablet, Rfl: 1   metFORMIN (GLUCOPHAGE) 500 MG tablet, Take 2 tablets (1,000 mg total) by mouth 2 (two) times daily with a meal., Disp: 360 tablet, Rfl: 1   pantoprazole (PROTONIX) 40 MG tablet, Take 1 tablet (40 mg total) by mouth daily., Disp: 90 tablet, Rfl: 1   spironolactone (ALDACTONE) 25 MG tablet, Take 1 tablet (25 mg total) by mouth 2 (two) times daily., Disp: 180 tablet, Rfl: 1  Allergies  Allergen Reactions   Hctz [Hydrochlorothiazide]      Per GI may have caused pancreatitis. Dr. Ardis Hughs recommended never resuming.   Lisinopril     H/O pancreatitis.    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[place])    Diabetes ([place])    Diabetic nephropathy ([place]) 02/04/2020   GERD (gastroesophageal reflux disease)    Hemorrhoids    Hypertension    IBS (irritable bowel syndrome)    Pancreatitis    Transient cerebral ischemia 05/08/2008   Qualifier: Diagnosis of  By: Zeb Comfort      Past Surgical History:  Procedure Laterality Date   COLONOSCOPY  09/07/2007   RMR: anal canal/external hemorrhoids, redundant colon, left-sided diverticula, otherwise normal colonic mucosa    Family History  Problem Relation Age of Onset   Hypertension Mother    Hypertension Father    Hypertension Sister    Hypertension Brother    Hypertension Brother    Diabetes Son    Hypertension Son    Colon cancer Neg Hx     Social History   Socioeconomic History   Marital status: Married    Spouse name: Not on file   Number of children: Not on file   Years of education: Not on file   Highest education level: Not on file  Occupational History   Not on file  Tobacco Use   Smoking status: Former    Years: 9.00    Types: Cigarettes    Quit date: 11/27/2006    Years since quitting: 13.9   Smokeless tobacco: Never  Vaping Use   Vaping Use: Never used  Substance and Sexual Activity   Alcohol use: No    Alcohol/week: 0.0 standard drinks   Drug use: No   Sexual activity: Not Currently  Other Topics Concern   Not on file  Social History Narrative   Lives with husband.  Three children.     Social Determinants of Health   Financial Resource Strain: Not on file  Food Insecurity: Not on file  Transportation Needs: Not on file  Physical Activity: Not on file  Stress: Not on file  Social Connections: Not on file  Intimate Partner Violence: Not on file      Objective:    BP (!) 150/70    Pulse 76   Temp 97.8 F (36.6 C) (Temporal)   Ht '5\' 4"'  (1.626 m)   Wt 71 kg   SpO2 95%   BMI 26.88 kg/m   Wt Readings from Last 3 Encounters:  10/23/20 156 lb 9.6 oz (71 kg)  07/23/20 156 lb (70.8 kg)  04/25/20 159 lb 9.6 oz (72.4 kg)    Physical Exam Vitals reviewed.  Constitutional:      General: She is not in acute distress.    Appearance: Normal appearance. She is overweight. She is not ill-appearing, toxic-appearing or diaphoretic.  HENT:     Head: Normocephalic and atraumatic.     Right Ear: Tympanic membrane, ear canal and external ear normal.     Left Ear: Tympanic membrane, ear canal and external ear normal.     Nose: Nose normal. No congestion.     Mouth/Throat:     Mouth: Mucous membranes are moist.     Pharynx: Oropharynx is clear. No oropharyngeal exudate or posterior oropharyngeal erythema.  Eyes:     Extraocular Movements: Extraocular movements intact.     Conjunctiva/sclera: Conjunctivae normal.     Pupils: Pupils are equal, round, and reactive to light.  Cardiovascular:     Rate and Rhythm: Normal rate and regular rhythm.     Pulses: Normal pulses.     Heart sounds: Normal heart sounds.  Pulmonary:     Effort: Pulmonary effort is normal. No respiratory distress.     Breath sounds: Normal breath sounds. No wheezing or rhonchi.  Abdominal:     General: Bowel sounds are normal. There is no distension.     Palpations: Abdomen is soft. There is no mass.  Musculoskeletal:        General: Normal range of motion.     Cervical back: Normal range of motion.  Skin:  General: Skin is warm and dry.     Capillary Refill: Capillary refill takes less than 2 seconds.  Neurological:     General: No focal deficit present.     Mental Status: She is alert and oriented to person, place, and time.     Motor: No weakness.     Gait: Gait normal.  Psychiatric:        Mood and Affect: Mood normal.        Behavior: Behavior normal.        Thought Content: Thought  content normal.        Judgment: Judgment normal.    Lab Results  Component Value Date   TSH 1.820 11/02/2018   Lab Results  Component Value Date   WBC 10.9 (H) 07/23/2020   HGB 14.1 07/23/2020   HCT 42.2 07/23/2020   MCV 85 07/23/2020   PLT 193 07/23/2020   Lab Results  Component Value Date   NA 140 07/23/2020   K 4.8 07/23/2020   CO2 27 07/23/2020   GLUCOSE 173 (H) 07/23/2020   BUN 9 07/23/2020   CREATININE 0.71 07/23/2020   BILITOT 0.5 07/23/2020   ALKPHOS 47 07/23/2020   AST 14 07/23/2020   ALT 13 07/23/2020   PROT 7.3 07/23/2020   ALBUMIN 5.0 (H) 07/23/2020   CALCIUM 10.1 07/23/2020   ANIONGAP 9 07/14/2018   EGFR 86 07/23/2020   Lab Results  Component Value Date   CHOL 145 07/23/2020   Lab Results  Component Value Date   HDL 31 (L) 07/23/2020   Lab Results  Component Value Date   LDLCALC 74 07/23/2020   Lab Results  Component Value Date   TRIG 239 (H) 07/23/2020   Lab Results  Component Value Date   CHOLHDL 4.7 (H) 07/23/2020   Lab Results  Component Value Date   HGBA1C 6.3 07/23/2020

## 2020-10-25 ENCOUNTER — Other Ambulatory Visit: Payer: Self-pay | Admitting: Family Medicine

## 2020-10-25 DIAGNOSIS — E1165 Type 2 diabetes mellitus with hyperglycemia: Secondary | ICD-10-CM

## 2020-10-25 DIAGNOSIS — I5032 Chronic diastolic (congestive) heart failure: Secondary | ICD-10-CM

## 2020-10-25 DIAGNOSIS — I10 Essential (primary) hypertension: Secondary | ICD-10-CM

## 2020-10-25 DIAGNOSIS — Z8673 Personal history of transient ischemic attack (TIA), and cerebral infarction without residual deficits: Secondary | ICD-10-CM

## 2020-10-25 DIAGNOSIS — K2100 Gastro-esophageal reflux disease with esophagitis, without bleeding: Secondary | ICD-10-CM

## 2020-10-27 ENCOUNTER — Encounter: Payer: Self-pay | Admitting: Family Medicine

## 2020-11-03 ENCOUNTER — Telehealth: Payer: Self-pay | Admitting: Family Medicine

## 2020-11-03 DIAGNOSIS — I10 Essential (primary) hypertension: Secondary | ICD-10-CM

## 2020-11-03 MED ORDER — SPIRONOLACTONE 25 MG PO TABS: 25.0000 mg | ORAL_TABLET | Freq: Two times a day (BID) | ORAL | 1 refills | Status: DC

## 2020-11-03 NOTE — Telephone Encounter (Signed)
Pt aware Rx cancelled at [facility] & sent to [facility] pharmacy

## 2020-11-03 NOTE — Telephone Encounter (Signed)
Please void Spironolactone Rx at [facility] and send to [facility] mail order pharmacy.

## 2020-11-13 ENCOUNTER — Other Ambulatory Visit: Payer: Self-pay | Admitting: Family Medicine

## 2020-11-13 ENCOUNTER — Ambulatory Visit (INDEPENDENT_AMBULATORY_CARE_PROVIDER_SITE_OTHER): Payer: Medicare HMO

## 2020-11-13 VITALS — Ht 64.0 in | Wt 157.0 lb

## 2020-11-13 DIAGNOSIS — I10 Essential (primary) hypertension: Secondary | ICD-10-CM

## 2020-11-13 DIAGNOSIS — Z0000 Encounter for general adult medical examination without abnormal findings: Secondary | ICD-10-CM

## 2020-11-13 NOTE — Patient Instructions (Signed)
Heather Oconnor , Thank you for taking time to come for your Medicare Wellness Visit. I appreciate your ongoing commitment to your health goals. Please review the following plan we discussed and let me know if I can assist you in the future.   Screening recommendations/referrals: Colonoscopy: No longer required Mammogram: No longer required - declined Bone Density: Declined Recommended yearly ophthalmology/optometry visit for glaucoma screening and checkup Recommended yearly dental visit for hygiene and checkup  Vaccinations: Declines all vaccines Influenza vaccine: Due Pneumococcal vaccine: Due Tdap vaccine: Due Shingles vaccine: Due   Covid-19:Due  Advanced directives: Please bring a copy of your health care power of attorney and living will to the office to be added to your chart at your convenience.   Conditions/risks identified: Aim for 30 minutes of exercise or brisk walking each day, drink 6-8 glasses of water and eat lots of fruits and vegetables.   Next appointment: Follow up in one year for your annual wellness visit    Preventive Care 65 Years and Older, Female Preventive care refers to lifestyle choices and visits with your health care provider that can promote health and wellness. What does preventive care include? A yearly physical exam. This is also called an annual well check. Dental exams once or twice a year. Routine eye exams. Ask your health care provider how often you should have your eyes checked. Personal lifestyle choices, including: Daily care of your teeth and gums. Regular physical activity. Eating a healthy diet. Avoiding tobacco and drug use. Limiting alcohol use. Practicing safe sex. Taking low-dose aspirin every day. Taking vitamin and mineral supplements as recommended by your health care provider. What happens during an annual well check? The services and screenings done by your health care provider during your annual well check will depend on your  age, overall health, lifestyle risk factors, and family history of disease. Counseling  Your health care provider may ask you questions about your: Alcohol use. Tobacco use. Drug use. Emotional well-being. Home and relationship well-being. Sexual activity. Eating habits. History of falls. Memory and ability to understand (cognition). Work and work Astronomer. Reproductive health. Screening  You may have the following tests or measurements: Height, weight, and BMI. Blood pressure. Lipid and cholesterol levels. These may be checked every 5 years, or more frequently if you are over 80 years old. Skin check. Lung cancer screening. You may have this screening every year starting at age 78 if you have a 30-pack-year history of smoking and currently smoke or have quit within the past 15 years. Fecal occult blood test (FOBT) of the stool. You may have this test every year starting at age 80. Flexible sigmoidoscopy or colonoscopy. You may have a sigmoidoscopy every 5 years or a colonoscopy every 10 years starting at age 80. Hepatitis C blood test. Hepatitis B blood test. Sexually transmitted disease (STD) testing. Diabetes screening. This is done by checking your blood sugar (glucose) after you have not eaten for a while (fasting). You may have this done every 1-3 years. Bone density scan. This is done to screen for osteoporosis. You may have this done starting at age 56. Mammogram. This may be done every 1-2 years. Talk to your health care provider about how often you should have regular mammograms. Talk with your health care provider about your test results, treatment options, and if necessary, the need for more tests. Vaccines  Your health care provider may recommend certain vaccines, such as: Influenza vaccine. This is recommended every year. Tetanus, diphtheria, and  acellular pertussis (Tdap, Td) vaccine. You may need a Td booster every 10 years. Zoster vaccine. You may need this after  age 80. Pneumococcal 13-valent conjugate (PCV13) vaccine. One dose is recommended after age 43. Pneumococcal polysaccharide (PPSV23) vaccine. One dose is recommended after age 109. Talk to your health care provider about which screenings and vaccines you need and how often you need them. This information is not intended to replace advice given to you by your health care provider. Make sure you discuss any questions you have with your health care provider. Document Released: 01/17/2015 Document Revised: 09/10/2015 Document Reviewed: 10/22/2014 Elsevier Interactive Patient Education  2017 [employer] Prevention in the Home Falls can cause injuries. They can happen to people of all ages. There are many things you can do to make your home safe and to help prevent falls. What can I do on the outside of my home? Regularly fix the edges of walkways and driveways and fix any cracks. Remove anything that might make you trip as you walk through a door, such as a raised step or threshold. Trim any bushes or trees on the path to your home. Use bright outdoor lighting. Clear any walking paths of anything that might make someone trip, such as rocks or tools. Regularly check to see if handrails are loose or broken. Make sure that both sides of any steps have handrails. Any raised decks and porches should have guardrails on the edges. Have any leaves, snow, or ice cleared regularly. Use sand or salt on walking paths during winter. Clean up any spills in your garage right away. This includes oil or grease spills. What can I do in the bathroom? Use night lights. Install grab bars by the toilet and in the tub and shower. Do not use towel bars as grab bars. Use non-skid mats or decals in the tub or shower. If you need to sit down in the shower, use a plastic, non-slip stool. Keep the floor dry. Clean up any water that spills on the floor as soon as it happens. Remove soap buildup in the tub or shower  regularly. Attach bath mats securely with double-sided non-slip rug tape. Do not have throw rugs and other things on the floor that can make you trip. What can I do in the bedroom? Use night lights. Make sure that you have a light by your bed that is easy to reach. Do not use any sheets or blankets that are too big for your bed. They should not hang down onto the floor. Have a firm chair that has side arms. You can use this for support while you get dressed. Do not have throw rugs and other things on the floor that can make you trip. What can I do in the kitchen? Clean up any spills right away. Avoid walking on wet floors. Keep items that you use a lot in easy-to-reach places. If you need to reach something above you, use a strong step stool that has a grab bar. Keep electrical cords out of the way. Do not use floor polish or wax that makes floors slippery. If you must use wax, use non-skid floor wax. Do not have throw rugs and other things on the floor that can make you trip. What can I do with my stairs? Do not leave any items on the stairs. Make sure that there are handrails on both sides of the stairs and use them. Fix handrails that are broken or loose. Make sure that  handrails are as long as the stairways. Check any carpeting to make sure that it is firmly attached to the stairs. Fix any carpet that is loose or worn. Avoid having throw rugs at the top or bottom of the stairs. If you do have throw rugs, attach them to the floor with carpet tape. Make sure that you have a light switch at the top of the stairs and the bottom of the stairs. If you do not have them, ask someone to add them for you. What else can I do to help prevent falls? Wear shoes that: Do not have high heels. Have rubber bottoms. Are comfortable and fit you well. Are closed at the toe. Do not wear sandals. If you use a stepladder: Make sure that it is fully opened. Do not climb a closed stepladder. Make sure that  both sides of the stepladder are locked into place. Ask someone to hold it for you, if possible. Clearly mark and make sure that you can see: Any grab bars or handrails. First and last steps. Where the edge of each step is. Use tools that help you move around (mobility aids) if they are needed. These include: Canes. Walkers. Scooters. Crutches. Turn on the lights when you go into a dark area. Replace any light bulbs as soon as they burn out. Set up your furniture so you have a clear path. Avoid moving your furniture around. If any of your floors are uneven, fix them. If there are any pets around you, be aware of where they are. Review your medicines with your doctor. Some medicines can make you feel dizzy. This can increase your chance of falling. Ask your doctor what other things that you can do to help prevent falls. This information is not intended to replace advice given to you by your health care provider. Make sure you discuss any questions you have with your health care provider. Document Released: 10/17/2008 Document Revised: 05/29/2015 Document Reviewed: 01/25/2014 Elsevier Interactive Patient Education  2017 [employer].

## 2020-11-13 NOTE — Progress Notes (Signed)
Subjective:   Heather Oconnor is a 80 y.o. female who presents for Medicare Annual (Subsequent) preventive examination.  Virtual Visit via Telephone Note  I connected with  Heather Oconnor on 11/13/20 at 10:30 AM EST by telephone and verified that I am speaking with the correct person using two identifiers.  Location: Patient: Home Provider: [facility] Persons participating in the virtual visit: patient/Nurse Health Advisor   I discussed the limitations, risks, security and privacy concerns of performing an evaluation and management service by telephone and the availability of in person appointments. The patient expressed understanding and agreed to proceed.  Interactive audio and video telecommunications were attempted between this nurse and patient, however failed, due to patient having technical difficulties OR patient did not have access to video capability.  We continued and completed visit with audio only.  Some vital signs may be absent or patient reported.   Tereza Gilham E Leelynd Maldonado, LPN  Review of Systems     Cardiac Risk Factors include: advanced age (>21men, >56 women);diabetes mellitus;dyslipidemia;hypertension;sedentary lifestyle;Other (see comment), Risk factor comments: hx of CVA, CHF, atherosclerosis, COPD     Objective:    Today's Vitals   11/13/20 1041  Weight: 157 lb (71.2 kg)  Height: 5\' 4"  (1.626 m)   Body mass index is 26.95 kg/m.  Advanced Directives 11/13/2020 07/11/2018 07/04/2018 07/04/2018 06/12/2018 06/12/2018  Does Patient Have a Medical Advance Directive? Yes No No No No No  Type of 08/12/2018 of [place];Living will - - - - -  Copy of Healthcare Power of Attorney in Chart? No - copy requested - - - - -  Would patient like information on creating a medical advance directive? - No - Patient declined - - No - Patient declined No - Patient declined    Current Medications (verified) Outpatient Encounter Medications as of 11/13/2020   Medication Sig   acetaminophen (TYLENOL) 500 MG tablet Take 2 tablets (1,000 mg total) by mouth every 6 (six) hours as needed.   albuterol (VENTOLIN HFA) 108 (90 Base) MCG/ACT inhaler Inhale 2 puffs into the lungs every 6 (six) hours as needed.   amLODipine (NORVASC) 10 MG tablet TAKE 1 TABLET EVERY DAY   atorvastatin (LIPITOR) 10 MG tablet TAKE 1 TABLET EVERY DAY AT 6PM   cloNIDine (CATAPRES) 0.2 MG tablet TAKE 1 TABLET TWICE DAILY   clopidogrel (PLAVIX) 75 MG tablet TAKE 1 TABLET EVERY DAY   glipiZIDE (GLUCOTROL XL) 5 MG 24 hr tablet TAKE 1 TABLET EVERY DAY   hydrALAZINE (APRESOLINE) 100 MG tablet Take 1 tablet (100 mg total) by mouth 3 (three) times daily.   losartan (COZAAR) 100 MG tablet TAKE 1 TABLET EVERY DAY   metFORMIN (GLUCOPHAGE) 500 MG tablet Take 2 tablets (1,000 mg total) by mouth 2 (two) times daily with a meal.   mirtazapine (REMERON) 15 MG tablet TAKE 1 TABLET AT BEDTIME   pantoprazole (PROTONIX) 40 MG tablet TAKE 1 TABLET EVERY DAY   spironolactone (ALDACTONE) 25 MG tablet Take 1 tablet (25 mg total) by mouth 2 (two) times daily.   TRUE METRIX BLOOD GLUCOSE TEST test strip 1 each by Other route 4 (four) times daily as needed.   TRUEplus Lancets 33G MISC Test BS up to four times daily as needed Dx E11.9   vitamin B-12 (CYANOCOBALAMIN) 1000 MCG tablet Take 1,000 mcg by mouth daily.   No facility-administered encounter medications on file as of 11/13/2020.    Allergies (verified) Hctz [hydrochlorothiazide] and Lisinopril   History:  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HCC)    Diabetes (HCC)    Diabetic nephropathy (HCC) 02/04/2020   GERD (gastroesophageal reflux disease)    Hemorrhoids    Hypertension    IBS (irritable bowel syndrome)    Pancreatitis    Transient cerebral ischemia 05/08/2008   Qualifier: Diagnosis of  By: Diana Eves     Past Surgical History:  Procedure Laterality Date   COLONOSCOPY   09/07/2007   RMR: anal canal/external hemorrhoids, redundant colon, left-sided diverticula, otherwise normal colonic mucosa   Family History  Problem Relation Age of Onset   Hypertension Mother    Hypertension Father    Hypertension Sister    Hypertension Brother    Hypertension Brother    Diabetes Son    Hypertension Son    Colon cancer Neg Hx    Social History   Socioeconomic History   Marital status: Widowed    Spouse name: Not on file   Number of children: 3   Years of education: Not on file   Highest education level: Not on file  Occupational History   Occupation: retired  Tobacco Use   Smoking status: Former    Years: 9.00    Types: Cigarettes    Quit date: 11/27/2006    Years since quitting: 13.9   Smokeless tobacco: Never  Vaping Use   Vaping Use: Never used  Substance and Sexual Activity   Alcohol use: No    Alcohol/week: 0.0 standard drinks   Drug use: No   Sexual activity: Not Currently  Other Topics Concern   Not on file  Social History Narrative   Three children.  All live nearby.   Social Determinants of Health   Financial Resource Strain: Low Risk    Difficulty of Paying Living Expenses: Not hard at all  Food Insecurity: No Food Insecurity   Worried About Programme researcher, broadcasting/film/video in the Last Year: Never true   Ran Out of Food in the Last Year: Never true  Transportation Needs: No Transportation Needs   Lack of Transportation (Medical): No   Lack of Transportation (Non-Medical): No  Physical Activity: Inactive   Days of Exercise per Week: 0 days   Minutes of Exercise per Session: 0 min  Stress: No Stress Concern Present   Feeling of Stress : Not at all  Social Connections: Moderately Integrated   Frequency of Communication with Friends and Family: More than three times a week   Frequency of Social Gatherings with Friends and Family: More than three times a week   Attends Religious Services: More than 4 times per year   Active Member of [employer] or  Organizations: Yes   Attends Banker Meetings: More than 4 times per year   Marital Status: Widowed    Tobacco Counseling Counseling given: Not Answered   Clinical Intake:  Pre-visit preparation completed: Yes  Pain : No/denies pain     BMI - recorded: 26.95 Nutritional Status: BMI 25 -29 Overweight Nutritional Risks: None Diabetes: Yes CBG done?: No Did pt. bring in CBG monitor from home?: No  How often do you need to have someone help you when you read instructions, pamphlets, or other written materials from your doctor or pharmacy?: 1 - Never  Diabetic? No Nutrition Risk Assessment:  Has the patient had any N/V/D within the last 2 months?  No  Does the patient have any non-healing wounds?  No  Has the patient had any unintentional weight loss or weight gain?  No   Diabetes:  Is the patient diabetic?  Yes  If diabetic, was a CBG obtained today?  No  Did the patient bring in their glucometer from home?  No  How often do you monitor your CBG's? Once daily fasting - 160 this am per patient.   Financial Strains and Diabetes Management:  Are you having any financial strains with the device, your supplies or your medication? No .  Does the patient want to be seen by Chronic Care Management for management of their diabetes?  No  Would the patient like to be referred to a Nutritionist or for Diabetic Management?  No   Diabetic Exams:  Diabetic Eye Exam: Completed 07/21/2019.   Diabetic Foot Exam: Completed 04/25/2020. Pt has been advised about the importance in completing this exam. Pt is scheduled for diabetic foot exam on next visit.    Interpreter Needed?: No  Information entered by :: Yuepheng Schaller, LPN   Activities of Daily Living In your present state of health, do you have any difficulty performing the following activities: 11/13/2020  Hearing? N  Vision? N  Difficulty concentrating or making decisions? N  Walking or climbing stairs? N   Dressing or bathing? N  Doing errands, shopping? N  Preparing Food and eating ? N  Using the Toilet? N  In the past six months, have you accidently leaked urine? N  Do you have problems with loss of bowel control? N  Managing your Medications? N  Managing your Finances? N  Housekeeping or managing your Housekeeping? N  Some recent data might be hidden    Patient Care Team: Gwenlyn Fudge, FNP as PCP - General (Family Medicine) Rollene Rotunda, MD as PCP - Cardiology (Cardiology) Jena Gauss Gerrit Friends, MD as Consulting Physician (Gastroenterology) Bond, Doran Stabler, MD as Consulting Physician (Ophthalmology)  Indicate any recent Medical Services you may have received from other than [facility] providers in the past year (date may be approximate).     Assessment:   This is a routine wellness examination for John Sevier.  Hearing/Vision screen Hearing Screening - Comments:: Denies hearing difficulties  Vision Screening - Comments:: Wears rx glasses prn close up - up to date with annual eye exams with [facility]  Dietary issues and exercise activities discussed: Current Exercise Habits: The patient does not participate in regular exercise at present, Exercise limited by: cardiac condition(s);neurologic condition(s);respiratory conditions(s)   Goals Addressed             This Visit's Progress    Activity and Exercise Increased       Evidence-based guidance:  Review current exercise levels.  Assess patient perspective on exercise or activity level, barriers to increasing activity, motivation and readiness for change.  Recommend or set healthy exercise goal based on individual tolerance.  Encourage small steps toward making change in amount of exercise or activity.  Urge reduction of sedentary activities or screen time.  Promote group activities within the community or with family or support person.  Consider referral to rehabiliation therapist for assessment and exercise/activity  plan.   Notes:        Depression Screen PHQ 2/9 Scores 11/13/2020 10/23/2020 07/23/2020 07/23/2020 04/25/2020 01/17/2020 07/10/2019  PHQ - 2 Score 1 0 0 0 0 1 2  PHQ- 9 Score 1 0 3 - 3 4 9   Exception Documentation - - - - - - -    Fall Risk Fall  Risk  11/13/2020 07/23/2020 07/23/2020 04/25/2020 07/10/2019  Falls in the past year? 0 0 0 0 0  Number falls in past yr: 0 - - - -  Injury with Fall? 0 - - - -  Risk for fall due to : Mental status change - - - -  Follow up Education provided;Falls prevention discussed - - - -    FALL RISK PREVENTION PERTAINING TO THE HOME:  Any stairs in or around the home? Yes  If so, are there any without handrails? No  Home free of loose throw rugs in walkways, pet beds, electrical cords, etc? Yes  Adequate lighting in your home to reduce risk of falls? Yes   ASSISTIVE DEVICES UTILIZED TO PREVENT FALLS:  Life alert? No  Use of a cane, walker or w/c? No  Grab bars in the bathroom? No  Shower chair or bench in shower? No  Elevated toilet seat or a handicapped toilet? No   TIMED UP AND GO:  Was the test performed? No . Telephonic visit  Cognitive Function:     6CIT Screen 11/13/2020  What Year? 0 points  What month? 0 points  What time? 0 points  Count back from 20 0 points  Months in reverse 4 points  Repeat phrase 6 points  Total Score 10    Immunizations  There is no immunization history on file for this patient.  TDAP status: Due, Education has been provided regarding the importance of this vaccine. Advised may receive this vaccine at local pharmacy or Health Dept. Aware to provide a copy of the vaccination record if obtained from local pharmacy or Health Dept. Verbalized acceptance and understanding.  Flu Vaccine status: Declined, Education has been provided regarding the importance of this vaccine but patient still declined. Advised may receive this vaccine at local pharmacy or Health Dept. Aware to provide a copy of the vaccination  record if obtained from local pharmacy or Health Dept. Verbalized acceptance and understanding.  Pneumococcal vaccine status: Declined,  Education has been provided regarding the importance of this vaccine but patient still declined. Advised may receive this vaccine at local pharmacy or Health Dept. Aware to provide a copy of the vaccination record if obtained from local pharmacy or Health Dept. Verbalized acceptance and understanding.   Covid-19 vaccine status: Declined, Education has been provided regarding the importance of this vaccine but patient still declined. Advised may receive this vaccine at local pharmacy or Health Dept.or vaccine clinic. Aware to provide a copy of the vaccination record if obtained from local pharmacy or Health Dept. Verbalized acceptance and understanding.  Qualifies for Shingles Vaccine? Yes   Zostavax completed No   Shingrix Completed?: No.    Education has been provided regarding the importance of this vaccine. Patient has been advised to call insurance company to determine out of pocket expense if they have not yet received this vaccine. Advised may also receive vaccine at local pharmacy or Health Dept. Verbalized acceptance and understanding.  Screening Tests Health Maintenance  Topic Date Due   OPHTHALMOLOGY EXAM  07/20/2020   TETANUS/TDAP  01/16/2021 (Originally 09/10/1959)   Zoster Vaccines- Shingrix (1 of 2) 01/27/2021 (Originally 09/10/1959)   INFLUENZA VACCINE  04/03/2021 (Originally 08/04/2020)   Pneumonia Vaccine 30+ Years old (1 - PCV) 04/23/2021 (Originally 09/10/1946)   DEXA SCAN  04/25/2021 (Originally 09/09/2005)   HEMOGLOBIN A1C  04/23/2021   FOOT EXAM  04/25/2021   HPV VACCINES  Aged Out   COVID-19 Vaccine  Discontinued  Health Maintenance  Health Maintenance Due  Topic Date Due   OPHTHALMOLOGY EXAM  07/20/2020    Colorectal cancer screening: No longer required.   Mammogram status: No longer required due to age.  Bone Density scan:  Declined  Lung Cancer Screening: (Low Dose CT Chest recommended if Age 1-80 years, 30 pack-year currently smoking OR have quit w/in 15years.) does not qualify.  Additional Screening:  Hepatitis C Screening: does not qualify  Vision Screening: Recommended annual ophthalmology exams for early detection of glaucoma and other disorders of the eye. Is the patient up to date with their annual eye exam?  No  Who is the provider or what is the name of the office in which the patient attends annual eye exams? Daphine Deutscher in [place] @ [facility]  If pt is not established with a provider, would they like to be referred to a provider to establish care? No .   Dental Screening: Recommended annual dental exams for proper oral hygiene  Community Resource Referral / Chronic Care Management: CRR required this visit?  No   CCM required this visit?  No      Plan:     I have personally reviewed and noted the following in the patient's chart:   Medical and social history Use of alcohol, tobacco or illicit drugs  Current medications and supplements including opioid prescriptions.  Functional ability and status Nutritional status Physical activity Advanced directives List of other physicians Hospitalizations, surgeries, and ER visits in previous 12 months Vitals Screenings to include cognitive, depression, and falls Referrals and appointments  In addition, I have reviewed and discussed with patient certain preventive protocols, quality metrics, and best practice recommendations. A written personalized care plan for preventive services as well as general preventive health recommendations were provided to patient.     [place] Constable, LPN   39/03/90   Nurse Notes: 6CIT = 10

## 2020-12-03 ENCOUNTER — Ambulatory Visit (INDEPENDENT_AMBULATORY_CARE_PROVIDER_SITE_OTHER): Payer: Medicare HMO | Admitting: Family Medicine

## 2020-12-03 ENCOUNTER — Encounter: Payer: Self-pay | Admitting: Family Medicine

## 2020-12-03 DIAGNOSIS — I10 Essential (primary) hypertension: Secondary | ICD-10-CM | POA: Diagnosis not present

## 2020-12-03 MED ORDER — SPIRONOLACTONE 50 MG PO TABS: 50.0000 mg | ORAL_TABLET | Freq: Two times a day (BID) | ORAL | 2 refills | Status: DC

## 2020-12-03 NOTE — Progress Notes (Signed)
Assessment & Plan:  1. Uncontrolled hypertension - increase spironolactone from 25 mg BID to 50 mg BID - continue other antihypertensives as prescribed - BMP8+EGFR - spironolactone (ALDACTONE) 50 MG tablet; Take 1 tablet (50 mg total) by mouth 2 (two) times daily.  Dispense: 60 tablet; Refill: 2 - ambulatory referral to HTN clinic   Return in about 3 months (around 03/03/2021) for follow-up of chronic medication conditions.  Lucile Crater, NP Student  I personally was present during the history, physical exam, and medical decision-making activities of this service and have verified that the service and findings are accurately documented in the nurse practitioner student's note.  Hendricks Limes, MSN, APRN, FNP-C [facility]   Subjective:    Patient ID: Heather Oconnor, female    DOB: August 16, 1940, 80 y.o.   MRN: [id number]  Patient Care Team: Loman Brooklyn, FNP as PCP - General (Family Medicine) Minus Breeding, MD as PCP - Cardiology (Cardiology) Daneil Dolin, MD as Consulting Physician (Gastroenterology) Bond, Tracie Harrier, MD as Consulting Physician (Ophthalmology)   Chief Complaint:  Chief Complaint  Patient presents with   Hypertension    6 week follow up     HPI: Heather Oconnor is a 80 y.o. female presenting on 12/03/2020 for Hypertension (6 week follow up )  Hypertension: She is currently taking amlodipine 10 mg daily, clonidine 0.2 mg BID, hydralazine 100 mg TID, losartan 100 mg daily, and spironolactone 25 mg BID. She reports her home BP readings are usually 160/80, but she does not take it consistently. She is very high today, 192/84 on left arm and 180/82 on right taken manually. She states she has taken her medications today. She states she believes her BP may be higher because she has been talking about her deceased husband.   She was previously referred to Cardiology (Dr. Percival Spanish) last year but did not follow up after 03/2019.    New complaints: None   Social history:  Relevant past medical, surgical, family and social history reviewed and updated as indicated. Interim medical history since our last visit reviewed.  Allergies and medications reviewed and updated.  DATA REVIEWED: CHART IN EPIC  ROS: Negative unless specifically indicated above in HPI.    Current Outpatient Medications:    acetaminophen (TYLENOL) 500 MG tablet, Take 2 tablets (1,000 mg total) by mouth every 6 (six) hours as needed., Disp: 12 tablet, Rfl: 0   albuterol (VENTOLIN HFA) 108 (90 Base) MCG/ACT inhaler, Inhale 2 puffs into the lungs every 6 (six) hours as needed., Disp: 18 g, Rfl: 2   amLODipine (NORVASC) 10 MG tablet, TAKE 1 TABLET EVERY DAY, Disp: 90 tablet, Rfl: 1   atorvastatin (LIPITOR) 10 MG tablet, TAKE 1 TABLET EVERY DAY AT 6PM, Disp: 90 tablet, Rfl: 1   cloNIDine (CATAPRES) 0.2 MG tablet, TAKE 1 TABLET TWICE DAILY, Disp: 180 tablet, Rfl: 0   clopidogrel (PLAVIX) 75 MG tablet, TAKE 1 TABLET EVERY DAY, Disp: 90 tablet, Rfl: 1   glipiZIDE (GLUCOTROL XL) 5 MG 24 hr tablet, TAKE 1 TABLET EVERY DAY, Disp: 90 tablet, Rfl: 1   hydrALAZINE (APRESOLINE) 100 MG tablet, Take 1 tablet (100 mg total) by mouth 3 (three) times daily., Disp: 270 tablet, Rfl: 1   losartan (COZAAR) 100 MG tablet, TAKE 1 TABLET EVERY DAY, Disp: 90 tablet, Rfl: 1   metFORMIN (GLUCOPHAGE) 500 MG tablet, Take 2 tablets (1,000 mg total) by mouth 2 (two) times daily with a meal., Disp: 360 tablet, Rfl:  1   mirtazapine (REMERON) 15 MG tablet, TAKE 1 TABLET AT BEDTIME, Disp: 90 tablet, Rfl: 1   pantoprazole (PROTONIX) 40 MG tablet, TAKE 1 TABLET EVERY DAY, Disp: 90 tablet, Rfl: 1   TRUE METRIX BLOOD GLUCOSE TEST test strip, 1 each by Other route 4 (four) times daily as needed., Disp: , Rfl:    TRUEplus Lancets 33G MISC, Test BS up to four times daily as needed Dx E11.9, Disp: 400 each, Rfl: 3   vitamin B-12 (CYANOCOBALAMIN) 1000 MCG tablet, Take 1,000 mcg by mouth  daily., Disp: , Rfl:    spironolactone (ALDACTONE) 50 MG tablet, Take 1 tablet (50 mg total) by mouth 2 (two) times daily., Disp: 60 tablet, Rfl: 2   Allergies  Allergen Reactions   Hctz [Hydrochlorothiazide]     Per GI may have caused pancreatitis. Dr. Ardis Hughs recommended never resuming.   Lisinopril     H/O pancreatitis.   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[place])    Diabetes ([place])    Diabetic nephropathy ([place]) 02/04/2020   GERD (gastroesophageal reflux disease)    Hemorrhoids    Hypertension    IBS (irritable bowel syndrome)    Pancreatitis    Transient cerebral ischemia 05/08/2008   Qualifier: Diagnosis of  By: Zeb Comfort      Past Surgical History:  Procedure Laterality Date   COLONOSCOPY  09/07/2007   RMR: anal canal/external hemorrhoids, redundant colon, left-sided diverticula, otherwise normal colonic mucosa    Social History   Socioeconomic History   Marital status: Widowed    Spouse name: Not on file   Number of children: 3   Years of education: Not on file   Highest education level: Not on file  Occupational History   Occupation: retired  Tobacco Use   Smoking status: Former    Years: 9.00    Types: Cigarettes    Quit date: 11/27/2006    Years since quitting: 14.0   Smokeless tobacco: Never  Vaping Use   Vaping Use: Never used  Substance and Sexual Activity   Alcohol use: No    Alcohol/week: 0.0 standard drinks   Drug use: No   Sexual activity: Not Currently  Other Topics Concern   Not on file  Social History Narrative   Three children.  All live nearby.   Social Determinants of Health   Financial Resource Strain: Low Risk    Difficulty of Paying Living Expenses: Not hard at all  Food Insecurity: No Food Insecurity   Worried About Charity fundraiser in the Last Year: Never true   [place] in the Last Year: Never true  Transportation Needs: No Transportation Needs   Lack of  Transportation (Medical): No   Lack of Transportation (Non-Medical): No  Physical Activity: Inactive   Days of Exercise per Week: 0 days   Minutes of Exercise per Session: 0 min  Stress: No Stress Concern Present   Feeling of Stress : Not at all  Social Connections: Moderately Integrated   Frequency of Communication with Friends and Family: More than three times a week   Frequency of Social Gatherings with Friends and Family: More than three times a week   Attends Religious Services: More than 4 times per year   Active Member of [employer] or Organizations: Yes   Attends Archivist Meetings: More than 4 times per year   Marital Status: Widowed  Intimate  Partner Violence: Not At Risk   Fear of Current or Ex-Partner: No   Emotionally Abused: No   Physically Abused: No   Sexually Abused: No        Objective:    BP (!) 180/82 (BP Location: Right Arm)   Pulse 86   Temp 97.8 F (36.6 C) (Temporal)   Ht '5\' 4"'  (1.626 m)   Wt 71.8 kg   SpO2 92%   BMI 27.19 kg/m   Wt Readings from Last 3 Encounters:  12/03/20 71.8 kg  11/13/20 71.2 kg  10/23/20 71 kg    Physical Exam Vitals reviewed.  Constitutional:      General: She is not in acute distress.    Appearance: Normal appearance. She is overweight. She is not ill-appearing, toxic-appearing or diaphoretic.  HENT:     Head: Normocephalic and atraumatic.  Eyes:     General: No scleral icterus.       Right eye: No discharge.        Left eye: No discharge.     Conjunctiva/sclera: Conjunctivae normal.  Cardiovascular:     Rate and Rhythm: Normal rate and regular rhythm.     Heart sounds: Normal heart sounds. No murmur heard.   No friction rub. No gallop.  Pulmonary:     Effort: Pulmonary effort is normal. No respiratory distress.     Breath sounds: Normal breath sounds. No stridor. No wheezing, rhonchi or rales.  Musculoskeletal:        General: Normal range of motion.     Cervical back: Normal range of motion.   Skin:    General: Skin is warm and dry.     Capillary Refill: Capillary refill takes less than 2 seconds.  Neurological:     General: No focal deficit present.     Mental Status: She is alert and oriented to person, place, and time. Mental status is at baseline.  Psychiatric:        Mood and Affect: Mood normal.        Behavior: Behavior normal.        Thought Content: Thought content normal.        Judgment: Judgment normal.    Lab Results  Component Value Date   TSH 1.820 11/02/2018   Lab Results  Component Value Date   WBC 8.3 10/23/2020   HGB 13.7 10/23/2020   HCT 39.2 10/23/2020   MCV 84 10/23/2020   PLT 202 10/23/2020   Lab Results  Component Value Date   NA 142 10/23/2020   K 4.2 10/23/2020   CO2 25 10/23/2020   GLUCOSE 137 (H) 10/23/2020   BUN 15 10/23/2020   CREATININE 0.88 10/23/2020   BILITOT 0.6 10/23/2020   ALKPHOS 45 10/23/2020   AST 17 10/23/2020   ALT 12 10/23/2020   PROT 6.9 10/23/2020   ALBUMIN 4.6 10/23/2020   CALCIUM 9.5 10/23/2020   ANIONGAP 9 07/14/2018   EGFR 66 10/23/2020   Lab Results  Component Value Date   CHOL 156 10/23/2020   Lab Results  Component Value Date   HDL 32 (L) 10/23/2020   Lab Results  Component Value Date   LDLCALC 87 10/23/2020   Lab Results  Component Value Date   TRIG 220 (H) 10/23/2020   Lab Results  Component Value Date   CHOLHDL 4.9 (H) 10/23/2020   Lab Results  Component Value Date   HGBA1C 6.0 (H) 10/23/2020

## 2020-12-04 ENCOUNTER — Encounter: Payer: Self-pay | Admitting: Family Medicine

## 2020-12-04 LAB — BMP8+EGFR
BUN/Creatinine Ratio: 13 (ref 12–28)
BUN: 10 mg/dL (ref 8–27)
CO2: 25 mmol/L (ref 20–29)
Calcium: 10.5 mg/dL — ABNORMAL HIGH (ref 8.7–10.3)
Chloride: 100 mmol/L (ref 96–106)
Creatinine, Ser: 0.77 mg/dL (ref 0.57–1.00)
Glucose: 142 mg/dL — ABNORMAL HIGH (ref 70–99)
Potassium: 4.9 mmol/L (ref 3.5–5.2)
Sodium: 141 mmol/L (ref 134–144)
eGFR: 78 mL/min/{1.73_m2} (ref >59)

## 2020-12-11 DIAGNOSIS — H524 Presbyopia: Secondary | ICD-10-CM | POA: Diagnosis not present

## 2020-12-11 DIAGNOSIS — H348311 Tributary (branch) retinal vein occlusion, right eye, with retinal neovascularization: Secondary | ICD-10-CM | POA: Diagnosis not present

## 2020-12-11 DIAGNOSIS — H401121 Primary open-angle glaucoma, left eye, mild stage: Secondary | ICD-10-CM | POA: Diagnosis not present

## 2020-12-11 DIAGNOSIS — Z961 Presence of intraocular lens: Secondary | ICD-10-CM | POA: Diagnosis not present

## 2020-12-11 DIAGNOSIS — H401113 Primary open-angle glaucoma, right eye, severe stage: Secondary | ICD-10-CM | POA: Diagnosis not present

## 2020-12-11 DIAGNOSIS — H2511 Age-related nuclear cataract, right eye: Secondary | ICD-10-CM | POA: Diagnosis not present

## 2020-12-11 DIAGNOSIS — E119 Type 2 diabetes mellitus without complications: Secondary | ICD-10-CM | POA: Diagnosis not present

## 2020-12-11 DIAGNOSIS — Z7984 Long term (current) use of oral hypoglycemic drugs: Secondary | ICD-10-CM | POA: Diagnosis not present

## 2020-12-17 ENCOUNTER — Other Ambulatory Visit: Payer: Self-pay | Admitting: Family Medicine

## 2020-12-17 DIAGNOSIS — I10 Essential (primary) hypertension: Secondary | ICD-10-CM

## 2021-02-11 ENCOUNTER — Encounter (HOSPITAL_BASED_OUTPATIENT_CLINIC_OR_DEPARTMENT_OTHER): Payer: Self-pay | Admitting: Cardiovascular Disease

## 2021-02-11 ENCOUNTER — Other Ambulatory Visit: Payer: Self-pay

## 2021-02-11 ENCOUNTER — Ambulatory Visit (HOSPITAL_BASED_OUTPATIENT_CLINIC_OR_DEPARTMENT_OTHER): Payer: Medicare HMO | Admitting: Cardiovascular Disease

## 2021-02-11 VITALS — BP 186/72 | HR 78 | Ht 64.0 in | Wt 157.7 lb

## 2021-02-11 DIAGNOSIS — I10 Essential (primary) hypertension: Secondary | ICD-10-CM

## 2021-02-11 DIAGNOSIS — I700 Atherosclerosis of aorta: Secondary | ICD-10-CM

## 2021-02-11 MED ORDER — VALSARTAN 320 MG PO TABS: 320.0000 mg | ORAL_TABLET | Freq: Every day | ORAL | 3 refills | Status: DC

## 2021-02-11 NOTE — Progress Notes (Signed)
Advanced [facility]pertension Clinic Initial Assessment:    Date:  02/12/2021   ID:  Heather Oconnor Dec 17, 1940, MRN [id number]  PCP:  Gwenlyn Fudge, FNP  Cardiologist:  Rollene Rotunda, MD  Nephrologist:  Referring MD: Gwenlyn Fudge, FNP   CC: Hypertension  History of Present Illness:    Heather Oconnor is a 81 y.o. female with a hx of hypertension, chronic diastolic heart failure, COPD, diabetes, stroke, coronary and aortic atherosclerosis, here to establish care in the Advanced Hypertension Clinic. She saw her PCP 11/2020 and her BP was 180/82 despite taking amlodipine, hydralazine, losartan, clonidine, and spironolactone. Spironolactone was increased and she was referred to [facility]. She previously saw Dr. Antoine Poche in 2021. At that time her blood pressure was running in the 140s-150s. He increased her hydralazine and recommended follow-up, but she hasn't been seen since that time.  She is accompanied by her daughter. Today, she reports struggling with her blood pressure for years. Prior to developing pancreatitis 3 years ago, she was on less medication for her hypertension. She remains compliant at this time. At home she monitors her blood pressure daily. Per her BP log, her readings average 130s-170s/70s-80s in the morning, with similar readings in the evenings. Also, she continues to adjust with the stress of living alone since her husband passed away 2 years ago. Of note, she had a stroke when she was 81 yo which she describes as a light stroke. For activity she walks around the house, and completes chores such as cleaning. Generally she feels fatigued and sometimes short of breath. For a while she tried exercising previously but noticed no improvement in her blood pressure. Typically she does not add salt to her meals. Her diet often includes canned vegetables and sandwiches. She will drink 1.5 cups of coffee gradually through the day. No alcohol consumption.  Current supplements include Vitamin B12. Very rarely she takes tylenol for pain management. Lately she is sleeping well with taking her sleep aid. Her husband had told her that she snores. She denies any palpitations, or chest pain. No lightheadedness, headaches, syncope, orthopnea, PND, or lower extremity edema.  Previous antihypertensives:   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   Qualifier: Diagnosis of  By: Diana Eves     Pancreatitis    Transient cerebral ischemia 05/08/2008   Qualifier: Diagnosis of  By: Diana Eves      Past Surgical History:  Procedure Laterality Date   COLONOSCOPY  09/07/2007   RMR: anal canal/external hemorrhoids, redundant colon, left-sided diverticula, otherwise normal colonic mucosa    Current Medications: Current Meds  Medication Sig   acetaminophen (TYLENOL) 500 MG tablet Take 2 tablets (1,000 mg total) by mouth every 6 (six) hours as needed.   amLODipine (NORVASC) 10 MG tablet TAKE 1 TABLET EVERY DAY   atorvastatin (LIPITOR) 10 MG tablet TAKE 1 TABLET EVERY DAY AT 6PM   cloNIDine (CATAPRES) 0.2 MG tablet TAKE 1 TABLET TWICE DAILY   clopidogrel (PLAVIX) 75 MG tablet TAKE 1 TABLET EVERY DAY   dorzolamide-timolol (COSOPT) 22.3-6.8 MG/ML ophthalmic solution Place 1 drop into both eyes 2 (two) times daily.   glipiZIDE (GLUCOTROL XL) 5 MG  24 hr tablet TAKE 1 TABLET EVERY DAY   hydrALAZINE (APRESOLINE) 100 MG tablet TAKE 1 TABLET THREE TIMES DAILY   metFORMIN (GLUCOPHAGE) 500 MG tablet Take 2 tablets (1,000 mg total) by mouth 2 (two) times daily with a meal.   mirtazapine (REMERON) 15 MG tablet TAKE 1 TABLET AT BEDTIME   pantoprazole (PROTONIX) 40 MG tablet TAKE 1 TABLET EVERY DAY    spironolactone (ALDACTONE) 50 MG tablet Take 1 tablet (50 mg total) by mouth 2 (two) times daily.   TRUE METRIX BLOOD GLUCOSE TEST test strip 1 each by Other route 4 (four) times daily as needed.   TRUEplus Lancets 33G MISC Test BS up to four times daily as needed Dx E11.9   vitamin B-12 (CYANOCOBALAMIN) 1000 MCG tablet Take 1,000 mcg by mouth daily.   [DISCONTINUED] albuterol (VENTOLIN HFA) 108 (90 Base) MCG/ACT inhaler Inhale 2 puffs into the lungs every 6 (six) hours as needed.   [DISCONTINUED] losartan (COZAAR) 100 MG tablet TAKE 1 TABLET EVERY DAY   [DISCONTINUED] valsartan (DIOVAN) 320 MG tablet Take 1 tablet (320 mg total) by mouth daily.     Allergies:   Hctz [hydrochlorothiazide] and Lisinopril   Social History   Socioeconomic History   Marital status: Widowed    Spouse name: Not on file   Number of children: 3   Years of education: Not on file   Highest education level: Not on file  Occupational History   Occupation: retired  Tobacco Use   Smoking status: Former    Years: 9.00    Types: Cigarettes    Quit date: 11/27/2006    Years since quitting: 14.2   Smokeless tobacco: Never  Vaping Use   Vaping Use: Never used  Substance and Sexual Activity   Alcohol use: No    Alcohol/week: 0.0 standard drinks   Drug use: No   Sexual activity: Not Currently  Other Topics Concern   Not on file  Social History Narrative   Three children.  All live nearby.   Social Determinants of Health   Financial Resource Strain: Low Risk    Difficulty of Paying Living Expenses: Not hard at all  Food Insecurity: No Food Insecurity   Worried About Programme researcher, broadcasting/film/video in the Last Year: Never true   Ran Out of Food in the Last Year: Never true  Transportation Needs: No Transportation Needs   Lack of Transportation (Medical): No   Lack of Transportation (Non-Medical): No  Physical Activity: Inactive   Days of Exercise per Week: 0 days   Minutes of Exercise per Session: 0 min  Stress: No  Stress Concern Present   Feeling of Stress : Not at all  Social Connections: Moderately Integrated   Frequency of Communication with Friends and Family: More than three times a week   Frequency of Social Gatherings with Friends and Family: More than three times a week   Attends Religious Services: More than 4 times per year   Active Member of [employer] or Organizations: Yes   Attends Banker Meetings: More than 4 times per year   Marital Status: Widowed     Family History: The patient's family history includes Diabetes in her son; Heart attack in her father; Hypertension in her brother, brother, father, mother, sister, and son. There is no history of Colon cancer.  ROS:   Please see the history of present illness.    (+) Fatigue (+) Shortness of breath (+) Stress (+) Snoring All other systems reviewed  and are negative.  EKGs/Labs/Other Studies Reviewed:    CTA Chest/Aorta 06/16/2018: COMPARISON:  PA and lateral chest 06/12/2018. Single-view of the chest 06/14/2018 and 06/13/2018.  IMPRESSION: Negative for pulmonary embolus.   Small bilateral pleural effusions, greater on the left with associated mild compressive atelectasis.   Cardiomegaly.   Fatty infiltration of the liver.   Partial visualization of changes in the upper abdomen consistent with the patient's known pancreatitis.   Aortic Atherosclerosis (ICD10-I70.0) and Emphysema (ICD10-J43.9). Calcific coronary artery disease also noted.  Echo 06/13/2018:  1. The left ventricle has normal systolic function, with an ejection  fraction of 55-60%. The cavity size was normal. There is mildly increased  left ventricular wall thickness. Left ventricular diastolic Doppler  parameters are consistent with impaired relaxation.   2. The right ventricle has normal systolic function. The cavity was  normal. There is no increase in right ventricular wall thickness. Right  ventricular systolic pressure could not be  accurately assessed.   3. Left atrial size was mild-moderately dilated.   4. The aortic valve is tricuspid. Mild calcification of the aortic valve.  Mild aortic annular calcification noted. Valve appears moderately scleroic  but not stenotic.   5. The mitral valve is grossly normal. There is mild mitral annular  calcification present.   6. The tricuspid valve is grossly normal.   7. The aortic root is normal in size and structure.   EKG:   02/11/2021: Sinus rhythm. Rate 78 bpm.  Recent Labs: 10/23/2020: ALT 12; Hemoglobin 13.7; Platelets 202 12/03/2020: BUN 10; Creatinine, Ser 0.77; Potassium 4.9; Sodium 141   Recent Lipid Panel    Component Value Date/Time   CHOL 156 10/23/2020 0917   TRIG 220 (H) 10/23/2020 0917   HDL 32 (L) 10/23/2020 0917   CHOLHDL 4.9 (H) 10/23/2020 0917   CHOLHDL 4.6 06/12/2018 0815   VLDL 27 06/12/2018 0815   LDLCALC 87 10/23/2020 0917    Physical Exam:    VS:  BP (!) 186/72 (BP Location: Right Arm, Patient Position: Sitting, Cuff Size: Normal)    Pulse 78    Ht 5\' 4"  (1.626 m)    Wt 157 lb 11.2 oz (71.5 kg)    BMI 27.07 kg/m  , BMI Body mass index is 27.07 kg/m. GENERAL:  Well appearing HEENT: Pupils equal round and reactive, fundi not visualized, oral mucosa unremarkable NECK:  No jugular venous distention, waveform within normal limits, carotid upstroke brisk and symmetric, no bruits, no thyromegaly LUNGS:  Clear to auscultation bilaterally HEART:  RRR.  PMI not displaced or sustained,S1 and S2 within normal limits, no S3, no S4, no clicks, no rubs, no murmurs ABD:  Flat, positive bowel sounds normal in frequency in pitch, no bruits, no rebound, no guarding, no midline pulsatile mass, no hepatomegaly, no splenomegaly EXT:  2 plus pulses throughout, no edema, no cyanosis no clubbing SKIN:  No rashes no nodules NEURO:  Cranial nerves II through XII grossly intact, motor grossly intact throughout PSYCH:  Cognitively intact, oriented to person place and  time   ASSESSMENT/PLAN:    Malignant hypertension Ms. Westerhoff's blood pressure is very elevated despite being on multiple medications.  She has been taking her medications as prescribed.  The likelihood of hyperaldosteronism is slim given that she is on such large doses of spironolactone without any improvement in her pressure.  I do not feel comfortable holding her spironolactone and losartan long enough to assess this at this time.  We will switch losartan to valsartan  320 mg daily.  She is not on a thiazide diuretic due to history of pancreatitis.  I did note that she is not on a beta-blocker and recommend starting 1 next.  She was hesitant to make more than 1 change at this time.  Would start bisoprolol at follow-up if her blood pressure remains high.  She does have underlying COPD so would avoid carvedilol.  We could also consider adding doxazosin.  We will check renal artery Dopplers to rule out renal artery stenosis.  Unfortunately, she is not a candidate for renal artery denervation due to age.  Continue work on increasing exercise and limiting sodium in her diet.  Atherosclerosis of aorta ([facility]CC) She has calcification of the aorta and coronary arteries.  She has no ischemic symptoms.  Continue atorvastatin.  Working on diet and exercise as above.   Screening for Secondary Hypertension:  Causes 02/11/2021  Drugs/Herbals Screened  Renovascular HTN Screened     - Comments Check renal artery Dopplers  Sleep Apnea Screened     - Comments No symptoms  Thyroid Disease Screened  Hyperaldosteronism Not Screened     - Comments She is already on spironolactone 50 mg with no improvement in her blood pressure  Pheochromocytoma N/A     - Comments No symptoms  Cushing's Syndrome N/A     - Comments No symptoms  Hyperparathyroidism Screened  Coarctation of the Aorta Screened     - Comments Blood pressure symmetric  Compliance Screened    Relevant Labs/Studies: Basic Labs Latest Ref Rng & Units  12/03/2020 10/23/2020 07/23/2020  Sodium 134 - 144 mmol/L 141 142 140  Potassium 3.5 - 5.2 mmol/L 4.9 4.2 4.8  Creatinine 0.57 - 1.00 mg/dL 1.610.77 0.960.88 0.450.71    Thyroid  Latest Ref Rng & Units 11/02/2018 08/02/2018  TSH 0.450 - 4.500 uIU/mL 1.820 1.210             Renovascular  02/11/2021  Renal Artery US Completed Yes    Disposition:    FU with Balinda Heacock C. Duke Salviaandolph, MD, [facility] in [facility] in 1 month.   Medication Adjustments/Labs and Tests Ordered: Current medicines are reviewed at length with the patient today.  Concerns regarding medicines are outlined above.   Orders Placed This Encounter  Procedures   TSH   Cortisol   Basic metabolic panel   EKG 12-Lead   VAS US RENAL ARTERY DUPLEX   Meds ordered this encounter  Medications   DISCONTD: valsartan (DIOVAN) 320 MG tablet    Sig: Take 1 tablet (320 mg total) by mouth daily.    Dispense:  90 tablet    Refill:  3    D/C LOSARTAN   valsartan (DIOVAN) 320 MG tablet    Sig: Take 1 tablet (320 mg total) by mouth daily.    Dispense:  90 tablet    Refill:  3    D/C LOSARTAN   I,Mathew Stumpf,acting as a scribe for Chilton Siiffany [place], MD.,have documented all relevant documentation on the behalf of Chilton Siiffany [place], MD,as directed by  Chilton Siiffany [place], MD while in the presence of Chilton Siiffany [place], MD.  I, Raelyn Racette C. Duke Salviaandolph, MD have reviewed all documentation for this visit.  The documentation of the exam, diagnosis, procedures, and orders on 02/12/2021 are all accurate and complete.   Signed, Chilton Siiffany [place], MD  02/12/2021 12:46 PM    [facility]

## 2021-02-11 NOTE — Patient Instructions (Signed)
Medication Instructions:  STOP LOSARTAN   START VALSARTAN 320 MG DAILY    Labwork: CORTISOL/TSH/BMET AT YOUR PRIMARY CARE IF THEY WILL DO LABS. IF NOT YOU CAN GO TO ANY [facility]    Testing/Procedures: Your physician has requested that you have a renal artery duplex. During this test, an ultrasound is used to evaluate blood flow to the kidneys. Allow one hour for this exam. Do not eat after midnight the day before and avoid carbonated beverages. Take your medications as you usually do.    Follow-Up: 03/25/2021 AT 2:15 WITH [facility]    Special Instructions:   MONITOR YOUR BLOOD PRESSURE TWICE A DAY, LOG IN THE BOOK PROVIDED. BRING THE BOOK AND YOUR BLOOD PRESSURE MACHINE TO YOUR FOLLOW UP IN 1 MONTH   DASH Eating Plan DASH stands for "Dietary Approaches to Stop Hypertension." The DASH eating plan is a healthy eating plan that has been shown to reduce high blood pressure (hypertension). It may also reduce your risk for type 2 diabetes, heart disease, and stroke. The DASH eating plan may also help with weight loss. What are tips for following this plan?  General guidelines Avoid eating more than 2,300 mg (milligrams) of salt (sodium) a day. If you have hypertension, you may need to reduce your sodium intake to 1,500 mg a day. Limit alcohol intake to no more than 1 drink a day for nonpregnant women and 2 drinks a day for men. One drink equals 12 oz of beer, 5 oz of wine, or 1 oz of hard liquor. Work with your health care provider to maintain a healthy body weight or to lose weight. Ask what an ideal weight is for you. Get at least 30 minutes of exercise that causes your heart to beat faster (aerobic exercise) most days of the week. Activities may include walking, swimming, or biking. Work with your health care provider or diet and nutrition specialist (dietitian) to adjust your eating plan to your individual calorie needs. Reading food labels  Check food labels for the amount of sodium per  serving. Choose foods with less than 5 percent of the Daily Value of sodium. Generally, foods with less than 300 mg of sodium per serving fit into this eating plan. To find whole grains, look for the word "whole" as the first word in the ingredient list. Shopping Buy products labeled as "low-sodium" or "no salt added." Buy fresh foods. Avoid canned foods and premade or frozen meals. Cooking Avoid adding salt when cooking. Use salt-free seasonings or herbs instead of table salt or sea salt. Check with your health care provider or pharmacist before using salt substitutes. Do not fry foods. Cook foods using healthy methods such as baking, boiling, grilling, and broiling instead. Cook with heart-healthy oils, such as olive, canola, soybean, or sunflower oil. Meal planning Eat a balanced diet that includes: 5 or more servings of fruits and vegetables each day. At each meal, try to fill half of your plate with fruits and vegetables. Up to 6-8 servings of whole grains each day. Less than 6 oz of lean meat, poultry, or fish each day. A 3-oz serving of meat is about the same size as a deck of cards. One egg equals 1 oz. 2 servings of low-fat dairy each day. A serving of nuts, seeds, or beans 5 times each week. Heart-healthy fats. Healthy fats called Omega-3 fatty acids are found in foods such as flaxseeds and coldwater fish, like sardines, salmon, and mackerel. Limit how much you eat of  the following: Canned or prepackaged foods. Food that is high in trans fat, such as fried foods. Food that is high in saturated fat, such as fatty meat. Sweets, desserts, sugary drinks, and other foods with added sugar. Full-fat dairy products. Do not salt foods before eating. Try to eat at least 2 vegetarian meals each week. Eat more home-cooked food and less restaurant, buffet, and fast food. When eating at a restaurant, ask that your food be prepared with less salt or no salt, if possible. What foods are  recommended? The items listed may not be a complete list. Talk with your dietitian about what dietary choices are best for you. Grains Whole-grain or whole-wheat bread. Whole-grain or whole-wheat pasta. Brown rice. Orpah Cobb. Bulgur. Whole-grain and low-sodium cereals. Pita bread. Low-fat, low-sodium crackers. Whole-wheat flour tortillas. Vegetables Fresh or frozen vegetables (raw, steamed, roasted, or grilled). Low-sodium or reduced-sodium tomato and vegetable juice. Low-sodium or reduced-sodium tomato sauce and tomato paste. Low-sodium or reduced-sodium canned vegetables. Fruits All fresh, dried, or frozen fruit. Canned fruit in natural juice (without added sugar). Meat and other protein foods Skinless chicken or [place]. Ground chicken or [place]. Pork with fat trimmed off. Fish and seafood. Egg whites. Dried beans, peas, or lentils. Unsalted nuts, nut butters, and seeds. Unsalted canned beans. Lean cuts of beef with fat trimmed off. Low-sodium, lean deli meat. Dairy Low-fat (1%) or fat-free (skim) milk. Fat-free, low-fat, or reduced-fat cheeses. Nonfat, low-sodium ricotta or cottage cheese. Low-fat or nonfat yogurt. Low-fat, low-sodium cheese. Fats and oils Soft margarine without trans fats. Vegetable oil. Low-fat, reduced-fat, or light mayonnaise and salad dressings (reduced-sodium). Canola, safflower, olive, soybean, and sunflower oils. Avocado. Seasoning and other foods Herbs. Spices. Seasoning mixes without salt. Unsalted popcorn and pretzels. Fat-free sweets. What foods are not recommended? The items listed may not be a complete list. Talk with your dietitian about what dietary choices are best for you. Grains Baked goods made with fat, such as croissants, muffins, or some breads. Dry pasta or rice meal packs. Vegetables Creamed or fried vegetables. Vegetables in a cheese sauce. Regular canned vegetables (not low-sodium or reduced-sodium). Regular canned tomato sauce and paste (not  low-sodium or reduced-sodium). Regular tomato and vegetable juice (not low-sodium or reduced-sodium). Rosita Fire. Olives. Fruits Canned fruit in a light or heavy syrup. Fried fruit. Fruit in cream or butter sauce. Meat and other protein foods Fatty cuts of meat. Ribs. Fried meat. Tomasa Blase. Sausage. Bologna and other processed lunch meats. Salami. Fatback. Hotdogs. Bratwurst. Salted nuts and seeds. Canned beans with added salt. Canned or smoked fish. Whole eggs or egg yolks. Chicken or [place] with skin. Dairy Whole or 2% milk, cream, and half-and-half. Whole or full-fat cream cheese. Whole-fat or sweetened yogurt. Full-fat cheese. Nondairy creamers. Whipped toppings. Processed cheese and cheese spreads. Fats and oils Butter. Stick margarine. Lard. Shortening. Ghee. Bacon fat. Tropical oils, such as coconut, palm kernel, or palm oil. Seasoning and other foods Salted popcorn and pretzels. Onion salt, garlic salt, seasoned salt, table salt, and sea salt. Worcestershire sauce. Tartar sauce. Barbecue sauce. Teriyaki sauce. Soy sauce, including reduced-sodium. Steak sauce. Canned and packaged gravies. Fish sauce. Oyster sauce. Cocktail sauce. Horseradish that you find on the shelf. Ketchup. Mustard. Meat flavorings and tenderizers. Bouillon cubes. Hot sauce and Tabasco sauce. Premade or packaged marinades. Premade or packaged taco seasonings. Relishes. Regular salad dressings. Where to find more information: National Heart, Lung, and Blood Institute: PopSteam.is American Heart Association: www.heart.org Summary The DASH eating plan is a healthy eating plan  that has been shown to reduce high blood pressure (hypertension). It may also reduce your risk for type 2 diabetes, heart disease, and stroke. With the DASH eating plan, you should limit salt (sodium) intake to 2,300 mg a day. If you have hypertension, you may need to reduce your sodium intake to 1,500 mg a day. When on the DASH eating plan, aim to eat  more fresh fruits and vegetables, whole grains, lean proteins, low-fat dairy, and heart-healthy fats. Work with your health care provider or diet and nutrition specialist (dietitian) to adjust your eating plan to your individual calorie needs. This information is not intended to replace advice given to you by your health care provider. Make sure you discuss any questions you have with your health care provider. Document Released: 12/10/2010 Document Revised: 12/03/2016 Document Reviewed: 12/15/2015 Elsevier Patient Education  2020 [employer].

## 2021-02-12 ENCOUNTER — Encounter (HOSPITAL_BASED_OUTPATIENT_CLINIC_OR_DEPARTMENT_OTHER): Payer: Self-pay | Admitting: Cardiovascular Disease

## 2021-02-12 NOTE — Assessment & Plan Note (Addendum)
Heather Oconnor blood pressure is very elevated despite being on multiple medications.  She has been taking her medications as prescribed.  The likelihood of hyperaldosteronism is slim given that she is on such large doses of spironolactone without any improvement in her pressure.  I do not feel comfortable holding her spironolactone and losartan long enough to assess this at this time.  We will switch losartan to valsartan 320 mg daily.  She is not on a thiazide diuretic due to history of pancreatitis.  I did note that she is not on a beta-blocker and recommend starting 1 next.  She was hesitant to make more than 1 change at this time.  Would start bisoprolol at follow-up if her blood pressure remains high.  She does have underlying COPD so would avoid carvedilol.  We could also consider adding doxazosin.  We will check renal artery Dopplers to rule out renal artery stenosis.  Unfortunately, she is not a candidate for renal artery denervation due to age.  Continue work on increasing exercise and limiting sodium in her diet.

## 2021-02-12 NOTE — Assessment & Plan Note (Signed)
She has calcification of the aorta and coronary arteries.  She has no ischemic symptoms.  Continue atorvastatin.  Working on diet and exercise as above.

## 2021-02-15 ENCOUNTER — Other Ambulatory Visit: Payer: Self-pay | Admitting: Family Medicine

## 2021-02-15 DIAGNOSIS — I10 Essential (primary) hypertension: Secondary | ICD-10-CM

## 2021-02-24 ENCOUNTER — Ambulatory Visit (INDEPENDENT_AMBULATORY_CARE_PROVIDER_SITE_OTHER): Payer: Medicare HMO

## 2021-02-24 ENCOUNTER — Other Ambulatory Visit: Payer: Self-pay

## 2021-02-24 DIAGNOSIS — I10 Essential (primary) hypertension: Secondary | ICD-10-CM

## 2021-03-03 ENCOUNTER — Ambulatory Visit (INDEPENDENT_AMBULATORY_CARE_PROVIDER_SITE_OTHER): Payer: Medicare HMO | Admitting: Family Medicine

## 2021-03-03 ENCOUNTER — Encounter: Payer: Self-pay | Admitting: Family Medicine

## 2021-03-03 VITALS — BP 144/67 | HR 76 | Temp 97.6°F | Ht 64.0 in | Wt 155.0 lb

## 2021-03-03 DIAGNOSIS — Z8673 Personal history of transient ischemic attack (TIA), and cerebral infarction without residual deficits: Secondary | ICD-10-CM | POA: Diagnosis not present

## 2021-03-03 DIAGNOSIS — K2100 Gastro-esophageal reflux disease with esophagitis, without bleeding: Secondary | ICD-10-CM

## 2021-03-03 DIAGNOSIS — I10 Essential (primary) hypertension: Secondary | ICD-10-CM | POA: Diagnosis not present

## 2021-03-03 DIAGNOSIS — I5032 Chronic diastolic (congestive) heart failure: Secondary | ICD-10-CM

## 2021-03-03 DIAGNOSIS — E1165 Type 2 diabetes mellitus with hyperglycemia: Secondary | ICD-10-CM

## 2021-03-03 DIAGNOSIS — E782 Mixed hyperlipidemia: Secondary | ICD-10-CM | POA: Diagnosis not present

## 2021-03-03 DIAGNOSIS — E1121 Type 2 diabetes mellitus with diabetic nephropathy: Secondary | ICD-10-CM | POA: Diagnosis not present

## 2021-03-03 DIAGNOSIS — G479 Sleep disorder, unspecified: Secondary | ICD-10-CM

## 2021-03-03 DIAGNOSIS — J449 Chronic obstructive pulmonary disease, unspecified: Secondary | ICD-10-CM

## 2021-03-03 DIAGNOSIS — F321 Major depressive disorder, single episode, moderate: Secondary | ICD-10-CM

## 2021-03-03 DIAGNOSIS — I700 Atherosclerosis of aorta: Secondary | ICD-10-CM

## 2021-03-03 LAB — BAYER DCA HB A1C WAIVED: HB A1C (BAYER DCA - WAIVED): 6 % — ABNORMAL HIGH (ref 4.8–5.6)

## 2021-03-03 MED ORDER — CLONIDINE HCL 0.2 MG PO TABS: 0.2000 mg | ORAL_TABLET | Freq: Two times a day (BID) | ORAL | 1 refills | Status: DC

## 2021-03-03 MED ORDER — HYDRALAZINE HCL 100 MG PO TABS: 100.0000 mg | ORAL_TABLET | Freq: Three times a day (TID) | ORAL | 1 refills | Status: DC

## 2021-03-03 NOTE — Patient Instructions (Signed)
Please keep your schedule eye exam appointment and have them send [place] the report.

## 2021-03-03 NOTE — Progress Notes (Signed)
Assessment & Plan:  1. Type 2 diabetes mellitus with hyperglycemia, without long-term current use of insulin (HCC) Lab Results  Component Value Date   HGBA1C 6.0 (H) 03/03/2021   HGBA1C 6.0 (H) 10/23/2020   HGBA1C 6.3 07/23/2020    - Diabetes is at goal of A1c < 7. - Medications: continue current medications - Home glucose monitoring: continue monitoring - Patient is currently taking a statin. Patient is taking an ACE-inhibitor/ARB.   Diabetes Health Maintenance Due  Topic Date Due   OPHTHALMOLOGY EXAM  07/20/2020   HEMOGLOBIN A1C  04/23/2021   FOOT EXAM  04/25/2021    Lab Results  Component Value Date   LABMICR 30.7 01/29/2020   LABMICR 41.4 04/09/2019   - Lipid panel - CBC with Differential/Platelet - CMP14+EGFR - Bayer DCA Hb A1c Waived - Microalbumin / creatinine urine ratio  2. Diabetic nephropathy associated with type 2 diabetes mellitus ([place]) Continue ARB and good control of diabetes. - Microalbumin / creatinine urine ratio  3. Chronic diastolic CHF (congestive heart failure) (HCC) Well controlled on current regimen.  - Lipid panel - CBC with Differential/Platelet - CMP14+EGFR  4. Malignant hypertension Improving. Continue following with the hypertension clinic. - Lipid panel - CBC with Differential/Platelet - CMP14+EGFR - hydrALAZINE (APRESOLINE) 100 MG tablet; Take 1 tablet (100 mg total) by mouth 3 (three) times daily.  Dispense: 270 tablet; Refill: 1 - cloNIDine (CATAPRES) 0.2 MG tablet; Take 1 tablet (0.2 mg total) by mouth 2 (two) times daily.  Dispense: 180 tablet; Refill: 1  5. Mixed hyperlipidemia Well controlled on current regimen.  - Lipid panel - CMP14+EGFR  6. Atherosclerosis of aorta (HCC) Continue Atorvastatin and Plavix. - Lipid panel - CMP14+EGFR  7. History of CVA in adulthood Continue Atorvastatin and Plavix. - Lipid panel - CMP14+EGFR  8. COPD without exacerbation ([place]) Well controlled on current regimen.   9.  Gastroesophageal reflux disease with esophagitis, unspecified whether hemorrhage Well controlled on current regimen.  - CMP14+EGFR  10. Moderate major depression ([place]) Well controlled on current regimen.  - CMP14+EGFR  11. Difficulty sleeping Well controlled on current regimen.  - CMP14+EGFR   Return in about 6 months (around 08/31/2021) for follow-up of chronic medication conditions.  Hendricks Limes, MSN, APRN, FNP-C [facility]  Subjective:    Patient ID: Heather Oconnor, female    DOB: May 29, 1940, 81 y.o.   MRN: [id number]  Patient Care Team: Loman Brooklyn, FNP as PCP - General (Family Medicine) Minus Breeding, MD as PCP - Cardiology (Cardiology) Gala Romney Cristopher Estimable, MD as Consulting Physician (Gastroenterology) Bond, Tracie Harrier, MD as Consulting Physician (Ophthalmology)   Chief Complaint:  Chief Complaint  Patient presents with   Medical Management of Chronic Issues    HPI: Heather Oconnor is a 81 y.o. female presenting on 03/03/2021 for Medical Management of Chronic Issues  Diabetes: Patient presents for follow up of diabetes. Current symptoms include: hyperglycemia. Known diabetic complications: nephropathy, cardiovascular disease and cerebrovascular disease. Medication compliance: yes. Current diet: in general, a "healthy" diet  . Current exercise: none. Home blood sugar records:  140-160s . Is she  on ACE inhibitor or angiotensin II receptor blocker? Yes (Valsartan). Is she on a statin? Yes (Atorvastatin).   Hypertension: Patient was referred to the hypertension clinic at our last visit. She saw Dr. Oval Linsey on 02/11/2021 at which time her Losartan was switched to Valsartan. Her plan is to start bisoprolol at follow-up if she remains elevated, but will avoid carvedilol due  to COPD. She is checking renal artery dopplers to rule out renal artery stenosis. Patient states she did the dopplers last week. Patient is scheduled to follow-up on  03/25/2021. Patient is checking her blood pressure at home twice daily and keeping a log for cardiology. She states it is sometimes up and sometimes down, but overall she feels her numbers are improving.   CHF: denies shortness of breath and swelling.   Hyperlipidemia/Atherosclerosis of aorta/History of CVA: taking Atorvastatin and Plavix.   GERD: taking pantoprazole.  COPD: She is not taking any medication for her COPD. She states she is doing well and is not having episodes of shortness of breath and uses an incentive spirometer to exercise her lungs. She does not want any respiratory medications ordered.   Depression/Difficulty sleeping: doing well with Remeron. She tried to come off of it, but did not do well so it was restarted.  Depression screen [facility] 2/9 03/03/2021 12/03/2020 11/13/2020  Decreased Interest 0 0 0  Down, Depressed, Hopeless 0 0 1  PHQ - 2 Score 0 0 1  Altered sleeping 0 0 0  Tired, decreased energy 1 1 0  Change in appetite 0 0 0  Feeling bad or failure about yourself  0 0 0  Trouble concentrating 0 0 0  Moving slowly or fidgety/restless 0 0 0  Suicidal thoughts 0 0 0  PHQ-9 Score '1 1 1  ' Difficult doing work/chores Not difficult at all Not difficult at all Not difficult at all  Some recent data might be hidden    New complaints: None   Social history:  Relevant past medical, surgical, family and social history reviewed and updated as indicated. Interim medical history since our last visit reviewed.  Allergies and medications reviewed and updated.  DATA REVIEWED: CHART IN EPIC  ROS: Negative unless specifically indicated above in HPI.    Current Outpatient Medications:    acetaminophen (TYLENOL) 500 MG tablet, Take 2 tablets (1,000 mg total) by mouth every 6 (six) hours as needed., Disp: 12 tablet, Rfl: 0   amLODipine (NORVASC) 10 MG tablet, TAKE 1 TABLET EVERY DAY, Disp: 90 tablet, Rfl: 1   atorvastatin (LIPITOR) 10 MG tablet, TAKE 1 TABLET EVERY DAY  AT 6PM, Disp: 90 tablet, Rfl: 1   cloNIDine (CATAPRES) 0.2 MG tablet, TAKE 1 TABLET TWICE DAILY, Disp: 180 tablet, Rfl: 0   clopidogrel (PLAVIX) 75 MG tablet, TAKE 1 TABLET EVERY DAY, Disp: 90 tablet, Rfl: 1   dorzolamide-timolol (COSOPT) 22.3-6.8 MG/ML ophthalmic solution, Place 1 drop into both eyes 2 (two) times daily., Disp: , Rfl:    glipiZIDE (GLUCOTROL XL) 5 MG 24 hr tablet, TAKE 1 TABLET EVERY DAY, Disp: 90 tablet, Rfl: 1   hydrALAZINE (APRESOLINE) 100 MG tablet, TAKE 1 TABLET THREE TIMES DAILY, Disp: 270 tablet, Rfl: 0   metFORMIN (GLUCOPHAGE) 500 MG tablet, Take 2 tablets (1,000 mg total) by mouth 2 (two) times daily with a meal., Disp: 360 tablet, Rfl: 1   mirtazapine (REMERON) 15 MG tablet, TAKE 1 TABLET AT BEDTIME, Disp: 90 tablet, Rfl: 1   pantoprazole (PROTONIX) 40 MG tablet, TAKE 1 TABLET EVERY DAY, Disp: 90 tablet, Rfl: 1   spironolactone (ALDACTONE) 50 MG tablet, TAKE 1 TABLET TWICE DAILY, Disp: 180 tablet, Rfl: 0   TRUE METRIX BLOOD GLUCOSE TEST test strip, 1 each by Other route 4 (four) times daily as needed., Disp: , Rfl:    TRUEplus Lancets 33G MISC, Test BS up to four times daily as needed Dx  E11.9, Disp: 400 each, Rfl: 3   valsartan (DIOVAN) 320 MG tablet, Take 1 tablet (320 mg total) by mouth daily., Disp: 90 tablet, Rfl: 3   vitamin B-12 (CYANOCOBALAMIN) 1000 MCG tablet, Take 1,000 mcg by mouth daily., Disp: , Rfl:    Allergies  Allergen Reactions   Hctz [Hydrochlorothiazide]     Per GI may have caused pancreatitis. Dr. Ardis Hughs recommended never resuming.   Lisinopril     H/O pancreatitis.   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[place])    Diabetes ([place])    Diabetic nephropathy ([place]) 02/04/2020   GERD (gastroesophageal reflux disease)    Hemorrhoids    Hypertension    IBS (irritable bowel syndrome)    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   Qualifier: Diagnosis of  By: Zeb Comfort     Pancreatitis    Transient  cerebral ischemia 05/08/2008   Qualifier: Diagnosis of  By: Zeb Comfort      Past Surgical History:  Procedure Laterality Date   COLONOSCOPY  09/07/2007   RMR: anal canal/external hemorrhoids, redundant colon, left-sided diverticula, otherwise normal colonic mucosa    Social History   Socioeconomic History   Marital status: Widowed    Spouse name: Not on file   Number of children: 3   Years of education: Not on file   Highest education level: Not on file  Occupational History   Occupation: retired  Tobacco Use   Smoking status: Former    Years: 9.00    Types: Cigarettes    Quit date: 11/27/2006    Years since quitting: 14.2   Smokeless tobacco: Never  Vaping Use   Vaping Use: Never used  Substance and Sexual Activity   Alcohol use: No    Alcohol/week: 0.0 standard drinks   Drug use: No   Sexual activity: Not Currently  Other Topics Concern   Not on file  Social History Narrative   Three children.  All live nearby.   Social Determinants of Health   Financial Resource Strain: Low Risk    Difficulty of Paying Living Expenses: Not hard at all  Food Insecurity: No Food Insecurity   Worried About Charity fundraiser in the Last Year: Never true   Happy in the Last Year: Never true  Transportation Needs: No Transportation Needs   Lack of Transportation (Medical): No   Lack of Transportation (Non-Medical): No  Physical Activity: Inactive   Days of Exercise per Week: 0 days   Minutes of Exercise per Session: 0 min  Stress: No Stress Concern Present   Feeling of Stress : Not at all  Social Connections: Moderately Integrated   Frequency of Communication with Friends and Family: More than three times a week   Frequency of Social Gatherings with Friends and Family: More than three times a week   Attends Religious Services: More than 4 times per year   Active Member of [employer] or Organizations: Yes   Attends Archivist Meetings: More than 4 times per year    Marital Status: Widowed  Human resources officer Violence: Not At Risk   Fear of Current or Ex-Partner: No   Emotionally Abused: No   Physically Abused: No   Sexually Abused: No        Objective:    BP (!) 144/67    Pulse 76    Temp 97.6 F (36.4 C) (Temporal)    Ht '5\' 4"'  (  1.626 m)    Wt 155 lb (70.3 kg)    SpO2 94%    BMI 26.61 kg/m   Wt Readings from Last 3 Encounters:  03/03/21 155 lb (70.3 kg)  02/11/21 157 lb 11.2 oz (71.5 kg)  12/03/20 158 lb 6.4 oz (71.8 kg)    Physical Exam Vitals reviewed.  Constitutional:      General: She is not in acute distress.    Appearance: Normal appearance. She is overweight. She is not ill-appearing, toxic-appearing or diaphoretic.  HENT:     Head: Normocephalic and atraumatic.  Eyes:     General: No scleral icterus.       Right eye: No discharge.        Left eye: No discharge.     Conjunctiva/sclera: Conjunctivae normal.  Cardiovascular:     Rate and Rhythm: Normal rate and regular rhythm.     Heart sounds: Normal heart sounds. No murmur heard.   No friction rub. No gallop.  Pulmonary:     Effort: Pulmonary effort is normal. No respiratory distress.     Breath sounds: Normal breath sounds. No stridor. No wheezing, rhonchi or rales.  Musculoskeletal:        General: Normal range of motion.     Cervical back: Normal range of motion.  Skin:    General: Skin is warm and dry.     Capillary Refill: Capillary refill takes less than 2 seconds.  Neurological:     General: No focal deficit present.     Mental Status: She is alert and oriented to person, place, and time. Mental status is at baseline.  Psychiatric:        Mood and Affect: Mood normal.        Behavior: Behavior normal.        Thought Content: Thought content normal.        Judgment: Judgment normal.    Lab Results  Component Value Date   TSH 1.820 11/02/2018   Lab Results  Component Value Date   WBC 8.3 10/23/2020   HGB 13.7 10/23/2020   HCT 39.2 10/23/2020   MCV 84  10/23/2020   PLT 202 10/23/2020   Lab Results  Component Value Date   NA 141 12/03/2020   K 4.9 12/03/2020   CO2 25 12/03/2020   GLUCOSE 142 (H) 12/03/2020   BUN 10 12/03/2020   CREATININE 0.77 12/03/2020   BILITOT 0.6 10/23/2020   ALKPHOS 45 10/23/2020   AST 17 10/23/2020   ALT 12 10/23/2020   PROT 6.9 10/23/2020   ALBUMIN 4.6 10/23/2020   CALCIUM 10.5 (H) 12/03/2020   ANIONGAP 9 07/14/2018   EGFR 78 12/03/2020   Lab Results  Component Value Date   CHOL 156 10/23/2020   Lab Results  Component Value Date   HDL 32 (L) 10/23/2020   Lab Results  Component Value Date   LDLCALC 87 10/23/2020   Lab Results  Component Value Date   TRIG 220 (H) 10/23/2020   Lab Results  Component Value Date   CHOLHDL 4.9 (H) 10/23/2020   Lab Results  Component Value Date   HGBA1C 6.0 (H) 10/23/2020

## 2021-03-04 ENCOUNTER — Encounter: Payer: Self-pay | Admitting: Family Medicine

## 2021-03-04 LAB — CMP14+EGFR
ALT: 10 IU/L (ref 0–32)
AST: 15 IU/L (ref 0–40)
Albumin/Globulin Ratio: 2.1 (ref 1.2–2.2)
Albumin: 4.9 g/dL — ABNORMAL HIGH (ref 3.7–4.7)
Alkaline Phosphatase: 48 IU/L (ref 44–121)
BUN/Creatinine Ratio: 17 (ref 12–28)
BUN: 14 mg/dL (ref 8–27)
Bilirubin Total: 0.5 mg/dL (ref 0.0–1.2)
CO2: 24 mmol/L (ref 20–29)
Calcium: 10.4 mg/dL — ABNORMAL HIGH (ref 8.7–10.3)
Chloride: 101 mmol/L (ref 96–106)
Creatinine, Ser: 0.84 mg/dL (ref 0.57–1.00)
Globulin, Total: 2.3 g/dL (ref 1.5–4.5)
Glucose: 112 mg/dL — ABNORMAL HIGH (ref 70–99)
Potassium: 4.8 mmol/L (ref 3.5–5.2)
Sodium: 143 mmol/L (ref 134–144)
Total Protein: 7.2 g/dL (ref 6.0–8.5)
eGFR: 70 mL/min/1.73 (ref >59)

## 2021-03-04 LAB — CBC WITH DIFFERENTIAL/PLATELET
Basophils Absolute: 0.1 10*3/uL (ref 0.0–0.2)
Basos: 1 %
EOS (ABSOLUTE): 0.2 10*3/uL (ref 0.0–0.4)
Eos: 3 %
Hematocrit: 41.5 % (ref 34.0–46.6)
Hemoglobin: 13.8 g/dL (ref 11.1–15.9)
Immature Grans (Abs): 0 10*3/uL (ref 0.0–0.1)
Immature Granulocytes: 0 %
Lymphocytes Absolute: 1.8 10*3/uL (ref 0.7–3.1)
Lymphs: 21 %
MCH: 28.9 pg (ref 26.6–33.0)
MCHC: 33.3 g/dL (ref 31.5–35.7)
MCV: 87 fL (ref 79–97)
Monocytes Absolute: 0.5 10*3/uL (ref 0.1–0.9)
Monocytes: 6 %
Neutrophils Absolute: 6.1 10*3/uL (ref 1.4–7.0)
Neutrophils: 69 %
Platelets: 194 10*3/uL (ref 150–450)
RBC: 4.78 x10E6/uL (ref 3.77–5.28)
RDW: 13.7 % (ref 11.7–15.4)
WBC: 8.7 10*3/uL (ref 3.4–10.8)

## 2021-03-04 LAB — LIPID PANEL
Chol/HDL Ratio: 4.4 ratio (ref 0.0–4.4)
Cholesterol, Total: 155 mg/dL (ref 100–199)
HDL: 35 mg/dL — ABNORMAL LOW (ref >39)
LDL Chol Calc (NIH): 82 mg/dL (ref 0–99)
Triglycerides: 225 mg/dL — ABNORMAL HIGH (ref 0–149)
VLDL Cholesterol Cal: 38 mg/dL (ref 5–40)

## 2021-03-04 LAB — MICROALBUMIN / CREATININE URINE RATIO
Creatinine, Urine: 71.4 mg/dL
Microalb/Creat Ratio: 18 mg/g{creat} (ref 0–29)
Microalbumin, Urine: 12.6 ug/mL

## 2021-03-04 LAB — CORTISOL: Cortisol: 13.2 ug/dL

## 2021-03-04 LAB — TSH: TSH: 2.31 u[IU]/mL (ref 0.450–4.500)

## 2021-03-11 ENCOUNTER — Other Ambulatory Visit: Payer: Self-pay | Admitting: Family Medicine

## 2021-03-11 DIAGNOSIS — Z8673 Personal history of transient ischemic attack (TIA), and cerebral infarction without residual deficits: Secondary | ICD-10-CM

## 2021-03-11 DIAGNOSIS — I700 Atherosclerosis of aorta: Secondary | ICD-10-CM

## 2021-03-11 DIAGNOSIS — E1165 Type 2 diabetes mellitus with hyperglycemia: Secondary | ICD-10-CM

## 2021-03-11 DIAGNOSIS — E782 Mixed hyperlipidemia: Secondary | ICD-10-CM

## 2021-03-25 ENCOUNTER — Ambulatory Visit (HOSPITAL_BASED_OUTPATIENT_CLINIC_OR_DEPARTMENT_OTHER): Payer: Medicare HMO | Admitting: Cardiovascular Disease

## 2021-03-25 ENCOUNTER — Encounter (HOSPITAL_BASED_OUTPATIENT_CLINIC_OR_DEPARTMENT_OTHER): Payer: Self-pay | Admitting: Cardiovascular Disease

## 2021-03-25 ENCOUNTER — Other Ambulatory Visit: Payer: Self-pay

## 2021-03-25 DIAGNOSIS — I10 Essential (primary) hypertension: Secondary | ICD-10-CM | POA: Diagnosis not present

## 2021-03-25 DIAGNOSIS — E782 Mixed hyperlipidemia: Secondary | ICD-10-CM

## 2021-03-25 MED ORDER — VALSARTAN 320 MG PO TABS: 320.0000 mg | ORAL_TABLET | Freq: Every day | ORAL | 3 refills | Status: DC

## 2021-03-25 MED ORDER — BISOPROLOL FUMARATE 5 MG PO TABS: ORAL_TABLET | ORAL | 3 refills | Status: DC

## 2021-03-25 NOTE — Progress Notes (Signed)
? ?[facility] Follow-up:   ? ?Date:  04/21/2021  ? ?ID:  ANONA GIOVANNINI, DOB 1940-03-31, MRN [id number] ? ?PCP:  Gwenlyn Fudge, FNP  ?Cardiologist:  Rollene Rotunda, MD  ?Nephrologist: ? ?Referring MD: Gwenlyn Fudge, FNP  ? ?CC: Hypertension ? ?History of Present Illness:   ? ?Heather Oconnor is a 81 y.o. female with a hx of hypertension, chronic diastolic heart failure, COPD, diabetes, stroke, coronary and aortic atherosclerosis, here for follow-up. She was initially seen 02/11/2021 to establish care in the [facility]. She saw her PCP 11/2020 and her BP was 180/82 despite taking amlodipine, hydralazine, losartan, clonidine, and spironolactone. Spironolactone was increased and she was referred to [facility]. She previously saw Dr. Antoine Poche in 2021. At that time her blood pressure was running in the 140s-150s. He increased her hydralazine and recommended follow-up, but she hasn't been seen since that time. ? ?She reported that her blood pressure was more difficult to control since having pancreatitis. Her home blood pressures were very labile, ranging from the 130s-170s. Losartan was switched to valsartan. She was not on a thiazide diuretic due to history of pancreatitis. Renal artery dopplers were normal 02/2021. Today, she is accompanied by her daughter. Overall she is feeling good. She presents a BP log that confirms her blood pressure is higher in clinic (191/78 today). At home her readings range from the 120s-170s/60s-70s, averaging 130s-140s. Most of the time she rests before taking her blood pressure. She will check at random times throughout the day, making sure to have 1 AM and 1 PM reading. Her blood pressure tends to run higher in the evenings. For activity she remains active with house work. She plans to start walking for exercise in the warmer weather. With her diet she is usually conscientious of her salt intake. She denies any palpitations,  chest pain, shortness of breath, or peripheral edema. No lightheadedness, headaches, syncope, orthopnea, or PND. ? ?Previous antihypertensives: ? ? ?Past Medical History:  ?Diagnosis Date  ? Blind right eye   ? Cholecystitis   ? Cirrhosis (HCC)   ? Diabetes (HCC)   ? Diabetic nephropathy (HCC) 02/04/2020  ? GERD (gastroesophageal reflux disease)   ? Hemorrhoids   ? Hypertension   ? IBS (irritable bowel syndrome)   ? 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 Qualifier: Diagnosis of  By: Diana Eves    ? Pancreatitis   ? Transient cerebral ischemia 05/08/2008  ? Qualifier: Diagnosis of  By: Diana Eves    ? ? ?Past Surgical History:  ?Procedure Laterality Date  ? COLONOSCOPY  09/07/2007  ? RMR: anal canal/external hemorrhoids, redundant colon, left-sided diverticula, otherwise normal colonic mucosa  ? ? ?Current Medications: ?Current Meds  ?Medication Sig  ? acetaminophen (TYLENOL) 500 MG tablet Take 2 tablets (1,000 mg total) by mouth every 6 (six) hours as needed.  ? amLODipine (NORVASC) 10 MG tablet TAKE 1 TABLET EVERY DAY  ? atorvastatin (LIPITOR) 10 MG tablet TAKE 1 TABLET EVERY DAY  AT  6PM  ? bisoprolol (ZEBETA) 5 MG tablet TAKE 1/2 TABLET DAILY  ? cloNIDine (CATAPRES) 0.2 MG tablet Take 1 tablet (0.2 mg total) by mouth 2 (two) times daily.  ? clopidogrel (PLAVIX) 75 MG tablet TAKE 1 TABLET EVERY DAY  ? dorzolamide-timolol (COSOPT) 22.3-6.8 MG/ML ophthalmic solution Place 1 drop into both eyes 2 (two) times daily.  ? glipiZIDE (GLUCOTROL XL) 5 MG 24 hr tablet TAKE 1 TABLET EVERY DAY  ? hydrALAZINE (APRESOLINE) 100 MG  tablet Take 1 tablet (100 mg total) by mouth 3 (three) times daily.  ? metFORMIN (GLUCOPHAGE) 500 MG tablet TAKE 2 TABLETS TWICE DAILY WITH MEALS  ? mirtazapine (REMERON) 15 MG tablet TAKE 1 TABLET AT BEDTIME  ? pantoprazole (PROTONIX) 40 MG tablet TAKE 1 TABLET EVERY DAY  ? spironolactone (ALDACTONE) 50 MG tablet TAKE 1 TABLET TWICE DAILY  ? TRUE METRIX BLOOD GLUCOSE TEST test strip 1 each by Other  route 4 (four) times daily as needed.  ? TRUEplus Lancets 33G MISC Test BS up to four times daily as needed Dx E11.9  ? vitamin B-12 (CYANOCOBALAMIN) 1000 MCG tablet Take 1,000 mcg by mouth daily.  ? [DISCONTINUED] valsartan (DIOVAN) 320 MG tablet Take 1 tablet (320 mg total) by mouth daily.  ?  ? ?Allergies:   Hctz [hydrochlorothiazide] and Lisinopril  ? ?Social History  ? ?Socioeconomic History  ? Marital status: Widowed  ?  Spouse name: Not on file  ? Number of children: 3  ? Years of education: Not on file  ? Highest education level: Not on file  ?Occupational History  ? Occupation: retired  ?Tobacco Use  ? Smoking status: Former  ?  Years: 9.00  ?  Types: Cigarettes  ?  Quit date: 11/27/2006  ?  Years since quitting: 14.4  ? Smokeless tobacco: Never  ?Vaping Use  ? Vaping Use: Never used  ?Substance and Sexual Activity  ? Alcohol use: No  ?  Alcohol/week: 0.0 standard drinks  ? Drug use: No  ? Sexual activity: Not Currently  ?Other Topics Concern  ? Not on file  ?Social History Narrative  ? Three children.  All live nearby.  ? ?Social Determinants of Health  ? ?Financial Resource Strain: Low Risk   ? Difficulty of Paying Living Expenses: Not hard at all  ?Food Insecurity: No Food Insecurity  ? Worried About Programme researcher, broadcasting/film/videounning Out of Food in the Last Year: Never true  ? Ran Out of Food in the Last Year: Never true  ?Transportation Needs: No Transportation Needs  ? Lack of Transportation (Medical): No  ? Lack of Transportation (Non-Medical): No  ?Physical Activity: Inactive  ? Days of Exercise per Week: 0 days  ? Minutes of Exercise per Session: 0 min  ?Stress: No Stress Concern Present  ? Feeling of Stress : Not at all  ?Social Connections: Moderately Integrated  ? Frequency of Communication with Friends and Family: More than three times a week  ? Frequency of Social Gatherings with Friends and Family: More than three times a week  ? Attends Religious Services: More than 4 times per year  ? Active Member of Clubs or  Organizations: Yes  ? Attends BankerClub or Organization Meetings: More than 4 times per year  ? Marital Status: Widowed  ?  ? ?Family History: ?The patient's family history includes Diabetes in her son; Heart attack in her father; Hypertension in her brother, brother, father, mother, sister, and son. There is no history of Colon cancer. ? ?ROS:   ?Please see the history of present illness.    ?All other systems reviewed and are negative. ? ?EKGs/Labs/Other Studies Reviewed:   ? ?Bilateral Renal Artery Dopplers 02/24/2021: ?Summary:  ?Largest Aortic Diameter: 2.0 cm  ?   ?Renal:  ?   ?Right: Normal size right kidney. Abnormal right Resistive Index.  ?       Normal cortical thickness of right kidney. No evidence of  ?       right renal artery stenosis.  RRV flow present.  ?Left:  Normal size of left kidney. Abnormal left Resisitve Index.  ?       Normal cortical thickness of the left kidney. No evidence of  ?       left renal artery stenosis. LRV flow present.  ?Mesenteric:  ?Normal Celiac artery and Superior Mesenteric artery findings.  ? ?CTA Chest/Aorta 06/16/2018: ?COMPARISON:  PA and lateral chest 06/12/2018. Single-view of the ?chest 06/14/2018 and 06/13/2018. ? ?IMPRESSION: ?Negative for pulmonary embolus. ?  ?Small bilateral pleural effusions, greater on the left with ?associated mild compressive atelectasis. ?  ?Cardiomegaly. ?  ?Fatty infiltration of the liver. ?  ?Partial visualization of changes in the upper abdomen consistent ?with the patient's known pancreatitis. ?  ?Aortic Atherosclerosis (ICD10-I70.0) and Emphysema (ICD10-J43.9). ?Calcific coronary artery disease also noted. ? ?Echo 06/13/2018: ? 1. The left ventricle has normal systolic function, with an ejection  ?fraction of 55-60%. The cavity size was normal. There is mildly increased  ?left ventricular wall thickness. Left ventricular diastolic Doppler  ?parameters are consistent with impaired relaxation.  ? 2. The right ventricle has normal systolic  function. The cavity was  ?normal. There is no increase in right ventricular wall thickness. Right  ?ventricular systolic pressure could not be accurately assessed.  ? 3. Left atrial size was mild-moderately dilated.

## 2021-03-25 NOTE — Patient Instructions (Signed)
Medication Instructions:  ?Meds ordered this encounter  ?Medications  ? bisoprolol (ZEBETA) 5 MG tablet  ?  Sig: TAKE 1/2 TABLET DAILY  ?  Dispense:  45 tablet  ?  Refill:  3  ? ?Labwork: ?NONE ? ?Testing/Procedures: ?NONE ? ?Follow-Up: ?05/11/2021 AT 10:00 AM WITH DR Crestwood, TELEPHONE VISIT  ? ?Any Other Special Instructions Will Be Listed Below (If Applicable). ? ?CONTINUE TO MONITOR YOUR BLOOD PRESSURE AT HOME. HAVE YOUR LOG AVAILABLE AT YOUR FOLLOW UP VISIT  ?

## 2021-03-25 NOTE — Assessment & Plan Note (Signed)
Continue atorvastatin

## 2021-03-25 NOTE — Assessment & Plan Note (Addendum)
Blood pressure remains poorly controlled but much better.  It was much higher in the office today than at home.  She has malignant hypertension with superimposed white coat hypertension. BP at home was 137/68.  She brought her machine to the office today and it was accurate.  On average her blood pressure is in the 140s over 70s.  She has a few readings in the 120s but many in the 150s.  She is willing to try adding low-dose bisoprolol.  We will add 2.5 mg daily and she will keep tracking her blood pressures.  Continue amlodipine, clonidine, hydralazine, spironolactone, and valsartan.  She has difficulty coming into the office because it means her daughter has to take off of work.  She would like to do a telephone visit for follow-up.  Her daughter will fax a copy of her most recent blood pressure readings prior to the visit.  She will work on increasing her exercise. ?

## 2021-04-13 DIAGNOSIS — H401113 Primary open-angle glaucoma, right eye, severe stage: Secondary | ICD-10-CM | POA: Diagnosis not present

## 2021-04-13 DIAGNOSIS — Z7984 Long term (current) use of oral hypoglycemic drugs: Secondary | ICD-10-CM | POA: Diagnosis not present

## 2021-04-13 DIAGNOSIS — Z961 Presence of intraocular lens: Secondary | ICD-10-CM | POA: Diagnosis not present

## 2021-04-13 DIAGNOSIS — H401121 Primary open-angle glaucoma, left eye, mild stage: Secondary | ICD-10-CM | POA: Diagnosis not present

## 2021-04-13 DIAGNOSIS — E119 Type 2 diabetes mellitus without complications: Secondary | ICD-10-CM | POA: Diagnosis not present

## 2021-04-13 DIAGNOSIS — Z0100 Encounter for examination of eyes and vision without abnormal findings: Secondary | ICD-10-CM | POA: Diagnosis not present

## 2021-04-13 DIAGNOSIS — H2511 Age-related nuclear cataract, right eye: Secondary | ICD-10-CM | POA: Diagnosis not present

## 2021-04-21 ENCOUNTER — Encounter (HOSPITAL_BASED_OUTPATIENT_CLINIC_OR_DEPARTMENT_OTHER): Payer: Self-pay | Admitting: Cardiovascular Disease

## 2021-04-25 DIAGNOSIS — H1032 Unspecified acute conjunctivitis, left eye: Secondary | ICD-10-CM | POA: Diagnosis not present

## 2021-04-25 DIAGNOSIS — L089 Local infection of the skin and subcutaneous tissue, unspecified: Secondary | ICD-10-CM | POA: Diagnosis not present

## 2021-04-27 ENCOUNTER — Ambulatory Visit (INDEPENDENT_AMBULATORY_CARE_PROVIDER_SITE_OTHER): Payer: Medicare HMO | Admitting: Family Medicine

## 2021-04-27 ENCOUNTER — Ambulatory Visit: Payer: Medicare HMO | Admitting: Family Medicine

## 2021-04-27 ENCOUNTER — Encounter: Payer: Self-pay | Admitting: Family Medicine

## 2021-04-27 VITALS — BP 162/72 | HR 73 | Temp 97.8°F | Ht 64.0 in | Wt 155.2 lb

## 2021-04-27 DIAGNOSIS — H1032 Unspecified acute conjunctivitis, left eye: Secondary | ICD-10-CM | POA: Diagnosis not present

## 2021-04-27 DIAGNOSIS — L03211 Cellulitis of face: Secondary | ICD-10-CM | POA: Diagnosis not present

## 2021-04-27 NOTE — Progress Notes (Signed)
? ?BP (!) 162/72   Pulse 73   Temp 97.8 ?F (36.6 ?C) (Temporal)   Ht 5\' 4"  (1.626 m)   Wt 155 lb 3.2 oz (70.4 kg)   BMI 26.64 kg/m?   ? ?Subjective:  ? ?Patient ID: Heather Oconnor, female    DOB: May 04, 1940, 81 y.o.   MRN: [id number] ? ?HPI: ?Heather Oconnor is a 81 y.o. female presenting on 04/27/2021 for Conjunctivitis (Left eye- patient went to [facility] on 4/22 and was told to follow up.  ) ? ? ?HPI ?Left eye conjunctivitis and swelling follow-up ?Patient was seen at the urgent care for left eye conjunctivitis and swelling.  She was diagnosed with a conjunctivitis and some cellulitis and was given Augmentin for an antibiotic and also given Cipro drops which she has been doing for the past 2 days.  She does feel like the swelling around her eye is gone down although her eye still very pink and irritated.  She denies any visual disturbance.  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  She denies any pain or visual disturbance currently.  She denies any headache. ? ?Relevant past medical, surgical, family and social history reviewed and updated as indicated. Interim medical history since our last visit reviewed. ?Allergies and medications reviewed and updated. ? ?Review of Systems  ?Constitutional:  Negative for chills and fever.  ?Eyes:  Positive for discharge, redness and itching. Negative for pain and visual disturbance.  ?Respiratory:  Negative for chest tightness and shortness of breath.   ?Cardiovascular:  Negative for chest pain and leg swelling.  ?Musculoskeletal:  Negative for back pain and gait problem.  ?Skin:  Negative for rash.  ?Neurological:  Negative for light-headedness and headaches.  ?Psychiatric/Behavioral:  Negative for agitation and behavioral problems.   ?All other systems reviewed and are negative. ? ?Per HPI unless specifically indicated above ? ? ?Allergies as of 04/27/2021   ? ?   Reactions  ? Hctz  [hydrochlorothiazide]   ? Per GI may have caused pancreatitis. Dr. 04/29/2021 recommended never resuming.  ? Lisinopril   ? H/O pancreatitis.  ? ?  ? ?  ?Medication List  ?  ? ?  ? Accurate as of April 27, 2021 12:35 PM. If you have any questions, ask your nurse or doctor.  ?  ?  ? ?  ? ?acetaminophen 500 MG tablet ?Commonly known as: TYLENOL ?Take 2 tablets (1,000 mg total) by mouth every 6 (six) hours as needed. ?  ?amLODipine 10 MG tablet ?Commonly known as: NORVASC ?TAKE 1 TABLET EVERY DAY ?  ?amoxicillin-clavulanate 875-125 MG tablet ?Commonly known as: AUGMENTIN ?Take 1 tablet by mouth 2 (two) times daily. ?  ?atorvastatin 10 MG tablet ?Commonly known as: LIPITOR ?TAKE 1 TABLET EVERY DAY  AT  6PM ?  ?bisoprolol 5 MG tablet ?Commonly known as: ZEBETA ?TAKE 1/2 TABLET DAILY ?  ?ciprofloxacin 0.3 % ophthalmic solution ?Commonly known as: CILOXAN ?Administer 1 drop into the left eye every two (2) hours. X 2 days them 1 drop  3 times a day x 5 days. ?  ?cloNIDine 0.2 MG tablet ?Commonly known as: CATAPRES ?Take 1 tablet (0.2 mg total) by mouth 2 (two) times daily. ?  ?clopidogrel 75 MG tablet ?Commonly known as: PLAVIX ?TAKE 1 TABLET EVERY DAY ?  ?dorzolamide-timolol 22.3-6.8 MG/ML ophthalmic solution ?Commonly known as: COSOPT ?Place 1 drop into both eyes 2 (two)  times daily. ?  ?glipiZIDE 5 MG 24 hr tablet ?Commonly known as: GLUCOTROL XL ?TAKE 1 TABLET EVERY DAY ?  ?hydrALAZINE 100 MG tablet ?Commonly known as: APRESOLINE ?Take 1 tablet (100 mg total) by mouth 3 (three) times daily. ?  ?metFORMIN 500 MG tablet ?Commonly known as: GLUCOPHAGE ?TAKE 2 TABLETS TWICE DAILY WITH MEALS ?  ?mirtazapine 15 MG tablet ?Commonly known as: REMERON ?TAKE 1 TABLET AT BEDTIME ?  ?pantoprazole 40 MG tablet ?Commonly known as: PROTONIX ?TAKE 1 TABLET EVERY DAY ?  ?spironolactone 50 MG tablet ?Commonly known as: ALDACTONE ?TAKE 1 TABLET TWICE DAILY ?  ?True Metrix Blood Glucose Test test strip ?Generic drug: glucose blood ?1 each by  Other route 4 (four) times daily as needed. ?  ?TRUEplus Lancets 33G Misc ?Test BS up to four times daily as needed Dx E11.9 ?  ?valsartan 320 MG tablet ?Commonly known as: DIOVAN ?Take 1 tablet (320 mg total) by mouth daily. ?  ?vitamin B-12 1000 MCG tablet ?Commonly known as: CYANOCOBALAMIN ?Take 1,000 mcg by mouth daily. ?  ? ?  ? ? ? ?Objective:  ? ?BP (!) 162/72   Pulse 73   Temp 97.8 ?F (36.6 ?C) (Temporal)   Ht 5\' 4"  (1.626 m)   Wt 155 lb 3.2 oz (70.4 kg)   BMI 26.64 kg/m?   ?Wt Readings from Last 3 Encounters:  ?04/27/21 155 lb 3.2 oz (70.4 kg)  ?03/25/21 158 lb 8 oz (71.9 kg)  ?03/03/21 155 lb (70.3 kg)  ?  ?Physical Exam ?Vitals and nursing note reviewed.  ?Constitutional:   ?   Appearance: Normal appearance.  ?Eyes:  ?   Extraocular Movements: Extraocular movements intact.  ?   Conjunctiva/sclera:  ?   Right eye: Right conjunctiva is not injected. No exudate or hemorrhage. ?   Left eye: Left conjunctiva is injected. No exudate or hemorrhage. ?   Pupils: Pupils are equal, round, and reactive to light.  ?   Visual Fields: Right eye visual fields normal and left eye visual fields normal.  ?Neurological:  ?   Mental Status: She is alert.  ? ? ? ? ?Assessment & Plan:  ? ?Problem List Items Addressed This Visit   ?None ?Visit Diagnoses   ? ? Acute conjunctivitis of left eye, unspecified acute conjunctivitis type    -  Primary  ? Cellulitis of face      ? ?  ?  ?Continue the antibiotics, per patient the swelling and redness has gone down some. ?Follow up plan: ?Return if symptoms worsen or fail to improve. ? ?Counseling provided for all of the vaccine components ?No orders of the defined types were placed in this encounter. ? ? ?03/05/21, MD ?[facility] ?04/27/2021, 12:35 PM ? ? ? ? ?

## 2021-05-10 ENCOUNTER — Encounter (HOSPITAL_BASED_OUTPATIENT_CLINIC_OR_DEPARTMENT_OTHER): Payer: Self-pay | Admitting: Cardiovascular Disease

## 2021-05-11 ENCOUNTER — Ambulatory Visit (INDEPENDENT_AMBULATORY_CARE_PROVIDER_SITE_OTHER): Payer: Medicare HMO | Admitting: Cardiovascular Disease

## 2021-05-11 ENCOUNTER — Encounter (HOSPITAL_BASED_OUTPATIENT_CLINIC_OR_DEPARTMENT_OTHER): Payer: Self-pay | Admitting: Cardiovascular Disease

## 2021-05-11 DIAGNOSIS — I700 Atherosclerosis of aorta: Secondary | ICD-10-CM

## 2021-05-11 DIAGNOSIS — I10 Essential (primary) hypertension: Secondary | ICD-10-CM

## 2021-05-11 DIAGNOSIS — Z8673 Personal history of transient ischemic attack (TIA), and cerebral infarction without residual deficits: Secondary | ICD-10-CM | POA: Diagnosis not present

## 2021-05-11 MED ORDER — BISOPROLOL FUMARATE 5 MG PO TABS
ORAL_TABLET | ORAL | 3 refills | Status: DC
Start: 2021-05-11 — End: 2021-09-03

## 2021-05-11 NOTE — Assessment & Plan Note (Signed)
Blood pressure remains uncontrolled but is significantly improved from prior.  She is congratulated on increasing her exercise.  Continue amlodipine, clonidine, valsartan hydralazine, and spironolactone.  We will increase the bisoprolol to 5 mg.  She will take it later in the day given that she takes too many once a day medicines in the morning.  Continue to limit sodium intake.  She is not on a thiazide diuretic due to a history of pancreatitis. ?

## 2021-05-11 NOTE — Progress Notes (Signed)
? ?Advanced Hypertension Clinic Follow-up:   ? ?Date:  05/11/2021  ? ?ID:  Heather Oconnor, DOB Nov 12, 1940, MRN [id number] ? ?PCP:  Gwenlyn Fudge, FNP  ?Cardiologist:  Rollene Rotunda, MD  ?Nephrologist: ? ?Referring MD: Gwenlyn Fudge, FNP  ? ?CC: Hypertension ? ?History of Present Illness:   ? ?Heather Oconnor is a 81 y.o. female with a hx of hypertension, chronic diastolic heart failure, COPD, diabetes, stroke, coronary and aortic atherosclerosis, here for follow-up. She was initially seen 02/11/2021 to establish care in the [facility]pertension Clinic. She saw her PCP 11/2020 and her BP was 180/82 despite taking amlodipine, hydralazine, losartan, clonidine, and spironolactone. Spironolactone was increased and she was referred to [facility]. She previously saw Dr. Antoine Poche in 2021. At that time her blood pressure was running in the 140s-150s. He increased her hydralazine and recommended follow-up, but she hasn't been seen since that time. ? ?She reported that her blood pressure was more difficult to control since having pancreatitis. Her home blood pressures were very labile, ranging from the 130s-170s. Losartan was switched to valsartan. She was not on a thiazide diuretic due to history of pancreatitis. Renal artery dopplers were normal 02/2021. Today, she is accompanied by her daughter. Overall she is feeling good. She presents a BP log that confirms her blood pressure is higher in clinic (191/78 today). At home her readings range from the 120s-170s/60s-70s, averaging 130s-140s.  At the last appointment bisoprolol 2.5 mg was added. ? ?She sent a copy of her blood pressure log showing that her blood pressures have been relatively low in the mornings, mostly in the 120sOver 60s but then are higher in the afternoon, typically from the 140s to 150s over 70s.  She does have some occasional blood pressures in the 100s over 110s.  She is afraid about increasing her blood pressure medications  because it may drop her too low.  She typically takes all of her once per day medications in the mornings.  She then checks her blood pressures about an hour or 2 after taking her morning medicine.  Last week she started walking daily.  She notes that she has more energy.  She denies any exertional chest pain or shortness of breath.  She has no lower extremity edema, orthopnea, or PND. ? ?Past Medical History:  ?Diagnosis Date  ? Blind right eye   ? Cholecystitis   ? Cirrhosis (HCC)   ? Diabetes (HCC)   ? Diabetic nephropathy (HCC) 02/04/2020  ? GERD (gastroesophageal reflux disease)   ? Hemorrhoids   ? Hypertension   ? IBS (irritable bowel syndrome)   ? 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 Qualifier: Diagnosis of  By: Diana Eves    ? Pancreatitis   ? Transient cerebral ischemia 05/08/2008  ? Qualifier: Diagnosis of  By: Diana Eves    ? ? ?Past Surgical History:  ?Procedure Laterality Date  ? COLONOSCOPY  09/07/2007  ? RMR: anal canal/external hemorrhoids, redundant colon, left-sided diverticula, otherwise normal colonic mucosa  ? ? ?Current Medications: ?Current Meds  ?Medication Sig  ? acetaminophen (TYLENOL) 500 MG tablet Take 2 tablets (1,000 mg total) by mouth every 6 (six) hours as needed.  ? amLODipine (NORVASC) 10 MG tablet TAKE 1 TABLET EVERY DAY  ? amoxicillin-clavulanate (AUGMENTIN) 875-125 MG tablet Take 1 tablet by mouth 2 (two) times daily.  ? atorvastatin (LIPITOR) 10 MG tablet TAKE 1 TABLET EVERY DAY  AT  6PM  ? bisoprolol (ZEBETA) 5 MG tablet TAKE 1/2  TABLET DAILY  ? ciprofloxacin (CILOXAN) 0.3 % ophthalmic solution Administer 1 drop into the left eye every two (2) hours. X 2 days them 1 drop  3 times a day x 5 days.  ? cloNIDine (CATAPRES) 0.2 MG tablet Take 1 tablet (0.2 mg total) by mouth 2 (two) times daily.  ? clopidogrel (PLAVIX) 75 MG tablet TAKE 1 TABLET EVERY DAY  ? dorzolamide-timolol (COSOPT) 22.3-6.8 MG/ML ophthalmic solution Place 1 drop into both eyes 2 (two) times daily.  ?  glipiZIDE (GLUCOTROL XL) 5 MG 24 hr tablet TAKE 1 TABLET EVERY DAY  ? hydrALAZINE (APRESOLINE) 100 MG tablet Take 1 tablet (100 mg total) by mouth 3 (three) times daily.  ? metFORMIN (GLUCOPHAGE) 500 MG tablet TAKE 2 TABLETS TWICE DAILY WITH MEALS  ? mirtazapine (REMERON) 15 MG tablet TAKE 1 TABLET AT BEDTIME  ? pantoprazole (PROTONIX) 40 MG tablet TAKE 1 TABLET EVERY DAY  ? spironolactone (ALDACTONE) 50 MG tablet TAKE 1 TABLET TWICE DAILY  ? TRUE METRIX BLOOD GLUCOSE TEST test strip 1 each by Other route 4 (four) times daily as needed.  ? TRUEplus Lancets 33G MISC Test BS up to four times daily as needed Dx E11.9  ? valsartan (DIOVAN) 320 MG tablet Take 1 tablet (320 mg total) by mouth daily.  ? vitamin B-12 (CYANOCOBALAMIN) 1000 MCG tablet Take 1,000 mcg by mouth daily.  ?  ? ?Allergies:   Hctz [hydrochlorothiazide] and Lisinopril  ? ?Social History  ? ?Socioeconomic History  ? Marital status: Widowed  ?  Spouse name: Not on file  ? Number of children: 3  ? Years of education: Not on file  ? Highest education level: Not on file  ?Occupational History  ? Occupation: retired  ?Tobacco Use  ? Smoking status: Former  ?  Years: 9.00  ?  Types: Cigarettes  ?  Quit date: 11/27/2006  ?  Years since quitting: 14.4  ? Smokeless tobacco: Never  ?Vaping Use  ? Vaping Use: Never used  ?Substance and Sexual Activity  ? Alcohol use: No  ?  Alcohol/week: 0.0 standard drinks  ? Drug use: No  ? Sexual activity: Not Currently  ?Other Topics Concern  ? Not on file  ?Social History Narrative  ? Three children.  All live nearby.  ? ?Social Determinants of Health  ? ?Financial Resource Strain: Low Risk   ? Difficulty of Paying Living Expenses: Not hard at all  ?Food Insecurity: No Food Insecurity  ? Worried About Programme researcher, broadcasting/film/videounning Out of Food in the Last Year: Never true  ? Ran Out of Food in the Last Year: Never true  ?Transportation Needs: No Transportation Needs  ? Lack of Transportation (Medical): No  ? Lack of Transportation (Non-Medical):  No  ?Physical Activity: Inactive  ? Days of Exercise per Week: 0 days  ? Minutes of Exercise per Session: 0 min  ?Stress: No Stress Concern Present  ? Feeling of Stress : Not at all  ?Social Connections: Moderately Integrated  ? Frequency of Communication with Friends and Family: More than three times a week  ? Frequency of Social Gatherings with Friends and Family: More than three times a week  ? Attends Religious Services: More than 4 times per year  ? Active Member of Clubs or Organizations: Yes  ? Attends BankerClub or Organization Meetings: More than 4 times per year  ? Marital Status: Widowed  ?  ? ?Family History: ?The patient's family history includes Diabetes in her son; Heart attack in her father;  Hypertension in her brother, brother, father, mother, sister, and son. There is no history of Colon cancer. ? ?ROS:   ?Please see the history of present illness.    ?All other systems reviewed and are negative. ? ?EKGs/Labs/Other Studies Reviewed:   ? ?Bilateral Renal Artery Dopplers 02/24/2021: ?Summary:  ?Largest Aortic Diameter: 2.0 cm  ?   ?Renal:  ?   ?Right: Normal size right kidney. Abnormal right Resistive Index.  ?       Normal cortical thickness of right kidney. No evidence of  ?       right renal artery stenosis. RRV flow present.  ?Left:  Normal size of left kidney. Abnormal left Resisitve Index.  ?       Normal cortical thickness of the left kidney. No evidence of  ?       left renal artery stenosis. LRV flow present.  ?Mesenteric:  ?Normal Celiac artery and Superior Mesenteric artery findings.  ? ?CTA Chest/Aorta 06/16/2018: ?COMPARISON:  PA and lateral chest 06/12/2018. Single-view of the ?chest 06/14/2018 and 06/13/2018. ? ?IMPRESSION: ?Negative for pulmonary embolus. ?  ?Small bilateral pleural effusions, greater on the left with ?associated mild compressive atelectasis. ?  ?Cardiomegaly. ?  ?Fatty infiltration of the liver. ?  ?Partial visualization of changes in the upper abdomen consistent ?with the  patient's known pancreatitis. ?  ?Aortic Atherosclerosis (ICD10-I70.0) and Emphysema (ICD10-J43.9). ?Calcific coronary artery disease also noted. ? ?Echo 06/13/2018: ? 1. The left ventricle has normal systolic functi

## 2021-05-11 NOTE — Assessment & Plan Note (Signed)
Continue clopidogrel and atorvastatin 

## 2021-05-11 NOTE — Assessment & Plan Note (Signed)
Continue atorvastatin.  LDL goal is less than 70. 

## 2021-05-11 NOTE — Patient Instructions (Signed)
Medication Instructions:  ?INCREASE BISOPROLOL TO 5 MG IN THE AFTERNOON  ? ?*If you need a refill on your cardiac medications before your next appointment, please call your pharmacy* ? ?Lab Work: ?NONE  ? ?Testing/Procedures: ?NONE ? ?Follow-Up: ?At [facility], you and your health needs are our priority.  As part of our continuing mission to provide you with exceptional heart care, we have created designated Provider Care Teams.  These Care Teams include your primary Cardiologist (physician) and Advanced Practice Providers (APPs -  Physician Assistants and Nurse Practitioners) who all work together to provide you with the care you need, when you need it. ? ?We recommend signing up for the patient portal called "MyChart".  Sign up information is provided on this After Visit Summary.  MyChart is used to connect with patients for Virtual Visits (Telemedicine).  Patients are able to view lab/test results, encounter notes, upcoming appointments, etc.  Non-urgent messages can be sent to your provider as well.   ?To learn more about what you can do with MyChart, go to [email or web address].   ? ?Your next appointment:   ?06/09/2021 8:40 TELEPHONE VISIT WITH DR [place]  ? ?  ? ? ?

## 2021-05-11 NOTE — Telephone Encounter (Signed)
Patient BP log, please advise for any changes ?

## 2021-06-04 ENCOUNTER — Other Ambulatory Visit: Payer: Self-pay | Admitting: Family Medicine

## 2021-06-04 DIAGNOSIS — I10 Essential (primary) hypertension: Secondary | ICD-10-CM

## 2021-06-04 DIAGNOSIS — E1165 Type 2 diabetes mellitus with hyperglycemia: Secondary | ICD-10-CM

## 2021-06-04 DIAGNOSIS — K2100 Gastro-esophageal reflux disease with esophagitis, without bleeding: Secondary | ICD-10-CM

## 2021-06-04 DIAGNOSIS — I5032 Chronic diastolic (congestive) heart failure: Secondary | ICD-10-CM

## 2021-06-04 DIAGNOSIS — Z8673 Personal history of transient ischemic attack (TIA), and cerebral infarction without residual deficits: Secondary | ICD-10-CM

## 2021-06-07 ENCOUNTER — Encounter (HOSPITAL_BASED_OUTPATIENT_CLINIC_OR_DEPARTMENT_OTHER): Payer: Self-pay | Admitting: Cardiovascular Disease

## 2021-06-08 NOTE — Telephone Encounter (Signed)
Blood pressure log

## 2021-06-09 ENCOUNTER — Ambulatory Visit (INDEPENDENT_AMBULATORY_CARE_PROVIDER_SITE_OTHER): Payer: Medicare HMO | Admitting: Cardiovascular Disease

## 2021-06-09 ENCOUNTER — Encounter (HOSPITAL_BASED_OUTPATIENT_CLINIC_OR_DEPARTMENT_OTHER): Payer: Self-pay | Admitting: Cardiovascular Disease

## 2021-06-09 DIAGNOSIS — I5032 Chronic diastolic (congestive) heart failure: Secondary | ICD-10-CM | POA: Diagnosis not present

## 2021-06-09 DIAGNOSIS — I10 Essential (primary) hypertension: Secondary | ICD-10-CM | POA: Diagnosis not present

## 2021-06-09 DIAGNOSIS — I700 Atherosclerosis of aorta: Secondary | ICD-10-CM | POA: Diagnosis not present

## 2021-06-09 NOTE — Patient Instructions (Addendum)
Medication Instructions:  Your physician recommends that you continue on your current medications as directed. Please refer to the Current Medication list given to you today.   *If you need a refill on your cardiac medications before your next appointment, please call your pharmacy*  Lab Work: NONE  Testing/Procedures: NONE  Follow-Up: At [facility], you and your health needs are our priority.  As part of our continuing mission to provide you with exceptional heart care, we have created designated Provider Care Teams.  These Care Teams include your primary Cardiologist (physician) and Advanced Practice Providers (APPs -  Physician Assistants and Nurse Practitioners) who all work together to provide you with the care you need, when you need it.  We recommend signing up for the patient portal called "MyChart".  Sign up information is provided on this After Visit Summary.  MyChart is used to connect with patients for Virtual Visits (Telemedicine).  Patients are able to view lab/test results, encounter notes, upcoming appointments, etc.  Non-urgent messages can be sent to your provider as well.   To learn more about what you can do with MyChart, go to [email or web address].    Your next appointment:    10/14/2021 at 8:40 am with Dr Duke Salvia in office

## 2021-06-09 NOTE — Assessment & Plan Note (Signed)
Her blood pressure has stabilized significantly.  Her blood pressures are now averaging in the 120s to 130s both in the mornings and the evenings.  She is feeling excellent and walking regularly.  She was congratulated on her new healthy habit and encouraged to keep it up.  Continue amlodipine, bisoprolol, clonidine, hydralazine, spironolactone, and valsartan.

## 2021-06-09 NOTE — Assessment & Plan Note (Signed)
She has been euvolemic and feeling well.  Blood pressures are much better controlled.  Continue current regimen.

## 2021-06-09 NOTE — Assessment & Plan Note (Signed)
Lipids are very well-controlled on atorvastatin. 

## 2021-06-09 NOTE — Progress Notes (Signed)
[facility] Virtual Follow-up:    Date:  06/09/2021   ID:  Heather Oconnor, Strader February 08, 1940, MRN [id number]  PCP:  Gwenlyn Fudge, FNP  Cardiologist:  Rollene Rotunda, MD   Referring MD: Gwenlyn Fudge, FNP   CC: Hypertension  Patient location: Home MD location: Office Visit type: Phone   History of Present Illness:    Heather Oconnor is a 81 y.o. female with a hx of hypertension, chronic diastolic heart failure, COPD, diabetes, stroke, coronary and aortic atherosclerosis, here for follow-up. She was initially seen 02/11/2021 to establish care in the [facility]. She saw her PCP 11/2020 and her BP was 180/82 despite taking amlodipine, hydralazine, losartan, clonidine, and spironolactone. Spironolactone was increased and she was referred to [facility]. She previously saw Dr. Antoine Poche in 2021. At that time her blood pressure was running in the 140s-150s. He increased her hydralazine and recommended follow-up, but she hasn't been seen since that time.  She reported that her blood pressure was more difficult to control since having pancreatitis. Her home blood pressures were very labile, ranging from the 130s-170s. Losartan was switched to valsartan. She was not on a thiazide diuretic due to history of pancreatitis. Renal artery dopplers were normal 02/2021. Visoperal was added, at the last appointment her BP was controlled in the morning and high in the afternoon. Vioperal was increased and she started taking them later in the day.  Today, she states that she is doing well.  BP this morning was 97/50 with a heart ate of 68, but her blood pressure improved to 135 systolic after walking.  She had no lightheadedness or dizziness.  In general her blood pressures have been much better controlled.  Lately they have been in the 100s to 140s in the mornings and 120s to 130s in the evenings.  It averages in the 120s to 130s in the next.  She is  feeling great.  For exercise, she has been walking 30 minutes, 5 days a week since last visit. The patient denies chest pain, shortness of breath, nocturnal dyspnea, orthopnea or peripheral edema.  There have been no palpitations, lightheadedness or syncope.  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   Qualifier: Diagnosis of  By: Diana Eves     Pancreatitis    Transient cerebral ischemia 05/08/2008   Qualifier: Diagnosis of  By: Diana Eves      Past Surgical History:  Procedure Laterality Date   COLONOSCOPY  09/07/2007   RMR: anal canal/external hemorrhoids, redundant colon, left-sided diverticula, otherwise normal colonic mucosa    Current Medications: Current Meds  Medication Sig   acetaminophen (TYLENOL) 500 MG tablet Take 2 tablets (1,000 mg total) by mouth every 6 (six) hours as needed.   amLODipine (NORVASC) 10 MG tablet TAKE 1 TABLET EVERY DAY   amoxicillin-clavulanate (AUGMENTIN) 875-125 MG tablet Take 1 tablet by mouth 2 (two) times daily.   atorvastatin (LIPITOR) 10 MG tablet TAKE 1 TABLET EVERY DAY  AT  6PM   bisoprolol (ZEBETA) 5 MG tablet TAKE 1 TABLET IN THE AFTERNOON   ciprofloxacin (CILOXAN) 0.3 % ophthalmic solution Administer 1 drop into the left eye every two (  2) hours. X 2 days them 1 drop  3 times a day x 5 days.   cloNIDine (CATAPRES) 0.2 MG tablet Take 1 tablet (0.2 mg total) by mouth 2 (two) times daily.   clopidogrel (PLAVIX) 75 MG tablet TAKE 1 TABLET EVERY DAY   dorzolamide-timolol (COSOPT) 22.3-6.8 MG/ML ophthalmic solution Place 1 drop into both eyes 2 (two) times daily.   glipiZIDE (GLUCOTROL XL) 5 MG 24 hr tablet TAKE 1 TABLET EVERY DAY   hydrALAZINE (APRESOLINE) 100 MG tablet Take 1 tablet (100  mg total) by mouth 3 (three) times daily.   metFORMIN (GLUCOPHAGE) 500 MG tablet TAKE 2 TABLETS TWICE DAILY WITH MEALS   mirtazapine (REMERON) 15 MG tablet TAKE 1 TABLET AT BEDTIME   pantoprazole (PROTONIX) 40 MG tablet TAKE 1 TABLET EVERY DAY   spironolactone (ALDACTONE) 50 MG tablet TAKE 1 TABLET TWICE DAILY   TRUE METRIX BLOOD GLUCOSE TEST test strip 1 each by Other route 4 (four) times daily as needed.   TRUEplus Lancets 33G MISC Test BS up to four times daily as needed Dx E11.9   valsartan (DIOVAN) 320 MG tablet Take 1 tablet (320 mg total) by mouth daily.   vitamin B-12 (CYANOCOBALAMIN) 1000 MCG tablet Take 1,000 mcg by mouth daily.     Allergies:   Hctz [hydrochlorothiazide] and Lisinopril   Social History   Socioeconomic History   Marital status: Widowed    Spouse name: Not on file   Number of children: 3   Years of education: Not on file   Highest education level: Not on file  Occupational History   Occupation: retired  Tobacco Use   Smoking status: Former    Years: 9.00    Types: Cigarettes    Quit date: 11/27/2006    Years since quitting: 14.5   Smokeless tobacco: Never  Vaping Use   Vaping Use: Never used  Substance and Sexual Activity   Alcohol use: No    Alcohol/week: 0.0 standard drinks   Drug use: No   Sexual activity: Not Currently  Other Topics Concern   Not on file  Social History Narrative   Three children.  All live nearby.   Social Determinants of Health   Financial Resource Strain: Low Risk    Difficulty of Paying Living Expenses: Not hard at all  Food Insecurity: No Food Insecurity   Worried About Programme researcher, broadcasting/film/videounning Out of Food in the Last Year: Never true   Ran Out of Food in the Last Year: Never true  Transportation Needs: No Transportation Needs   Lack of Transportation (Medical): No   Lack of Transportation (Non-Medical): No  Physical Activity: Inactive   Days of Exercise per Week: 0 days   Minutes of Exercise per Session: 0 min  Stress: No  Stress Concern Present   Feeling of Stress : Not at all  Social Connections: Moderately Integrated   Frequency of Communication with Friends and Family: More than three times a week   Frequency of Social Gatherings with Friends and Family: More than three times a week   Attends Religious Services: More than 4 times per year   Active Member of [employer] or Organizations: Yes   Attends BankerClub or Organization Meetings: More than 4 times per year   Marital Status: Widowed     Family History: The patient's family history includes Diabetes in her son; Heart attack in her father; Hypertension in her brother, brother, father, mother, sister, and son. There is no history of Colon cancer.  ROS:  Please see the history of present illness.    All other systems reviewed and are negative.  EKGs/Labs/Other Studies Reviewed:    Bilateral Renal Artery Dopplers 02/24/2021: Summary:  Largest Aortic Diameter: 2.0 cm     Renal:     Right: Normal size right kidney. Abnormal right Resistive Index.         Normal cortical thickness of right kidney. No evidence of         right renal artery stenosis. RRV flow present.  Left:  Normal size of left kidney. Abnormal left Resisitve Index.         Normal cortical thickness of the left kidney. No evidence of         left renal artery stenosis. LRV flow present.  Mesenteric:  Normal Celiac artery and Superior Mesenteric artery findings.   CTA Chest/Aorta 06/16/2018: COMPARISON:  PA and lateral chest 06/12/2018. Single-view of the chest 06/14/2018 and 06/13/2018.  IMPRESSION: Negative for pulmonary embolus.   Small bilateral pleural effusions, greater on the left with associated mild compressive atelectasis.   Cardiomegaly.   Fatty infiltration of the liver.   Partial visualization of changes in the upper abdomen consistent with the patient's known pancreatitis.   Aortic Atherosclerosis (ICD10-I70.0) and Emphysema (ICD10-J43.9). Calcific coronary artery  disease also noted.  Echo 06/13/2018:  1. The left ventricle has normal systolic function, with an ejection  fraction of 55-60%. The cavity size was normal. There is mildly increased  left ventricular wall thickness. Left ventricular diastolic Doppler  parameters are consistent with impaired relaxation.   2. The right ventricle has normal systolic function. The cavity was  normal. There is no increase in right ventricular wall thickness. Right  ventricular systolic pressure could not be accurately assessed.   3. Left atrial size was mild-moderately dilated.   4. The aortic valve is tricuspid. Mild calcification of the aortic valve.  Mild aortic annular calcification noted. Valve appears moderately scleroic  but not stenotic.   5. The mitral valve is grossly normal. There is mild mitral annular  calcification present.   6. The tricuspid valve is grossly normal.   7. The aortic root is normal in size and structure.   EKG:  03/25/2021: EKG was not ordered. 02/11/2021: Sinus rhythm. Rate 78 bpm.  Recent Labs: 03/03/2021: ALT 10; BUN 14; Creatinine, Ser 0.84; Hemoglobin 13.8; Platelets 194; Potassium 4.8; Sodium 143; TSH 2.310   Recent Lipid Panel    Component Value Date/Time   CHOL 155 03/03/2021 0957   TRIG 225 (H) 03/03/2021 0957   HDL 35 (L) 03/03/2021 0957   CHOLHDL 4.4 03/03/2021 0957   CHOLHDL 4.6 06/12/2018 0815   VLDL 27 06/12/2018 0815   LDLCALC 82 03/03/2021 0957    Physical Exam:    BP 132/61   Pulse 70   Ht 5\' 4"  (1.626 m)   Wt 153 lb (69.4 kg)   BMI 26.26 kg/m  RESP: Respirations unlabored NEURO:  Speech fluent.   PSYCH:  Cognitively intact, oriented to person place and time  ASSESSMENT/PLAN:    Malignant hypertension Her blood pressure has stabilized significantly.  Her blood pressures are now averaging in the 120s to 130s both in the mornings and the evenings.  She is feeling excellent and walking regularly.  She was congratulated on her new healthy habit and  encouraged to keep it up.  Continue amlodipine, bisoprolol, clonidine, hydralazine, spironolactone, and valsartan.  Atherosclerosis of aorta (HCC) Lipids are very well controlled on atorvastatin.  Chronic diastolic  CHF (congestive heart failure) (HCC) She has been euvolemic and feeling well.  Blood pressures are much better controlled.  Continue current regimen.  Time spent: 18 minutes-Greater than 50% of this time was spent in counseling, explanation of diagnosis, planning of further management, and coordination of care.   Screening for Secondary Hypertension:     02/11/2021    4:28 PM  Causes  Drugs/Herbals Screened  Renovascular HTN Screened     - Comments Check renal artery Dopplers  Sleep Apnea Screened     - Comments No symptoms  Thyroid Disease Screened  Hyperaldosteronism Not Screened     - Comments She is already on spironolactone 50 mg with no improvement in her blood pressure  Pheochromocytoma N/A     - Comments No symptoms  Cushing's Syndrome N/A     - Comments No symptoms  Hyperparathyroidism Screened  Coarctation of the Aorta Screened     - Comments Blood pressure symmetric  Compliance Screened    Relevant Labs/Studies:    Latest Ref Rng & Units 03/03/2021    9:57 AM 12/03/2020   11:09 AM 10/23/2020    9:17 AM  Basic Labs  Sodium 134 - 144 mmol/L 143   141   142    Potassium 3.5 - 5.2 mmol/L 4.8   4.9   4.2    Creatinine 0.57 - 1.00 mg/dL 4.54   0.98   1.19         Latest Ref Rng & Units 03/03/2021   10:01 AM 11/02/2018    4:11 PM  Thyroid   TSH 0.450 - 4.500 uIU/mL 2.310   1.820                  02/24/2021   10:42 AM  Renovascular   Renal Artery [place] Completed Yes    Disposition:    FU with Mclean Moya C. Duke Salvia, MD, [facility] in 4 month with in-person  visit.   Medication Adjustments/Labs and Tests Ordered: Current medicines are reviewed at length with the patient today.  Concerns regarding medicines are outlined above.   No orders of the defined  types were placed in this encounter.  No orders of the defined types were placed in this encounter.    I,Tinashe Williams,acting as a Neurosurgeon for [employer], MD.,have documented all relevant documentation on the behalf of Chilton Si, MD,as directed by  Chilton Si, MD while in the presence of Chilton Si, MD.   I, Harlean Regula C. Duke Salvia, MD have reviewed all documentation for this visit.  The documentation of the exam, diagnosis, procedures, and orders on 06/09/2021 are all accurate and complete.

## 2021-06-25 NOTE — Telephone Encounter (Signed)
Patient was seen by Dr Duke Salvia 05/11/2021 and 06/2021

## 2021-07-06 DIAGNOSIS — H401113 Primary open-angle glaucoma, right eye, severe stage: Secondary | ICD-10-CM | POA: Diagnosis not present

## 2021-07-06 DIAGNOSIS — H401121 Primary open-angle glaucoma, left eye, mild stage: Secondary | ICD-10-CM | POA: Diagnosis not present

## 2021-07-12 ENCOUNTER — Other Ambulatory Visit: Payer: Self-pay | Admitting: Family Medicine

## 2021-07-12 DIAGNOSIS — I10 Essential (primary) hypertension: Secondary | ICD-10-CM

## 2021-08-12 NOTE — Telephone Encounter (Signed)
Patient was seen by Dr Duke Salvia 6/6

## 2021-09-01 ENCOUNTER — Encounter: Payer: Self-pay | Admitting: Family Medicine

## 2021-09-01 ENCOUNTER — Ambulatory Visit (INDEPENDENT_AMBULATORY_CARE_PROVIDER_SITE_OTHER): Payer: Medicare HMO | Admitting: Family Medicine

## 2021-09-01 VITALS — BP 149/79 | HR 68 | Temp 97.8°F | Ht 64.0 in | Wt 154.6 lb

## 2021-09-01 DIAGNOSIS — I10 Essential (primary) hypertension: Secondary | ICD-10-CM

## 2021-09-01 DIAGNOSIS — I5032 Chronic diastolic (congestive) heart failure: Secondary | ICD-10-CM | POA: Diagnosis not present

## 2021-09-01 DIAGNOSIS — E782 Mixed hyperlipidemia: Secondary | ICD-10-CM | POA: Diagnosis not present

## 2021-09-01 DIAGNOSIS — E1121 Type 2 diabetes mellitus with diabetic nephropathy: Secondary | ICD-10-CM | POA: Diagnosis not present

## 2021-09-01 DIAGNOSIS — E1165 Type 2 diabetes mellitus with hyperglycemia: Secondary | ICD-10-CM | POA: Diagnosis not present

## 2021-09-01 DIAGNOSIS — K7460 Unspecified cirrhosis of liver: Secondary | ICD-10-CM | POA: Diagnosis not present

## 2021-09-01 DIAGNOSIS — F321 Major depressive disorder, single episode, moderate: Secondary | ICD-10-CM

## 2021-09-01 DIAGNOSIS — E538 Deficiency of other specified B group vitamins: Secondary | ICD-10-CM | POA: Diagnosis not present

## 2021-09-01 DIAGNOSIS — J449 Chronic obstructive pulmonary disease, unspecified: Secondary | ICD-10-CM

## 2021-09-01 DIAGNOSIS — Z8673 Personal history of transient ischemic attack (TIA), and cerebral infarction without residual deficits: Secondary | ICD-10-CM

## 2021-09-01 DIAGNOSIS — G479 Sleep disorder, unspecified: Secondary | ICD-10-CM

## 2021-09-01 DIAGNOSIS — K2100 Gastro-esophageal reflux disease with esophagitis, without bleeding: Secondary | ICD-10-CM

## 2021-09-01 DIAGNOSIS — I700 Atherosclerosis of aorta: Secondary | ICD-10-CM

## 2021-09-01 LAB — BAYER DCA HB A1C WAIVED: HB A1C (BAYER DCA - WAIVED): 5.7 % — ABNORMAL HIGH (ref 4.8–5.6)

## 2021-09-01 MED ORDER — ATORVASTATIN CALCIUM 10 MG PO TABS: ORAL_TABLET | ORAL | 1 refills | Status: DC

## 2021-09-01 MED ORDER — GLIPIZIDE ER 5 MG PO TB24: 5.0000 mg | ORAL_TABLET | Freq: Every day | ORAL | 1 refills | Status: DC

## 2021-09-01 MED ORDER — CLOPIDOGREL BISULFATE 75 MG PO TABS: 75.0000 mg | ORAL_TABLET | Freq: Every day | ORAL | 1 refills | Status: DC

## 2021-09-01 MED ORDER — AMLODIPINE BESYLATE 10 MG PO TABS: 10.0000 mg | ORAL_TABLET | Freq: Every day | ORAL | 1 refills | Status: DC

## 2021-09-01 MED ORDER — CLONIDINE HCL 0.2 MG PO TABS
0.2000 mg | ORAL_TABLET | Freq: Two times a day (BID) | ORAL | 1 refills | Status: DC
Start: 2021-09-01 — End: 2022-03-04

## 2021-09-01 MED ORDER — MIRTAZAPINE 15 MG PO TABS: 15.0000 mg | ORAL_TABLET | Freq: Every day | ORAL | 1 refills | Status: DC

## 2021-09-01 NOTE — Progress Notes (Signed)
Assessment & Plan:  1. Type 2 diabetes mellitus with hyperglycemia, without long-term current use of insulin (HCC) Lab Results  Component Value Date   HGBA1C 5.7 (H) 09/01/2021   HGBA1C 6.0 (H) 03/03/2021   HGBA1C 6.0 (H) 10/23/2020    - Diabetes is at goal of A1c < 7. - Medications: continue current medications - Home glucose monitoring: continue monitoring. - Patient is currently taking a statin. Patient is taking an ACE-inhibitor/ARB.  - Eye exam requested from [facility] Maintenance Due  Topic Date Due   OPHTHALMOLOGY EXAM  07/20/2020   HEMOGLOBIN A1C  03/04/2022   FOOT EXAM  09/02/2022    Lab Results  Component Value Date   LABMICR 12.6 03/03/2021   LABMICR 30.7 01/29/2020   - Lipid panel - CBC with Differential/Platelet - CMP14+EGFR - Bayer DCA Hb A1c Waived - Vitamin B12 - atorvastatin (LIPITOR) 10 MG tablet; 1 tablet every day  Dispense: 90 tablet; Refill: 1 - glipiZIDE (GLUCOTROL XL) 5 MG 24 hr tablet; Take 1 tablet (5 mg total) by mouth daily.  Dispense: 90 tablet; Refill: 1  2. Diabetic nephropathy associated with type 2 diabetes mellitus ([place]) Diabetes is well controlled. Patient is on an ARB. - CMP14+EGFR  3. B12 deficiency - Vitamin B12  4. Chronic diastolic CHF (congestive heart failure) (HCC) Well controlled on current regimen.  - Lipid panel - CBC with Differential/Platelet - CMP14+EGFR - amLODipine (NORVASC) 10 MG tablet; Take 1 tablet (10 mg total) by mouth daily.  Dispense: 90 tablet; Refill: 1  5. Malignant hypertension Well controlled on current regimen. Managed by hypertension clinic. - Lipid panel - CBC with Differential/Platelet - CMP14+EGFR - amLODipine (NORVASC) 10 MG tablet; Take 1 tablet (10 mg total) by mouth daily.  Dispense: 90 tablet; Refill: 1 - cloNIDine (CATAPRES) 0.2 MG tablet; Take 1 tablet (0.2 mg total) by mouth 2 (two) times daily.  Dispense: 180 tablet; Refill: 1  6. Mixed  hyperlipidemia Well controlled on current regimen.  - Lipid panel - CBC with Differential/Platelet - CMP14+EGFR - atorvastatin (LIPITOR) 10 MG tablet; 1 tablet every day  Dispense: 90 tablet; Refill: 1  7. Atherosclerosis of aorta (HCC) Continue current regimen. - Lipid panel - atorvastatin (LIPITOR) 10 MG tablet; 1 tablet every day  Dispense: 90 tablet; Refill: 1  8. History of CVA in adulthood Continue current regimen. - atorvastatin (LIPITOR) 10 MG tablet; 1 tablet every day  Dispense: 90 tablet; Refill: 1 - clopidogrel (PLAVIX) 75 MG tablet; Take 1 tablet (75 mg total) by mouth daily.  Dispense: 90 tablet; Refill: 1  9. COPD without exacerbation ([place]) Well controlled.  10. Gastroesophageal reflux disease with esophagitis, unspecified whether hemorrhage Well controlled on current regimen.  - CMP14+EGFR  11. Moderate major depression ([place]) Well controlled on current regimen.  - CMP14+EGFR - mirtazapine (REMERON) 15 MG tablet; Take 1 tablet (15 mg total) by mouth at bedtime.  Dispense: 90 tablet; Refill: 1  12. Difficulty sleeping Well controlled on current regimen.  - CMP14+EGFR - mirtazapine (REMERON) 15 MG tablet; Take 1 tablet (15 mg total) by mouth at bedtime.  Dispense: 90 tablet; Refill: 1   Return in about 4 months (around 01/01/2022) for follow-up of chronic medication conditions with T. Lilia Pro.  Hendricks Limes, MSN, APRN, FNP-C [facility]  Subjective:    Patient ID: Heather Oconnor, female    DOB: 1940/12/25, 81 y.o.   MRN: [id number]  Patient Care Team: Loman Brooklyn, FNP  as PCP - General (Family Medicine) Minus Breeding, MD as PCP - Cardiology (Cardiology) Daneil Dolin, MD as Consulting Physician (Gastroenterology) Bond, Tracie Harrier, MD as Consulting Physician (Ophthalmology)   Chief Complaint:  Chief Complaint  Patient presents with   Medical Management of Chronic Issues    HPI: Heather Oconnor is a 81 y.o.  female presenting on 09/01/2021 for Medical Management of Chronic Issues  Diabetes: Patient presents for follow up of diabetes. Current symptoms include: hyperglycemia. Known diabetic complications: nephropathy, cardiovascular disease and cerebrovascular disease. Medication compliance: yes. Current diet: in general, a "healthy" diet  . Current exercise: none. Home blood sugar records:  140-160s . Is she  on ACE inhibitor or angiotensin II receptor blocker? Yes (Valsartan). Is she on a statin? Yes (Atorvastatin).   Hypertension: managed by the hypertension clinic; she saw Dr. Oval Linsey on 06/09/2021 - no medication changes at that time.  CHF: denies shortness of breath and swelling.   Hyperlipidemia/Atherosclerosis of aorta/History of CVA: taking Atorvastatin and Plavix.   GERD: taking pantoprazole.  COPD: She is not taking any medication for her COPD. She states she is doing well and is not having episodes of shortness of breath and uses an incentive spirometer to exercise her lungs. She does not want any respiratory medications ordered.   Depression/Difficulty sleeping: doing well with Remeron. She tried to come off of it, but did not do well so it was restarted.     09/01/2021    9:50 AM 04/27/2021   12:08 PM 03/03/2021    9:42 AM  Depression screen PHQ 2/9  Decreased Interest 0 0 0  Down, Depressed, Hopeless 0 0 0  PHQ - 2 Score 0 0 0  Altered sleeping 0  0  Tired, decreased energy 1  1  Change in appetite 0  0  Feeling bad or failure about yourself  0  0  Trouble concentrating 0  0  Moving slowly or fidgety/restless 0  0  Suicidal thoughts 0  0  PHQ-9 Score 1  1  Difficult doing work/chores Not difficult at all  Not difficult at all    New complaints: None   Social history:  Relevant past medical, surgical, family and social history reviewed and updated as indicated. Interim medical history since our last visit reviewed.  Allergies and medications reviewed and  updated.  DATA REVIEWED: CHART IN EPIC  ROS: Negative unless specifically indicated above in HPI.    Current Outpatient Medications:    acetaminophen (TYLENOL) 500 MG tablet, Take 2 tablets (1,000 mg total) by mouth every 6 (six) hours as needed., Disp: 12 tablet, Rfl: 0   amLODipine (NORVASC) 10 MG tablet, TAKE 1 TABLET EVERY DAY, Disp: 90 tablet, Rfl: 0   atorvastatin (LIPITOR) 10 MG tablet, TAKE 1 TABLET EVERY DAY  AT  6PM, Disp: 90 tablet, Rfl: 1   bisoprolol (ZEBETA) 5 MG tablet, TAKE 1 TABLET IN THE AFTERNOON, Disp: 90 tablet, Rfl: 3   ciprofloxacin (CILOXAN) 0.3 % ophthalmic solution, Administer 1 drop into the left eye every two (2) hours. X 2 days them 1 drop  3 times a day x 5 days., Disp: , Rfl:    cloNIDine (CATAPRES) 0.2 MG tablet, Take 1 tablet (0.2 mg total) by mouth 2 (two) times daily., Disp: 180 tablet, Rfl: 1   clopidogrel (PLAVIX) 75 MG tablet, TAKE 1 TABLET EVERY DAY, Disp: 90 tablet, Rfl: 0   dorzolamide-timolol (COSOPT) 22.3-6.8 MG/ML ophthalmic solution, Place 1 drop into both eyes 2 (  two) times daily., Disp: , Rfl:    glipiZIDE (GLUCOTROL XL) 5 MG 24 hr tablet, TAKE 1 TABLET EVERY DAY, Disp: 90 tablet, Rfl: 0   hydrALAZINE (APRESOLINE) 100 MG tablet, Take 1 tablet (100 mg total) by mouth 3 (three) times daily., Disp: 270 tablet, Rfl: 1   metFORMIN (GLUCOPHAGE) 500 MG tablet, TAKE 2 TABLETS TWICE DAILY WITH MEALS, Disp: 360 tablet, Rfl: 1   mirtazapine (REMERON) 15 MG tablet, TAKE 1 TABLET AT BEDTIME, Disp: 90 tablet, Rfl: 0   pantoprazole (PROTONIX) 40 MG tablet, TAKE 1 TABLET EVERY DAY, Disp: 90 tablet, Rfl: 0   spironolactone (ALDACTONE) 50 MG tablet, TAKE 1 TABLET TWICE DAILY, Disp: 180 tablet, Rfl: 0   TRUE METRIX BLOOD GLUCOSE TEST test strip, 1 each by Other route 4 (four) times daily as needed., Disp: , Rfl:    TRUEplus Lancets 33G MISC, Test BS up to four times daily as needed Dx E11.9, Disp: 400 each, Rfl: 3   valsartan (DIOVAN) 320 MG tablet, Take 1 tablet  (320 mg total) by mouth daily., Disp: 90 tablet, Rfl: 3   vitamin B-12 (CYANOCOBALAMIN) 1000 MCG tablet, Take 1,000 mcg by mouth daily., Disp: , Rfl:    Allergies  Allergen Reactions   Hctz [Hydrochlorothiazide]     Per GI may have caused pancreatitis. Dr. Ardis Hughs recommended never resuming.   Lisinopril     H/O pancreatitis.   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[place])    Diabetes ([place])    Diabetic nephropathy ([place]) 02/04/2020   GERD (gastroesophageal reflux disease)    Hemorrhoids    Hypertension    IBS (irritable bowel syndrome)    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   Qualifier: Diagnosis of  By: Zeb Comfort     Pancreatitis    Transient cerebral ischemia 05/08/2008   Qualifier: Diagnosis of  By: Zeb Comfort      Past Surgical History:  Procedure Laterality Date   COLONOSCOPY  09/07/2007   RMR: anal canal/external hemorrhoids, redundant colon, left-sided diverticula, otherwise normal colonic mucosa    Social History   Socioeconomic History   Marital status: Widowed    Spouse name: Not on file   Number of children: 3   Years of education: Not on file   Highest education level: Not on file  Occupational History   Occupation: retired  Tobacco Use   Smoking status: Former    Years: 9.00    Types: Cigarettes    Quit date: 11/27/2006    Years since quitting: 14.7   Smokeless tobacco: Never  Vaping Use   Vaping Use: Never used  Substance and Sexual Activity   Alcohol use: No    Alcohol/week: 0.0 standard drinks of alcohol   Drug use: No   Sexual activity: Not Currently  Other Topics Concern   Not on file  Social History Narrative   Three children.  All live nearby.   Social Determinants of Health   Financial Resource Strain: Low Risk  (11/13/2020)   Overall Financial Resource Strain (CARDIA)    Difficulty of Paying Living Expenses: Not hard at all  Food Insecurity: No Food Insecurity (11/13/2020)   Hunger  Vital Sign    Worried About Running Out of Food in the Last Year: Never true    Ran Out of Food in the Last Year: Never true  Transportation Needs: No Transportation Needs (11/13/2020)   PRAPARE - Transportation  Lack of Transportation (Medical): No    Lack of Transportation (Non-Medical): No  Physical Activity: Inactive (11/13/2020)   Exercise Vital Sign    Days of Exercise per Week: 0 days    Minutes of Exercise per Session: 0 min  Stress: No Stress Concern Present (11/13/2020)   [place]    Feeling of Stress : Not at all  Social Connections: Moderately Integrated (11/13/2020)   Social Connection and Isolation Panel [NHANES]    Frequency of Communication with Friends and Family: More than three times a week    Frequency of Social Gatherings with Friends and Family: More than three times a week    Attends Religious Services: More than 4 times per year    Active Member of [employer] or Organizations: Yes    Attends Archivist Meetings: More than 4 times per year    Marital Status: Widowed  Intimate Partner Violence: Not At Risk (11/13/2020)   Humiliation, Afraid, Rape, and Kick questionnaire    Fear of Current or Ex-Partner: No    Emotionally Abused: No    Physically Abused: No    Sexually Abused: No        Objective:    BP (!) 149/79   Pulse 68   Temp 97.8 F (36.6 C) (Temporal)   Ht _0  (1.626 m)   Wt 154 lb 9.6 oz (70.1 kg)   SpO2 96%   BMI 26.54 kg/m   Wt Readings from Last 3 Encounters:  09/01/21 154 lb 9.6 oz (70.1 kg)  06/09/21 153 lb (69.4 kg)  05/11/21 154 lb (69.9 kg)    Physical Exam Vitals reviewed.  Constitutional:      General: She is not in acute distress.    Appearance: Normal appearance. She is overweight. She is not ill-appearing, toxic-appearing or diaphoretic.  HENT:     Head: Normocephalic and atraumatic.  Eyes:     General: No scleral icterus.       Right eye:  No discharge.        Left eye: No discharge.     Conjunctiva/sclera: Conjunctivae normal.  Cardiovascular:     Rate and Rhythm: Normal rate and regular rhythm.     Heart sounds: Normal heart sounds. No murmur heard.    No friction rub. No gallop.  Pulmonary:     Effort: Pulmonary effort is normal. No respiratory distress.     Breath sounds: Normal breath sounds. No stridor. No wheezing, rhonchi or rales.  Musculoskeletal:        General: Normal range of motion.     Cervical back: Normal range of motion.  Skin:    General: Skin is warm and dry.     Capillary Refill: Capillary refill takes less than 2 seconds.  Neurological:     General: No focal deficit present.     Mental Status: She is alert and oriented to person, place, and time. Mental status is at baseline.  Psychiatric:        Mood and Affect: Mood normal.        Behavior: Behavior normal.        Thought Content: Thought content normal.        Judgment: Judgment normal.    Diabetic Foot Exam - Simple   Simple Foot Form Diabetic Foot exam was performed with the following findings: Yes 09/01/2021 10:01 AM  Visual Inspection No deformities, no ulcerations, no other skin breakdown bilaterally: Yes Sensation Testing Intact  to touch and monofilament testing bilaterally: Yes Pulse Check Posterior Tibialis and Dorsalis pulse intact bilaterally: Yes Comments     Lab Results  Component Value Date   TSH 2.310 03/03/2021   Lab Results  Component Value Date   WBC 8.7 03/03/2021   HGB 13.8 03/03/2021   HCT 41.5 03/03/2021   MCV 87 03/03/2021   PLT 194 03/03/2021   Lab Results  Component Value Date   NA 143 03/03/2021   K 4.8 03/03/2021   CO2 24 03/03/2021   GLUCOSE 112 (H) 03/03/2021   BUN 14 03/03/2021   CREATININE 0.84 03/03/2021   BILITOT 0.5 03/03/2021   ALKPHOS 48 03/03/2021   AST 15 03/03/2021   ALT 10 03/03/2021   PROT 7.2 03/03/2021   ALBUMIN 4.9 (H) 03/03/2021   CALCIUM 10.4 (H) 03/03/2021   ANIONGAP  9 07/14/2018   EGFR 70 03/03/2021   Lab Results  Component Value Date   CHOL 155 03/03/2021   Lab Results  Component Value Date   HDL 35 (L) 03/03/2021   Lab Results  Component Value Date   LDLCALC 82 03/03/2021   Lab Results  Component Value Date   TRIG 225 (H) 03/03/2021   Lab Results  Component Value Date   CHOLHDL 4.4 03/03/2021   Lab Results  Component Value Date   HGBA1C 6.0 (H) 03/03/2021

## 2021-09-02 ENCOUNTER — Other Ambulatory Visit: Payer: Medicare HMO

## 2021-09-02 ENCOUNTER — Other Ambulatory Visit: Payer: Self-pay | Admitting: Family Medicine

## 2021-09-02 DIAGNOSIS — E875 Hyperkalemia: Secondary | ICD-10-CM | POA: Diagnosis not present

## 2021-09-02 LAB — CMP14+EGFR
ALT: 9 IU/L (ref 0–32)
AST: 14 IU/L (ref 0–40)
Albumin/Globulin Ratio: 2.1 (ref 1.2–2.2)
Albumin: 4.7 g/dL (ref 3.8–4.8)
Alkaline Phosphatase: 52 IU/L (ref 44–121)
BUN/Creatinine Ratio: 18 (ref 12–28)
BUN: 20 mg/dL (ref 8–27)
Bilirubin Total: 0.3 mg/dL (ref 0.0–1.2)
CO2: 22 mmol/L (ref 20–29)
Calcium: 10.3 mg/dL (ref 8.7–10.3)
Chloride: 104 mmol/L (ref 96–106)
Creatinine, Ser: 1.11 mg/dL — ABNORMAL HIGH (ref 0.57–1.00)
Globulin, Total: 2.2 g/dL (ref 1.5–4.5)
Glucose: 103 mg/dL — ABNORMAL HIGH (ref 70–99)
Potassium: 5.9 mmol/L (ref 3.5–5.2)
Sodium: 139 mmol/L (ref 134–144)
Total Protein: 6.9 g/dL (ref 6.0–8.5)
eGFR: 50 mL/min/{1.73_m2} — ABNORMAL LOW (ref >59)

## 2021-09-02 LAB — CBC WITH DIFFERENTIAL/PLATELET
Basophils Absolute: 0 10*3/uL (ref 0.0–0.2)
Basos: 1 %
EOS (ABSOLUTE): 0.2 10*3/uL (ref 0.0–0.4)
Eos: 3 %
Hematocrit: 36.5 % (ref 34.0–46.6)
Hemoglobin: 12.4 g/dL (ref 11.1–15.9)
Immature Grans (Abs): 0 10*3/uL (ref 0.0–0.1)
Immature Granulocytes: 0 %
Lymphocytes Absolute: 1.7 10*3/uL (ref 0.7–3.1)
Lymphs: 21 %
MCH: 30.2 pg (ref 26.6–33.0)
MCHC: 34 g/dL (ref 31.5–35.7)
MCV: 89 fL (ref 79–97)
Monocytes Absolute: 0.7 10*3/uL (ref 0.1–0.9)
Monocytes: 8 %
Neutrophils Absolute: 5.6 10*3/uL (ref 1.4–7.0)
Neutrophils: 67 %
Platelets: 197 10*3/uL (ref 150–450)
RBC: 4.1 x10E6/uL (ref 3.77–5.28)
RDW: 13.1 % (ref 11.7–15.4)
WBC: 8.2 10*3/uL (ref 3.4–10.8)

## 2021-09-02 LAB — BMP8+EGFR
BUN/Creatinine Ratio: 20 (ref 12–28)
BUN: 26 mg/dL (ref 8–27)
CO2: 21 mmol/L (ref 20–29)
Calcium: 10.2 mg/dL (ref 8.7–10.3)
Chloride: 105 mmol/L (ref 96–106)
Creatinine, Ser: 1.32 mg/dL — ABNORMAL HIGH (ref 0.57–1.00)
Glucose: 77 mg/dL (ref 70–99)
Potassium: 5.7 mmol/L — ABNORMAL HIGH (ref 3.5–5.2)
Sodium: 140 mmol/L (ref 134–144)
eGFR: 41 mL/min/{1.73_m2} — ABNORMAL LOW (ref >59)

## 2021-09-02 LAB — LIPID PANEL
Chol/HDL Ratio: 4.6 ratio — ABNORMAL HIGH (ref 0.0–4.4)
Cholesterol, Total: 153 mg/dL (ref 100–199)
HDL: 33 mg/dL — ABNORMAL LOW (ref >39)
LDL Chol Calc (NIH): 85 mg/dL (ref 0–99)
Triglycerides: 205 mg/dL — ABNORMAL HIGH (ref 0–149)
VLDL Cholesterol Cal: 35 mg/dL (ref 5–40)

## 2021-09-02 LAB — VITAMIN B12: Vitamin B-12: 405 pg/mL (ref 232–1245)

## 2021-09-03 ENCOUNTER — Encounter: Payer: Self-pay | Admitting: Family Medicine

## 2021-09-03 DIAGNOSIS — I10 Essential (primary) hypertension: Secondary | ICD-10-CM

## 2021-09-03 MED ORDER — BISOPROLOL FUMARATE 5 MG PO TABS
7.5000 mg | ORAL_TABLET | Freq: Every day | ORAL | 1 refills | Status: DC
Start: 2021-09-03 — End: 2021-09-30

## 2021-09-03 MED ORDER — SPIRONOLACTONE 25 MG PO TABS: 25.0000 mg | ORAL_TABLET | Freq: Two times a day (BID) | ORAL | 2 refills | Status: DC

## 2021-09-30 ENCOUNTER — Other Ambulatory Visit: Payer: Self-pay | Admitting: Family Medicine

## 2021-09-30 DIAGNOSIS — I10 Essential (primary) hypertension: Secondary | ICD-10-CM

## 2021-10-07 DIAGNOSIS — H401121 Primary open-angle glaucoma, left eye, mild stage: Secondary | ICD-10-CM | POA: Diagnosis not present

## 2021-10-07 DIAGNOSIS — H401113 Primary open-angle glaucoma, right eye, severe stage: Secondary | ICD-10-CM | POA: Diagnosis not present

## 2021-10-13 ENCOUNTER — Telehealth: Payer: Self-pay | Admitting: Cardiovascular Disease

## 2021-10-13 ENCOUNTER — Encounter (HOSPITAL_BASED_OUTPATIENT_CLINIC_OR_DEPARTMENT_OTHER): Payer: Self-pay | Admitting: Cardiovascular Disease

## 2021-10-13 NOTE — Telephone Encounter (Signed)
Pt states that her daughter who was bringing her was sick and can no longer bring her and was wanting to know if appt for tomorrow 10/11 at 8:40 A.M. could be changed to a virtual appt. Please advise.

## 2021-10-13 NOTE — Telephone Encounter (Signed)
Changed to telephone visit, ok per Dr Oval Linsey

## 2021-10-14 ENCOUNTER — Ambulatory Visit (INDEPENDENT_AMBULATORY_CARE_PROVIDER_SITE_OTHER): Payer: Medicare HMO | Admitting: Cardiovascular Disease

## 2021-10-14 ENCOUNTER — Encounter (HOSPITAL_BASED_OUTPATIENT_CLINIC_OR_DEPARTMENT_OTHER): Payer: Self-pay | Admitting: Cardiovascular Disease

## 2021-10-14 DIAGNOSIS — I10 Essential (primary) hypertension: Secondary | ICD-10-CM | POA: Diagnosis not present

## 2021-10-14 DIAGNOSIS — I5032 Chronic diastolic (congestive) heart failure: Secondary | ICD-10-CM | POA: Diagnosis not present

## 2021-10-14 DIAGNOSIS — Z8673 Personal history of transient ischemic attack (TIA), and cerebral infarction without residual deficits: Secondary | ICD-10-CM

## 2021-10-14 DIAGNOSIS — E1165 Type 2 diabetes mellitus with hyperglycemia: Secondary | ICD-10-CM

## 2021-10-14 DIAGNOSIS — E782 Mixed hyperlipidemia: Secondary | ICD-10-CM | POA: Diagnosis not present

## 2021-10-14 DIAGNOSIS — I700 Atherosclerosis of aorta: Secondary | ICD-10-CM

## 2021-10-14 MED ORDER — ATORVASTATIN CALCIUM 20 MG PO TABS: ORAL_TABLET | ORAL | 3 refills | Status: DC

## 2021-10-14 NOTE — Assessment & Plan Note (Signed)
Lipids are uncontrolled.  Increase atorvastatin to 20mg .  Repeat fasting lipids/CMP in 2-3 months.

## 2021-10-14 NOTE — Assessment & Plan Note (Signed)
She has resistant hypertension that is now controlled.  Continue current regimen as above.

## 2021-10-14 NOTE — Progress Notes (Signed)
Cardiology Telephone Visit:   Date:  10/14/2021   ID:  Heather Oconnor, Heather Oconnor 06-13-1940, MRN [id number]  PCP:  Gwenlyn Fudge, FNP  Cardiologist:  Rollene Rotunda, MD   Referring MD: Gwenlyn Fudge, FNP   CC: Hypertension  Patient location: Home MD location: Office Visit type: Phone   History of Present Illness:    Heather Oconnor is a 81 y.o. female with a hx of hypertension, chronic diastolic heart failure, COPD, diabetes, stroke, coronary and aortic atherosclerosis, here for follow-up. She was initially seen 02/11/2021 to establish care in [facility]. She saw her PCP 11/2020 and her BP was 180/82 despite taking amlodipine, hydralazine, losartan, clonidine, and spironolactone. Spironolactone was increased and she was referred to [facility]. She previously saw Dr. Antoine Poche in 2021. At that time her blood pressure was running in the 140s-150s. He increased her hydralazine and recommended follow-up, but she hasn't been seen since that time.  She reported that her blood pressure was more difficult to control since having pancreatitis. Her home blood pressures were very labile, ranging from the 130s-170s. Losartan was switched to valsartan. She was not on a thiazide diuretic due to history of pancreatitis. Renal artery dopplers were normal 02/2021. Visoperal was added, at the last appointment her BP was controlled in the morning and high in the afternoon. Vioperal was increased and she started taking them later in the day.  She stated that she was doing well. Her blood pressures were somewhat labile but overall better controlled. She submitted her most recent blood pressure readings, which have been mostly in the 120s/50-60s with rare readings in the 110s, 130s, and 140s.   Today, she says she is doing good. She reports there are some occasions where she feels short of breath, but this does not occur often or really limit her. Her blood pressure  upon her at home check today is 122/60, and her heart rate was 65. She states she is performing formal exercise for about 21 minutes for five days a week. She feels better with this physical activity and feels more strength in her legs. She denies any palpitations, chest pain, or peripheral edema. No lightheadedness, headaches, syncope, orthopnea, or PND.  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   Qualifier: Diagnosis of  By: Diana Eves     Pancreatitis    Transient cerebral ischemia 05/08/2008   Qualifier: Diagnosis of  By: Diana Eves      Past Surgical History:  Procedure Laterality Date   COLONOSCOPY  09/07/2007   RMR: anal canal/external hemorrhoids, redundant colon, left-sided diverticula, otherwise normal colonic mucosa    Current Medications: Current Meds  Medication Sig   acetaminophen (TYLENOL) 500 MG tablet Take 2 tablets (1,000 mg total) by mouth every 6 (six) hours as needed.   amLODipine (NORVASC) 10 MG tablet Take 1 tablet (10 mg total) by mouth daily.   bisoprolol (ZEBETA) 5 MG tablet TAKE 1 AND 1/2 TABLETS EVERY DAY   ciprofloxacin (CILOXAN) 0.3 % ophthalmic solution Administer 1 drop into the left eye every two (2) hours. X 2 days them 1 drop  3 times a day x 5 days.  cloNIDine (CATAPRES) 0.2 MG tablet Take 1 tablet (0.2 mg total) by mouth 2 (two) times daily.   clopidogrel (PLAVIX) 75 MG tablet Take 1 tablet (75 mg total) by mouth daily.   dorzolamide-timolol (COSOPT) 22.3-6.8 MG/ML ophthalmic solution Place 1 drop into both eyes 2 (two) times daily.   glipiZIDE (GLUCOTROL XL) 5 MG 24 hr tablet Take 1 tablet (5 mg total) by mouth daily.   hydrALAZINE (APRESOLINE) 100 MG tablet Take 1 tablet (100 mg total) by  mouth 3 (three) times daily.   metFORMIN (GLUCOPHAGE) 500 MG tablet TAKE 2 TABLETS TWICE DAILY WITH MEALS   mirtazapine (REMERON) 15 MG tablet Take 1 tablet (15 mg total) by mouth at bedtime.   pantoprazole (PROTONIX) 40 MG tablet TAKE 1 TABLET EVERY DAY   spironolactone (ALDACTONE) 25 MG tablet TAKE 1 TABLET TWICE DAILY   TRUE METRIX BLOOD GLUCOSE TEST test strip 1 each by Other route 4 (four) times daily as needed.   TRUEplus Lancets 33G MISC Test BS up to four times daily as needed Dx E11.9   valsartan (DIOVAN) 320 MG tablet Take 1 tablet (320 mg total) by mouth daily.   vitamin B-12 (CYANOCOBALAMIN) 1000 MCG tablet Take 1,000 mcg by mouth daily.   [DISCONTINUED] atorvastatin (LIPITOR) 10 MG tablet 1 tablet every day     Allergies:   Hctz [hydrochlorothiazide] and Lisinopril   Social History   Socioeconomic History   Marital status: Widowed    Spouse name: Not on file   Number of children: 3   Years of education: Not on file   Highest education level: Not on file  Occupational History   Occupation: retired  Tobacco Use   Smoking status: Former    Years: 9.00    Types: Cigarettes    Quit date: 11/27/2006    Years since quitting: 14.8   Smokeless tobacco: Never  Vaping Use   Vaping Use: Never used  Substance and Sexual Activity   Alcohol use: No    Alcohol/week: 0.0 standard drinks of alcohol   Drug use: No   Sexual activity: Not Currently  Other Topics Concern   Not on file  Social History Narrative   Three children.  All live nearby.   Social Determinants of Health   Financial Resource Strain: Low Risk  (11/13/2020)   Overall Financial Resource Strain (CARDIA)    Difficulty of Paying Living Expenses: Not hard at all  Food Insecurity: No Food Insecurity (11/13/2020)   Hunger Vital Sign    Worried About Running Out of Food in the Last Year: Never true    Ran Out of Food in the Last Year: Never true  Transportation Needs: No Transportation Needs (11/13/2020)    PRAPARE - Administrator, Civil Service (Medical): No    Lack of Transportation (Non-Medical): No  Physical Activity: Inactive (11/13/2020)   Exercise Vital Sign    Days of Exercise per Week: 0 days    Minutes of Exercise per Session: 0 min  Stress: No Stress Concern Present (11/13/2020)   [employer] of Occupational Health - Occupational Stress Questionnaire    Feeling of Stress : Not at all  Social Connections: Moderately Integrated (11/13/2020)   Social Connection and Isolation Panel [NHANES]    Frequency of Communication with Friends and Family: More than three times a week    Frequency of Social Gatherings with Friends and Family: More than three times a week    Attends Religious Services: More than 4 times per  year    Active Member of Clubs or Organizations: Yes    Attends Archivist Meetings: More than 4 times per year    Marital Status: Widowed     Family History: The patient's family history includes Diabetes in her son; Heart attack in her father; Hypertension in her brother, brother, father, mother, sister, and son. There is no history of Colon cancer.  ROS:   Please see the history of present illness.    (+) Occasional shortness of breath  All other systems reviewed and are negative.  EKGs/Labs/Other Studies Reviewed:    Bilateral Renal Artery Dopplers 02/24/2021: Summary:  Largest Aortic Diameter: 2.0 cm     Renal:     Right: Normal size right kidney. Abnormal right Resistive Index.         Normal cortical thickness of right kidney. No evidence of         right renal artery stenosis. RRV flow present.  Left:  Normal size of left kidney. Abnormal left Resisitve Index.         Normal cortical thickness of the left kidney. No evidence of         left renal artery stenosis. LRV flow present.  Mesenteric:  Normal Celiac artery and Superior Mesenteric artery findings.   CTA Chest/Aorta 06/16/2018: COMPARISON:  PA and lateral chest  06/12/2018. Single-view of the chest 06/14/2018 and 06/13/2018.  IMPRESSION: Negative for pulmonary embolus.   Small bilateral pleural effusions, greater on the left with associated mild compressive atelectasis.   Cardiomegaly.   Fatty infiltration of the liver.   Partial visualization of changes in the upper abdomen consistent with the patient's known pancreatitis.   Aortic Atherosclerosis (ICD10-I70.0) and Emphysema (ICD10-J43.9). Calcific coronary artery disease also noted.  Echo 06/13/2018:  1. The left ventricle has normal systolic function, with an ejection  fraction of 55-60%. The cavity size was normal. There is mildly increased  left ventricular wall thickness. Left ventricular diastolic Doppler  parameters are consistent with impaired relaxation.   2. The right ventricle has normal systolic function. The cavity was  normal. There is no increase in right ventricular wall thickness. Right  ventricular systolic pressure could not be accurately assessed.   3. Left atrial size was mild-moderately dilated.   4. The aortic valve is tricuspid. Mild calcification of the aortic valve.  Mild aortic annular calcification noted. Valve appears moderately scleroic  but not stenotic.   5. The mitral valve is grossly normal. There is mild mitral annular  calcification present.   6. The tricuspid valve is grossly normal.   7. The aortic root is normal in size and structure.   EKG: EKG is personally reviewed. 10/14/21: EKG was not ordered. 03/25/2021: EKG was not ordered. 02/11/2021: Sinus rhythm. Rate 78 bpm.  Recent Labs: 03/03/2021: TSH 2.310 09/01/2021: ALT 9; Hemoglobin 12.4; Platelets 197 09/02/2021: BUN 26; Creatinine, Ser 1.32; Potassium 5.7; Sodium 140   Recent Lipid Panel    Component Value Date/Time   CHOL 153 09/01/2021 0938   TRIG 205 (H) 09/01/2021 0938   HDL 33 (L) 09/01/2021 0938   CHOLHDL 4.6 (H) 09/01/2021 0938   CHOLHDL 4.6 06/12/2018 0815   VLDL 27 06/12/2018  0815   LDLCALC 85 09/01/2021 0938    Physical Exam:    BP 122/60   Pulse 65   Ht 5\' 4"  (1.626 m)   Wt 154 lb (69.9 kg)   BMI 26.43 kg/m  RESP: Respirations unlabored NEURO:  Speech fluent.  PSYCH:  Cognitively intact, oriented to person place and time  ASSESSMENT/PLAN:    Chronic diastolic CHF (congestive heart failure) (HCC) She has been well.  Exercising regularly and is euvolemic.  BP well-controlled.  Continue amlodipine, bisoprolol, clonidine, hydralazine, spironolactone and valsartan.  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  Continue current regimen as above.  Mixed hyperlipidemia Lipids are uncontrolled.  Increase atorvastatin to 20mg .  Repeat fasting lipids/CMP in 2-3 months.    Time spent: 15 minutes-Greater than 50% of this time was spent in counseling, explanation of diagnosis, planning of further management, and coordination of care.   Screening for Secondary Hypertension:     02/11/2021    4:28 PM  Causes  Drugs/Herbals Screened  Renovascular HTN Screened     - Comments Check renal artery Dopplers  Sleep Apnea Screened     - Comments No symptoms  Thyroid Disease Screened  Hyperaldosteronism Not Screened     - Comments She is already on spironolactone 50 mg with no improvement in her blood pressure  Pheochromocytoma N/A     - Comments No symptoms  Cushing's Syndrome N/A     - Comments No symptoms  Hyperparathyroidism Screened  Coarctation of the Aorta Screened     - Comments Blood pressure symmetric  Compliance Screened    Relevant Labs/Studies:    Latest Ref Rng & Units 09/02/2021   10:29 AM 09/01/2021    9:38 AM 03/03/2021    9:57 AM  Basic Labs  Sodium 134 - 144 mmol/L 140  139  143   Potassium 3.5 - 5.2 mmol/L 5.7  5.9  4.8   Creatinine 0.57 - 1.00 mg/dL 03/05/2021  4.85  4.62        Latest Ref Rng & Units 03/03/2021   10:01 AM 11/02/2018    4:11 PM  Thyroid   TSH 0.450 - 4.500 uIU/mL 2.310  1.820                  02/24/2021   10:42 AM  Renovascular   Renal Artery 02/26/2021 Completed Yes    Disposition:    FU with Diandre Merica C. [place], MD, [facility] in 1 year.   Medication Adjustments/Labs and Tests Ordered: Current medicines are reviewed at length with the patient today.  Concerns regarding medicines are outlined above.   Orders Placed This Encounter  Procedures   Lipid panel   Comprehensive metabolic panel   Meds ordered this encounter  Medications   atorvastatin (LIPITOR) 20 MG tablet    Sig: 1 tablet every day    Dispense:  90 tablet    Refill:  3    I,Breanna Adamick,acting as a scribe for [facility], MD.,have documented all relevant documentation on the behalf of Chilton Si, MD,as directed by  Chilton Si, MD while in the presence of Chilton Si, MD.   I, Leronda Lewers C. Chilton Si, MD have reviewed all documentation for this visit.  The documentation of the exam, diagnosis, procedures, and orders on 10/14/2021 are all accurate and complete.

## 2021-10-14 NOTE — Assessment & Plan Note (Signed)
She has been well.  Exercising regularly and is euvolemic.  BP well-controlled.  Continue amlodipine, bisoprolol, clonidine, hydralazine, spironolactone and valsartan.

## 2021-10-14 NOTE — Patient Instructions (Addendum)
Medication Instructions:  Your physician has recommended you make the following change in your medication:   Change: Atorvastatin 20mg  daily  *If you need a refill on your cardiac medications before your next appointment, please call your pharmacy*   Lab Work: Your physician recommends that you return for lab work in January with PCP fasting Lipid panel and Cmp- these are attached with this letter  If you have labs (blood work) drawn today and your tests are completely normal, you will receive your results only by: [place]ift (if you have MyChart) OR A paper copy in the mail If you have any lab test that is abnormal or we need to change your treatment, we will call you to review the results.  Follow-Up: At [facility], you and your health needs are our priority.  As part of our continuing mission to provide you with exceptional heart care, we have created designated Provider Care Teams.  These Care Teams include your primary Cardiologist (physician) and Advanced Practice Providers (APPs -  Physician Assistants and Nurse Practitioners) who all work together to provide you with the care you need, when you need it.  We recommend signing up for the patient portal called "MyChart".  Sign up information is provided on this After Visit Summary.  MyChart is used to connect with patients for Virtual Visits (Telemedicine).  Patients are able to view lab/test results, encounter notes, upcoming appointments, etc.  Non-urgent messages can be sent to your provider as well.   To learn more about what you can do with MyChart, go to [email or web address].    Your next appointment:   1 year with Dr. Oval Linsey- we will send you a letter when it is time to schedule

## 2021-10-29 LAB — HM DIABETES EYE EXAM

## 2021-11-17 DIAGNOSIS — M9905 Segmental and somatic dysfunction of pelvic region: Secondary | ICD-10-CM | POA: Diagnosis not present

## 2021-11-17 DIAGNOSIS — M9903 Segmental and somatic dysfunction of lumbar region: Secondary | ICD-10-CM | POA: Diagnosis not present

## 2021-11-17 DIAGNOSIS — M5432 Sciatica, left side: Secondary | ICD-10-CM | POA: Diagnosis not present

## 2021-11-17 DIAGNOSIS — M9904 Segmental and somatic dysfunction of sacral region: Secondary | ICD-10-CM | POA: Diagnosis not present

## 2021-11-18 DIAGNOSIS — M9905 Segmental and somatic dysfunction of pelvic region: Secondary | ICD-10-CM | POA: Diagnosis not present

## 2021-11-18 DIAGNOSIS — M9903 Segmental and somatic dysfunction of lumbar region: Secondary | ICD-10-CM | POA: Diagnosis not present

## 2021-11-18 DIAGNOSIS — M5432 Sciatica, left side: Secondary | ICD-10-CM | POA: Diagnosis not present

## 2021-11-18 DIAGNOSIS — M9904 Segmental and somatic dysfunction of sacral region: Secondary | ICD-10-CM | POA: Diagnosis not present

## 2021-11-19 DIAGNOSIS — M5432 Sciatica, left side: Secondary | ICD-10-CM | POA: Diagnosis not present

## 2021-11-19 DIAGNOSIS — M9905 Segmental and somatic dysfunction of pelvic region: Secondary | ICD-10-CM | POA: Diagnosis not present

## 2021-11-19 DIAGNOSIS — M9904 Segmental and somatic dysfunction of sacral region: Secondary | ICD-10-CM | POA: Diagnosis not present

## 2021-11-19 DIAGNOSIS — M9903 Segmental and somatic dysfunction of lumbar region: Secondary | ICD-10-CM | POA: Diagnosis not present

## 2021-11-23 DIAGNOSIS — M5432 Sciatica, left side: Secondary | ICD-10-CM | POA: Diagnosis not present

## 2021-11-23 DIAGNOSIS — M9903 Segmental and somatic dysfunction of lumbar region: Secondary | ICD-10-CM | POA: Diagnosis not present

## 2021-11-23 DIAGNOSIS — M9905 Segmental and somatic dysfunction of pelvic region: Secondary | ICD-10-CM | POA: Diagnosis not present

## 2021-11-23 DIAGNOSIS — M9904 Segmental and somatic dysfunction of sacral region: Secondary | ICD-10-CM | POA: Diagnosis not present

## 2021-11-24 DIAGNOSIS — M9905 Segmental and somatic dysfunction of pelvic region: Secondary | ICD-10-CM | POA: Diagnosis not present

## 2021-11-24 DIAGNOSIS — M5432 Sciatica, left side: Secondary | ICD-10-CM | POA: Diagnosis not present

## 2021-11-24 DIAGNOSIS — M9904 Segmental and somatic dysfunction of sacral region: Secondary | ICD-10-CM | POA: Diagnosis not present

## 2021-11-24 DIAGNOSIS — M9903 Segmental and somatic dysfunction of lumbar region: Secondary | ICD-10-CM | POA: Diagnosis not present

## 2021-11-25 DIAGNOSIS — M9903 Segmental and somatic dysfunction of lumbar region: Secondary | ICD-10-CM | POA: Diagnosis not present

## 2021-11-25 DIAGNOSIS — M5432 Sciatica, left side: Secondary | ICD-10-CM | POA: Diagnosis not present

## 2021-11-25 DIAGNOSIS — M9904 Segmental and somatic dysfunction of sacral region: Secondary | ICD-10-CM | POA: Diagnosis not present

## 2021-11-25 DIAGNOSIS — M9905 Segmental and somatic dysfunction of pelvic region: Secondary | ICD-10-CM | POA: Diagnosis not present

## 2021-11-30 DIAGNOSIS — M9903 Segmental and somatic dysfunction of lumbar region: Secondary | ICD-10-CM | POA: Diagnosis not present

## 2021-11-30 DIAGNOSIS — M9905 Segmental and somatic dysfunction of pelvic region: Secondary | ICD-10-CM | POA: Diagnosis not present

## 2021-11-30 DIAGNOSIS — M5432 Sciatica, left side: Secondary | ICD-10-CM | POA: Diagnosis not present

## 2021-11-30 DIAGNOSIS — M9904 Segmental and somatic dysfunction of sacral region: Secondary | ICD-10-CM | POA: Diagnosis not present

## 2021-12-01 DIAGNOSIS — M9905 Segmental and somatic dysfunction of pelvic region: Secondary | ICD-10-CM | POA: Diagnosis not present

## 2021-12-01 DIAGNOSIS — M9903 Segmental and somatic dysfunction of lumbar region: Secondary | ICD-10-CM | POA: Diagnosis not present

## 2021-12-01 DIAGNOSIS — M5432 Sciatica, left side: Secondary | ICD-10-CM | POA: Diagnosis not present

## 2021-12-01 DIAGNOSIS — M9904 Segmental and somatic dysfunction of sacral region: Secondary | ICD-10-CM | POA: Diagnosis not present

## 2021-12-02 ENCOUNTER — Ambulatory Visit (INDEPENDENT_AMBULATORY_CARE_PROVIDER_SITE_OTHER): Payer: Medicare HMO

## 2021-12-02 VITALS — Ht 65.0 in | Wt 156.0 lb

## 2021-12-02 DIAGNOSIS — Z0000 Encounter for general adult medical examination without abnormal findings: Secondary | ICD-10-CM

## 2021-12-02 NOTE — Patient Instructions (Signed)
Heather Oconnor , Thank you for taking time to come for your Medicare Wellness Visit. I appreciate your ongoing commitment to your health goals. Please review the following plan we discussed and let me know if I can assist you in the future.   These are the goals we discussed:  Goals      Activity and Exercise Increased     Evidence-based guidance:  Review current exercise levels.  Assess patient perspective on exercise or activity level, barriers to increasing activity, motivation and readiness for change.  Recommend or set healthy exercise goal based on individual tolerance.  Encourage small steps toward making change in amount of exercise or activity.  Urge reduction of sedentary activities or screen time.  Promote group activities within the community or with family or support person.  Consider referral to rehabiliation therapist for assessment and exercise/activity plan.   Notes:      Exercise 3x per week (30 min per time)        This is a list of the screening recommended for you and due dates:  Health Maintenance  Topic Date Due   Zoster (Shingles) Vaccine (1 of 2) 12/02/2021*   Flu Shot  04/04/2022*   Pneumonia Vaccine (1 - PCV) 09/02/2022*   DEXA scan (bone density measurement)  09/02/2022*   Yearly kidney health urinalysis for diabetes  03/03/2022   Hemoglobin A1C  03/04/2022   Complete foot exam   09/02/2022   Yearly kidney function blood test for diabetes  09/03/2022   Eye exam for diabetics  10/30/2022   Medicare Annual Wellness Visit  12/03/2022   HPV Vaccine  Aged Out   COVID-19 Vaccine  Discontinued  *Topic was postponed. The date shown is not the original due date.    Advanced directives: Advance directive discussed with you today. I have provided a copy for you to complete at home and have notarized. Once this is complete please bring a copy in to our office so we can scan it into your chart.   Conditions/risks identified: Aim for 30 minutes of exercise or brisk  walking, 6-8 glasses of water, and 5 servings of fruits and vegetables each day.   Next appointment: Follow up in one year for your annual wellness visit    Preventive Care 65 Years and Older, Female Preventive care refers to lifestyle choices and visits with your health care provider that can promote health and wellness. What does preventive care include? A yearly physical exam. This is also called an annual well check. Dental exams once or twice a year. Routine eye exams. Ask your health care provider how often you should have your eyes checked. Personal lifestyle choices, including: Daily care of your teeth and gums. Regular physical activity. Eating a healthy diet. Avoiding tobacco and drug use. Limiting alcohol use. Practicing safe sex. Taking low-dose aspirin every day. Taking vitamin and mineral supplements as recommended by your health care provider. What happens during an annual well check? The services and screenings done by your health care provider during your annual well check will depend on your age, overall health, lifestyle risk factors, and family history of disease. Counseling  Your health care provider may ask you questions about your: Alcohol use. Tobacco use. Drug use. Emotional well-being. Home and relationship well-being. Sexual activity. Eating habits. History of falls. Memory and ability to understand (cognition). Work and work Statistician. Reproductive health. Screening  You may have the following tests or measurements: Height, weight, and BMI. Blood pressure. Lipid and cholesterol  levels. These may be checked every 5 years, or more frequently if you are over 56 years old. Skin check. Lung cancer screening. You may have this screening every year starting at age 21 if you have a 30-pack-year history of smoking and currently smoke or have quit within the past 15 years. Fecal occult blood test (FOBT) of the stool. You may have this test every year  starting at age 2. Flexible sigmoidoscopy or colonoscopy. You may have a sigmoidoscopy every 5 years or a colonoscopy every 10 years starting at age 63. Hepatitis C blood test. Hepatitis B blood test. Sexually transmitted disease (STD) testing. Diabetes screening. This is done by checking your blood sugar (glucose) after you have not eaten for a while (fasting). You may have this done every 1-3 years. Bone density scan. This is done to screen for osteoporosis. You may have this done starting at age 84. Mammogram. This may be done every 1-2 years. Talk to your health care provider about how often you should have regular mammograms. Talk with your health care provider about your test results, treatment options, and if necessary, the need for more tests. Vaccines  Your health care provider may recommend certain vaccines, such as: Influenza vaccine. This is recommended every year. Tetanus, diphtheria, and acellular pertussis (Tdap, Td) vaccine. You may need a Td booster every 10 years. Zoster vaccine. You may need this after age 34. Pneumococcal 13-valent conjugate (PCV13) vaccine. One dose is recommended after age 21. Pneumococcal polysaccharide (PPSV23) vaccine. One dose is recommended after age 18. Talk to your health care provider about which screenings and vaccines you need and how often you need them. This information is not intended to replace advice given to you by your health care provider. Make sure you discuss any questions you have with your health care provider. Document Released: 01/17/2015 Document Revised: 09/10/2015 Document Reviewed: 10/22/2014 Elsevier Interactive Patient Education  2017 [employer] Prevention in the Home Falls can cause injuries. They can happen to people of all ages. There are many things you can do to make your home safe and to help prevent falls. What can I do on the outside of my home? Regularly fix the edges of walkways and driveways and fix any  cracks. Remove anything that might make you trip as you walk through a door, such as a raised step or threshold. Trim any bushes or trees on the path to your home. Use bright outdoor lighting. Clear any walking paths of anything that might make someone trip, such as rocks or tools. Regularly check to see if handrails are loose or broken. Make sure that both sides of any steps have handrails. Any raised decks and porches should have guardrails on the edges. Have any leaves, snow, or ice cleared regularly. Use sand or salt on walking paths during winter. Clean up any spills in your garage right away. This includes oil or grease spills. What can I do in the bathroom? Use night lights. Install grab bars by the toilet and in the tub and shower. Do not use towel bars as grab bars. Use non-skid mats or decals in the tub or shower. If you need to sit down in the shower, use a plastic, non-slip stool. Keep the floor dry. Clean up any water that spills on the floor as soon as it happens. Remove soap buildup in the tub or shower regularly. Attach bath mats securely with double-sided non-slip rug tape. Do not have throw rugs and other things  on the floor that can make you trip. What can I do in the bedroom? Use night lights. Make sure that you have a light by your bed that is easy to reach. Do not use any sheets or blankets that are too big for your bed. They should not hang down onto the floor. Have a firm chair that has side arms. You can use this for support while you get dressed. Do not have throw rugs and other things on the floor that can make you trip. What can I do in the kitchen? Clean up any spills right away. Avoid walking on wet floors. Keep items that you use a lot in easy-to-reach places. If you need to reach something above you, use a strong step stool that has a grab bar. Keep electrical cords out of the way. Do not use floor polish or wax that makes floors slippery. If you must  use wax, use non-skid floor wax. Do not have throw rugs and other things on the floor that can make you trip. What can I do with my stairs? Do not leave any items on the stairs. Make sure that there are handrails on both sides of the stairs and use them. Fix handrails that are broken or loose. Make sure that handrails are as long as the stairways. Check any carpeting to make sure that it is firmly attached to the stairs. Fix any carpet that is loose or worn. Avoid having throw rugs at the top or bottom of the stairs. If you do have throw rugs, attach them to the floor with carpet tape. Make sure that you have a light switch at the top of the stairs and the bottom of the stairs. If you do not have them, ask someone to add them for you. What else can I do to help prevent falls? Wear shoes that: Do not have high heels. Have rubber bottoms. Are comfortable and fit you well. Are closed at the toe. Do not wear sandals. If you use a stepladder: Make sure that it is fully opened. Do not climb a closed stepladder. Make sure that both sides of the stepladder are locked into place. Ask someone to hold it for you, if possible. Clearly mark and make sure that you can see: Any grab bars or handrails. First and last steps. Where the edge of each step is. Use tools that help you move around (mobility aids) if they are needed. These include: Canes. Walkers. Scooters. Crutches. Turn on the lights when you go into a dark area. Replace any light bulbs as soon as they burn out. Set up your furniture so you have a clear path. Avoid moving your furniture around. If any of your floors are uneven, fix them. If there are any pets around you, be aware of where they are. Review your medicines with your doctor. Some medicines can make you feel dizzy. This can increase your chance of falling. Ask your doctor what other things that you can do to help prevent falls. This information is not intended to replace  advice given to you by your health care provider. Make sure you discuss any questions you have with your health care provider. Document Released: 10/17/2008 Document Revised: 05/29/2015 Document Reviewed: 01/25/2014 Elsevier Interactive Patient Education  2017 [employer].

## 2021-12-02 NOTE — Progress Notes (Signed)
Subjective:   Heather Oconnor is a 81 y.o. female who presents for Medicare Annual (Subsequent) preventive examination. I connected with  Heather Oconnor on 12/02/21 by a audio enabled telemedicine application and verified that I am speaking with the correct person using two identifiers.  Patient Location: Home  Provider Location: Home Office  I discussed the limitations of evaluation and management by telemedicine. The patient expressed understanding and agreed to proceed.  Review of Systems     Cardiac Risk Factors include: advanced age (>8men, >65 women);hypertension;diabetes mellitus     Objective:    Today's Vitals   12/02/21 1039  Weight: 156 lb (70.8 kg)  Height: 5\' 5"  (1.651 m)   Body mass index is 25.96 kg/m.     12/02/2021   10:43 AM 11/13/2020   11:15 AM 07/11/2018    9:09 AM 07/04/2018    6:44 PM 07/04/2018    3:22 PM 06/12/2018    2:38 PM 06/12/2018    7:36 AM  Advanced Directives  Does Patient Have a Medical Advance Directive? No Yes No No No No No  Type of Social research officer, government;Living will       Copy of [place] in Chart?  No - copy requested       Would patient like information on creating a medical advance directive? No - Patient declined  No - Patient declined   No - Patient declined No - Patient declined    Current Medications (verified) Outpatient Encounter Medications as of 12/02/2021  Medication Sig   acetaminophen (TYLENOL) 500 MG tablet Take 2 tablets (1,000 mg total) by mouth every 6 (six) hours as needed.   amLODipine (NORVASC) 10 MG tablet Take 1 tablet (10 mg total) by mouth daily.   atorvastatin (LIPITOR) 20 MG tablet 1 tablet every day   bisoprolol (ZEBETA) 5 MG tablet TAKE 1 AND 1/2 TABLETS EVERY DAY   ciprofloxacin (CILOXAN) 0.3 % ophthalmic solution Administer 1 drop into the left eye every two (2) hours. X 2 days them 1 drop  3 times a day x 5 days.   cloNIDine (CATAPRES) 0.2 MG tablet  Take 1 tablet (0.2 mg total) by mouth 2 (two) times daily.   clopidogrel (PLAVIX) 75 MG tablet Take 1 tablet (75 mg total) by mouth daily.   dorzolamide-timolol (COSOPT) 22.3-6.8 MG/ML ophthalmic solution Place 1 drop into both eyes 2 (two) times daily.   glipiZIDE (GLUCOTROL XL) 5 MG 24 hr tablet Take 1 tablet (5 mg total) by mouth daily.   hydrALAZINE (APRESOLINE) 100 MG tablet Take 1 tablet (100 mg total) by mouth 3 (three) times daily.   metFORMIN (GLUCOPHAGE) 500 MG tablet TAKE 2 TABLETS TWICE DAILY WITH MEALS   mirtazapine (REMERON) 15 MG tablet Take 1 tablet (15 mg total) by mouth at bedtime.   pantoprazole (PROTONIX) 40 MG tablet TAKE 1 TABLET EVERY DAY   spironolactone (ALDACTONE) 25 MG tablet TAKE 1 TABLET TWICE DAILY   TRUE METRIX BLOOD GLUCOSE TEST test strip 1 each by Other route 4 (four) times daily as needed.   TRUEplus Lancets 33G MISC Test BS up to four times daily as needed Dx E11.9   valsartan (DIOVAN) 320 MG tablet Take 1 tablet (320 mg total) by mouth daily.   vitamin B-12 (CYANOCOBALAMIN) 1000 MCG tablet Take 1,000 mcg by mouth daily.   No facility-administered encounter medications on file as of 12/02/2021.    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   Qualifier: Diagnosis of  By: Zeb Comfort     Pancreatitis    Transient cerebral ischemia 05/08/2008   Qualifier: Diagnosis of  By: Zeb Comfort     Past Surgical History:  Procedure Laterality Date   COLONOSCOPY  09/07/2007   RMR: anal canal/external hemorrhoids, redundant colon, left-sided diverticula, otherwise normal colonic mucosa   Family History  Problem Relation Age of Onset    Hypertension Mother    Heart attack Father    Hypertension Father    Hypertension Sister    Hypertension Brother    Hypertension Brother    Diabetes Son    Hypertension Son    Colon cancer Neg Hx    Social History   Socioeconomic History   Marital status: Widowed    Spouse name: Not on file   Number of children: 3   Years of education: Not on file   Highest education level: Not on file  Occupational History   Occupation: retired  Tobacco Use   Smoking status: Former    Years: 9.00    Types: Cigarettes    Quit date: 11/27/2006    Years since quitting: 15.0   Smokeless tobacco: Never  Vaping Use   Vaping Use: Never used  Substance and Sexual Activity   Alcohol use: No    Alcohol/week: 0.0 standard drinks of alcohol   Drug use: No   Sexual activity: Not Currently  Other Topics Concern   Not on file  Social History Narrative   Three children.  All live nearby.   Social Determinants of Health   Financial Resource Strain: Low Risk  (12/02/2021)   Overall Financial Resource Strain (CARDIA)    Difficulty of Paying Living Expenses: Not hard at all  Food Insecurity: No Food Insecurity (12/02/2021)   Hunger Vital Sign    Worried About Running Out of Food in the Last Year: Never true    Ran Out of Food in the Last Year: Never true  Transportation Needs: No Transportation Needs (11/13/2020)   PRAPARE - Hydrologist (Medical): No    Lack of Transportation (Non-Medical): No  Physical Activity: Insufficiently Active (12/02/2021)   Exercise Vital Sign    Days of Exercise per Week: 3 days    Minutes of Exercise per Session: 30 min  Stress: No Stress Concern Present (12/02/2021)   [place]    Feeling of Stress : Not at all  Social Connections: Moderately Isolated (12/02/2021)   Social Connection and Isolation Panel [NHANES]    Frequency of Communication with Friends and Family:  More than three times a week    Frequency of Social Gatherings with Friends and Family: More than three times a week    Attends Religious Services: More than 4 times per year    Active Member of [employer] or Organizations: No    Attends Archivist Meetings: Never    Marital Status: Widowed    Tobacco Counseling Counseling given: Not Answered   Clinical Intake:  Pre-visit preparation completed: Yes  Pain : No/denies pain     Nutritional Risks:  None Diabetes: Yes CBG done?: No Did pt. bring in CBG monitor from home?: No  How often do you need to have someone help you when you read instructions, pamphlets, or other written materials from your doctor or pharmacy?: 1 - Never  Diabetic?yes  Interpreter Needed?: NoNutrition Risk Assessment:  Has the patient had any N/V/D within the last 2 months?  No  Does the patient have any non-healing wounds?  No  Has the patient had any unintentional weight loss or weight gain?  No   Diabetes:  Is the patient diabetic?  Yes  If diabetic, was a CBG obtained today?  No  Did the patient bring in their glucometer from home?  No  How often do you monitor your CBG's? Daily .   Financial Strains and Diabetes Management:  Are you having any financial strains with the device, your supplies or your medication? No .  Does the patient want to be seen by Chronic Care Management for management of their diabetes?  No  Would the patient like to be referred to a Nutritionist or for Diabetic Management?  No   Diabetic Exams:  Diabetic Eye Exam: Completed 09/2021 Diabetic Foot Exam: Overdue, Pt has been advised about the importance in completing this exam. Pt is scheduled for diabetic foot exam on next office visit .   Information entered by :: Jadene Pierini, LPN   Activities of Daily Living    12/02/2021   10:44 AM  In your present state of health, do you have any difficulty performing the following activities:  Hearing? 0  Vision? 0   Difficulty concentrating or making decisions? 0  Walking or climbing stairs? 0  Dressing or bathing? 0  Doing errands, shopping? 0  Preparing Food and eating ? N  Using the Toilet? N  In the past six months, have you accidently leaked urine? N  Do you have problems with loss of bowel control? N  Managing your Medications? N  Managing your Finances? N  Housekeeping or managing your Housekeeping? N    Patient Care Team: Loman Brooklyn, FNP as PCP - General (Family Medicine) Minus Breeding, MD as PCP - Cardiology (Cardiology) Gala Romney Cristopher Estimable, MD as Consulting Physician (Gastroenterology) Bond, Tracie Harrier, MD as Consulting Physician (Ophthalmology)  Indicate any recent Medical Services you may have received from other than [facility] providers in the past year (date may be approximate).     Assessment:   This is a routine wellness examination for [place].  Hearing/Vision screen Vision Screening - Comments:: Wears rx glasses - up to date with routine eye exams with  [facility]   Dietary issues and exercise activities discussed: Current Exercise Habits: Home exercise routine, Type of exercise: walking, Time (Minutes): 30, Frequency (Times/Week): 3, Weekly Exercise (Minutes/Week): 90, Intensity: Mild, Exercise limited by: orthopedic condition(s)   Goals Addressed             This Visit's Progress    Exercise 3x per week (30 min per time)         Depression Screen    09/01/2021    9:50 AM 04/27/2021   12:08 PM 03/03/2021    9:42 AM 12/03/2020   10:06 AM 11/13/2020   10:46 AM 10/23/2020    9:16 AM 07/23/2020    9:42 AM  PHQ 2/9 Scores  PHQ - 2 Score 0 0 0 0 1 0 0  PHQ- 9 Score 1  1 1 1  0 3    Fall Risk  12/02/2021   10:40 AM 09/01/2021    9:50 AM 03/03/2021    9:42 AM 11/13/2020   11:13 AM 07/23/2020    9:42 AM  Fall Risk   Falls in the past year? 0 0 0 0 0  Number falls in past yr: 0   0   Injury with Fall? 0   0   Risk for fall due to : No Fall Risks    Mental status change   Follow up Falls prevention discussed   Education provided;Falls prevention discussed     FALL RISK PREVENTION PERTAINING TO THE HOME:  Any stairs in or around the home? No  If so, are there any without handrails? No  Home free of loose throw rugs in walkways, pet beds, electrical cords, etc? Yes  Adequate lighting in your home to reduce risk of falls? Yes   ASSISTIVE DEVICES UTILIZED TO PREVENT FALLS:  Life alert? No  Use of a cane, walker or w/c? No  Grab bars in the bathroom? Yes  Shower chair or bench in shower? Yes  Elevated toilet seat or a handicapped toilet? Yes          12/02/2021   10:44 AM 11/13/2020   10:48 AM  6CIT Screen  What Year? 0 points 0 points  What month? 0 points 0 points  What time? 0 points 0 points  Count back from 20 0 points 0 points  Months in reverse 0 points 4 points  Repeat phrase 0 points 6 points  Total Score 0 points 10 points    Immunizations  There is no immunization history on file for this patient.  TDAP status: Due, Education has been provided regarding the importance of this vaccine. Advised may receive this vaccine at local pharmacy or Health Dept. Aware to provide a copy of the vaccination record if obtained from local pharmacy or Health Dept. Verbalized acceptance and understanding.  Flu Vaccine status: Due, Education has been provided regarding the importance of this vaccine. Advised may receive this vaccine at local pharmacy or Health Dept. Aware to provide a copy of the vaccination record if obtained from local pharmacy or Health Dept. Verbalized acceptance and understanding.  Pneumococcal vaccine status: Due, Education has been provided regarding the importance of this vaccine. Advised may receive this vaccine at local pharmacy or Health Dept. Aware to provide a copy of the vaccination record if obtained from local pharmacy or Health Dept. Verbalized acceptance and understanding.  Covid-19 vaccine  status: Declined, Education has been provided regarding the importance of this vaccine but patient still declined. Advised may receive this vaccine at local pharmacy or Health Dept.or vaccine clinic. Aware to provide a copy of the vaccination record if obtained from local pharmacy or Health Dept. Verbalized acceptance and understanding.  Qualifies for Shingles Vaccine? Yes   Zostavax completed No   Shingrix Completed?: No.    Education has been provided regarding the importance of this vaccine. Patient has been advised to call insurance company to determine out of pocket expense if they have not yet received this vaccine. Advised may also receive vaccine at local pharmacy or Health Dept. Verbalized acceptance and understanding.  Screening Tests Health Maintenance  Topic Date Due   Zoster Vaccines- Shingrix (1 of 2) 12/02/2021 (Originally 09/10/1959)   INFLUENZA VACCINE  04/04/2022 (Originally 08/04/2021)   Pneumonia Vaccine 30+ Years old (1 - PCV) 09/02/2022 (Originally 09/10/1946)   DEXA SCAN  09/02/2022 (Originally 09/09/2005)   Diabetic kidney evaluation - Urine ACR  03/03/2022   HEMOGLOBIN A1C  03/04/2022   FOOT EXAM  09/02/2022   Diabetic kidney evaluation - GFR measurement  09/03/2022   OPHTHALMOLOGY EXAM  10/30/2022   Medicare Annual Wellness (AWV)  12/03/2022   HPV VACCINES  Aged Out   COVID-19 Vaccine  Discontinued    Health Maintenance  There are no preventive care reminders to display for this patient.  Colorectal cancer screening: No longer required.   Mammogram status: No longer required due to age.  Bone Density status: Ordered Declined at this time . Pt provided with contact info and advised to call to schedule appt.  Lung Cancer Screening: (Low Dose CT Chest recommended if Age 73-80 years, 30 pack-year currently smoking OR have quit w/in 15years.) does not qualify.   Lung Cancer Screening Referral: n/a  Additional Screening:  Hepatitis C Screening: does not  qualify;  Vision Screening: Recommended annual ophthalmology exams for early detection of glaucoma and other disorders of the eye. Is the patient up to date with their annual eye exam?  Yes  Who is the provider or what is the name of the office in which the patient attends annual eye exams? [facility]  If pt is not established with a provider, would they like to be referred to a provider to establish care? No .   Dental Screening: Recommended annual dental exams for proper oral hygiene  Community Resource Referral / Chronic Care Management: CRR required this visit?  No   CCM required this visit?  No      Plan:     I have personally reviewed and noted the following in the patient's chart:   Medical and social history Use of alcohol, tobacco or illicit drugs  Current medications and supplements including opioid prescriptions. Patient is not currently taking opioid prescriptions. Functional ability and status Nutritional status Physical activity Advanced directives List of other physicians Hospitalizations, surgeries, and ER visits in previous 12 months Vitals Screenings to include cognitive, depression, and falls Referrals and appointments  In addition, I have reviewed and discussed with patient certain preventive protocols, quality metrics, and best practice recommendations. A written personalized care plan for preventive services as well as general preventive health recommendations were provided to patient.     Daphane Shepherd, LPN   [id number]   Nurse Notes: Declined all vaccines and DEXA scan

## 2021-12-07 DIAGNOSIS — M9905 Segmental and somatic dysfunction of pelvic region: Secondary | ICD-10-CM | POA: Diagnosis not present

## 2021-12-07 DIAGNOSIS — M5432 Sciatica, left side: Secondary | ICD-10-CM | POA: Diagnosis not present

## 2021-12-07 DIAGNOSIS — M9904 Segmental and somatic dysfunction of sacral region: Secondary | ICD-10-CM | POA: Diagnosis not present

## 2021-12-07 DIAGNOSIS — M9903 Segmental and somatic dysfunction of lumbar region: Secondary | ICD-10-CM | POA: Diagnosis not present

## 2021-12-10 DIAGNOSIS — M5432 Sciatica, left side: Secondary | ICD-10-CM | POA: Diagnosis not present

## 2021-12-10 DIAGNOSIS — M9905 Segmental and somatic dysfunction of pelvic region: Secondary | ICD-10-CM | POA: Diagnosis not present

## 2021-12-10 DIAGNOSIS — M9903 Segmental and somatic dysfunction of lumbar region: Secondary | ICD-10-CM | POA: Diagnosis not present

## 2021-12-10 DIAGNOSIS — M9904 Segmental and somatic dysfunction of sacral region: Secondary | ICD-10-CM | POA: Diagnosis not present

## 2021-12-16 DIAGNOSIS — M9905 Segmental and somatic dysfunction of pelvic region: Secondary | ICD-10-CM | POA: Diagnosis not present

## 2021-12-16 DIAGNOSIS — M9904 Segmental and somatic dysfunction of sacral region: Secondary | ICD-10-CM | POA: Diagnosis not present

## 2021-12-16 DIAGNOSIS — M9903 Segmental and somatic dysfunction of lumbar region: Secondary | ICD-10-CM | POA: Diagnosis not present

## 2021-12-16 DIAGNOSIS — M5432 Sciatica, left side: Secondary | ICD-10-CM | POA: Diagnosis not present

## 2021-12-21 ENCOUNTER — Other Ambulatory Visit: Payer: Self-pay | Admitting: Family Medicine

## 2021-12-21 DIAGNOSIS — K2100 Gastro-esophageal reflux disease with esophagitis, without bleeding: Secondary | ICD-10-CM

## 2021-12-21 DIAGNOSIS — I10 Essential (primary) hypertension: Secondary | ICD-10-CM

## 2021-12-22 DIAGNOSIS — M9903 Segmental and somatic dysfunction of lumbar region: Secondary | ICD-10-CM | POA: Diagnosis not present

## 2021-12-22 DIAGNOSIS — M9904 Segmental and somatic dysfunction of sacral region: Secondary | ICD-10-CM | POA: Diagnosis not present

## 2021-12-22 DIAGNOSIS — M9905 Segmental and somatic dysfunction of pelvic region: Secondary | ICD-10-CM | POA: Diagnosis not present

## 2021-12-22 DIAGNOSIS — M5432 Sciatica, left side: Secondary | ICD-10-CM | POA: Diagnosis not present

## 2022-01-06 ENCOUNTER — Encounter: Payer: Self-pay | Admitting: Family Medicine

## 2022-01-06 ENCOUNTER — Ambulatory Visit (INDEPENDENT_AMBULATORY_CARE_PROVIDER_SITE_OTHER): Payer: Medicare HMO | Admitting: Family Medicine

## 2022-01-06 VITALS — BP 135/70 | HR 62 | Temp 97.9°F | Ht 65.0 in | Wt 154.5 lb

## 2022-01-06 DIAGNOSIS — E785 Hyperlipidemia, unspecified: Secondary | ICD-10-CM

## 2022-01-06 DIAGNOSIS — J449 Chronic obstructive pulmonary disease, unspecified: Secondary | ICD-10-CM

## 2022-01-06 DIAGNOSIS — K7460 Unspecified cirrhosis of liver: Secondary | ICD-10-CM | POA: Diagnosis not present

## 2022-01-06 DIAGNOSIS — Z8673 Personal history of transient ischemic attack (TIA), and cerebral infarction without residual deficits: Secondary | ICD-10-CM | POA: Diagnosis not present

## 2022-01-06 DIAGNOSIS — E1159 Type 2 diabetes mellitus with other circulatory complications: Secondary | ICD-10-CM | POA: Diagnosis not present

## 2022-01-06 DIAGNOSIS — F321 Major depressive disorder, single episode, moderate: Secondary | ICD-10-CM | POA: Diagnosis not present

## 2022-01-06 DIAGNOSIS — E1165 Type 2 diabetes mellitus with hyperglycemia: Secondary | ICD-10-CM | POA: Diagnosis not present

## 2022-01-06 DIAGNOSIS — I152 Hypertension secondary to endocrine disorders: Secondary | ICD-10-CM

## 2022-01-06 DIAGNOSIS — E1169 Type 2 diabetes mellitus with other specified complication: Secondary | ICD-10-CM | POA: Diagnosis not present

## 2022-01-06 DIAGNOSIS — I700 Atherosclerosis of aorta: Secondary | ICD-10-CM | POA: Diagnosis not present

## 2022-01-06 DIAGNOSIS — I5032 Chronic diastolic (congestive) heart failure: Secondary | ICD-10-CM | POA: Diagnosis not present

## 2022-01-06 DIAGNOSIS — N1832 Chronic kidney disease, stage 3b: Secondary | ICD-10-CM | POA: Insufficient documentation

## 2022-01-06 DIAGNOSIS — E782 Mixed hyperlipidemia: Secondary | ICD-10-CM | POA: Diagnosis not present

## 2022-01-06 DIAGNOSIS — E538 Deficiency of other specified B group vitamins: Secondary | ICD-10-CM | POA: Diagnosis not present

## 2022-01-06 LAB — BAYER DCA HB A1C WAIVED: HB A1C (BAYER DCA - WAIVED): 6.6 % — ABNORMAL HIGH (ref 4.8–5.6)

## 2022-01-06 NOTE — Progress Notes (Signed)
Established Patient Office Visit  Subjective   Patient ID: Heather Oconnor, female    DOB: 18-Jul-1940  Age: 82 y.o. MRN: [id number]  Chief Complaint  Patient presents with   Medical Management of Chronic Issues   Diabetes    HPI T2DM Pt presents for follow up evaluation of Type 2 diabetes mellitus. Patient denies foot ulcerations, increased appetite, nausea, paresthesia of the feet, polydipsia, polyuria, visual disturbances, vomiting, and weight loss.  Current diabetic medications include metformin, glipizide Compliant with meds - Yes  Current monitoring regimen:  only occasionally  last check was 112 Any episodes of hypoglycemia? no   Eye exam current (within one year): yes Is She on ACE inhibitor or angiotensin II receptor blocker?  Yes, losartan Is She on statin? Yes atorvastatin  2. HTN Complaint with meds - Yes Current Medications - amlodipine, bisoprolol, clonidine, hydralazine, spironolactone, valsartan. Checking BP at home ranging 130s/70s Pertinent ROS:  Headache - No Fatigue - No Visual Disturbances - No Chest pain - No Dyspnea - No Palpitations - No LE edema - No  She follow up with cardiology for CHF, CAD, and malignant HTN.   3. COPD Denies shortness of breath, cough, wheezing.   4. B12 deficiency She is not currently on a supplement. Her last B12 was normal.   5. Depression On remeron. Reports that this is working well for her.       01/06/2022    9:54 AM 09/01/2021    9:50 AM 04/27/2021   12:08 PM  Depression screen PHQ 2/9  Decreased Interest 0 0 0  Down, Depressed, Hopeless 0 0 0  PHQ - 2 Score 0 0 0  Altered sleeping 0 0   Tired, decreased energy 1 1   Change in appetite 0 0   Feeling bad or failure about yourself  0 0   Trouble concentrating 0 0   Moving slowly or fidgety/restless 0 0   Suicidal thoughts 0 0   PHQ-9 Score 1 1   Difficult doing work/chores Not difficult at all Not difficult at all       01/06/2022    9:54 AM  09/01/2021    9:50 AM 12/03/2020   10:06 AM 10/23/2020    9:16 AM  GAD 7 : Generalized Anxiety Score  Nervous, Anxious, on Edge 0 0 0 0  Control/stop worrying 0 0 0 0  Worry too much - different things 0 0 0 0  Trouble relaxing 0 0 0 0  Restless 0 0 0 0  Easily annoyed or irritable 0 0 0 0  Afraid - awful might happen 0 0 0 0  Total GAD 7 Score 0 0 0 0  Anxiety Difficulty Not difficult at all Not difficult at all Not difficult at all Not difficult at all      ROS As per HPI.    Objective:     BP 135/70   Pulse 62   Temp 97.9 F (36.6 C) (Temporal)   Ht _0  (1.651 m)   Wt 154 lb 8 oz (70.1 kg)   SpO2 95%   BMI 25.71 kg/m    Physical Exam Vitals and nursing note reviewed.  Constitutional:      General: She is not in acute distress.    Appearance: She is obese. She is not ill-appearing, toxic-appearing or diaphoretic.  Neck:     Thyroid: No thyroid mass, thyromegaly or thyroid tenderness.  Cardiovascular:     Rate and Rhythm: Normal rate and  regular rhythm.     Heart sounds: Normal heart sounds. No murmur heard. Abdominal:     General: Bowel sounds are normal. There is no distension.     Palpations: Abdomen is soft.     Tenderness: There is no abdominal tenderness. There is no guarding or rebound.  Musculoskeletal:     Cervical back: Neck supple. No rigidity.     Right lower leg: No edema.     Left lower leg: No edema.  Neurological:     General: No focal deficit present.     Mental Status: She is alert and oriented to person, place, and time.  Psychiatric:        Mood and Affect: Mood normal.        Behavior: Behavior normal.      No results found for any visits on 01/06/22.    The ASCVD Risk score (Arnett DK, et al., 2019) failed to calculate for the following reasons:   The 2019 ASCVD risk score is only valid for ages 45 to 59    Assessment & Plan:   Rebbie was seen today for medical management of chronic issues and diabetes.  Diagnoses  and all orders for this visit:  Type 2 diabetes mellitus with hyperglycemia, without long-term current use of insulin (HCC) A1c is 6.6 today, at goal of <7. Continue metformin and glipizide. Eye exam, foot exam, and urine micro are UTD. On statin and ARB.  -     CMP14+EGFR -     Bayer DCA Hb A1c Waived  Hypertension associated with diabetes ([place]) Well controlled on current regimen. Managed by HTN clinic. Continue amlodipine, bisoprolol, clonidine, hydralazine, spironolactone, valsartan.  -     CMP14+EGFR  Hyperlipidemia associated with type 2 diabetes mellitus ([place]) On statin.   Stage 3b chronic kidney disease (CKD) ([place]) Labs pending. On ARB.  -     [id number]  Chronic diastolic CHF (congestive heart failure) ([place]) Atherosclerosis of aorta ([place]) Managed by cardiology.   History of CVA in adulthood On plavix and statin.   Moderate major depression ([place]) Well controlled with remeron.   COPD without exacerbation ([place]) Denies symptoms.   B12 deficiency Not on supplement. Last B12 was normal. Will recheck at next visit.    Return in about 6 months (around 07/07/2022) for CPE.   The patient indicates understanding of these issues and agrees with the plan.  Gwenlyn Perking, FNP

## 2022-01-07 ENCOUNTER — Telehealth (HOSPITAL_BASED_OUTPATIENT_CLINIC_OR_DEPARTMENT_OTHER): Payer: Self-pay | Admitting: *Deleted

## 2022-01-07 DIAGNOSIS — Z5181 Encounter for therapeutic drug level monitoring: Secondary | ICD-10-CM

## 2022-01-07 DIAGNOSIS — E1165 Type 2 diabetes mellitus with hyperglycemia: Secondary | ICD-10-CM

## 2022-01-07 DIAGNOSIS — I700 Atherosclerosis of aorta: Secondary | ICD-10-CM

## 2022-01-07 DIAGNOSIS — E782 Mixed hyperlipidemia: Secondary | ICD-10-CM

## 2022-01-07 DIAGNOSIS — E875 Hyperkalemia: Secondary | ICD-10-CM

## 2022-01-07 DIAGNOSIS — Z8673 Personal history of transient ischemic attack (TIA), and cerebral infarction without residual deficits: Secondary | ICD-10-CM

## 2022-01-07 LAB — COMPREHENSIVE METABOLIC PANEL
ALT: 11 IU/L (ref 0–32)
AST: 15 IU/L (ref 0–40)
Albumin/Globulin Ratio: 1.6 (ref 1.2–2.2)
Albumin: 4.5 g/dL (ref 3.7–4.7)
Alkaline Phosphatase: 64 IU/L (ref 44–121)
BUN/Creatinine Ratio: 20 (ref 12–28)
BUN: 20 mg/dL (ref 8–27)
Bilirubin Total: 0.4 mg/dL (ref 0.0–1.2)
CO2: 24 mmol/L (ref 20–29)
Calcium: 10.4 mg/dL — ABNORMAL HIGH (ref 8.7–10.3)
Chloride: 103 mmol/L (ref 96–106)
Creatinine, Ser: 0.98 mg/dL (ref 0.57–1.00)
Globulin, Total: 2.8 g/dL (ref 1.5–4.5)
Glucose: 91 mg/dL (ref 70–99)
Potassium: 5.5 mmol/L — ABNORMAL HIGH (ref 3.5–5.2)
Sodium: 141 mmol/L (ref 134–144)
Total Protein: 7.3 g/dL (ref 6.0–8.5)
eGFR: 58 mL/min/{1.73_m2} — ABNORMAL LOW (ref >59)

## 2022-01-07 LAB — LIPID PANEL
Chol/HDL Ratio: 4.5 ratio — ABNORMAL HIGH (ref 0.0–4.4)
Cholesterol, Total: 152 mg/dL (ref 100–199)
HDL: 34 mg/dL — ABNORMAL LOW (ref >39)
LDL Chol Calc (NIH): 91 mg/dL (ref 0–99)
Triglycerides: 155 mg/dL — ABNORMAL HIGH (ref 0–149)
VLDL Cholesterol Cal: 27 mg/dL (ref 5–40)

## 2022-01-07 MED ORDER — ATORVASTATIN CALCIUM 40 MG PO TABS: 40.0000 mg | ORAL_TABLET | Freq: Every day | ORAL | 3 refills | Status: DC

## 2022-01-07 NOTE — Telephone Encounter (Signed)
Advised patient of labs, verbalized understanding Also mailed typed out instructions along with lab orders.

## 2022-01-07 NOTE — Telephone Encounter (Signed)
-----   Message from Loel Dubonnet, NP sent at 01/07/2022 11:59 AM EST ----- Normal kidney and liver function.  Potassium mildly elevated.  Reduce spironolactone to 25 mg daily.  BMP in 1 week for monitoring.  Ensure not taking over-the-counter potassium supplements or drinking electrolyte drinks.   Cholesterol panel not at goal.  Please ensure taking atorvastatin 20 mg daily.   If taking regularly, increased dose to 40 mg daily and repeat FLP/LFT in 3 months. If not taking, resume and repeat FLP/LFT in 3 months.

## 2022-01-11 DIAGNOSIS — H401121 Primary open-angle glaucoma, left eye, mild stage: Secondary | ICD-10-CM | POA: Diagnosis not present

## 2022-01-20 ENCOUNTER — Other Ambulatory Visit: Payer: Medicare HMO

## 2022-01-20 DIAGNOSIS — E875 Hyperkalemia: Secondary | ICD-10-CM | POA: Diagnosis not present

## 2022-01-20 LAB — BASIC METABOLIC PANEL
BUN/Creatinine Ratio: 18 (ref 12–28)
BUN: 16 mg/dL (ref 8–27)
CO2: 21 mmol/L (ref 20–29)
Calcium: 9.8 mg/dL (ref 8.7–10.3)
Chloride: 102 mmol/L (ref 96–106)
Creatinine, Ser: 0.9 mg/dL (ref 0.57–1.00)
Glucose: 155 mg/dL — ABNORMAL HIGH (ref 70–99)
Potassium: 4.4 mmol/L (ref 3.5–5.2)
Sodium: 141 mmol/L (ref 134–144)
eGFR: 64 mL/min/{1.73_m2} (ref >59)

## 2022-02-24 ENCOUNTER — Other Ambulatory Visit: Payer: Self-pay | Admitting: Family Medicine

## 2022-03-04 ENCOUNTER — Other Ambulatory Visit: Payer: Self-pay | Admitting: Family Medicine

## 2022-03-04 DIAGNOSIS — K2100 Gastro-esophageal reflux disease with esophagitis, without bleeding: Secondary | ICD-10-CM

## 2022-03-04 DIAGNOSIS — E1165 Type 2 diabetes mellitus with hyperglycemia: Secondary | ICD-10-CM

## 2022-03-04 DIAGNOSIS — I10 Essential (primary) hypertension: Secondary | ICD-10-CM

## 2022-03-04 DIAGNOSIS — I5032 Chronic diastolic (congestive) heart failure: Secondary | ICD-10-CM

## 2022-03-04 DIAGNOSIS — Z8673 Personal history of transient ischemic attack (TIA), and cerebral infarction without residual deficits: Secondary | ICD-10-CM

## 2022-03-07 ENCOUNTER — Other Ambulatory Visit: Payer: Self-pay | Admitting: Family Medicine

## 2022-03-08 NOTE — Telephone Encounter (Signed)
Patient calling back to get update on status of this Rx. Explained to patient that it looks like it was denied because the last time it was sent to the pharmacy on our end was in July 2023 and discontinued August 2023. Patient says she has been taking the medication this whole time and doesn't want to run out. Wants to know if she can still take and if so, can refills be sent in so she doesn't run out?

## 2022-03-08 NOTE — Telephone Encounter (Signed)
Pt wants to know why this rx was denied. Pt said that she got a letter from [facility] say that rx was denied.  Pt seen TM 01/06/2022 and has upcoming apt in 07/05/2022.  Should she continue rx? Please call back

## 2022-03-09 ENCOUNTER — Encounter (HOSPITAL_BASED_OUTPATIENT_CLINIC_OR_DEPARTMENT_OTHER): Payer: Self-pay | Admitting: Cardiovascular Disease

## 2022-03-09 MED ORDER — SPIRONOLACTONE 25 MG PO TABS: 25.0000 mg | ORAL_TABLET | Freq: Every day | ORAL | 3 refills | Status: DC

## 2022-03-14 ENCOUNTER — Other Ambulatory Visit (HOSPITAL_BASED_OUTPATIENT_CLINIC_OR_DEPARTMENT_OTHER): Payer: Self-pay | Admitting: Cardiovascular Disease

## 2022-03-14 ENCOUNTER — Other Ambulatory Visit: Payer: Self-pay | Admitting: Family Medicine

## 2022-03-14 DIAGNOSIS — E1165 Type 2 diabetes mellitus with hyperglycemia: Secondary | ICD-10-CM

## 2022-03-15 NOTE — Telephone Encounter (Signed)
Rx request sent to pharmacy.  

## 2022-03-16 ENCOUNTER — Other Ambulatory Visit: Payer: Medicare HMO

## 2022-03-16 DIAGNOSIS — Z5181 Encounter for therapeutic drug level monitoring: Secondary | ICD-10-CM | POA: Diagnosis not present

## 2022-03-16 DIAGNOSIS — E782 Mixed hyperlipidemia: Secondary | ICD-10-CM | POA: Diagnosis not present

## 2022-03-17 LAB — HEPATIC FUNCTION PANEL
ALT: 14 IU/L (ref 0–32)
AST: 16 IU/L (ref 0–40)
Albumin: 4.6 g/dL (ref 3.7–4.7)
Alkaline Phosphatase: 66 IU/L (ref 44–121)
Bilirubin Total: 0.5 mg/dL (ref 0.0–1.2)
Bilirubin, Direct: 0.13 mg/dL (ref 0.00–0.40)
Total Protein: 7.1 g/dL (ref 6.0–8.5)

## 2022-03-17 LAB — LIPID PANEL
Chol/HDL Ratio: 3.4 ratio (ref 0.0–4.4)
Cholesterol, Total: 130 mg/dL (ref 100–199)
HDL: 38 mg/dL — ABNORMAL LOW (ref >39)
LDL Chol Calc (NIH): 64 mg/dL (ref 0–99)
Triglycerides: 161 mg/dL — ABNORMAL HIGH (ref 0–149)
VLDL Cholesterol Cal: 28 mg/dL (ref 5–40)

## 2022-03-22 DIAGNOSIS — H401121 Primary open-angle glaucoma, left eye, mild stage: Secondary | ICD-10-CM | POA: Diagnosis not present

## 2022-03-22 DIAGNOSIS — E119 Type 2 diabetes mellitus without complications: Secondary | ICD-10-CM | POA: Diagnosis not present

## 2022-03-22 DIAGNOSIS — H401113 Primary open-angle glaucoma, right eye, severe stage: Secondary | ICD-10-CM | POA: Diagnosis not present

## 2022-03-22 DIAGNOSIS — H2511 Age-related nuclear cataract, right eye: Secondary | ICD-10-CM | POA: Diagnosis not present

## 2022-03-22 DIAGNOSIS — Z961 Presence of intraocular lens: Secondary | ICD-10-CM | POA: Diagnosis not present

## 2022-03-22 LAB — HM DIABETES EYE EXAM

## 2022-03-24 ENCOUNTER — Other Ambulatory Visit: Payer: Self-pay | Admitting: Family Medicine

## 2022-05-16 ENCOUNTER — Other Ambulatory Visit: Payer: Self-pay | Admitting: Family Medicine

## 2022-05-16 DIAGNOSIS — E1165 Type 2 diabetes mellitus with hyperglycemia: Secondary | ICD-10-CM

## 2022-05-16 DIAGNOSIS — I10 Essential (primary) hypertension: Secondary | ICD-10-CM

## 2022-06-05 ENCOUNTER — Other Ambulatory Visit: Payer: Self-pay | Admitting: Family Medicine

## 2022-06-21 DIAGNOSIS — H2511 Age-related nuclear cataract, right eye: Secondary | ICD-10-CM | POA: Diagnosis not present

## 2022-06-21 DIAGNOSIS — H401121 Primary open-angle glaucoma, left eye, mild stage: Secondary | ICD-10-CM | POA: Diagnosis not present

## 2022-06-21 DIAGNOSIS — H401113 Primary open-angle glaucoma, right eye, severe stage: Secondary | ICD-10-CM | POA: Diagnosis not present

## 2022-06-21 DIAGNOSIS — Z961 Presence of intraocular lens: Secondary | ICD-10-CM | POA: Diagnosis not present

## 2022-07-05 ENCOUNTER — Ambulatory Visit (INDEPENDENT_AMBULATORY_CARE_PROVIDER_SITE_OTHER): Payer: Medicare HMO | Admitting: Family Medicine

## 2022-07-05 ENCOUNTER — Encounter: Payer: Self-pay | Admitting: Family Medicine

## 2022-07-05 VITALS — BP 154/73 | HR 72 | Temp 98.5°F | Ht 65.0 in | Wt 152.0 lb

## 2022-07-05 DIAGNOSIS — I152 Hypertension secondary to endocrine disorders: Secondary | ICD-10-CM

## 2022-07-05 DIAGNOSIS — J449 Chronic obstructive pulmonary disease, unspecified: Secondary | ICD-10-CM

## 2022-07-05 DIAGNOSIS — I5032 Chronic diastolic (congestive) heart failure: Secondary | ICD-10-CM

## 2022-07-05 DIAGNOSIS — R159 Full incontinence of feces: Secondary | ICD-10-CM | POA: Diagnosis not present

## 2022-07-05 DIAGNOSIS — E1159 Type 2 diabetes mellitus with other circulatory complications: Secondary | ICD-10-CM

## 2022-07-05 DIAGNOSIS — E1169 Type 2 diabetes mellitus with other specified complication: Secondary | ICD-10-CM | POA: Diagnosis not present

## 2022-07-05 DIAGNOSIS — E538 Deficiency of other specified B group vitamins: Secondary | ICD-10-CM

## 2022-07-05 DIAGNOSIS — I700 Atherosclerosis of aorta: Secondary | ICD-10-CM | POA: Diagnosis not present

## 2022-07-05 DIAGNOSIS — Z8673 Personal history of transient ischemic attack (TIA), and cerebral infarction without residual deficits: Secondary | ICD-10-CM

## 2022-07-05 DIAGNOSIS — E1165 Type 2 diabetes mellitus with hyperglycemia: Secondary | ICD-10-CM

## 2022-07-05 DIAGNOSIS — N1832 Chronic kidney disease, stage 3b: Secondary | ICD-10-CM

## 2022-07-05 DIAGNOSIS — Z7984 Long term (current) use of oral hypoglycemic drugs: Secondary | ICD-10-CM

## 2022-07-05 DIAGNOSIS — E785 Hyperlipidemia, unspecified: Secondary | ICD-10-CM

## 2022-07-05 DIAGNOSIS — R198 Other specified symptoms and signs involving the digestive system and abdomen: Secondary | ICD-10-CM

## 2022-07-05 LAB — CMP14+EGFR
AST: 18 IU/L (ref 0–40)
BUN: 23 mg/dL (ref 8–27)
Bilirubin Total: 0.5 mg/dL (ref 0.0–1.2)
Potassium: 5.3 mmol/L — ABNORMAL HIGH (ref 3.5–5.2)
Sodium: 137 mmol/L (ref 134–144)

## 2022-07-05 LAB — LIPID PANEL

## 2022-07-05 LAB — CBC WITH DIFFERENTIAL/PLATELET
Basophils Absolute: 0.1 10*3/uL (ref 0.0–0.2)
Hematocrit: 39.8 % (ref 34.0–46.6)
MCH: 28.5 pg (ref 26.6–33.0)
Monocytes: 7 %
Neutrophils: 73 %

## 2022-07-05 LAB — BAYER DCA HB A1C WAIVED: HB A1C (BAYER DCA - WAIVED): 6.2 % — ABNORMAL HIGH (ref 4.8–5.6)

## 2022-07-05 NOTE — Progress Notes (Signed)
Established Patient Office Visit  Subjective   Patient ID: Heather Oconnor, female    DOB: 01/06/1940  Age: 82 y.o. MRN: [id number]  Chief Complaint  Patient presents with   Annual Exam    HPI T2DM Pt presents for follow up evaluation of Type 2 diabetes mellitus. Patient denies foot ulcerations, increased appetite, nausea, paresthesia of the feet, polydipsia, polyuria, visual disturbances, vomiting, and weight loss.  Current diabetic medications include metformin, glipizide Compliant with meds - Yes  Current monitoring regimen: none Any episodes of hypoglycemia? no  Eye exam current (within one year): yes Is She on ACE inhibitor or angiotensin II receptor blocker?  Yes, losartan Is She on statin? Yes atorvastatin  2. HTN Complaint with meds - Yes Current Medications - amlodipine, bisoprolol, clonidine, hydralazine, spironolactone, valsartan. Checking BP at home ranging 130s/70s Pertinent ROS:  Headache - No Fatigue - No Visual Disturbances - No Chest pain - No Dyspnea - No Palpitations - No LE edema - No  She follows up with cardiology as well.   3. COPD Denies shortness of breath, cough, wheezing.   4. Depression On remeron. Reports well controlled. She is sleeping well.   5. Stool leakage  She previously saw GI in [place]. She reports a long term issue to fecal incontinence and this has been worsening over time. She reports fecal urgency with leakage. She wears and incontinence pad and will have sool on the pad without being aware of it. Reports stool are "mushy". She denies constipation, abdominal pain, constipation, nausea, or vomiting. Now weight loss. She was prescribed some medications previously that were helpful, but too expensive. They had recommend imodium at the time, but she is not taking this now.       07/05/2022    9:54 AM 01/06/2022    9:54 AM 09/01/2021    9:50 AM  Depression screen PHQ 2/9  Decreased Interest 0 0 0  Down, Depressed,  Hopeless 0 0 0  PHQ - 2 Score 0 0 0  Altered sleeping 0 0 0  Tired, decreased energy 0 1 1  Change in appetite 0 0 0  Feeling bad or failure about yourself  0 0 0  Trouble concentrating 0 0 0  Moving slowly or fidgety/restless 0 0 0  Suicidal thoughts 0 0 0  PHQ-9 Score 0 1 1  Difficult doing work/chores Not difficult at all Not difficult at all Not difficult at all      07/05/2022    9:54 AM 01/06/2022    9:54 AM 09/01/2021    9:50 AM 12/03/2020   10:06 AM  GAD 7 : Generalized Anxiety Score  Nervous, Anxious, on Edge 0 0 0 0  Control/stop worrying 0 0 0 0  Worry too much - different things 0 0 0 0  Trouble relaxing 0 0 0 0  Restless 0 0 0 0  Easily annoyed or irritable 0 0 0 0  Afraid - awful might happen 0 0 0 0  Total GAD 7 Score 0 0 0 0  Anxiety Difficulty Not difficult at all Not difficult at all Not difficult at all Not difficult at all      ROS As per HPI.    Objective:     BP (!) 164/75   Pulse 72   Temp 98.5 F (36.9 C)   Ht 5\' 5"  (1.651 m)   Wt 152 lb (68.9 kg)   SpO2 96%   BMI 25.29 kg/m   BP Readings from  Last 3 Encounters:  07/05/22 (!) 164/75  01/06/22 135/70  10/14/21 122/60   Wt Readings from Last 3 Encounters:  07/05/22 152 lb (68.9 kg)  01/06/22 154 lb 8 oz (70.1 kg)  12/02/21 156 lb (70.8 kg)     Physical Exam Vitals and nursing note reviewed.  Constitutional:      General: She is not in acute distress.    Appearance: Normal appearance. She is not ill-appearing, toxic-appearing or diaphoretic.  HENT:     Head: Normocephalic and atraumatic.  Neck:     Thyroid: No thyroid mass, thyromegaly or thyroid tenderness.  Cardiovascular:     Rate and Rhythm: Normal rate and regular rhythm.     Heart sounds: Normal heart sounds. No murmur heard. Pulmonary:     Effort: Pulmonary effort is normal. No respiratory distress.     Breath sounds: Normal breath sounds. No wheezing.  Abdominal:     General: Bowel sounds are normal. There is no  distension.     Palpations: Abdomen is soft.     Tenderness: There is no abdominal tenderness. There is no guarding or rebound.  Musculoskeletal:     Cervical back: Neck supple. No rigidity.     Right lower leg: No edema.     Left lower leg: No edema.  Neurological:     General: No focal deficit present.     Mental Status: She is alert and oriented to person, place, and time.  Psychiatric:        Mood and Affect: Mood normal.        Behavior: Behavior normal.      No results found for any visits on 07/05/22.    The ASCVD Risk score (Arnett DK, et al., 2019) failed to calculate for the following reasons:   The 2019 ASCVD risk score is only valid for ages 31 to 69    Assessment & Plan:   Heather Oconnor was seen today for annual exam.  Diagnoses and all orders for this visit:  Type 2 diabetes mellitus with hyperglycemia, without long-term current use of insulin (HCC) A1c 6.2 today, at goal of <7. Medication changes today: none. She is on an  ACE/ARB and statin. Eye exam: UTD. Foot exam: UTD. Urine micro: UTD. Diet and exercise.  -     Microalbumin / creatinine urine ratio -     CMP14+EGFR -     Bayer DCA Hb A1c Waived -     Lipid panel -     CBC with Differential/Platelet  Hyperlipidemia associated with type 2 diabetes mellitus (HCC) Last LDL 64. On statin.   Hypertension associated with diabetes (HCC) Well controlled on current regimen. Managed by hypertension clinic.   Chronic diastolic CHF (congestive heart failure) (HCC) Euvolemic on exam. On bisoprolol,valsartan. Managed by cardiology.   Atherosclerosis of aorta (HCC) On statin.   Stage 3b chronic kidney disease (CKD) (HCC) CMP pending.   COPD without exacerbation (HCC) Well controlled.   B12 deficiency -     Vitamin B12  Altered bowel function Incontinence of feces, unspecified fecal incontinence type Referral back to GI as symptoms are worsening. Discussed imodium as this was recommend for her previously  by GI. Discussed to discontinue for constipation and use only if needed.  -     Ambulatory referral to Gastroenterology   Return in about 6 months (around 01/05/2023) for chronic follow up.   The patient indicates understanding of these issues and agrees with the plan.  Gabriel Earing, FNP

## 2022-07-05 NOTE — Patient Instructions (Addendum)
May take up to 4 Imodium tablets daily ( 2 in the morning and go from there)

## 2022-07-06 LAB — LIPID PANEL
Chol/HDL Ratio: 4.5 ratio — ABNORMAL HIGH (ref 0.0–4.4)
Cholesterol, Total: 134 mg/dL (ref 100–199)
HDL: 30 mg/dL — ABNORMAL LOW (ref >39)
LDL Chol Calc (NIH): 75 mg/dL (ref 0–99)
Triglycerides: 168 mg/dL — ABNORMAL HIGH (ref 0–149)

## 2022-07-06 LAB — MICROALBUMIN / CREATININE URINE RATIO
Creatinine, Urine: 180.4 mg/dL
Microalb/Creat Ratio: 23 mg/g creat (ref 0–29)
Microalbumin, Urine: 40.7 ug/mL

## 2022-07-06 LAB — CBC WITH DIFFERENTIAL/PLATELET
Basos: 1 %
EOS (ABSOLUTE): 0.2 10*3/uL (ref 0.0–0.4)
Eos: 3 %
Hemoglobin: 13.1 g/dL (ref 11.1–15.9)
Immature Grans (Abs): 0 10*3/uL (ref 0.0–0.1)
Immature Granulocytes: 0 %
Lymphocytes Absolute: 1.5 10*3/uL (ref 0.7–3.1)
Lymphs: 16 %
MCHC: 32.9 g/dL (ref 31.5–35.7)
MCV: 87 fL (ref 79–97)
Monocytes Absolute: 0.7 10*3/uL (ref 0.1–0.9)
Neutrophils Absolute: 7.2 10*3/uL — ABNORMAL HIGH (ref 1.4–7.0)
Platelets: 237 10*3/uL (ref 150–450)
RBC: 4.6 x10E6/uL (ref 3.77–5.28)
RDW: 13.3 % (ref 11.7–15.4)
WBC: 9.7 10*3/uL (ref 3.4–10.8)

## 2022-07-06 LAB — CMP14+EGFR
ALT: 10 IU/L (ref 0–32)
Albumin: 4.4 g/dL (ref 3.7–4.7)
Alkaline Phosphatase: 92 IU/L (ref 44–121)
BUN/Creatinine Ratio: 21 (ref 12–28)
CO2: 19 mmol/L — ABNORMAL LOW (ref 20–29)
Calcium: 10.6 mg/dL — ABNORMAL HIGH (ref 8.7–10.3)
Chloride: 103 mmol/L (ref 96–106)
Creatinine, Ser: 1.08 mg/dL — ABNORMAL HIGH (ref 0.57–1.00)
Globulin, Total: 2.7 g/dL (ref 1.5–4.5)
Glucose: 125 mg/dL — ABNORMAL HIGH (ref 70–99)
Total Protein: 7.1 g/dL (ref 6.0–8.5)
eGFR: 52 mL/min/{1.73_m2} — ABNORMAL LOW (ref >59)

## 2022-07-06 LAB — SPECIMEN STATUS REPORT

## 2022-07-07 LAB — VITAMIN B12: Vitamin B-12: 1138 pg/mL (ref 232–1245)

## 2022-07-19 ENCOUNTER — Encounter: Payer: Self-pay | Admitting: Gastroenterology

## 2022-07-19 ENCOUNTER — Ambulatory Visit: Payer: Medicare HMO | Admitting: Gastroenterology

## 2022-07-19 VITALS — BP 169/72 | HR 75 | Temp 98.0°F | Ht 66.0 in | Wt 157.0 lb

## 2022-07-19 DIAGNOSIS — R159 Full incontinence of feces: Secondary | ICD-10-CM | POA: Diagnosis not present

## 2022-07-19 NOTE — Patient Instructions (Signed)
You can continue Imodium daily as needed to control incontinence. Try 1/2 to 1 tablet each morning. If you go more than 24 hours without a stool, hold Imodium until you have another bowel movement.  Return to the office in 6-8 weeks. Call sooner if you have any questions or concerns.

## 2022-07-19 NOTE — Progress Notes (Signed)
GI Office Note    Referring Provider: Gabriel Earing, FNP Primary Care Physician:  Gabriel Earing, FNP  Primary Gastroenterologist: Roetta Sessions, MD   Chief Complaint   Chief Complaint  Patient presents with   Encopresis     History of Present Illness   Heather Oconnor is a 82 y.o. female presenting today for follow-up.  Last seen in December 2020 with history of complicated pancreatitis.  Gallbladder removal contemplated but not done.  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  Overall pancreas appeared better, following clinically.  Also with history of possible underlying IBS and/or diabetic enteropathy plus or minus metformin effect. Previously did not like Levsin/dicyclomine. Did not like Questran.  Viberzi worked great but cannot afford.   Today: Living with fecal incontinence for years. More frequent incontinence of stool lately. Notes stool in her pad, pad contains the leakage. BM daily for 3 days straight, then may skip a day, then followed by day of loose stools which could cause leakage. No melena, brbpr. No abdominal pain/cramps. Saw PCP couple of weeks ago and was reminded that we had previously advised imodium. She has been back on imodium two daily she has been doing better. Stool formed today. Denies ugi symptoms.   Question of cirrhosis on 06/2018 imaging but subsequent imaging less concerning. Current labs in 07/2022 with normal platelets, albumin, LFTs.   EGD and colonoscopy 2004 showed erosive reflux esophagitis with small sliding hiatal hernia, esophagus dilated, nonerosive antral gastritis, small polyp ablated at the sigmoid colon, small diverticula in the sigmoid colon, external hemorrhoids.  Colonoscopy 2009 with anal canal/external hemorrhoids.   Long redundant colon and left-sided diverticula.  Remainder of colon appeared normal.  Medications   Current Outpatient Medications  Medication Sig Dispense Refill   amLODipine (NORVASC) 10 MG tablet TAKE 1 TABLET EVERY DAY 90 tablet 1   atorvastatin (LIPITOR) 40 MG tablet Take 1 tablet (40 mg total) by mouth daily. 1 tablet every day 90 tablet 3   bisoprolol (ZEBETA) 5 MG tablet TAKE 1 AND 1/2 TABLETS EVERY DAY 135 tablet 0   cloNIDine (CATAPRES) 0.2 MG tablet TAKE 1 TABLET TWICE DAILY 180 tablet 1   clopidogrel (PLAVIX) 75 MG tablet TAKE 1 TABLET EVERY DAY 90 tablet 1   dorzolamide-timolol (COSOPT) 22.3-6.8 MG/ML ophthalmic solution Place 1 drop into both eyes 2 (two) times daily.     glipiZIDE (GLUCOTROL XL) 5 MG 24 hr tablet TAKE 1 TABLET EVERY DAY 90 tablet 0   hydrALAZINE (APRESOLINE) 100 MG tablet TAKE 1 TABLET THREE TIMES DAILY 270 tablet 0   metFORMIN (GLUCOPHAGE) 500 MG tablet TAKE 2 TABLETS TWICE A DAY WITH MEALS 360 tablet 1   mirtazapine (REMERON) 15 MG tablet Take 1 tablet (15 mg total) by mouth at bedtime. 90 tablet 1   pantoprazole (PROTONIX) 40 MG tablet TAKE 1 TABLET EVERY DAY 90 tablet 3   spironolactone (ALDACTONE) 25 MG tablet Take 1 tablet (25 mg total) by mouth daily. 90 tablet 3   TRUE METRIX BLOOD GLUCOSE TEST test strip 1 each by Other route 4 (four) times daily as needed.     TRUEplus Lancets 33G MISC Test BS up to four times daily as needed Dx E11.9 400 each 3   valsartan (DIOVAN) 320 MG tablet TAKE 1 TABLET EVERY DAY (STOP LOSARTAN) 90 tablet 1  vitamin B-12 (CYANOCOBALAMIN) 1000 MCG tablet Take 1,000 mcg by mouth daily.     No current facility-administered medications for this visit.    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   Qualifier: Diagnosis of  By: Diana Eves     Pancreatitis    Transient cerebral ischemia 05/08/2008   Qualifier: Diagnosis of  By: Diana Eves      Past Surgical History   Past Surgical History:  Procedure Laterality Date   COLONOSCOPY  09/07/2007   RMR: anal canal/external hemorrhoids, redundant colon, left-sided diverticula, otherwise normal colonic mucosa    Past Family History   Family History  Problem Relation Age of Onset   Hypertension Mother    Heart attack Father    Hypertension Father    Hypertension Sister    Hypertension Brother    Hypertension Brother    Diabetes Son    Hypertension Son    Colon cancer Neg Hx     Past Social History   Social History   Socioeconomic History   Marital status: Widowed    Spouse name: Not on file   Number of children: 3   Years of education: Not on file   Highest education level: Not on file  Occupational History   Occupation: retired  Tobacco Use   Smoking status: Former    Current packs/day: 0.00    Types: Cigarettes    Start date: 11/26/1997    Quit date: 11/27/2006    Years since quitting: 15.6   Smokeless tobacco: Never  Vaping Use   Vaping status: Never Used  Substance and Sexual Activity   Alcohol use: No    Alcohol/week: 0.0 standard drinks of alcohol   Drug use: No   Sexual activity: Not Currently  Other Topics Concern   Not on file  Social History Narrative   Three children.  All live nearby.   Social Determinants of Health   Financial Resource Strain: Low Risk  (12/02/2021)   Overall Financial Resource Strain (CARDIA)    Difficulty of Paying Living Expenses: Not hard at all  Food Insecurity: No Food Insecurity (12/02/2021)   Hunger Vital Sign    Worried About Running Out  of Food in the Last Year: Never true    Ran Out of Food in the Last Year: Never true  Transportation Needs: No Transportation Needs (11/13/2020)   PRAPARE - Administrator, Civil Service (Medical): No    Lack of Transportation (Non-Medical): No  Physical Activity: Insufficiently Active (12/02/2021)   Exercise Vital Sign    Days of Exercise per Week: 3 days    Minutes of Exercise per Session: 30 min  Stress: No Stress Concern Present (12/02/2021)   [employer] of Occupational Health - Occupational Stress Questionnaire    Feeling of Stress : Not at all  Social Connections: Moderately Isolated (12/02/2021)   Social Connection and Isolation Panel [NHANES]    Frequency of Communication with Friends and Family: More than three times a  week    Frequency of Social Gatherings with Friends and Family: More than three times a week    Attends Religious Services: More than 4 times per year    Active Member of [employer] or Organizations: No    Attends Banker Meetings: Never    Marital Status: Widowed  Intimate Partner Violence: Not At Risk (12/02/2021)   Humiliation, Afraid, Rape, and Kick questionnaire    Fear of Current or Ex-Partner: No    Emotionally Abused: No    Physically Abused: No    Sexually Abused: No    Review of Systems   General: Negative for anorexia, weight loss, fever, chills, fatigue, weakness. Eyes: Negative for vision changes.  ENT: Negative for hoarseness, difficulty swallowing , nasal congestion. CV: Negative for chest pain, angina, palpitations, +dyspnea on exertion, No peripheral edema.  Respiratory: Negative for dyspnea at rest, +dyspnea on exertion, No cough, sputum, wheezing.  GI: See history of present illness. GU:  Negative for dysuria, hematuria, urinary incontinence, urinary frequency, nocturnal urination.  MS: Negative for joint pain, low back pain.  Derm: Negative for rash or itching.  Neuro: Negative for weakness, abnormal  sensation, seizure, frequent headaches, memory loss,  confusion.  Psych: Negative for anxiety, depression, suicidal ideation, hallucinations.  Endo: Negative for unusual weight change.  Heme: Negative for bruising or bleeding. Allergy: Negative for rash or hives.  Physical Exam   BP (!) 169/72 (BP Location: Right Arm, Patient Position: Sitting, Cuff Size: Normal)   Pulse 75   Temp 98 F (36.7 C) (Oral)   Ht 5\' 6"  (1.676 m)   Wt 157 lb (71.2 kg)   SpO2 91%   BMI 25.34 kg/m    General: Well-nourished, well-developed in no acute distress. Some sob with ambulation. Head: Normocephalic, atraumatic.   Eyes: Conjunctiva pink, no icterus. Mouth: Oropharyngeal mucosa moist and pink  Neck: Supple without thyromegaly, masses, or lymphadenopathy.  Lungs: Clear to auscultation bilaterally.  Heart: Regular rate and rhythm, no murmurs rubs or gallops.  Abdomen: Bowel sounds are normal, nontender, nondistended, no hepatosplenomegaly or masses,  no abdominal bruits or hernia, no rebound or guarding.   Rectal: patient declined today Extremities: No lower extremity edema. No clubbing or deformities.  Neuro: Alert and oriented x 4 , grossly normal neurologically.  Skin: Warm and dry, no rash or jaundice.   Psych: Alert and cooperative, normal mood and affect.  Labs   Lab Results  Component Value Date   VITAMINB12 1,138 07/05/2022   Lab Results  Component Value Date   WBC 9.7 07/05/2022   HGB 13.1 07/05/2022   HCT 39.8 07/05/2022   MCV 87 07/05/2022   PLT 237 07/05/2022   Lab Results  Component Value Date   NA 137 07/05/2022   CL 103 07/05/2022   K 5.3 (H) 07/05/2022   CO2 19 (L) 07/05/2022   BUN 23 07/05/2022   CREATININE 1.08 (H) 07/05/2022   EGFR 52 (L) 07/05/2022   CALCIUM 10.6 (H) 07/05/2022   PHOS 2.7 07/11/2018   ALBUMIN 4.4 07/05/2022   GLUCOSE 125 (H) 07/05/2022   Lab Results  Component Value Date   ALT 10 07/05/2022   AST 18 07/05/2022   ALKPHOS 92 07/05/2022    BILITOT 0.5 07/05/2022   Lab Results  Component Value Date   HGBA1C 6.2 (H) 07/05/2022   Lab Results  Component Value Date   TSH 2.310 03/03/2021    Imaging Studies   No results found.  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 Patient stopped taking at some point. Doing better back on imodium. Patient declined rectal exam today. Last colonoscopy 2009.   Previous reported cirrhosis remote with imaging in 06/2018 with questionable cirrhosis but subsequent imaging with less alarming features. Could consider updated liver imaging after next ov if patient agreeable.   The patient was found to have elevated blood pressure when vital signs were checked in the office. The blood pressure was rechecked by the nursing staff and it was found be persistently elevated >140/90 mmHg. I personally advised to the patient to follow up closely with his PCP for hypertension control.   PLAN   Continue imodium try 1/2 to 1 tablet each morning. Hold for constipation.  Return ov in 6-8 weeks. Call sooner if needed.    Leanna Battles. Melvyn Neth, MHS, PA-C [facility]

## 2022-07-29 ENCOUNTER — Other Ambulatory Visit: Payer: Self-pay | Admitting: Family Medicine

## 2022-07-29 DIAGNOSIS — I10 Essential (primary) hypertension: Secondary | ICD-10-CM

## 2022-07-29 DIAGNOSIS — E1165 Type 2 diabetes mellitus with hyperglycemia: Secondary | ICD-10-CM

## 2022-07-29 DIAGNOSIS — Z8673 Personal history of transient ischemic attack (TIA), and cerebral infarction without residual deficits: Secondary | ICD-10-CM

## 2022-07-29 DIAGNOSIS — I5032 Chronic diastolic (congestive) heart failure: Secondary | ICD-10-CM

## 2022-08-11 DIAGNOSIS — Z961 Presence of intraocular lens: Secondary | ICD-10-CM | POA: Diagnosis not present

## 2022-08-11 DIAGNOSIS — H26492 Other secondary cataract, left eye: Secondary | ICD-10-CM | POA: Diagnosis not present

## 2022-08-19 ENCOUNTER — Other Ambulatory Visit: Payer: Self-pay | Admitting: Family Medicine

## 2022-08-31 DIAGNOSIS — H26492 Other secondary cataract, left eye: Secondary | ICD-10-CM | POA: Diagnosis not present

## 2022-09-03 ENCOUNTER — Other Ambulatory Visit (HOSPITAL_BASED_OUTPATIENT_CLINIC_OR_DEPARTMENT_OTHER): Payer: Self-pay | Admitting: Cardiovascular Disease

## 2022-09-03 NOTE — Telephone Encounter (Signed)
Rx request sent to pharmacy.  

## 2022-09-13 NOTE — Progress Notes (Unsigned)
GI Office Note    Referring Provider: Gabriel Earing, FNP Primary Care Physician:  Gabriel Earing, FNP  Primary Gastroenterologist: Roetta Sessions, MD   Chief Complaint   No chief complaint on file.   History of Present Illness   Heather Oconnor is a 82 y.o. female presenting today  for follow up. Last seen in 07/2022 for fecal incontinence for years. 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 Previously did not like Levsin/bentyl. Did not like Questran. Viberzi worked well but could not afford.     Question of cirrhosis on 06/2018 imaging but subsequent imaging less concerning. Current labs in 07/2022 with normal platelets, albumin, LFTs.     Consider updating imaging of liver ***   EGD and colonoscopy 2004 showed erosive reflux esophagitis with small sliding hiatal hernia, esophagus dilated, nonerosive antral gastritis, small polyp ablated at the sigmoid colon, small diverticula in the sigmoid colon, external hemorrhoids.   Colonoscopy 2009 with anal canal/external hemorrhoids.  Long redundant colon and left-sided diverticula.  Remainder of colon appeared normal.   Medications   Current Outpatient Medications  Medication Sig Dispense Refill   amLODipine (NORVASC) 10 MG tablet TAKE 1 TABLET EVERY DAY 90 tablet 0   atorvastatin (LIPITOR) 40 MG tablet Take 1 tablet (40 mg total) by mouth daily. 1 tablet every day 90 tablet 3   bisoprolol (ZEBETA) 5 MG tablet TAKE 1 AND 1/2 TABLETS EVERY DAY 135 tablet 0   cloNIDine (CATAPRES) 0.2 MG tablet TAKE 1 TABLET TWICE DAILY 180 tablet 0   clopidogrel (PLAVIX) 75 MG tablet TAKE 1 TABLET EVERY DAY 90 tablet 0   dorzolamide-timolol (COSOPT) 22.3-6.8 MG/ML ophthalmic solution Place 1 drop into both eyes 2 (two) times daily.     glipiZIDE (GLUCOTROL XL) 5 MG 24 hr tablet TAKE 1 TABLET EVERY DAY 90 tablet 0   hydrALAZINE (APRESOLINE) 100 MG tablet TAKE 1 TABLET THREE TIMES DAILY 270 tablet 0    metFORMIN (GLUCOPHAGE) 500 MG tablet TAKE 2 TABLETS TWICE DAILY WITH MEALS 360 tablet 0   mirtazapine (REMERON) 15 MG tablet Take 1 tablet (15 mg total) by mouth at bedtime. 90 tablet 1   pantoprazole (PROTONIX) 40 MG tablet TAKE 1 TABLET EVERY DAY 90 tablet 3   spironolactone (ALDACTONE) 25 MG tablet Take 1 tablet (25 mg total) by mouth daily. 90 tablet 3   TRUE METRIX BLOOD GLUCOSE TEST test strip 1 each by Other route 4 (four) times daily as needed.     TRUEplus Lancets 33G MISC Test BS up to four times daily as needed Dx E11.9 400 each 3   valsartan (DIOVAN) 320 MG tablet Take 1 tablet (320 mg total) by mouth daily. 90 tablet 1   vitamin B-12 (CYANOCOBALAMIN) 1000 MCG tablet Take 1,000 mcg by mouth daily.     No current facility-administered medications for this visit.    Allergies   Allergies as of 09/14/2022 - Review Complete 07/19/2022  Allergen Reaction Noted   Hctz [hydrochlorothiazide]  07/13/2018   Lisinopril  12/07/2018     Past Medical History   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   Qualifier: Diagnosis of  By: Diana Eves     Pancreatitis    Transient cerebral ischemia 05/08/2008   Qualifier: Diagnosis of  By: Diana Eves      Past Surgical History   Past Surgical History:  Procedure Laterality Date   COLONOSCOPY  09/07/2007   RMR: anal canal/external hemorrhoids, redundant colon, left-sided diverticula, otherwise normal colonic mucosa    Past Family History   Family History  Problem Relation Age of Onset   Hypertension Mother    Heart attack Father    Hypertension Father    Hypertension Sister    Hypertension Brother    Hypertension Brother    Diabetes Son    Hypertension Son    Colon  cancer Neg Hx     Past Social History   Social History   Socioeconomic History   Marital status: Widowed    Spouse name: Not on file   Number of children: 3   Years of education: Not on file   Highest education level: Not on file  Occupational History   Occupation: retired  Tobacco Use   Smoking status: Former    Current packs/day: 0.00    Types: Cigarettes    Start date: 11/26/1997    Quit date: 11/27/2006    Years since quitting: 15.8   Smokeless tobacco: Never  Vaping Use   Vaping status: Never Used  Substance and Sexual Activity   Alcohol use: No    Alcohol/week: 0.0 standard drinks of alcohol   Drug use: No   Sexual activity: Not Currently  Other Topics Concern   Not on file  Social History Narrative   Three children.  All live nearby.   Social Determinants of Health   Financial Resource Strain: Low Risk  (12/02/2021)   Overall Financial Resource Strain (CARDIA)    Difficulty of Paying Living Expenses: Not hard at all  Food Insecurity: No Food Insecurity (12/02/2021)   Hunger Vital Sign    Worried About Running Out of Food in the Last Year: Never true    Ran Out of Food in the Last Year: Never true  Transportation Needs: No Transportation Needs (11/13/2020)   PRAPARE - Administrator, Civil Service (Medical): No    Lack of Transportation (Non-Medical): No  Physical Activity: Insufficiently Active (12/02/2021)   Exercise Vital Sign    Days of Exercise per Week: 3 days    Minutes of Exercise per Session: 30 min  Stress: No Stress Concern Present (12/02/2021)   [employer] of Occupational Health - Occupational Stress Questionnaire    Feeling of Stress : Not at all  Social Connections: Moderately Isolated (12/02/2021)   Social Connection and Isolation Panel [NHANES]    Frequency of Communication with Friends and Family: More than three times a week    Frequency of Social Gatherings with Friends and Family: More than three times a week     Attends Religious Services: More than 4 times per year    Active Member of [employer] or Organizations: No    Attends Banker Meetings: Never    Marital Status: Widowed  Intimate Partner Violence: Not At Risk (12/02/2021)   Humiliation, Afraid, Rape, and Kick questionnaire    Fear of Current or Ex-Partner: No    Emotionally Abused: No    Physically Abused: No    Sexually Abused: No    Review of Systems   General: Negative for anorexia, weight loss, fever, chills, fatigue, weakness. ENT: Negative for hoarseness, difficulty swallowing , nasal congestion.  CV: Negative for chest pain, angina, palpitations, dyspnea on exertion, peripheral edema.  Respiratory: Negative for dyspnea at rest, dyspnea on exertion, cough, sputum, wheezing.  GI: See history of present illness. GU:  Negative for dysuria, hematuria, urinary incontinence, urinary frequency, nocturnal urination.  Endo: Negative for unusual weight change.     Physical Exam   There were no vitals taken for this visit.   General: Well-nourished, well-developed in no acute distress.  Eyes: No icterus. Mouth: Oropharyngeal mucosa moist and pink , no lesions erythema or exudate. Lungs: Clear to auscultation bilaterally.  Heart: Regular rate and rhythm, no murmurs rubs or gallops.  Abdomen: Bowel sounds are normal, nontender, nondistended, no hepatosplenomegaly or masses,  no abdominal bruits or hernia , no rebound or guarding.  Rectal: ***  Extremities: No lower extremity edema. No clubbing or deformities. Neuro: Alert and oriented x 4   Skin: Warm and dry, no jaundice.   Psych: Alert and cooperative, normal mood and affect.  Labs   Lab Results  Component Value Date   VITAMINB12 1,138 07/05/2022   Lab Results  Component Value Date   NA 137 07/05/2022   CL 103 07/05/2022   K 5.3 (H) 07/05/2022   CO2 19 (L) 07/05/2022   BUN 23 07/05/2022   CREATININE 1.08 (H) 07/05/2022   EGFR 52 (L) 07/05/2022   CALCIUM 10.6  (H) 07/05/2022   PHOS 2.7 07/11/2018   ALBUMIN 4.4 07/05/2022   GLUCOSE 125 (H) 07/05/2022   Lab Results  Component Value Date   ALT 10 07/05/2022   AST 18 07/05/2022   ALKPHOS 92 07/05/2022   BILITOT 0.5 07/05/2022   Lab Results  Component Value Date   HGBA1C 6.2 (H) 07/05/2022    Imaging Studies   No results found.  Assessment       PLAN   ***   Leanna Battles. Melvyn Neth, MHS, PA-C [facility]

## 2022-09-14 ENCOUNTER — Encounter: Payer: Self-pay | Admitting: Gastroenterology

## 2022-09-14 ENCOUNTER — Ambulatory Visit: Payer: Medicare HMO | Admitting: Gastroenterology

## 2022-09-14 VITALS — BP 129/67 | HR 74 | Temp 98.6°F | Ht 63.0 in | Wt 150.8 lb

## 2022-09-14 DIAGNOSIS — R159 Full incontinence of feces: Secondary | ICD-10-CM | POA: Diagnosis not present

## 2022-09-14 NOTE — Patient Instructions (Signed)
Continue imodium 1/2 to 1 tablet daily to control incontinence.  If you have any questions or concerns please reach out.  Return to the office as needed.

## 2022-09-29 DIAGNOSIS — H401121 Primary open-angle glaucoma, left eye, mild stage: Secondary | ICD-10-CM | POA: Diagnosis not present

## 2022-09-29 DIAGNOSIS — Z961 Presence of intraocular lens: Secondary | ICD-10-CM | POA: Diagnosis not present

## 2022-09-29 DIAGNOSIS — H401113 Primary open-angle glaucoma, right eye, severe stage: Secondary | ICD-10-CM | POA: Diagnosis not present

## 2022-10-10 ENCOUNTER — Other Ambulatory Visit: Payer: Self-pay | Admitting: Family Medicine

## 2022-10-10 DIAGNOSIS — Z8673 Personal history of transient ischemic attack (TIA), and cerebral infarction without residual deficits: Secondary | ICD-10-CM

## 2022-10-10 DIAGNOSIS — E1165 Type 2 diabetes mellitus with hyperglycemia: Secondary | ICD-10-CM

## 2022-10-10 DIAGNOSIS — I10 Essential (primary) hypertension: Secondary | ICD-10-CM

## 2022-10-10 DIAGNOSIS — I5032 Chronic diastolic (congestive) heart failure: Secondary | ICD-10-CM

## 2022-10-16 ENCOUNTER — Other Ambulatory Visit: Payer: Self-pay | Admitting: Family

## 2022-10-16 DIAGNOSIS — Z8673 Personal history of transient ischemic attack (TIA), and cerebral infarction without residual deficits: Secondary | ICD-10-CM

## 2022-10-16 DIAGNOSIS — I700 Atherosclerosis of aorta: Secondary | ICD-10-CM

## 2022-10-16 DIAGNOSIS — E782 Mixed hyperlipidemia: Secondary | ICD-10-CM

## 2022-10-16 DIAGNOSIS — E1165 Type 2 diabetes mellitus with hyperglycemia: Secondary | ICD-10-CM

## 2022-10-31 ENCOUNTER — Other Ambulatory Visit: Payer: Self-pay | Admitting: Family Medicine

## 2022-11-03 ENCOUNTER — Ambulatory Visit: Payer: Medicare HMO | Admitting: Family Medicine

## 2022-11-03 ENCOUNTER — Encounter: Payer: Self-pay | Admitting: Family Medicine

## 2022-11-03 ENCOUNTER — Other Ambulatory Visit: Payer: Medicare HMO

## 2022-11-03 VITALS — BP 135/60 | HR 68 | Temp 97.9°F | Ht 63.0 in | Wt 142.2 lb

## 2022-11-03 DIAGNOSIS — F321 Major depressive disorder, single episode, moderate: Secondary | ICD-10-CM

## 2022-11-03 DIAGNOSIS — I152 Hypertension secondary to endocrine disorders: Secondary | ICD-10-CM

## 2022-11-03 DIAGNOSIS — E1169 Type 2 diabetes mellitus with other specified complication: Secondary | ICD-10-CM | POA: Diagnosis not present

## 2022-11-03 DIAGNOSIS — E1165 Type 2 diabetes mellitus with hyperglycemia: Secondary | ICD-10-CM

## 2022-11-03 DIAGNOSIS — I5032 Chronic diastolic (congestive) heart failure: Secondary | ICD-10-CM | POA: Diagnosis not present

## 2022-11-03 DIAGNOSIS — J449 Chronic obstructive pulmonary disease, unspecified: Secondary | ICD-10-CM | POA: Diagnosis not present

## 2022-11-03 DIAGNOSIS — N1832 Chronic kidney disease, stage 3b: Secondary | ICD-10-CM

## 2022-11-03 DIAGNOSIS — Z8673 Personal history of transient ischemic attack (TIA), and cerebral infarction without residual deficits: Secondary | ICD-10-CM

## 2022-11-03 DIAGNOSIS — E1159 Type 2 diabetes mellitus with other circulatory complications: Secondary | ICD-10-CM

## 2022-11-03 DIAGNOSIS — E785 Hyperlipidemia, unspecified: Secondary | ICD-10-CM

## 2022-11-03 LAB — BAYER DCA HB A1C WAIVED: HB A1C (BAYER DCA - WAIVED): 5.5 % (ref 4.8–5.6)

## 2022-11-03 NOTE — Progress Notes (Signed)
Established Patient Office Visit  Subjective   Patient ID: Heather Oconnor, female    DOB: 1940-06-16  Age: 82 y.o. MRN: [id number]  Chief Complaint  Patient presents with   Medical Management of Chronic Issues   Diabetes   Hyperlipidemia   Chronic Kidney Disease    HPI T2DM Pt presents for follow up evaluation of Type 2 diabetes mellitus. Patient denies foot ulcerations, increased appetite, nausea, paresthesia of the feet, polydipsia, polyuria, visual disturbances, vomiting, and weight loss.  Current diabetic medications include metformin, glipizide Compliant with meds - Yes  Current monitoring regimen: none Any episodes of hypoglycemia? no  Eye exam current (within one year): yes Is She on ACE inhibitor or angiotensin II receptor blocker?  Yes, losartan Is She on statin? Yes atorvastatin  2. HTN Complaint with meds - Yes Current Medications - amlodipine, bisoprolol, clonidine, spironolactone, valsartan. Checking BP at home ranging 130s/70s Pertinent ROS:  Headache - No Visual Disturbances - No Chest pain - No Dyspnea - No Palpitations - No LE edema - No  Established with HTN clinic.   3. COPD Denies shortness of breath, cough, wheezing.   4. Depression She has to put down her dog today that she has had for many years. She is very sad about this as the dog has been her main companion since her husband passed. Taking 1/2 tablet of remeron daily. She has been sleeping ok. Has some nights were she doesn't sleep well. Has been feeling tired lately. Reports appetite is good.   5. GERD Compliant with protonix. She has some nausea after eating. No other symptoms.       11/03/2022    9:45 AM 07/05/2022    9:54 AM 01/06/2022    9:54 AM  Depression screen PHQ 2/9  Decreased Interest 0 0 0  Down, Depressed, Hopeless 0 0 0  PHQ - 2 Score 0 0 0  Altered sleeping 0 0 0  Tired, decreased energy 0 0 1  Change in appetite 0 0 0  Feeling bad or failure about yourself  0  0 0  Trouble concentrating 0 0 0  Moving slowly or fidgety/restless 0 0 0  Suicidal thoughts 0 0 0  PHQ-9 Score 0 0 1  Difficult doing work/chores Not difficult at all Not difficult at all Not difficult at all      11/03/2022    9:44 AM 07/05/2022    9:54 AM 01/06/2022    9:54 AM 09/01/2021    9:50 AM  GAD 7 : Generalized Anxiety Score  Nervous, Anxious, on Edge 0 0 0 0  Control/stop worrying 0 0 0 0  Worry too much - different things 0 0 0 0  Trouble relaxing 0 0 0 0  Restless 0 0 0 0  Easily annoyed or irritable 0 0 0 0  Afraid - awful might happen 0 0 0 0  Total GAD 7 Score 0 0 0 0  Anxiety Difficulty Not difficult at all Not difficult at all Not difficult at all Not difficult at all      ROS As per HPI.    Objective:     BP 135/60   Pulse 68   Temp 97.9 F (36.6 C) (Temporal)   Ht 5\' 3"  (1.6 m)   Wt 142 lb 4 oz (64.5 kg)   SpO2 98%   BMI 25.20 kg/m   BP Readings from Last 3 Encounters:  11/03/22 135/60  09/14/22 129/67  07/19/22 (!) 169/72  Wt Readings from Last 3 Encounters:  11/03/22 142 lb 4 oz (64.5 kg)  09/14/22 150 lb 12.8 oz (68.4 kg)  07/19/22 157 lb (71.2 kg)     Physical Exam Vitals and nursing note reviewed.  Constitutional:      General: She is not in acute distress.    Appearance: Normal appearance. She is not ill-appearing, toxic-appearing or diaphoretic.  HENT:     Head: Normocephalic and atraumatic.  Neck:     Thyroid: No thyroid mass, thyromegaly or thyroid tenderness.  Cardiovascular:     Rate and Rhythm: Normal rate and regular rhythm.     Heart sounds: Normal heart sounds. No murmur heard. Pulmonary:     Effort: Pulmonary effort is normal. No respiratory distress.     Breath sounds: Normal breath sounds. No wheezing.  Abdominal:     General: Bowel sounds are normal. There is no distension.     Palpations: Abdomen is soft.     Tenderness: There is no abdominal tenderness. There is no guarding or rebound.  Musculoskeletal:      Cervical back: Neck supple. No rigidity.     Right lower leg: No edema.     Left lower leg: No edema.  Neurological:     General: No focal deficit present.     Mental Status: She is alert and oriented to person, place, and time.  Psychiatric:        Mood and Affect: Mood normal.        Behavior: Behavior normal.      No results found for any visits on 11/03/22.    The ASCVD Risk score (Arnett DK, et al., 2019) failed to calculate for the following reasons:   The 2019 ASCVD risk score is only valid for ages 38 to 34    Assessment & Plan:    Elyzah was seen today for medical management of chronic issues, diabetes, hyperlipidemia and chronic kidney disease.  Diagnoses and all orders for this visit:  Type 2 diabetes mellitus with hyperglycemia, without long-term current use of insulin (HCC) A1c 5.5 today, at goal of <7. Medication changes today: will d/c glipizide. She is on an ACE/ARB and statin. Eye exam: UTD, will request records. Foot exam: today. Urine micro: UTD. Diet and exercise.  -     Bayer DCA Hb A1c Waived  Stage 3b chronic kidney disease (CKD) (HCC) Avoid NSAIDs.  On ARB.  -     BMP8+EGFR -     VITAMIN D 25 Hydroxy (Vit-D Deficiency, Fractures)  Hyperlipidemia associated with type 2 diabetes mellitus (HCC) Last LDL at 64. On statin.   Hypertension associated with diabetes (HCC) Well controlled on current regimen.   Chronic diastolic CHF (congestive heart failure) (HCC) Euvolemic on exam.   History of CVA in adulthood On statin and plavix.   COPD without exacerbation (HCC) Denies symptoms.   Moderate major depression (HCC) Discussed fatigue likely due to increase depression related to her dog's illness and having to put her dog down. She also hasn't been sleeping well. She can increase her remeron to a full tablet. She will let me know if this doesn't improve.     Return in about 6 months (around 05/04/2023) for CPE.   The patient indicates  understanding of these issues and agrees with the plan.  Gabriel Earing, FNP

## 2022-11-03 NOTE — Patient Instructions (Signed)
You can stop taking glipizide daily.

## 2022-11-04 ENCOUNTER — Other Ambulatory Visit: Payer: Self-pay | Admitting: Family Medicine

## 2022-11-04 DIAGNOSIS — E875 Hyperkalemia: Secondary | ICD-10-CM

## 2022-11-04 DIAGNOSIS — E559 Vitamin D deficiency, unspecified: Secondary | ICD-10-CM

## 2022-11-04 LAB — BMP8+EGFR
BUN/Creatinine Ratio: 22 (ref 12–28)
BUN: 25 mg/dL (ref 8–27)
CO2: 18 mmol/L — ABNORMAL LOW (ref 20–29)
Calcium: 9.4 mg/dL (ref 8.7–10.3)
Chloride: 107 mmol/L — ABNORMAL HIGH (ref 96–106)
Creatinine, Ser: 1.13 mg/dL — ABNORMAL HIGH (ref 0.57–1.00)
Glucose: 84 mg/dL (ref 70–99)
Potassium: 5.6 mmol/L — ABNORMAL HIGH (ref 3.5–5.2)
Sodium: 143 mmol/L (ref 134–144)
eGFR: 49 mL/min/{1.73_m2} — ABNORMAL LOW (ref >59)

## 2022-11-04 LAB — VITAMIN D 25 HYDROXY (VIT D DEFICIENCY, FRACTURES): Vit D, 25-Hydroxy: 4.1 ng/mL — ABNORMAL LOW (ref 30.0–100.0)

## 2022-11-04 MED ORDER — VITAMIN D (ERGOCALCIFEROL) 1.25 MG (50000 UNIT) PO CAPS: 50000.0000 [IU] | ORAL_CAPSULE | ORAL | 0 refills | Status: DC

## 2022-11-08 ENCOUNTER — Other Ambulatory Visit: Payer: Medicare HMO

## 2022-11-08 ENCOUNTER — Other Ambulatory Visit: Payer: Self-pay | Admitting: Family

## 2022-11-08 DIAGNOSIS — E1165 Type 2 diabetes mellitus with hyperglycemia: Secondary | ICD-10-CM

## 2022-11-08 DIAGNOSIS — E782 Mixed hyperlipidemia: Secondary | ICD-10-CM

## 2022-11-08 DIAGNOSIS — E875 Hyperkalemia: Secondary | ICD-10-CM | POA: Diagnosis not present

## 2022-11-08 DIAGNOSIS — I700 Atherosclerosis of aorta: Secondary | ICD-10-CM

## 2022-11-08 DIAGNOSIS — Z8673 Personal history of transient ischemic attack (TIA), and cerebral infarction without residual deficits: Secondary | ICD-10-CM

## 2022-11-09 LAB — BMP8+EGFR
BUN/Creatinine Ratio: 28 (ref 12–28)
BUN: 29 mg/dL — ABNORMAL HIGH (ref 8–27)
CO2: 19 mmol/L — ABNORMAL LOW (ref 20–29)
Calcium: 9.2 mg/dL (ref 8.7–10.3)
Chloride: 103 mmol/L (ref 96–106)
Creatinine, Ser: 1.04 mg/dL — ABNORMAL HIGH (ref 0.57–1.00)
Glucose: 136 mg/dL — ABNORMAL HIGH (ref 70–99)
Potassium: 5.7 mmol/L — ABNORMAL HIGH (ref 3.5–5.2)
Sodium: 139 mmol/L (ref 134–144)
eGFR: 54 mL/min/{1.73_m2} — ABNORMAL LOW (ref >59)

## 2022-11-10 ENCOUNTER — Other Ambulatory Visit: Payer: Self-pay

## 2022-11-10 DIAGNOSIS — E875 Hyperkalemia: Secondary | ICD-10-CM

## 2022-11-18 ENCOUNTER — Other Ambulatory Visit: Payer: Medicare HMO

## 2022-11-18 DIAGNOSIS — E875 Hyperkalemia: Secondary | ICD-10-CM | POA: Diagnosis not present

## 2022-11-18 LAB — BMP8+EGFR
BUN/Creatinine Ratio: 18 (ref 12–28)
BUN: 15 mg/dL (ref 8–27)
CO2: 21 mmol/L (ref 20–29)
Calcium: 9.2 mg/dL (ref 8.7–10.3)
Chloride: 105 mmol/L (ref 96–106)
Creatinine, Ser: 0.83 mg/dL (ref 0.57–1.00)
Glucose: 136 mg/dL — ABNORMAL HIGH (ref 70–99)
Potassium: 5.6 mmol/L — ABNORMAL HIGH (ref 3.5–5.2)
Sodium: 142 mmol/L (ref 134–144)
eGFR: 70 mL/min/{1.73_m2} (ref >59)

## 2022-11-19 ENCOUNTER — Other Ambulatory Visit: Payer: Self-pay | Admitting: Family Medicine

## 2022-11-19 ENCOUNTER — Other Ambulatory Visit: Payer: Self-pay | Admitting: *Deleted

## 2022-11-19 DIAGNOSIS — E875 Hyperkalemia: Secondary | ICD-10-CM

## 2022-11-19 MED ORDER — SODIUM POLYSTYRENE SULFONATE 15 GM/60ML CO SUSP: 15.0000 g | Freq: Once | 0 refills | Status: AC

## 2022-12-06 ENCOUNTER — Other Ambulatory Visit: Payer: Medicare HMO

## 2022-12-06 DIAGNOSIS — E875 Hyperkalemia: Secondary | ICD-10-CM | POA: Diagnosis not present

## 2022-12-07 ENCOUNTER — Ambulatory Visit (INDEPENDENT_AMBULATORY_CARE_PROVIDER_SITE_OTHER): Payer: Medicare HMO

## 2022-12-07 VITALS — Ht 63.0 in | Wt 142.0 lb

## 2022-12-07 DIAGNOSIS — Z0000 Encounter for general adult medical examination without abnormal findings: Secondary | ICD-10-CM | POA: Diagnosis not present

## 2022-12-07 LAB — BMP8+EGFR
BUN/Creatinine Ratio: 15 (ref 12–28)
BUN: 14 mg/dL (ref 8–27)
CO2: 20 mmol/L (ref 20–29)
Calcium: 9.1 mg/dL (ref 8.7–10.3)
Chloride: 98 mmol/L (ref 96–106)
Creatinine, Ser: 0.92 mg/dL (ref 0.57–1.00)
Glucose: 140 mg/dL — ABNORMAL HIGH (ref 70–99)
Potassium: 4.8 mmol/L (ref 3.5–5.2)
Sodium: 138 mmol/L (ref 134–144)
eGFR: 62 mL/min/{1.73_m2} (ref >59)

## 2022-12-07 NOTE — Progress Notes (Signed)
Subjective:   Heather Oconnor is a 82 y.o. female who presents for Medicare Annual (Subsequent) preventive examination.  Visit Complete: Virtual I connected with  Myrene Galas on 12/07/22 by a audio enabled telemedicine application and verified that I am speaking with the correct person using two identifiers.  Patient Location: Home  Provider Location: Home Office  I discussed the limitations of evaluation and management by telemedicine. The patient expressed understanding and agreed to proceed.  Vital Signs: Because this visit was a virtual/telehealth visit, some criteria may be missing or patient reported. Any vitals not documented were not able to be obtained and vitals that have been documented are patient reported.  Cardiac Risk Factors include: advanced age (>16men, >76 women);hypertension;diabetes mellitus;dyslipidemia     Objective:    Today's Vitals   12/07/22 1439  Weight: 142 lb (64.4 kg)  Height: 5\' 3"  (1.6 m)   Body mass index is 25.15 kg/m.     12/07/2022    4:48 PM 12/02/2021   10:43 AM 11/13/2020   11:15 AM 07/11/2018    9:09 AM 07/04/2018    6:44 PM 07/04/2018    3:22 PM 06/12/2018    2:38 PM  Advanced Directives  Does Patient Have a Medical Advance Directive? No No Yes No No No No  Type of Surveyor, minerals;Living will      Copy of Healthcare Power of Attorney in Chart?   No - copy requested      Would patient like information on creating a medical advance directive? Yes (MAU/Ambulatory/Procedural Areas - Information given) No - Patient declined  No - Patient declined   No - Patient declined    Current Medications (verified) Outpatient Encounter Medications as of 12/07/2022  Medication Sig   amLODipine (NORVASC) 10 MG tablet TAKE 1 TABLET EVERY DAY   aspirin EC 81 MG tablet Take 81 mg by mouth daily. Swallow whole.   atorvastatin (LIPITOR) 40 MG tablet TAKE 1 TABLET EVERY DAY   bisoprolol (ZEBETA) 5 MG tablet TAKE 1  AND 1/2 TABLETS EVERY DAY   cloNIDine (CATAPRES) 0.2 MG tablet TAKE 1 TABLET TWICE DAILY   clopidogrel (PLAVIX) 75 MG tablet TAKE 1 TABLET EVERY DAY   dorzolamide-timolol (COSOPT) 22.3-6.8 MG/ML ophthalmic solution Place 1 drop into both eyes 2 (two) times daily.   hydrALAZINE (APRESOLINE) 100 MG tablet TAKE 1 TABLET THREE TIMES DAILY   metFORMIN (GLUCOPHAGE) 500 MG tablet TAKE 2 TABLETS TWICE DAILY WITH MEALS   mirtazapine (REMERON) 15 MG tablet Take 1 tablet (15 mg total) by mouth at bedtime.   pantoprazole (PROTONIX) 40 MG tablet TAKE 1 TABLET EVERY DAY   spironolactone (ALDACTONE) 25 MG tablet Take 1 tablet (25 mg total) by mouth daily.   TRUE METRIX BLOOD GLUCOSE TEST test strip 1 each by Other route 4 (four) times daily as needed.   TRUEplus Lancets 33G MISC Test BS up to four times daily as needed Dx E11.9   valsartan (DIOVAN) 320 MG tablet Take 1 tablet (320 mg total) by mouth daily.   vitamin B-12 (CYANOCOBALAMIN) 1000 MCG tablet Take 1,000 mcg by mouth daily.   Vitamin D, Ergocalciferol, (DRISDOL) 1.25 MG (50000 UNIT) CAPS capsule Take 1 capsule (50,000 Units total) by mouth every 7 (seven) days.   No facility-administered encounter medications on file as of 12/07/2022.    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   Qualifier: Diagnosis of  By: Diana Eves     Past Surgical History:  Procedure Laterality Date   COLONOSCOPY  09/07/2007   RMR: anal canal/external hemorrhoids, redundant colon, left-sided diverticula, otherwise normal colonic mucosa    Family History  Problem Relation Age of Onset   Hypertension Mother    Heart attack Father    Hypertension Father    Hypertension Sister    Hypertension Brother    Hypertension Brother    Diabetes Son    Hypertension Son    Colon cancer Neg Hx    Social History   Socioeconomic History   Marital status: Widowed    Spouse name: Not on file   Number of children: 3   Years of education: Not on file   Highest education level: Not on file  Occupational History   Occupation: retired  Tobacco Use   Smoking status: Former    Current packs/day: 0.00    Types: Cigarettes    Start date: 11/26/1997    Quit date: 11/27/2006    Years since quitting: 16.0   Smokeless tobacco: Never  Vaping Use   Vaping status: Never Used  Substance and Sexual Activity   Alcohol use: No    Alcohol/week: 0.0 standard drinks of alcohol   Drug use: No   Sexual activity: Not Currently  Other Topics Concern   Not on file  Social History Narrative   Three children.  All live nearby.   Social Determinants of Health   Financial Resource Strain: Low Risk  (12/07/2022)   Overall Financial Resource Strain (CARDIA)    Difficulty of Paying Living Expenses: Not hard at all  Food Insecurity: No Food Insecurity (12/07/2022)   Hunger Vital Sign    Worried About Running Out of Food in the Last Year: Never true    Ran Out of Food in the Last Year: Never true  Transportation Needs: No Transportation Needs (12/07/2022)   PRAPARE - Administrator, Civil Service (Medical): No    Lack of Transportation (Non-Medical): No  Physical Activity: Insufficiently Active (12/07/2022)   Exercise Vital Sign    Days of Exercise per Week: 3 days    Minutes of Exercise per Session: 30 min  Stress: No Stress Concern Present (12/07/2022)   [employer] of Occupational Health - Occupational Stress Questionnaire    Feeling of Stress : Not at all  Social Connections: Moderately Isolated (12/07/2022)   Social  Connection and Isolation Panel [NHANES]    Frequency of Communication with Friends and Family: More than three times a week    Frequency of Social Gatherings with Friends and Family: Three times a week    Attends Religious Services: More than 4 times per year    Active Member of Clubs or Organizations: No    Attends Banker Meetings: Never    Marital Status: Widowed    Tobacco Counseling Counseling given: Not Answered   Clinical Intake:  Pre-visit preparation completed: Yes  Pain : No/denies pain     Diabetes: Yes CBG done?: No Did pt. bring in CBG monitor from  home?: No  How often do you need to have someone help you when you read instructions, pamphlets, or other written materials from your doctor or pharmacy?: 1 - Never  Interpreter Needed?: No  Information entered by :: Kandis Fantasia LPN   Activities of Daily Living    12/07/2022    4:47 PM  In your present state of health, do you have any difficulty performing the following activities:  Hearing? 0  Vision? 0  Difficulty concentrating or making decisions? 0  Walking or climbing stairs? 0  Dressing or bathing? 0  Doing errands, shopping? 0  Preparing Food and eating ? N  Using the Toilet? N  In the past six months, have you accidently leaked urine? N  Do you have problems with loss of bowel control? N  Managing your Medications? N  Managing your Finances? N  Housekeeping or managing your Housekeeping? N    Patient Care Team: Gabriel Earing, FNP as PCP - General (Family Medicine) Rollene Rotunda, MD as PCP - Cardiology (Cardiology) Jena Gauss Gerrit Friends, MD as Consulting Physician (Gastroenterology) Bond, Doran Stabler, MD as Consulting Physician (Ophthalmology) Smitty Cords, OD (Optometry)  Indicate any recent Medical Services you may have received from other than [facility] providers in the past year (date may be approximate).     Assessment:   This is a routine wellness examination for  [place].  Hearing/Vision screen Hearing Screening - Comments:: Denies hearing difficulties   Vision Screening - Comments:: Wears rx glasses - up to date with routine eye exams with Dr. Smitty Cords     Goals Addressed   None   Depression Screen    12/07/2022    4:46 PM 11/03/2022    9:45 AM 07/05/2022    9:54 AM 01/06/2022    9:54 AM 09/01/2021    9:50 AM 04/27/2021   12:08 PM 03/03/2021    9:42 AM  PHQ 2/9 Scores  PHQ - 2 Score 0 0 0 0 0 0 0  PHQ- 9 Score 0 0 0 1 1  1     Fall Risk    12/07/2022    4:46 PM 11/03/2022    9:44 AM 07/05/2022    9:54 AM 01/06/2022    9:54 AM 12/02/2021   10:40 AM  Fall Risk   Falls in the past year? 0 0 0 0 0  Number falls in past yr: 0  0  0  Injury with Fall? 0  0  0  Risk for fall due to : No Fall Risks  No Fall Risks  No Fall Risks  Follow up Falls prevention discussed;Education provided;Falls evaluation completed  Education provided  Falls prevention discussed    MEDICARE RISK AT HOME: Medicare Risk at Home Any stairs in or around the home?: No If so, are there any without handrails?: No Home free of loose throw rugs in walkways, pet beds, electrical cords, etc?: Yes Adequate lighting in your home to reduce risk of falls?: Yes Life alert?: No Use of a cane, walker or w/c?: No Grab bars in the bathroom?: Yes Shower chair or bench in shower?: No Elevated toilet seat or a handicapped toilet?: Yes  TIMED UP AND GO:  Was the test performed?  No    Cognitive Function:        12/07/2022    4:48 PM 12/02/2021   10:44 AM 11/13/2020   10:48 AM  6CIT Screen  What Year? 0 points 0 points 0 points  What month? 0 points 0  points 0 points  What time? 0 points 0 points 0 points  Count back from 20 0 points 0 points 0 points  Months in reverse 0 points 0 points 4 points  Repeat phrase 0 points 0 points 6 points  Total Score 0 points 0 points 10 points    Immunizations  There is no immunization history on file for this  patient.  TDAP status: Due, Education has been provided regarding the importance of this vaccine. Advised may receive this vaccine at local pharmacy or Health Dept. Aware to provide a copy of the vaccination record if obtained from local pharmacy or Health Dept. Verbalized acceptance and understanding.  Flu Vaccine status: Declined, Education has been provided regarding the importance of this vaccine but patient still declined. Advised may receive this vaccine at local pharmacy or Health Dept. Aware to provide a copy of the vaccination record if obtained from local pharmacy or Health Dept. Verbalized acceptance and understanding.  Pneumococcal vaccine status: Declined,  Education has been provided regarding the importance of this vaccine but patient still declined. Advised may receive this vaccine at local pharmacy or Health Dept. Aware to provide a copy of the vaccination record if obtained from local pharmacy or Health Dept. Verbalized acceptance and understanding.   Covid-19 vaccine status: Declined, Education has been provided regarding the importance of this vaccine but patient still declined. Advised may receive this vaccine at local pharmacy or Health Dept.or vaccine clinic. Aware to provide a copy of the vaccination record if obtained from local pharmacy or Health Dept. Verbalized acceptance and understanding.  Qualifies for Shingles Vaccine? Yes   Zostavax completed No   Shingrix Completed?: No.    Education has been provided regarding the importance of this vaccine. Patient has been advised to call insurance company to determine out of pocket expense if they have not yet received this vaccine. Advised may also receive vaccine at local pharmacy or Health Dept. Verbalized acceptance and understanding.  Screening Tests Health Maintenance  Topic Date Due   DEXA SCAN  Never done   OPHTHALMOLOGY EXAM  10/30/2022   INFLUENZA VACCINE  04/04/2023 (Originally 08/05/2022)   Zoster Vaccines- Shingrix  (1 of 2) 04/07/2023 (Originally 09/10/1990)   DTaP/Tdap/Td (1 - Tdap) 11/03/2023 (Originally 09/10/1959)   Pneumonia Vaccine 84+ Years old (1 of 2 - PCV) 11/03/2023 (Originally 09/10/1946)   HEMOGLOBIN A1C  05/04/2023   Diabetic kidney evaluation - Urine ACR  07/05/2023   FOOT EXAM  11/03/2023   Diabetic kidney evaluation - eGFR measurement  12/06/2023   Medicare Annual Wellness (AWV)  12/07/2023   HPV VACCINES  Aged Out   COVID-19 Vaccine  Discontinued    Health Maintenance  Health Maintenance Due  Topic Date Due   DEXA SCAN  Never done   OPHTHALMOLOGY EXAM  10/30/2022    Colorectal cancer screening: No longer required.   Mammogram status: No longer required due to age and preference.  Bone Density status:  Declines at this time   Lung Cancer Screening: (Low Dose CT Chest recommended if Age 44-80 years, 20 pack-year currently smoking OR have quit w/in 15years.) does not qualify.   Lung Cancer Screening Referral: n/a  Additional Screening:  Hepatitis C Screening: does not qualify  Vision Screening: Recommended annual ophthalmology exams for early detection of glaucoma and other disorders of the eye. Is the patient up to date with their annual eye exam?  Yes  Who is the provider or what is the name of the office in which  the patient attends annual eye exams? Dr. Smitty Cords If pt is not established with a provider, would they like to be referred to a provider to establish care? No .   Dental Screening: Recommended annual dental exams for proper oral hygiene  Diabetic Foot Exam: Diabetic Foot Exam: Completed 11/03/22  Community Resource Referral / Chronic Care Management: CRR required this visit?  No   CCM required this visit?  No     Plan:     I have personally reviewed and noted the following in the patient's chart:   Medical and social history Use of alcohol, tobacco or illicit drugs  Current medications and supplements including opioid prescriptions. Patient is  not currently taking opioid prescriptions. Functional ability and status Nutritional status Physical activity Advanced directives List of other physicians Hospitalizations, surgeries, and ER visits in previous 12 months Vitals Screenings to include cognitive, depression, and falls Referrals and appointments  In addition, I have reviewed and discussed with patient certain preventive protocols, quality metrics, and best practice recommendations. A written personalized care plan for preventive services as well as general preventive health recommendations were provided to patient.     Kandis Fantasia [place], [place]   [id number]/06/[id number]   After Visit Summary: (MyChart) Due to this being a telephonic visit, the after visit summary with patients personalized plan was offered to patient via MyChart   Nurse Notes: No concerns at this time

## 2022-12-07 NOTE — Patient Instructions (Signed)
Ms. Heather Oconnor , Thank you for taking time to come for your Medicare Wellness Visit. I appreciate your ongoing commitment to your health goals. Please review the following plan we discussed and let me know if I can assist you in the future.   Referrals/Orders/Follow-Ups/Clinician Recommendations: Aim for 30 minutes of exercise or brisk walking, 6-8 glasses of water, and 5 servings of fruits and vegetables each day.  This is a list of the screening recommended for you and due dates:  Health Maintenance  Topic Date Due   DEXA scan (bone density measurement)  Never done   Eye exam for diabetics  10/30/2022   Flu Shot  04/04/2023*   Zoster (Shingles) Vaccine (1 of 2) 04/07/2023*   DTaP/Tdap/Td vaccine (1 - Tdap) 11/03/2023*   Pneumonia Vaccine (1 of 2 - PCV) 11/03/2023*   Hemoglobin A1C  05/04/2023   Yearly kidney health urinalysis for diabetes  07/05/2023   Complete foot exam   11/03/2023   Yearly kidney function blood test for diabetes  12/06/2023   Medicare Annual Wellness Visit  12/07/2023   HPV Vaccine  Aged Out   COVID-19 Vaccine  Discontinued  *Topic was postponed. The date shown is not the original due date.    Advanced directives: (ACP Link)Information on Advanced Care Planning can be found at [facility] [place] Advance Health Care Directives Advance Health Care Directives ([email or web address]   Next Medicare Annual Wellness Visit scheduled for next year: Yes

## 2022-12-23 DIAGNOSIS — H401113 Primary open-angle glaucoma, right eye, severe stage: Secondary | ICD-10-CM | POA: Diagnosis not present

## 2022-12-23 DIAGNOSIS — H401121 Primary open-angle glaucoma, left eye, mild stage: Secondary | ICD-10-CM | POA: Diagnosis not present

## 2022-12-24 ENCOUNTER — Other Ambulatory Visit: Payer: Self-pay | Admitting: Family Medicine

## 2022-12-24 DIAGNOSIS — E1165 Type 2 diabetes mellitus with hyperglycemia: Secondary | ICD-10-CM

## 2022-12-24 DIAGNOSIS — I5032 Chronic diastolic (congestive) heart failure: Secondary | ICD-10-CM

## 2022-12-24 DIAGNOSIS — Z8673 Personal history of transient ischemic attack (TIA), and cerebral infarction without residual deficits: Secondary | ICD-10-CM

## 2022-12-24 DIAGNOSIS — K2100 Gastro-esophageal reflux disease with esophagitis, without bleeding: Secondary | ICD-10-CM

## 2022-12-24 DIAGNOSIS — I10 Essential (primary) hypertension: Secondary | ICD-10-CM

## 2022-12-27 ENCOUNTER — Other Ambulatory Visit: Payer: Self-pay | Admitting: Family Medicine

## 2022-12-27 DIAGNOSIS — G479 Sleep disorder, unspecified: Secondary | ICD-10-CM

## 2022-12-27 DIAGNOSIS — F321 Major depressive disorder, single episode, moderate: Secondary | ICD-10-CM

## 2022-12-30 ENCOUNTER — Encounter: Payer: Self-pay | Admitting: Family Medicine

## 2022-12-30 ENCOUNTER — Ambulatory Visit (HOSPITAL_COMMUNITY)
Admission: RE | Admit: 2022-12-30 | Discharge: 2022-12-30 | Disposition: A | Discharge status: Home / Self Care | Payer: Medicare HMO | Source: Ambulatory Visit | Attending: Family Medicine | Admitting: Family Medicine

## 2022-12-30 ENCOUNTER — Ambulatory Visit (INDEPENDENT_AMBULATORY_CARE_PROVIDER_SITE_OTHER): Payer: Medicare HMO | Admitting: Family Medicine

## 2022-12-30 VITALS — BP 190/77 | HR 73 | Temp 97.8°F | Ht 63.0 in | Wt 136.0 lb

## 2022-12-30 DIAGNOSIS — R160 Hepatomegaly, not elsewhere classified: Secondary | ICD-10-CM | POA: Diagnosis not present

## 2022-12-30 DIAGNOSIS — K828 Other specified diseases of gallbladder: Secondary | ICD-10-CM | POA: Diagnosis not present

## 2022-12-30 DIAGNOSIS — R112 Nausea with vomiting, unspecified: Secondary | ICD-10-CM | POA: Insufficient documentation

## 2022-12-30 DIAGNOSIS — K8020 Calculus of gallbladder without cholecystitis without obstruction: Secondary | ICD-10-CM | POA: Diagnosis not present

## 2022-12-30 DIAGNOSIS — R1011 Right upper quadrant pain: Secondary | ICD-10-CM | POA: Insufficient documentation

## 2022-12-30 DIAGNOSIS — K838 Other specified diseases of biliary tract: Secondary | ICD-10-CM | POA: Diagnosis not present

## 2022-12-30 MED ORDER — ONDANSETRON HCL 4 MG PO TABS: 4.0000 mg | ORAL_TABLET | Freq: Three times a day (TID) | ORAL | 0 refills | Status: DC | PRN

## 2022-12-30 NOTE — Progress Notes (Addendum)
Subjective:  Patient ID: Heather Oconnor, female    DOB: 08-03-1940, 82 y.o.   MRN: [id number]  Patient Care Team: Gabriel Earing, FNP as PCP - General (Family Medicine) Rollene Rotunda, MD as PCP - Cardiology (Cardiology) Jena Gauss Gerrit Friends, MD as Consulting Physician (Gastroenterology) Bond, Doran Stabler, MD as Consulting Physician (Ophthalmology) Smitty Cords, OD (Optometry)   Chief Complaint:  Nausea (3 weeks it has been going on after she eats and when she looks at food)   HPI: Heather Oconnor is a 82 y.o. female presenting on 12/30/2022 for Nausea (3 weeks it has been going on after she eats and when she looks at food)   Discussed the use of AI scribe software for clinical note transcription with the patient, who gave verbal consent to proceed.  History of Present Illness   Heather Oconnor, a patient with a history of acid reflux, presents with a three-week history of persistent nausea and anorexia. She reports a sensation of sickness in the stomach after eating, which has led to a significant decrease in food intake. The nausea is so severe that even the sight of food can trigger it. Despite the persistent nausea, she denies any episodes of vomiting.  She also reports a persistent dry mouth and an increased thirst for cold beverages. She has been managing this by consuming cold drinks and ice. She also mentions a soreness in the stomach, which is generalized and not localized to any specific area. There are no associated symptoms of fever, chills, or changes in bowel habits.  She has been able to tolerate liquid foods such as tomato soup, but struggles with solid foods. She denies any specific food exacerbating the symptoms. She also reports taking pantoprazole for acid reflux, but it is unclear if this medication has been effective in managing the current symptoms.  In addition to the above, she has been experiencing bowel leakage and loose stools, for which she has been  taking Imodium. Despite this, she denies any changes in bowel habits.  She has not previously discussed these symptoms with her GI doctor. Her symptoms have progressively worsened over the past three weeks, and she has not found any relief from her current medications.          Relevant past medical, surgical, family, and social history reviewed and updated as indicated.  Allergies and medications reviewed and updated. Data reviewed: Chart in Epic.   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   Qualifier: Diagnosis of  By: Diana Eves     Pancreatitis    Transient cerebral ischemia 05/08/2008   Qualifier: Diagnosis of  By: Diana Eves      Past Surgical History:  Procedure Laterality Date   COLONOSCOPY  09/07/2007   RMR: anal canal/external hemorrhoids, redundant colon, left-sided diverticula, otherwise normal colonic mucosa    Social History   Socioeconomic History   Marital status: Widowed    Spouse name: Not on file   Number of children: 3   Years of education: Not on file   Highest education level: Not on file  Occupational History   Occupation: retired  Tobacco Use   Smoking status: Former  Current packs/day: 0.00    Types: Cigarettes    Start date: 11/26/1997    Quit date: 11/27/2006    Years since quitting: 16.1   Smokeless tobacco: Never  Vaping Use   Vaping status: Never Used  Substance and Sexual Activity   Alcohol use: No    Alcohol/week: 0.0 standard drinks of alcohol   Drug use: No   Sexual activity: Not Currently  Other Topics Concern   Not on file  Social History Narrative   Three children.  All live nearby.   Social Drivers of Corporate investment banker Strain: Low Risk  (12/07/2022)    Overall Financial Resource Strain (CARDIA)    Difficulty of Paying Living Expenses: Not hard at all  Food Insecurity: No Food Insecurity (12/07/2022)   Hunger Vital Sign    Worried About Running Out of Food in the Last Year: Never true    Ran Out of Food in the Last Year: Never true  Transportation Needs: No Transportation Needs (12/07/2022)   PRAPARE - Administrator, Civil Service (Medical): No    Lack of Transportation (Non-Medical): No  Physical Activity: Insufficiently Active (12/07/2022)   Exercise Vital Sign    Days of Exercise per Week: 3 days    Minutes of Exercise per Session: 30 min  Stress: No Stress Concern Present (12/07/2022)   [employer] of Occupational Health - Occupational Stress Questionnaire    Feeling of Stress : Not at all  Social Connections: Moderately Isolated (12/07/2022)   Social Connection and Isolation Panel [NHANES]    Frequency of Communication with Friends and Family: More than three times a week    Frequency of Social Gatherings with Friends and Family: Three times a week    Attends Religious Services: More than 4 times per year    Active Member of Clubs or Organizations: No    Attends Banker Meetings: Never    Marital Status: Widowed  Intimate Partner Violence: Not At Risk (12/07/2022)   Humiliation, Afraid, Rape, and Kick questionnaire    Fear of Current or Ex-Partner: No    Emotionally Abused: No    Physically Abused: No    Sexually Abused: No    Outpatient Encounter Medications as of 12/30/2022  Medication Sig   amLODipine (NORVASC) 10 MG tablet TAKE 1 TABLET EVERY DAY   aspirin EC 81 MG tablet Take 81 mg by mouth daily. Swallow whole.   atorvastatin (LIPITOR) 40 MG tablet TAKE 1 TABLET EVERY DAY   bisoprolol (ZEBETA) 5 MG tablet TAKE 1 AND 1/2 TABLETS EVERY DAY   cloNIDine (CATAPRES) 0.2 MG tablet TAKE 1 TABLET TWICE DAILY   clopidogrel (PLAVIX) 75 MG tablet TAKE 1 TABLET EVERY DAY   dorzolamide-timolol  (COSOPT) 22.3-6.8 MG/ML ophthalmic solution Place 1 drop into both eyes 2 (two) times daily.   hydrALAZINE (APRESOLINE) 100 MG tablet TAKE 1 TABLET THREE TIMES DAILY   metFORMIN (GLUCOPHAGE) 500 MG tablet TAKE 2 TABLETS TWICE DAILY WITH MEALS   mirtazapine (REMERON) 15 MG tablet TAKE 1 TABLET AT BEDTIME   ondansetron (ZOFRAN) 4 MG tablet Take 1 tablet (4 mg total) by mouth every 8 (eight) hours as needed for nausea or vomiting.   pantoprazole (PROTONIX) 40 MG tablet TAKE 1 TABLET EVERY DAY   spironolactone (ALDACTONE) 25 MG tablet Take 1 tablet (25 mg total) by mouth daily.   TRUE METRIX BLOOD GLUCOSE TEST test strip 1 each by Other route 4 (four) times daily as needed.  TRUEplus Lancets 33G MISC Test BS up to four times daily as needed Dx E11.9   valsartan (DIOVAN) 320 MG tablet Take 1 tablet (320 mg total) by mouth daily.   vitamin B-12 (CYANOCOBALAMIN) 1000 MCG tablet Take 1,000 mcg by mouth daily.   Vitamin D, Ergocalciferol, (DRISDOL) 1.25 MG (50000 UNIT) CAPS capsule Take 1 capsule (50,000 Units total) by mouth every 7 (seven) days.   No facility-administered encounter medications on file as of 12/30/2022.    Allergies  Allergen Reactions   Hctz [Hydrochlorothiazide]     Per GI may have caused pancreatitis. Dr. Christella Hartigan recommended never resuming.   Lisinopril     H/O pancreatitis.    Pertinent ROS per HPI, otherwise unremarkable      Objective:  BP (!) 190/77   Pulse 73   Temp 97.8 F (36.6 C) (Temporal)   Ht 5\' 3"  (1.6 m)   Wt 136 lb (61.7 kg)   SpO2 94%   BMI 24.09 kg/m    Wt Readings from Last 3 Encounters:  12/30/22 136 lb (61.7 kg)  12/07/22 142 lb (64.4 kg)  11/03/22 142 lb 4 oz (64.5 kg)    Physical Exam Vitals and nursing note reviewed.  Constitutional:      General: She is not in acute distress.    Appearance: Normal appearance. She is well-developed and well-groomed. She is obese. She is not ill-appearing, toxic-appearing or diaphoretic.  HENT:      Head: Normocephalic and atraumatic.     Jaw: There is normal jaw occlusion.     Right Ear: Hearing normal.     Left Ear: Hearing normal.     Nose: Nose normal.     Mouth/Throat:     Lips: Pink.     Mouth: Mucous membranes are moist.     Pharynx: Oropharynx is clear. Uvula midline.  Eyes:     General: Lids are normal.     Extraocular Movements: Extraocular movements intact.     Conjunctiva/sclera: Conjunctivae normal.     Pupils: Pupils are equal, round, and reactive to light.  Neck:     Thyroid: No thyroid mass, thyromegaly or thyroid tenderness.     Vascular: No carotid bruit or JVD.     Trachea: Trachea and phonation normal.  Cardiovascular:     Rate and Rhythm: Normal rate and regular rhythm.     Chest Wall: PMI is not displaced.     Pulses: Normal pulses.     Heart sounds: Normal heart sounds. No murmur heard.    No friction rub. No gallop.  Pulmonary:     Effort: Pulmonary effort is normal. No respiratory distress.     Breath sounds: Normal breath sounds. No wheezing.  Abdominal:     General: Bowel sounds are normal. There is no distension or abdominal bruit.     Palpations: Abdomen is soft. There is no hepatomegaly or splenomegaly.     Tenderness: There is abdominal tenderness in the right upper quadrant. There is no right CVA tenderness or left CVA tenderness. Positive signs include Murphy's sign.     Hernia: No hernia is present.  Musculoskeletal:        General: Normal range of motion.     Cervical back: Normal range of motion and neck supple.     Right lower leg: No edema.     Left lower leg: No edema.  Lymphadenopathy:     Cervical: No cervical adenopathy.  Skin:    General: Skin is warm and  dry.     Capillary Refill: Capillary refill takes less than 2 seconds.     Coloration: Skin is not cyanotic, jaundiced or pale.     Findings: No rash.  Neurological:     General: No focal deficit present.     Mental Status: She is alert and oriented to person, place, and  time.     Sensory: Sensation is intact.     Motor: Motor function is intact.     Coordination: Coordination is intact.     Gait: Gait is intact.     Deep Tendon Reflexes: Reflexes are normal and symmetric.  Psychiatric:        Attention and Perception: Attention and perception normal.        Mood and Affect: Mood and affect normal.        Speech: Speech normal.        Behavior: Behavior normal. Behavior is cooperative.        Thought Content: Thought content normal.        Cognition and Memory: Cognition and memory normal.        Judgment: Judgment normal.     Results for orders placed or performed in visit on 12/08/22  HM DIABETES EYE EXAM   Collection Time: 03/22/22 12:00 AM  Result Value Ref Range   HM Diabetic Eye Exam No Retinopathy No Retinopathy       Pertinent labs & imaging results that were available during my care of the patient were reviewed by me and considered in my medical decision making.  Assessment & Plan:  Braden was seen today for nausea.  Diagnoses and all orders for this visit:  Nausea and vomiting in adult patient -     US Abdomen Limited RUQ (LIVER/GB); Future -     CBC with Differential/Platelet -     CMP14+EGFR -     Amylase -     Lipase -     ondansetron (ZOFRAN) 4 MG tablet; Take 1 tablet (4 mg total) by mouth every 8 (eight) hours as needed for nausea or vomiting.  RUQ pain -     US Abdomen Limited RUQ (LIVER/GB); Future -     CBC with Differential/Platelet -     CMP14+EGFR -     Amylase -     Lipase     Assessment and Plan    Nausea and Vomiting Nausea for the past three weeks, worsened by food intake. No vomiting, fever, chills, significant abdominal pain, changes in bowel habits, or blood in stool. Differential includes gastritis or gallbladder disease. Discussed Zofran for nausea, compatible with current medications and EKG. - Prescribe Zofran, every 8-10 hours as needed  Possible Gallbladder Disease Right upper quadrant  tenderness. Differential includes cholecystitis or gallstones. Management depends on ultrasound results and symptoms, potentially requiring surgery or dietary changes. - Order gallbladder ultrasound - Order lab work including amylase and lipase  Acid Reflux Managed with pantoprazole (Protonix). Discussed importance of adherence to regimen. - Ensure adherence to pantoprazole regimen  Follow-up - Complete blood work today - Schedule gallbladder ultrasound - Follow up with ultrasound and lab results to determine next steps.          Continue all other maintenance medications.  Follow up plan: Return if symptoms worsen or fail to improve.   Continue healthy lifestyle choices, including diet (rich in fruits, vegetables, and lean proteins, and low in salt and simple carbohydrates) and exercise (at least 30 minutes of moderate physical activity daily).  Educational handout given for nausea   The above assessment and management plan was discussed with the patient. The patient verbalized understanding of and has agreed to the management plan. Patient is aware to call the clinic if they develop any new symptoms or if symptoms persist or worsen. Patient is aware when to return to the clinic for a follow-up visit. Patient educated on when it is appropriate to go to the emergency department.   Kari Baars, FNP-C [facility] [phone]   US revealed gallstones and liver masses, urgent referral placed to see Dr. Henreitta Leber. MRCP ordered.

## 2022-12-30 NOTE — Addendum Note (Signed)
Addended by: Sonny Masters on: 12/30/2022 12:14 PM   Modules accepted: Orders

## 2022-12-31 ENCOUNTER — Telehealth: Payer: Self-pay

## 2022-12-31 LAB — CMP14+EGFR
ALT: 7 [IU]/L (ref 0–32)
AST: 43 [IU]/L — ABNORMAL HIGH (ref 0–40)
Albumin: 3.7 g/dL (ref 3.7–4.7)
Alkaline Phosphatase: 156 [IU]/L — ABNORMAL HIGH (ref 44–121)
BUN/Creatinine Ratio: 16 (ref 12–28)
BUN: 15 mg/dL (ref 8–27)
Bilirubin Total: 0.7 mg/dL (ref 0.0–1.2)
CO2: 24 mmol/L (ref 20–29)
Calcium: 8.9 mg/dL (ref 8.7–10.3)
Chloride: 99 mmol/L (ref 96–106)
Creatinine, Ser: 0.96 mg/dL (ref 0.57–1.00)
Globulin, Total: 2.4 g/dL (ref 1.5–4.5)
Glucose: 116 mg/dL — ABNORMAL HIGH (ref 70–99)
Potassium: 4.8 mmol/L (ref 3.5–5.2)
Sodium: 141 mmol/L (ref 134–144)
Total Protein: 6.1 g/dL (ref 6.0–8.5)
eGFR: 59 mL/min/{1.73_m2} — ABNORMAL LOW (ref >59)

## 2022-12-31 LAB — AMYLASE: Amylase: 32 U/L (ref 31–110)

## 2022-12-31 LAB — CBC WITH DIFFERENTIAL/PLATELET
Basophils Absolute: 0.1 10*3/uL (ref 0.0–0.2)
Basos: 1 %
EOS (ABSOLUTE): 0.1 10*3/uL (ref 0.0–0.4)
Eos: 1 %
Hematocrit: 38.5 % (ref 34.0–46.6)
Hemoglobin: 12.2 g/dL (ref 11.1–15.9)
Immature Grans (Abs): 0 10*3/uL (ref 0.0–0.1)
Immature Granulocytes: 0 %
Lymphocytes Absolute: 1.2 10*3/uL (ref 0.7–3.1)
Lymphs: 10 %
MCH: 27.9 pg (ref 26.6–33.0)
MCHC: 31.7 g/dL (ref 31.5–35.7)
MCV: 88 fL (ref 79–97)
Monocytes Absolute: 1.2 10*3/uL — ABNORMAL HIGH (ref 0.1–0.9)
Monocytes: 9 %
Neutrophils Absolute: 9.8 10*3/uL — ABNORMAL HIGH (ref 1.4–7.0)
Neutrophils: 79 %
Platelets: 324 10*3/uL (ref 150–450)
RBC: 4.37 x10E6/uL (ref 3.77–5.28)
RDW: 13.7 % (ref 11.7–15.4)
WBC: 12.3 10*3/uL — ABNORMAL HIGH (ref 3.4–10.8)

## 2022-12-31 LAB — LIPASE: Lipase: 30 U/L (ref 14–85)

## 2022-12-31 NOTE — Telephone Encounter (Signed)
Copied from CRM [id number]. Topic: Clinical - Lab/Test Results >> Dec 30, 2022  5:26 PM Shelah Lewandowsky wrote: Reason for CRM: [employer]. With [facility] looking from any previous MRI results - tracking number [id number]  phone number [phone]

## 2022-12-31 NOTE — Telephone Encounter (Signed)
No MRI results found in medical records

## 2022-12-31 NOTE — Telephone Encounter (Signed)
Please advise on medical records

## 2023-01-03 ENCOUNTER — Emergency Department (HOSPITAL_COMMUNITY): Payer: Medicare HMO

## 2023-01-03 ENCOUNTER — Other Ambulatory Visit: Payer: Self-pay

## 2023-01-03 ENCOUNTER — Encounter (HOSPITAL_COMMUNITY): Payer: Self-pay

## 2023-01-03 ENCOUNTER — Inpatient Hospital Stay (HOSPITAL_COMMUNITY)
Admission: EM | Admit: 2023-01-03 | Discharge: 2023-01-06 | DRG: 435 | Disposition: A | Discharge status: Home Health | Payer: Medicare HMO | Attending: Family Medicine | Admitting: Family Medicine

## 2023-01-03 DIAGNOSIS — J449 Chronic obstructive pulmonary disease, unspecified: Secondary | ICD-10-CM | POA: Diagnosis present

## 2023-01-03 DIAGNOSIS — Z7984 Long term (current) use of oral hypoglycemic drugs: Secondary | ICD-10-CM | POA: Diagnosis not present

## 2023-01-03 DIAGNOSIS — C220 Liver cell carcinoma: Principal | ICD-10-CM | POA: Diagnosis present

## 2023-01-03 DIAGNOSIS — K8020 Calculus of gallbladder without cholecystitis without obstruction: Secondary | ICD-10-CM | POA: Diagnosis not present

## 2023-01-03 DIAGNOSIS — F32A Depression, unspecified: Secondary | ICD-10-CM | POA: Diagnosis not present

## 2023-01-03 DIAGNOSIS — R911 Solitary pulmonary nodule: Secondary | ICD-10-CM | POA: Diagnosis present

## 2023-01-03 DIAGNOSIS — K8010 Calculus of gallbladder with chronic cholecystitis without obstruction: Secondary | ICD-10-CM | POA: Diagnosis not present

## 2023-01-03 DIAGNOSIS — I10 Essential (primary) hypertension: Secondary | ICD-10-CM | POA: Diagnosis not present

## 2023-01-03 DIAGNOSIS — I5032 Chronic diastolic (congestive) heart failure: Secondary | ICD-10-CM | POA: Diagnosis present

## 2023-01-03 DIAGNOSIS — R160 Hepatomegaly, not elsewhere classified: Secondary | ICD-10-CM

## 2023-01-03 DIAGNOSIS — Z8719 Personal history of other diseases of the digestive system: Secondary | ICD-10-CM | POA: Diagnosis not present

## 2023-01-03 DIAGNOSIS — R591 Generalized enlarged lymph nodes: Secondary | ICD-10-CM | POA: Diagnosis present

## 2023-01-03 DIAGNOSIS — Z8673 Personal history of transient ischemic attack (TIA), and cerebral infarction without residual deficits: Secondary | ICD-10-CM

## 2023-01-03 DIAGNOSIS — R630 Anorexia: Secondary | ICD-10-CM

## 2023-01-03 DIAGNOSIS — K769 Liver disease, unspecified: Secondary | ICD-10-CM | POA: Insufficient documentation

## 2023-01-03 DIAGNOSIS — K219 Gastro-esophageal reflux disease without esophagitis: Secondary | ICD-10-CM | POA: Diagnosis not present

## 2023-01-03 DIAGNOSIS — K8050 Calculus of bile duct without cholangitis or cholecystitis without obstruction: Secondary | ICD-10-CM | POA: Diagnosis not present

## 2023-01-03 DIAGNOSIS — Z7902 Long term (current) use of antithrombotics/antiplatelets: Secondary | ICD-10-CM

## 2023-01-03 DIAGNOSIS — R932 Abnormal findings on diagnostic imaging of liver and biliary tract: Secondary | ICD-10-CM

## 2023-01-03 DIAGNOSIS — R634 Abnormal weight loss: Secondary | ICD-10-CM | POA: Diagnosis not present

## 2023-01-03 DIAGNOSIS — Z8249 Family history of ischemic heart disease and other diseases of the circulatory system: Secondary | ICD-10-CM

## 2023-01-03 DIAGNOSIS — K828 Other specified diseases of gallbladder: Secondary | ICD-10-CM | POA: Diagnosis not present

## 2023-01-03 DIAGNOSIS — K838 Other specified diseases of biliary tract: Secondary | ICD-10-CM | POA: Diagnosis not present

## 2023-01-03 DIAGNOSIS — K831 Obstruction of bile duct: Secondary | ICD-10-CM | POA: Diagnosis not present

## 2023-01-03 DIAGNOSIS — K810 Acute cholecystitis: Secondary | ICD-10-CM | POA: Diagnosis not present

## 2023-01-03 DIAGNOSIS — K7460 Unspecified cirrhosis of liver: Secondary | ICD-10-CM | POA: Diagnosis not present

## 2023-01-03 DIAGNOSIS — I81 Portal vein thrombosis: Secondary | ICD-10-CM | POA: Diagnosis not present

## 2023-01-03 DIAGNOSIS — Z79899 Other long term (current) drug therapy: Secondary | ICD-10-CM | POA: Diagnosis not present

## 2023-01-03 DIAGNOSIS — R159 Full incontinence of feces: Secondary | ICD-10-CM | POA: Diagnosis present

## 2023-01-03 DIAGNOSIS — Z515 Encounter for palliative care: Secondary | ICD-10-CM | POA: Diagnosis not present

## 2023-01-03 DIAGNOSIS — R140 Abdominal distension (gaseous): Secondary | ICD-10-CM | POA: Diagnosis not present

## 2023-01-03 DIAGNOSIS — E43 Unspecified severe protein-calorie malnutrition: Secondary | ICD-10-CM | POA: Diagnosis not present

## 2023-01-03 DIAGNOSIS — I110 Hypertensive heart disease with heart failure: Secondary | ICD-10-CM | POA: Diagnosis present

## 2023-01-03 DIAGNOSIS — D649 Anemia, unspecified: Secondary | ICD-10-CM | POA: Diagnosis present

## 2023-01-03 DIAGNOSIS — K819 Cholecystitis, unspecified: Secondary | ICD-10-CM | POA: Diagnosis present

## 2023-01-03 DIAGNOSIS — K589 Irritable bowel syndrome without diarrhea: Secondary | ICD-10-CM | POA: Diagnosis present

## 2023-01-03 DIAGNOSIS — H5461 Unqualified visual loss, right eye, normal vision left eye: Secondary | ICD-10-CM | POA: Diagnosis present

## 2023-01-03 DIAGNOSIS — J439 Emphysema, unspecified: Secondary | ICD-10-CM | POA: Diagnosis not present

## 2023-01-03 DIAGNOSIS — Z87891 Personal history of nicotine dependence: Secondary | ICD-10-CM

## 2023-01-03 DIAGNOSIS — J90 Pleural effusion, not elsewhere classified: Secondary | ICD-10-CM | POA: Diagnosis not present

## 2023-01-03 DIAGNOSIS — E785 Hyperlipidemia, unspecified: Secondary | ICD-10-CM | POA: Diagnosis present

## 2023-01-03 DIAGNOSIS — R945 Abnormal results of liver function studies: Secondary | ICD-10-CM | POA: Diagnosis not present

## 2023-01-03 DIAGNOSIS — Z888 Allergy status to other drugs, medicaments and biological substances status: Secondary | ICD-10-CM

## 2023-01-03 DIAGNOSIS — Z66 Do not resuscitate: Secondary | ICD-10-CM | POA: Diagnosis present

## 2023-01-03 DIAGNOSIS — Z7189 Other specified counseling: Secondary | ICD-10-CM | POA: Diagnosis not present

## 2023-01-03 DIAGNOSIS — C787 Secondary malignant neoplasm of liver and intrahepatic bile duct: Secondary | ICD-10-CM | POA: Diagnosis not present

## 2023-01-03 DIAGNOSIS — Z6824 Body mass index (BMI) 24.0-24.9, adult: Secondary | ICD-10-CM

## 2023-01-03 DIAGNOSIS — R7401 Elevation of levels of liver transaminase levels: Secondary | ICD-10-CM | POA: Insufficient documentation

## 2023-01-03 LAB — COMPREHENSIVE METABOLIC PANEL
ALT: 13 U/L (ref 0–44)
AST: 69 U/L — ABNORMAL HIGH (ref 15–41)
Albumin: 2.6 g/dL — ABNORMAL LOW (ref 3.5–5.0)
Alkaline Phosphatase: 109 U/L (ref 38–126)
Anion gap: 18 — ABNORMAL HIGH (ref 5–15)
BUN: 23 mg/dL (ref 8–23)
CO2: 22 mmol/L (ref 22–32)
Calcium: 8.7 mg/dL — ABNORMAL LOW (ref 8.9–10.3)
Chloride: 99 mmol/L (ref 98–111)
Creatinine, Ser: 0.97 mg/dL (ref 0.44–1.00)
GFR, Estimated: 58 mL/min — ABNORMAL LOW (ref >60)
Glucose, Bld: 131 mg/dL — ABNORMAL HIGH (ref 70–99)
Potassium: 4.5 mmol/L (ref 3.5–5.1)
Sodium: 139 mmol/L (ref 135–145)
Total Bilirubin: 1.4 mg/dL — ABNORMAL HIGH (ref <1.2)
Total Protein: 6.1 g/dL — ABNORMAL LOW (ref 6.5–8.1)

## 2023-01-03 LAB — CBC
HCT: 36.5 % (ref 36.0–46.0)
Hemoglobin: 10.8 g/dL — ABNORMAL LOW (ref 12.0–15.0)
MCH: 27.1 pg (ref 26.0–34.0)
MCHC: 29.6 g/dL — ABNORMAL LOW (ref 30.0–36.0)
MCV: 91.5 fL (ref 80.0–100.0)
Platelets: 312 10*3/uL (ref 150–400)
RBC: 3.99 MIL/uL (ref 3.87–5.11)
RDW: 15.5 % (ref 11.5–15.5)
WBC: 13.8 10*3/uL — ABNORMAL HIGH (ref 4.0–10.5)
nRBC: 0 % (ref 0.0–0.2)

## 2023-01-03 LAB — CBG MONITORING, ED: Glucose-Capillary: 119 mg/dL — ABNORMAL HIGH (ref 70–99)

## 2023-01-03 LAB — MAGNESIUM: Magnesium: 1.3 mg/dL — ABNORMAL LOW (ref 1.7–2.4)

## 2023-01-03 LAB — LACTIC ACID, PLASMA
Lactic Acid, Venous: 1.2 mmol/L (ref 0.5–1.9)
Lactic Acid, Venous: 1.1 mmol/L (ref 0.5–1.9)

## 2023-01-03 LAB — LIPASE, BLOOD: Lipase: 22 U/L (ref 11–51)

## 2023-01-03 MED ORDER — DORZOLAMIDE HCL-TIMOLOL MAL 2-0.5 % OP SOLN
1.0000 [drp] | Freq: Two times a day (BID) | OPHTHALMIC | Status: DC
Start: 2023-01-03 — End: 2023-01-06
  Administered 2023-01-04 – 2023-01-06 (×5): 1 [drp] via OPHTHALMIC
  Filled 2023-01-03: qty 10

## 2023-01-03 MED ORDER — PIPERACILLIN-TAZOBACTAM 3.375 G IVPB
3.3750 g | Freq: Three times a day (TID) | INTRAVENOUS | Status: DC
  Administered 2023-01-03 – 2023-01-04 (×2): 3.375 g via INTRAVENOUS
  Filled 2023-01-03 (×2): qty 50

## 2023-01-03 MED ORDER — POLYETHYLENE GLYCOL 3350 17 G PO PACK: 17.0000 g | PACK | Freq: Every day | ORAL | Status: DC | PRN

## 2023-01-03 MED ORDER — AMLODIPINE BESYLATE 10 MG PO TABS
10.0000 mg | ORAL_TABLET | Freq: Every day | ORAL | Status: DC
  Administered 2023-01-03 – 2023-01-06 (×4): 10 mg via ORAL
  Filled 2023-01-03 (×4): qty 1

## 2023-01-03 MED ORDER — HEPARIN SODIUM (PORCINE) 5000 UNIT/ML IJ SOLN
5000.0000 [IU] | Freq: Three times a day (TID) | INTRAMUSCULAR | Status: DC
  Administered 2023-01-03 – 2023-01-04 (×2): 5000 [IU] via SUBCUTANEOUS
  Filled 2023-01-03 (×2): qty 1

## 2023-01-03 MED ORDER — CLONIDINE HCL 0.2 MG PO TABS
0.2000 mg | ORAL_TABLET | Freq: Two times a day (BID) | ORAL | Status: DC
  Administered 2023-01-03 – 2023-01-06 (×6): 0.2 mg via ORAL
  Filled 2023-01-03 (×6): qty 1

## 2023-01-03 MED ORDER — MAGNESIUM SULFATE 2 GM/50ML IV SOLN
2.0000 g | Freq: Once | INTRAVENOUS | Status: AC
  Administered 2023-01-03: 2 g via INTRAVENOUS
  Filled 2023-01-03: qty 50

## 2023-01-03 MED ORDER — ACETAMINOPHEN 650 MG RE SUPP: 650.0000 mg | Freq: Four times a day (QID) | RECTAL | Status: DC | PRN

## 2023-01-03 MED ORDER — ACETAMINOPHEN 325 MG PO TABS
650.0000 mg | ORAL_TABLET | Freq: Four times a day (QID) | ORAL | Status: DC | PRN
  Administered 2023-01-03: 650 mg via ORAL
  Filled 2023-01-03 (×2): qty 2

## 2023-01-03 MED ORDER — GADOBUTROL 1 MMOL/ML IV SOLN
7.0000 mL | Freq: Once | INTRAVENOUS | Status: AC | PRN
  Administered 2023-01-03: 7 mL via INTRAVENOUS

## 2023-01-03 MED ORDER — SODIUM CHLORIDE 0.9 % IV BOLUS
1000.0000 mL | Freq: Once | INTRAVENOUS | Status: AC
  Administered 2023-01-03: 1000 mL via INTRAVENOUS

## 2023-01-03 MED ORDER — PIPERACILLIN-TAZOBACTAM 3.375 G IVPB 30 MIN
3.3750 g | Freq: Once | INTRAVENOUS | Status: AC
  Administered 2023-01-03: 3.375 g via INTRAVENOUS
  Filled 2023-01-03: qty 50

## 2023-01-03 MED ORDER — ATORVASTATIN CALCIUM 40 MG PO TABS
40.0000 mg | ORAL_TABLET | Freq: Every day | ORAL | Status: DC
  Administered 2023-01-03 – 2023-01-06 (×4): 40 mg via ORAL
  Filled 2023-01-03 (×4): qty 1

## 2023-01-03 MED ORDER — PANTOPRAZOLE SODIUM 40 MG PO TBEC
40.0000 mg | DELAYED_RELEASE_TABLET | Freq: Every day | ORAL | Status: DC
  Administered 2023-01-03 – 2023-01-06 (×4): 40 mg via ORAL
  Filled 2023-01-03 (×4): qty 1

## 2023-01-03 MED ORDER — HYDRALAZINE HCL 50 MG PO TABS
100.0000 mg | ORAL_TABLET | Freq: Three times a day (TID) | ORAL | Status: DC
  Administered 2023-01-03 – 2023-01-06 (×10): 100 mg via ORAL
  Filled 2023-01-03 (×10): qty 2

## 2023-01-03 MED ORDER — MIRTAZAPINE 15 MG PO TABS
15.0000 mg | ORAL_TABLET | Freq: Every day | ORAL | Status: DC
  Administered 2023-01-03 – 2023-01-05 (×3): 15 mg via ORAL
  Filled 2023-01-03 (×3): qty 1

## 2023-01-03 MED ORDER — BISOPROLOL FUMARATE 5 MG PO TABS
7.5000 mg | ORAL_TABLET | Freq: Every day | ORAL | Status: DC
  Administered 2023-01-04 – 2023-01-06 (×3): 7.5 mg via ORAL
  Filled 2023-01-03 (×5): qty 1.5

## 2023-01-03 MED ORDER — ONDANSETRON 4 MG PO TBDP
4.0000 mg | ORAL_TABLET | Freq: Three times a day (TID) | ORAL | Status: DC | PRN
  Administered 2023-01-04 – 2023-01-05 (×2): 4 mg via ORAL
  Filled 2023-01-03 (×2): qty 1

## 2023-01-03 NOTE — Progress Notes (Signed)
Pharmacy Antibiotic Note  Heather Oconnor is a 82 y.o. female admitted on 01/03/2023 with intra-abdominal infection.  Pharmacy has been consulted for zosyn dosing.  Plan: Zosyn 3.375G IV q8 hours extended infusion Monitor clinical progression and C/S     Temp (24hrs), Avg:99.1 F (37.3 C), Min:99.1 F (37.3 C), Max:99.1 F (37.3 C)  Recent Labs  Lab 12/30/22 0853 01/03/23 0830  WBC 12.3* 13.8*  CREATININE 0.96 0.97    Estimated Creatinine Clearance: 37 mL/min (by C-G formula based on SCr of 0.97 mg/dL).    Allergies  Allergen Reactions   Hctz [Hydrochlorothiazide]     Per GI may have caused pancreatitis. Dr. Christella Hartigan recommended never resuming.   Lisinopril     H/O pancreatitis.     Thank you for allowing pharmacy to be a part of this patient's care.  Toniann Fail Christop Hippert 01/03/2023 12:51 PM

## 2023-01-03 NOTE — Consult Note (Signed)
Consultation Note   Referring Provider:  Family Medicine Teaching Service PCP: Gabriel Earing, FNP Primary Gastroenterologist: Roetta Sessions, MD        Reason for Consultation: Elevated LFts, abnormal [place]  DOA: 01/03/2023         Hospital Day: 1   ASSESSMENT    Brief Narrative:  82 y.o. year old female with a history of cholelithiasis, IBS, possibly cirrhosis ( less concern for it based on newer imaging), GERD with esophagitis, small hiatal hernia, internal / external hemorrhoids, pancreatitis with pseudocysts, DM. Came to ED for recent US showing liver masses and also weakness, decreased appetite and weight loss.   Cholelithiasis with possible cholecystitis.  Surgery has evaluated, no indication for emergency surgery. Recommend IV abx and await further imaging  CBD dilation ( 0.9 cm) on [place]. Only minimal elevation of tbili to 1.4.  Rule out choledocholithiasis. Extrinsic compression by a liver lesion?   Multifocal liver lesions on [place], ? 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  Takes Plavix ( ? Hx of TIA / CVA)  [place] anemia.  Hgb 10.8, down from baseline of 11-12. Denies overt GI bleeding  GERD / history of esophagitis Asymptomatic on daily PPI  IBS.  Review of [facility]'s notes suggests she has diarrhea predominant IBS. Bowel movements at baseline.   See [facility] for any additional history     Active Problems:   * No active hospital problems. *     PLAN:   --I cancelled the CT scan and instead obtained MRI / MRCP. Results pending    HPI   Presented to ED today with weakness / poor appetite over last 3 weeks. The thought of food makes her nauseated. Symptoms progressive since Christmas Day. Saw PCP, was tender in RUQ and US done showing liver lesions suggestive of metastatic disease and also probable cholelithiasis. She hasn't had RUQ pain but rather  just feels "sore" there. She has no other GI complaints. IBS symptoms are at baseline. She has unintentionally lost 6 pounds since October.   Notable labs / Imaging / Events this admission  :   WBC 13.8 Hgb 10.8 Lactic acid normal Lipase normal.  AST 69, Tbili 1.4, alk phos 156 ( normal today)  RUQ US IMPRESSION: 1. Cholelithiasis with sonographic findings highly suspicious for acute calculus cholecystitis. 2. Common bile duct distention, suspicious for choledocholithiasis. Correlate with MRCP and obstructive liver enzymes. 3. Multifocal hepatic masses, largest within the LEFT lobe measuring up to 5 cm. These are incompletely characterized on this evaluation. Recommend dedicated, contrasted MR abdomen for further evaluation   Labs and Imaging: Recent Labs    01/03/23 0830  WBC 13.8*  HGB 10.8*  HCT 36.5  PLT 312   Recent Labs    01/03/23 0830  NA 139  K 4.5  CL 99  CO2 22  GLUCOSE 131*  BUN 23  CREATININE 0.97  CALCIUM 8.7*   Recent Labs    01/03/23 0830  PROT 6.1*  ALBUMIN 2.6*  AST 69*  ALT 13  ALKPHOS 109  BILITOT 1.4*   No results for input(s): "HEPBSAG", "HCVAB", "HEPAIGM", "HEPBIGM" in the last 72 hours. No results for  input(s): "LABPROT", "INR" in the last 72 hours.  Prior Endoscopic Evaluation: Colonoscopy 2009 with anal canal/external hemorrhoids. Long redundant colon and left-sided diverticula. Remainder of colon appeared normal.   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 Authorizing Provider  amLODipine (NORVASC) 10 MG tablet TAKE 1 TABLET EVERY DAY 12/24/22  Yes Gabriel Earing, FNP  aspirin EC 81 MG tablet Take 81 mg by mouth daily. Swallow whole.   Yes [provider]  atorvastatin (LIPITOR) 40 MG tablet TAKE 1 TABLET EVERY DAY 10/18/22  Yes Alver Sorrow, NP  bisoprolol (ZEBETA) 5 MG tablet TAKE 1 AND 1/2 TABLETS EVERY DAY Patient taking differently: Take 7.5 mg by mouth daily. 11/01/22  Yes Gabriel Earing, FNP  cloNIDine (CATAPRES) 0.2 MG tablet TAKE 1 TABLET TWICE DAILY 12/24/22  Yes Gabriel Earing, FNP  clopidogrel (PLAVIX) 75 MG tablet TAKE 1 TABLET EVERY DAY 12/24/22  Yes Gabriel Earing, FNP  dorzolamide-timolol (COSOPT) 22.3-6.8 MG/ML ophthalmic solution Place 1 drop into both eyes 2 (two) times daily. 02/04/21  Yes [provider]  hydrALAZINE (APRESOLINE) 100 MG tablet TAKE 1 TABLET THREE TIMES DAILY 12/24/22  Yes Gabriel Earing, FNP  metFORMIN (GLUCOPHAGE) 500 MG tablet TAKE 2 TABLETS TWICE DAILY WITH MEALS Patient taking differently: Take 1,000 mg by mouth 2 (two) times daily with a meal. Patient has only been taking one tablet twice daily due to illness. 01/03/2023. 12/24/22  Yes Gabriel Earing, FNP  mirtazapine (REMERON) 15 MG tablet TAKE 1 TABLET AT BEDTIME 12/27/22  Yes Gabriel Earing, FNP  ondansetron (ZOFRAN) 4 MG tablet Take 1 tablet (4 mg total) by mouth every 8 (eight) hours as needed for nausea or vomiting. 12/30/22  Yes Sonny Masters,  FNP  pantoprazole (PROTONIX) 40 MG tablet TAKE 1 TABLET EVERY DAY 12/24/22  Yes Gabriel Earing, FNP  spironolactone (ALDACTONE) 25 MG tablet Take 1 tablet (25 mg total) by mouth daily. 03/09/22  Yes Chilton Si, MD  valsartan (DIOVAN) 320 MG tablet Take 1 tablet (320 mg total) by mouth daily. 09/03/22  Yes Chilton Si, MD  vitamin B-12 (CYANOCOBALAMIN) 1000 MCG tablet Take 1,000 mcg by mouth daily.   Yes [provider]  Vitamin D, Ergocalciferol, (DRISDOL) 1.25 MG (50000 UNIT) CAPS capsule Take 1 capsule (50,000 Units total) by mouth every 7 (seven) days. 11/04/22  Yes Gabriel Earing, FNP  TRUE METRIX BLOOD GLUCOSE TEST test strip 1 each by Other route 4 (four) times daily as needed. 01/29/20   [provider]  TRUEplus Lancets 33G MISC Test BS up to four times daily as needed Dx E11.9 01/25/20   Gwenlyn Fudge, FNP    Current Facility-Administered Medications  Medication Dose Route Frequency Provider Last Rate Last Admin   piperacillin-tazobactam (ZOSYN) IVPB 3.375 g  3.375 g Intravenous Q8H Young, Travis J, DO       Current Outpatient Medications  Medication Sig Dispense Refill   amLODipine (NORVASC) 10 MG tablet TAKE 1 TABLET EVERY DAY 90 tablet 1   aspirin EC 81 MG tablet Take 81 mg by mouth daily. Swallow whole.     atorvastatin (LIPITOR) 40 MG tablet TAKE 1 TABLET EVERY DAY 30 tablet 0   bisoprolol (ZEBETA) 5 MG tablet TAKE 1 AND 1/2 TABLETS EVERY DAY (Patient taking differently: Take 7.5 mg by mouth daily.) 135 tablet 0   cloNIDine (CATAPRES) 0.2 MG tablet TAKE 1 TABLET TWICE DAILY 180 tablet 1   clopidogrel (PLAVIX) 75 MG tablet TAKE 1 TABLET EVERY DAY 90 tablet 1   dorzolamide-timolol (COSOPT) 22.3-6.8 MG/ML ophthalmic solution Place 1 drop into both eyes 2 (two) times daily.     hydrALAZINE (APRESOLINE) 100 MG tablet TAKE 1 TABLET THREE TIMES DAILY 270 tablet 1   metFORMIN (GLUCOPHAGE) 500 MG tablet TAKE 2 TABLETS TWICE DAILY WITH MEALS (Patient  taking differently: Take 1,000 mg by mouth 2 (two) times daily with a meal. Patient has only been taking one tablet twice daily due to illness. 01/03/2023.) 360 tablet 1   mirtazapine (REMERON) 15 MG tablet TAKE 1 TABLET AT BEDTIME 90 tablet 3   ondansetron (ZOFRAN) 4 MG tablet Take 1 tablet (4 mg total) by mouth every 8 (eight) hours as needed for nausea or vomiting. 20 tablet 0   pantoprazole (PROTONIX) 40 MG tablet TAKE 1 TABLET EVERY DAY 90 tablet 1   spironolactone (ALDACTONE) 25 MG tablet Take 1 tablet (25 mg total) by mouth daily. 90 tablet 3   valsartan (DIOVAN) 320 MG tablet Take 1 tablet (320 mg total) by mouth daily. 90 tablet 1   vitamin B-12 (CYANOCOBALAMIN) 1000 MCG tablet Take 1,000 mcg by mouth daily.     Vitamin D, Ergocalciferol, (DRISDOL) 1.25 MG (50000 UNIT) CAPS capsule Take 1 capsule (50,000 Units total) by mouth every 7 (seven) days. 12 capsule 0   TRUE METRIX BLOOD GLUCOSE TEST test strip 1 each by Other route 4 (four) times daily as needed.     TRUEplus Lancets 33G MISC Test BS up to four times daily as needed Dx E11.9 400 each 3    Allergies as of 01/03/2023 - Review Complete 01/03/2023  Allergen Reaction Noted   Hctz [hydrochlorothiazide]  07/13/2018   Lisinopril  12/07/2018    Social History   Socioeconomic History   Marital status: Widowed    Spouse name: Not on file   Number of children: 3   Years of education: Not on file   Highest education level: Not on file  Occupational History   Occupation: retired  Tobacco Use   Smoking status: Former    Current packs/day: 0.00    Types: Cigarettes    Start date: 11/26/1997    Quit date: 11/27/2006    Years since quitting: 16.1   Smokeless tobacco: Never  Vaping Use   Vaping status: Never Used  Substance and Sexual Activity   Alcohol use: No  Alcohol/week: 0.0 standard drinks of alcohol   Drug use: No   Sexual activity: Not Currently  Other Topics Concern   Not on file  Social History Narrative    Three children.  All live nearby.   Social Drivers of Corporate investment banker Strain: Low Risk  (12/07/2022)   Overall Financial Resource Strain (CARDIA)    Difficulty of Paying Living Expenses: Not hard at all  Food Insecurity: No Food Insecurity (12/07/2022)   Hunger Vital Sign    Worried About Running Out of Food in the Last Year: Never true    Ran Out of Food in the Last Year: Never true  Transportation Needs: No Transportation Needs (12/07/2022)   PRAPARE - Administrator, Civil Service (Medical): No    Lack of Transportation (Non-Medical): No  Physical Activity: Insufficiently Active (12/07/2022)   Exercise Vital Sign    Days of Exercise per Week: 3 days    Minutes of Exercise per Session: 30 min  Stress: No Stress Concern Present (12/07/2022)   [employer] of Occupational Health - Occupational Stress Questionnaire    Feeling of Stress : Not at all  Social Connections: Moderately Isolated (12/07/2022)   Social Connection and Isolation Panel [NHANES]    Frequency of Communication with Friends and Family: More than three times a week    Frequency of Social Gatherings with Friends and Family: Three times a week    Attends Religious Services: More than 4 times per year    Active Member of Clubs or Organizations: No    Attends Banker Meetings: Never    Marital Status: Widowed  Intimate Partner Violence: Not At Risk (12/07/2022)   Humiliation, Afraid, Rape, and Kick questionnaire    Fear of Current or Ex-Partner: No    Emotionally Abused: No    Physically Abused: No    Sexually Abused: No     Code Status   Code Status: Prior  Review of Systems: All systems reviewed and negative except where noted in HPI.  Physical Exam: Vital signs in last 24 hours: Temp:  [98.6 F (37 C)-99.1 F (37.3 C)] 98.6 F (37 C) (12/30 1315) Pulse Rate:  [86-89] 89 (12/30 1358) Resp:  [18-20] 20 (12/30 1358) BP: (156-164)/(75-82) 164/75 (12/30 1358) SpO2:  [92  %-93 %] 93 % (12/30 1358)    General:  Pleasant female in NAD Psych:  Cooperative. Normal mood and affect Eyes: Pupils equal Ears:  Normal auditory acuity Nose: No deformity, discharge or lesions Neck:  Supple, no masses felt Lungs:  Clear to auscultation.  Heart:  Regular rate, regular rhythm.  Abdomen:  Soft, nondistended, mild RUQ tenderness,  active bowel sounds, no masses felt Rectal :  Deferred Msk: Symmetrical without gross deformities.  Neurologic:  Alert, oriented, grossly normal neurologically Extremities : No edema Skin:  Intact without significant lesions.    Intake/Output from previous day: No intake/output data recorded. Intake/Output this shift:  Total I/O In: 1000 [IV Piggyback:1000] Out: -    Willette Cluster, NP-C   01/03/2023, 2:47 PM

## 2023-01-03 NOTE — ED Triage Notes (Signed)
Pt accompanied by family who reports pt has not been eating and has gotten weaker over the past 3 weeks. Pt has hx of gallstones and a mass on her liver.

## 2023-01-03 NOTE — ED Notes (Signed)
Patient still at MRI

## 2023-01-03 NOTE — Progress Notes (Signed)
Pg Dr Burley Saver at  [phone] to inquire if pt can have purewick due to extreme weakness and pain when turning. Per Dr. Miquel Dunn pt can have a purewick and she will remove the order "to not place purewick."

## 2023-01-03 NOTE — Progress Notes (Signed)
Pt arrived to unit from ED. No report received.

## 2023-01-03 NOTE — Assessment & Plan Note (Signed)
RUQ [place] with multifocal hepatic masses in the left lobe, measuring up to 5 cm.  Patient underwent MRI abdomen for further evaluation. - GI consulted, appreciate recommendations - Follow-up MRI abdomen

## 2023-01-03 NOTE — ED Notes (Signed)
Patient to MRI at this time.

## 2023-01-03 NOTE — Assessment & Plan Note (Signed)
Left portal vein thrombus seen on imaging.  Patient will require anticoagulation if this is indeed the case, though this is being held currently due to possible upcoming GI procedures for workup of issues as above.  Additionally this will complicate possible cholecystectomy. - Surgery following, appreciate recommendations

## 2023-01-03 NOTE — ED Provider Notes (Signed)
[place] [facility]ERGENCY DEPARTMENT AT [facility] Provider Note   CSN: [id number] Arrival date & time: 01/03/23  0820     History  Chief Complaint  Patient presents with   Weakness    Heather Oconnor is a 82 y.o. female.  82 year old female presenting emergency department with generalized weakness and right upper quadrant abdominal pain.  Reports decreased appetite for the past 3 weeks was seen by primary doctor did ultrasound; exam was told there was something wrong with her gallbladder.  Reports since Thursday right upper quadrant abdominal pain is seemingly gotten worse as well as generalized weakness.  Some nausea, no vomiting.   Weakness      Home Medications Prior to Admission medications   Medication Sig Start Date End Date Taking? Authorizing Provider  amLODipine (NORVASC) 10 MG tablet TAKE 1 TABLET EVERY DAY 12/24/22  Yes Gabriel Earing, FNP  aspirin EC 81 MG tablet Take 81 mg by mouth daily. Swallow whole.   Yes [provider]  atorvastatin (LIPITOR) 40 MG tablet TAKE 1 TABLET EVERY DAY 10/18/22  Yes Alver Sorrow, NP  bisoprolol (ZEBETA) 5 MG tablet TAKE 1 AND 1/2 TABLETS EVERY DAY Patient taking differently: Take 7.5 mg by mouth daily. 11/01/22  Yes Gabriel Earing, FNP  cloNIDine (CATAPRES) 0.2 MG tablet TAKE 1 TABLET TWICE DAILY 12/24/22  Yes Gabriel Earing, FNP  clopidogrel (PLAVIX) 75 MG tablet TAKE 1 TABLET EVERY DAY 12/24/22  Yes Gabriel Earing, FNP  dorzolamide-timolol (COSOPT) 22.3-6.8 MG/ML ophthalmic solution Place 1 drop into both eyes 2 (two) times daily. 02/04/21  Yes [provider]  hydrALAZINE (APRESOLINE) 100 MG tablet TAKE 1 TABLET THREE TIMES DAILY 12/24/22  Yes Gabriel Earing, FNP  metFORMIN (GLUCOPHAGE) 500 MG tablet TAKE 2 TABLETS TWICE DAILY WITH MEALS Patient taking differently: Take 1,000 mg by mouth 2 (two) times daily with a meal. Patient has only been taking one tablet twice daily due to  illness. 01/03/2023. 12/24/22  Yes Gabriel Earing, FNP  mirtazapine (REMERON) 15 MG tablet TAKE 1 TABLET AT BEDTIME 12/27/22  Yes Gabriel Earing, FNP  ondansetron (ZOFRAN) 4 MG tablet Take 1 tablet (4 mg total) by mouth every 8 (eight) hours as needed for nausea or vomiting. 12/30/22  Yes Sonny Masters, FNP  pantoprazole (PROTONIX) 40 MG tablet TAKE 1 TABLET EVERY DAY 12/24/22  Yes Gabriel Earing, FNP  spironolactone (ALDACTONE) 25 MG tablet Take 1 tablet (25 mg total) by mouth daily. 03/09/22  Yes Chilton Si, MD  valsartan (DIOVAN) 320 MG tablet Take 1 tablet (320 mg total) by mouth daily. 09/03/22  Yes Chilton Si, MD  vitamin B-12 (CYANOCOBALAMIN) 1000 MCG tablet Take 1,000 mcg by mouth daily.   Yes [provider]  Vitamin D, Ergocalciferol, (DRISDOL) 1.25 MG (50000 UNIT) CAPS capsule Take 1 capsule (50,000 Units total) by mouth every 7 (seven) days. 11/04/22  Yes Gabriel Earing, FNP  TRUE METRIX BLOOD GLUCOSE TEST test strip 1 each by Other route 4 (four) times daily as needed. 01/29/20   [provider]  TRUEplus Lancets 33G MISC Test BS up to four times daily as needed Dx E11.9 01/25/20   Gwenlyn Fudge, FNP      Allergies    Hctz [hydrochlorothiazide] and Lisinopril    Review of Systems   Review of Systems  Neurological:  Positive for weakness.    Physical Exam Updated Vital Signs BP (!) 164/75 (BP Location: Left Arm)  Pulse 89   Temp 98.6 F (37 C) (Oral)   Resp 20   SpO2 93%  Physical Exam Vitals and nursing note reviewed.  Constitutional:      General: She is not in acute distress.    Appearance: She is not toxic-appearing.  HENT:     Head: Normocephalic.     Nose: Nose normal.     Mouth/Throat:     Mouth: Mucous membranes are moist.  Eyes:     Conjunctiva/sclera: Conjunctivae normal.  Cardiovascular:     Rate and Rhythm: Normal rate and regular rhythm.  Pulmonary:     [place]: Pulmonary effort is normal.     Breath  sounds: Normal breath sounds.  Abdominal:     General: Abdomen is flat. There is distension.     Tenderness: There is abdominal tenderness. There is no rebound.  Musculoskeletal:     Right lower leg: No edema.     Left lower leg: No edema.  Skin:    General: Skin is warm.     Capillary Refill: Capillary refill takes less than 2 seconds.  Neurological:     Mental Status: She is alert and oriented to person, place, and time.  Psychiatric:        Mood and Affect: Mood normal.        Behavior: Behavior normal.     ED Results / Procedures / Treatments   Labs (all labs ordered are listed, but only abnormal results are displayed) Labs Reviewed  CBC - Abnormal; Notable for the following components:      Result Value   WBC 13.8 (*)    Hemoglobin 10.8 (*)    MCHC 29.6 (*)    All other components within normal limits  COMPREHENSIVE METABOLIC PANEL - Abnormal; Notable for the following components:   Glucose, Bld 131 (*)    Calcium 8.7 (*)    Total Protein 6.1 (*)    Albumin 2.6 (*)    AST 69 (*)    Total Bilirubin 1.4 (*)    GFR, Estimated 58 (*)    Anion gap 18 (*)    All other components within normal limits  MAGNESIUM - Abnormal; Notable for the following components:   Magnesium 1.3 (*)    All other components within normal limits  CBG MONITORING, ED - Abnormal; Notable for the following components:   Glucose-Capillary 119 (*)    All other components within normal limits  CULTURE, BLOOD (ROUTINE X 2)  CULTURE, BLOOD (ROUTINE X 2)  LIPASE, BLOOD  LACTIC ACID, PLASMA  URINALYSIS, ROUTINE W REFLEX MICROSCOPIC  LACTIC ACID, PLASMA    EKG None  Radiology US Abdomen Limited RUQ (LIVER/GB) Result Date: 01/03/2023 CLINICAL DATA:  151471 RUQ pain 151471 EXAM: ULTRASOUND ABDOMEN LIMITED RIGHT UPPER QUADRANT COMPARISON:  RIGHT upper quadrant ultrasound, 12/30/2022. CT AP, 12/25/2018. FINDINGS: Gallbladder: 1.3 cm dependent echogenic gallstone within a mildly distended  gallbladder. Gallbladder wall measures greater than the upper level normal thickness, at 0.4 cm. Trace pericholecystic fluid. A POSITIVE sonographic Eulah Pont sign was reported by sonographer. Common bile duct: Diameter: Distended, measuring up to 0.9 cm. Liver: Increased hepatic echogenicity, with a similar appearance of multifocal heterogeneously-echogenic hepatic masses, greatest within the LEFT hepatic lobe and measuring up to 5 cm. See key image. 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 The main portal vein appears patent on color Doppler imaging with normal direction of blood flow towards the liver. Other: No perihepatic free fluid. IMPRESSION: 1. Cholelithiasis with  sonographic findings highly suspicious for acute calculus cholecystitis. 2. Common bile duct distention, suspicious for choledocholithiasis. Correlate with MRCP and obstructive liver enzymes. 3. Multifocal hepatic masses, largest within the LEFT lobe measuring up to 5 cm. These are incompletely characterized on this evaluation. Recommend dedicated, contrasted MR abdomen for further evaluation. These results will be called to the ordering clinician or representative by the Radiologist Assistant, and communication documented in the PACS or [employer]. Electronically Signed   By: Roanna Banning M.D.   On: 01/03/2023 12:31    Procedures Procedures    Medications Ordered in ED Medications  piperacillin-tazobactam (ZOSYN) IVPB 3.375 g (has no administration in time range)  sodium chloride 0.9 % bolus 1,000 mL (0 mLs Intravenous Stopped 01/03/23 1406)  piperacillin-tazobactam (ZOSYN) IVPB 3.375 g (3.375 g Intravenous New Bag/Given 01/03/23 1403)    ED Course/ Medical Decision Making/ A&P Clinical Course as of 01/03/23 1533  Mon Jan 03, 2023  1030 [place] 12/26: "IMPRESSION: Multiple hepatic masses, highly suspicious for liver metastases. Recommend abdomen MRI without and with contrast for  further characterization.   Nondistended gallbladder with probable cholelithiasis. Diffuse gallbladder wall thickening is nonspecific, and may be due to underdistention, although other considerations include cholecystitis, hepatocellular disease, and pancreatitis.   Mildly dilated common bile duct, measuring 11 mm. This could also be further evaluated by MRI/MRCP."  [TY]  1031 CT abd in 2020: "MPRESSION: 1. 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 The stranding in the peripancreatic region on today's exam is probably chronic at this point, although I cannot completely exclude the possibility of acute pancreatitis, correlate with lipase levels. 2. Other imaging findings of potential clinical significance: Mild cardiomegaly. Left basilar clustered bulla suggesting emphysema. Levoconvex lumbar scoliosis. Stable small splenic lesions, likely incidental/benign. " [TY]  1248 US Abdomen Limited RUQ (LIVER/GB) IMPRESSION: 1. Cholelithiasis with sonographic findings highly suspicious for acute calculus cholecystitis. 2. Common bile duct distention, suspicious for choledocholithiasis. Correlate with MRCP and obstructive liver enzymes. 3. Multifocal hepatic masses, largest within the LEFT lobe measuring up to 5 cm. These are incompletely characterized on this evaluation. Recommend dedicated, contrasted MR abdomen for further evaluation.  These results will be called to the ordering clinician or representative by the Radiologist Assistant, and communication documented in the PACS or [employer].   Electronically Signed  Common bile duct:  Diameter: Distended, measuring up to 0.9 cm.   [TY]  1306 Spoke with PA on-call for surgery, recommending medicine admission with GI consult and surgery consult.  Would recommend  holding Plavix.  Agrees with CT scan [TY]  1315 Spoke with gastro; they will review chart and see patient. Agree with admission.   [TY]  [id number] Spoke with hospitalist; agrees to see and admit patient.  [TY]    Clinical Course User Index [TY] Coral Spikes, DO                                 Medical Decision Making Is an 82 year old female presenting with right upper quadrant abdominal pain and generalized weakness.  History of pancreatitis per chart review questionable cirrhosis, however subsequent workup with GI per their note are not concerned.  Patient had recent ultrasound with some gallbladder wall thickening and CBD dilation.  Borderline fever here, does have leukocytosis.  Minor elevation in her AST as well as  her T. bili.  She does have right upper quadrant abdominal pain on exam.  Family at bedside note marked decline in her functional status and increasing pain since Thursday.  Will plan to give IV fluids and repeat CT scan and ultrasound. See ED course for further MDM/disposition.   Amount and/or Complexity of Data Reviewed Labs: ordered. Radiology: ordered. Decision-making details documented in ED Course. ECG/medicine tests: ordered.  Risk Prescription drug management. Decision regarding hospitalization.          Final Clinical Impression(s) / ED Diagnoses Final diagnoses:  Acute cholecystitis  Choledocholithiasis    Rx / DC Orders ED Discharge Orders     None         Coral Spikes, DO 01/03/23 1533

## 2023-01-03 NOTE — Assessment & Plan Note (Signed)
RUQ [place] suspicious for cholecystitis and choledocholithiasis. -Admit to MedSurg, Dr. Miquel Dunn attending -Surgery following, no indication for emergent surgery, appreciate recommendations -Patient started on IV Zosyn -Blood cultures x 2 drawn, in process - Tylenol every 6 hours as needed - AM CBC, CMP - PT/OT to treat

## 2023-01-03 NOTE — H&P (Addendum)
Hospital Admission History and Physical Service Pager: [phone]  Patient name: Heather Oconnor Medical record number: [id number] Date of Birth: 09-17-1940 Age: 82 y.o. Gender: female  Primary Care Provider: Gabriel Earing, FNP Consultants: Gastroenterology, surgery Code Status: Full-which was confirmed with family if patient unable to confirm   Preferred Emergency Contact:  Contact Information     Name Relation Home Work Mobile   HARRIS,TABITHA Daughter   [phone]      Other Contacts   None on File      Chief Complaint: Abdominal pain  Assessment and Plan: Heather Oconnor is a 82 y.o. female presenting with abdominal pain. Differential for presentation of this includes gastric ulcer, gastroparesis, GERD, cholecystitis, pancreatitis.   Based on imaging cholecystitis is most compelling diagnosis. Patient's intermittent symptoms certainly fit this picture. Labs reveal leukocytosis and elevated LFTs. No evidence of pancreatitis on imaging, lipase is not elevated. Less suspicious for gastric etiology based on description of pain, no evidence of bleeding, no changes in symptoms with meals.  Assessment & Plan Cholecystitis RUQ [place] suspicious for cholecystitis and choledocholithiasis. -Admit to MedSurg, Dr. Miquel Dunn attending -Surgery following, no indication for emergent surgery, appreciate recommendations -Patient started on IV Zosyn -Blood cultures x 2 drawn, in process - Tylenol every 6 hours as needed - AM CBC, CMP - PT/OT to treat Lesion of liver RUQ [place] with multifocal hepatic masses in the left lobe, measuring up to 5 cm.  Patient underwent MRI abdomen for further evaluation. 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  Patient will require anticoagulation if this is indeed the case, though this is being held currently due to possible upcoming GI procedures for workup of  issues as above.  Additionally this will complicate possible cholecystectomy. - Surgery following, appreciate recommendations Weight loss Patient has had a recent weight loss noted by family; daughter estimates 6 pounds, but notes he could be more.  Patient has had reduced appetite lately and does have general deconditioning, all of which could explain weight loss.  There is concern however for possible malignancy given findings as above. -Consult RD, appreciate evaluation and recommendations  Chronic and Stable Problems:  History of CVA-holding Plavix, ASA for now pending further procedures T2DM-holding home metformin; CBGs 3 times daily and bedtime HTN-continue home amlodipine, bisoprolol, hydralazine, clonidine; hold home spironolactone for now HLD-continue home atorvastatin GERD-continue home Protonix   FEN/GI: Regular diet now; n.p.o. at midnight.  MiraLAX as needed. VTE Prophylaxis: SQ heparin  Disposition: med surg  History of Present Illness:  Heather Oconnor is a 82 y.o. female presenting with generalized weakness and right upper quadrant abdominal pain. She is accompanied by her daughter and son-in-law who help provide her history.  The patient began feeling poorly several weeks ago family noted that she had a decreased appetite.  Yesterday she began having abdominal pain which she says comes and goes intermittently.  Since that time she has not had any appetite at all and does not want to eat.  She has been occasionally drinking water.  Continues to make urine.  Reports loose stool, but this is normal for her; denies any blood in stool or dark stool.  Denies chest pain or pressure, shortness of breath.  Denies dizziness.  In the ED, RUQ [place] with evidence of cholecystitis and common bile duct distention suspicious for choledocholithiasis, as well as multifocal hepatic masses in the left liver lobe measuring  up to 5 cm.  Surgery and gastroenterology were both consulted and the  patient proceeded with MR abdomen MRCP, final results of which are not yet back.  Review Of Systems: Per HPI.  Pertinent Past Medical History: HTN Cholecystitis Pancreatitis T2DM GERD IBS Remainder reviewed in history tab.   Pertinent Past Surgical History: None Remainder reviewed in history tab.  Pertinent Social History: Tobacco use: Former-quit approximately 20 years ago Alcohol use: None Other Substance use: None Lives with self; daughter lives approximately 15 minutes away  Pertinent Family History: HTN - mother, father, sister, 2 brothers, son Remainder reviewed in history tab.   Important Outpatient Medications: Aspirin 81 mg daily Spironolactone 25 mg daily Valsartan 320 mg daily Amlodipine 10 mg daily Atorvastatin 40 mg daily Bisoprolol 1.5 mg daily Clonidine 0.2 mg 2 times daily Plavix 75 mg daily Hydralazine 100 mg 3 times daily Metformin 1000 mg 2 times daily Remeron 15 mg at bedtime Protonix 40 mg daily Zofran 4 mg as needed Vitamin B12 1000 mcg daily Vitamin D 50,000 units once a week Cosopt eyedrops twice daily Remainder reviewed in medication history.   Objective: BP (!) 167/55 (BP Location: Left Arm)   Pulse 88   Temp 98.6 F (37 C) (Oral)   Resp 15   SpO2 93%  Exam: General: Uncomfortable-appearing, no acute distress. Cardio: Regular rate, irregular rhythm, no murmurs on exam. Pulm: Clear, no wheezing, no crackles. No increased work of breathing. Abdominal: bowel sounds present, soft, quite tender in all 4 quadrants, mildly distended Extremities: no peripheral edema. Moves all extremities equally. Neuro: Alert and oriented x3, speech normal in content. Psych:  Cognition and judgment appear intact. Alert, communicative, and cooperative.   Labs:  CBC BMET  Recent Labs  Lab 01/03/23 0830  WBC 13.8*  HGB 10.8*  HCT 36.5  PLT 312   Recent Labs  Lab 01/03/23 0830  NA 139  K 4.5  CL 99  CO2 22  BUN 23  CREATININE 0.97  GLUCOSE  131*  CALCIUM 8.7*    Magnesium 1.3 Lactic acid 1.2 Lipase 22  Imaging Studies Performed:  US Abdomen Limited RUQ (LIVER/GB) Result Date: 01/03/2023 CLINICAL DATA:  151471 RUQ pain 151471 EXAM: ULTRASOUND ABDOMEN LIMITED RIGHT UPPER QUADRANT COMPARISON:  RIGHT upper quadrant ultrasound, 12/30/2022. CT AP, 12/25/2018. FINDINGS: Gallbladder: 1.3 cm dependent echogenic gallstone within a mildly distended gallbladder. Gallbladder wall measures greater than the upper level normal thickness, at 0.4 cm. Trace pericholecystic fluid. A POSITIVE sonographic Eulah Pont sign was reported by sonographer. Common bile duct: Diameter: Distended, measuring up to 0.9 cm. Liver: Increased hepatic echogenicity, with a similar appearance of multifocal heterogeneously-echogenic hepatic masses, greatest within the LEFT hepatic lobe and measuring up to 5 cm. See key image. 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 The main portal vein appears patent on color Doppler imaging with normal direction of blood flow towards the liver. Other: No perihepatic free fluid. IMPRESSION: 1. Cholelithiasis with sonographic findings highly suspicious for acute calculus cholecystitis. 2. Common bile duct distention, suspicious for choledocholithiasis. Correlate with MRCP and obstructive liver enzymes. 3. Multifocal hepatic masses, largest within the LEFT lobe measuring up to 5 cm. These are incompletely characterized on this evaluation. Recommend dedicated, contrasted MR abdomen for further evaluation. These results will be called to the ordering clinician or representative by the Radiologist Assistant, and communication documented in the PACS or [employer]. Electronically Signed   By: Roanna Banning M.D.   On: 01/03/2023 12:31  Cyndia Skeeters, DO 01/03/2023, 6:03 PM PGY-1, [facility]  FPTS Intern pager: [phone], text pages welcome Secure chat group [facility] Teaching Service

## 2023-01-03 NOTE — ED Notes (Signed)
ED TO INPATIENT HANDOFF REPORT  ED Nurse Name and Phone #: Einar Grad [phone]  S Name/Age/Gender Heather Oconnor 82 y.o. female Room/Bed: H020C/H020C  Code Status   Code Status: Prior  Home/SNF/Other Home Patient oriented to: self, place, time, and situation Is this baseline? Yes   Triage Complete: Triage complete  Chief Complaint Cholecystitis [K81.9]  Triage Note Pt accompanied by family who reports pt has not been eating and has gotten weaker over the past 3 weeks. Pt has hx of gallstones and a mass on her liver.    Allergies Allergies  Allergen Reactions   Hctz [Hydrochlorothiazide]     Per GI may have caused pancreatitis. Dr. Christella Hartigan recommended never resuming.   Lisinopril     H/O pancreatitis.    Level of Care/Admitting Diagnosis ED Disposition     ED Disposition  Admit   Condition  --   Comment  Hospital Area: [facility] [[id number]]  Level of Care: Med-Surg [16]  May admit patient to [facility] or [facility] if equivalent level of care is available:: No  Covid Evaluation: Asymptomatic - no recent exposure (last 10 days) testing not required  Diagnosis: Cholecystitis [191478]  Admitting Physician: Billey Co [[id number]]  Attending Physician: Billey Co [[id number]]  Certification:: I certify this patient will need inpatient services for at least 2 midnights  Expected Medical Readiness: 01/05/2023          B Medical/Surgery History 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   Qualifier: Diagnosis of  By: Diana Eves     Pancreatitis    Transient cerebral ischemia 05/08/2008   Qualifier: Diagnosis of  By: Diana Eves     Past Surgical History:  Procedure Laterality  Date   COLONOSCOPY  09/07/2007   RMR: anal canal/external hemorrhoids, redundant colon, left-sided diverticula, otherwise normal colonic mucosa     A IV Location/Drains/Wounds Patient Lines/Drains/Airways Status     Active Line/Drains/Airways     Name Placement date Placement time Site Days   Peripheral IV 01/03/23 20 G Right Antecubital 01/03/23  1241  Antecubital  less than 1   Nasoenteric Feeding Tube Cortrak - 43 inches Left nare Documented cm marking at nare/ corner of mouth 72 cm 07/10/18  1018  Left nare  1638            Intake/Output Last 24 hours  Intake/Output Summary (Last 24 hours) at 01/03/2023 1657 Last data filed at 01/03/2023 1628 Gross per 24 hour  Intake 1050 ml  Output --  Net 1050 ml    Labs/Imaging Results for orders placed or performed during the hospital encounter of 01/03/23 (from the past 48 hours)  CBC     Status: Abnormal   Collection Time: 01/03/23  8:30 AM  Result Value Ref Range   WBC 13.8 (H) 4.0 - 10.5 K/uL   RBC 3.99 3.87 - 5.11 MIL/uL   Hemoglobin 10.8 (L) 12.0 - 15.0 g/dL   HCT 69.6 29.5 - 28.4 %   MCV 91.5 80.0 - 100.0 fL   MCH 27.1 26.0 - 34.0 pg   MCHC 29.6 (L) 30.0 - 36.0 g/dL   RDW 13.2 44.0 - 10.2 %  Platelets 312 150 - 400 K/uL   nRBC 0.0 0.0 - 0.2 %    Comment: Performed at [facility], [address], [place], [place] [address]  Comprehensive metabolic panel     Status: Abnormal   Collection Time: 01/03/23  8:30 AM  Result Value Ref Range   Sodium 139 135 - 145 mmol/L   Potassium 4.5 3.5 - 5.1 mmol/L   Chloride 99 98 - 111 mmol/L   CO2 22 22 - 32 mmol/L   Glucose, Bld 131 (H) 70 - 99 mg/dL    Comment: Glucose reference range applies only to samples taken after fasting for at least 8 hours.   BUN 23 8 - 23 mg/dL   Creatinine, Ser 5.57 0.44 - 1.00 mg/dL   Calcium 8.7 (L) 8.9 - 10.3 mg/dL   Total Protein 6.1 (L) 6.5 - 8.1 g/dL   Albumin 2.6 (L) 3.5 - 5.0 g/dL   AST 69 (H) 15 - 41 U/L   ALT 13 0 - 44 U/L    Alkaline Phosphatase 109 38 - 126 U/L   Total Bilirubin 1.4 (H) <1.2 mg/dL   GFR, Estimated 58 (L) >60 mL/min    Comment: (NOTE) Calculated using the CKD-EPI Creatinine Equation (2021)    Anion gap 18 (H) 5 - 15    Comment: Performed at [facility], [address] [place], [place] [address]  Lipase, blood     Status: None   Collection Time: 01/03/23  8:30 AM  Result Value Ref Range   Lipase 22 11 - 51 U/L    Comment: Performed at [facility], [address], [place], [place] [address]  CBG monitoring, ED     Status: Abnormal   Collection Time: 01/03/23  8:34 AM  Result Value Ref Range   Glucose-Capillary 119 (H) 70 - 99 mg/dL    Comment: Glucose reference range applies only to samples taken after fasting for at least 8 hours.  Lactic acid, plasma     Status: None   Collection Time: 01/03/23 12:42 PM  Result Value Ref Range   Lactic Acid, Venous 1.2 0.5 - 1.9 mmol/L    Comment: Performed at [facility], [address], [place], [place] [address]  Magnesium     Status: Abnormal   Collection Time: 01/03/23 12:42 PM  Result Value Ref Range   Magnesium 1.3 (L) 1.7 - 2.4 mg/dL    Comment: Performed at [facility], [address], [place], [place] [address]   US Abdomen Limited RUQ (LIVER/GB) Result Date: 01/03/2023 CLINICAL DATA:  151471 RUQ pain 151471 EXAM: ULTRASOUND ABDOMEN LIMITED RIGHT UPPER QUADRANT COMPARISON:  RIGHT upper quadrant ultrasound, 12/30/2022. CT AP, 12/25/2018. FINDINGS: Gallbladder: 1.3 cm dependent echogenic gallstone within a mildly distended gallbladder. Gallbladder wall measures greater than the upper level normal thickness, at 0.4 cm. Trace pericholecystic fluid. A POSITIVE sonographic Eulah Pont sign was reported by sonographer. Common bile duct: Diameter: Distended, measuring up to 0.9 cm. Liver: Increased hepatic echogenicity, with a similar appearance of multifocal heterogeneously-echogenic hepatic masses, greatest within the LEFT  hepatic lobe and measuring up to 5 cm. See key image. 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 The main portal vein appears patent on color Doppler imaging with normal direction of blood flow towards the liver. Other: No perihepatic free fluid. IMPRESSION: 1. Cholelithiasis with sonographic findings highly suspicious for acute calculus cholecystitis. 2. Common bile duct distention, suspicious for choledocholithiasis. Correlate with  MRCP and obstructive liver enzymes. 3. Multifocal hepatic masses, largest within the LEFT lobe measuring up to 5 cm. These are incompletely characterized on this evaluation. Recommend dedicated, contrasted MR abdomen for further evaluation. These results will be called to the ordering clinician or representative by the Radiologist Assistant, and communication documented in the PACS or [employer]. Electronically Signed   By: Roanna Banning M.D.   On: 01/03/2023 12:31    Pending Labs Unresulted Labs (From admission, onward)     Start     Ordered   01/03/23 1442  Lactic acid, plasma  (Lactic Acid)  Now then every 2 hours,   STAT        01/03/23 1329   01/03/23 1319  Culture, blood (routine x 2)  BLOOD CULTURE X 2,   R (with STAT occurrences)      01/03/23 1318   01/03/23 0830  Urinalysis, Routine w reflex microscopic -Urine, Clean Catch  Once,   URGENT       Question:  Specimen Source  Answer:  Urine, Clean Catch   01/03/23 0830            Vitals/Pain Today's Vitals   01/03/23 0827 01/03/23 1315 01/03/23 1358  BP: (!) 156/82  (!) 164/75  Pulse: 86  89  Resp: 18  20  Temp: 99.1 F (37.3 C) 98.6 F (37 C)   TempSrc:  Oral   SpO2: 92%  93%    Isolation Precautions No active isolations  Medications Medications  piperacillin-tazobactam (ZOSYN) IVPB 3.375 g (has no administration in time range)  sodium chloride 0.9 % bolus 1,000 mL (0 mLs Intravenous Stopped 01/03/23 1406)  piperacillin-tazobactam (ZOSYN) IVPB 3.375 g (0 g  Intravenous Stopped 01/03/23 1628)  gadobutrol (GADAVIST) 1 MMOL/ML injection 7 mL (7 mLs Intravenous Contrast Given 01/03/23 1614)    Mobility Walks at home unassisted but has not ambulated here in ED     Focused Assessments GI   R Recommendations: See Admitting Provider Note  Report given to:   Additional Notes:

## 2023-01-03 NOTE — Assessment & Plan Note (Signed)
Patient has had a recent weight loss noted by family; daughter estimates 6 pounds, but notes he could be more.  Patient has had reduced appetite lately and does have general deconditioning, all of which could explain weight loss.  There is concern however for possible malignancy given findings as above. -Consult RD, appreciate evaluation and recommendations

## 2023-01-03 NOTE — Progress Notes (Signed)
  FMTS Attending Admission Note: Burley Saver, MD  Personal pager:  [phone] FPTS Service Pager:  [phone]   I  have personally seen and examined this patient, reviewed their chart and results. I have discussed this patient with the resident. I agree with the resident's findings, assessment and care plan as documented in the resident's note, will sign when available.  Ms Heather Oconnor is a pleasant 82 yo F with PMH CVA on Plavix, IBS, history of pancreatic pseudocysts, HLD, HTN, HFpEF, COPD, CKD stage 3b, ? H/o cirrhosis with normal follow up imaging per GI note in 09/2022, depression who presents with three weeks of diffuse abd pain, poor PO intake, and nausea. Denies emesis or blood in emesis or stool, last BM three days ago. She denies fevers or chills at home. Last colonoscopy was in 2009 with hemorrhoids and no polyps per GI records.   On exam, diffuse TTP, without guarding or rebound, + bowel sounds, mildly distended, no masses palpated. Heart RRR, lungs CTAB.  Labs and imaging reviewed.   Cholelithiasis with possible cholecystitis- surgery evaluated, IV antibiotics, no indication for emergency surgery. Will need surgical optimization versus perc chole drain pending clinical course.  Hyperbilirubinemia/transaminitis with dilated CBD on [place]- GI consulted, await MRCP results.  Liver lesions on [place]- await MRCP for further characterization.  Left portal vein thrombosis- await MRCP for confirmation, will need anticoagulation, appreciate GI recs.  History of CVA- hold plavix for now pending if needing surgical procedure/ERCP.   Will sign resident note when available.

## 2023-01-03 NOTE — Plan of Care (Signed)
FMTS Interim Progress Note  S: Went to bedside with Dr. Fatima Blank for nighttime rounding.  Patient states that she has pain all over and has not gotten anything unable to point to where her pain is she just states that it is however.  O: BP (!) 145/55 (BP Location: Left Arm)   Pulse 87   Temp 99.7 F (37.6 C) (Oral)   Resp 16   SpO2 92%    Gen: NAD, awake, alert, responds to some questions, oriented to person, place, situation Abdomen: Mildly distended, no guarding or rebound, nontender to palpation, bowel sounds normoactive  A/P: Cholecystitis  multiple liver masses CBD measuring 9 mm in diameter.  MRCP pending read to further assess liver lesions.  May need biopsies.  MRI to identify whether there is portal vein thrombosis and GI to weigh in about anticoagulation. -N.p.o. at midnight -Tylenol ordered, discussed with nursing to please administer for patient  Levin Erp, MD 01/03/2023, 9:41 PM PGY-3, [facility] Family Medicine Service pager [phone]

## 2023-01-03 NOTE — Consult Note (Signed)
ASRA MCWETHY 19-Aug-1940  [id number].    Requesting MD: Estanislado Pandy DO Chief Complaint/Reason for Consult: abdominal pain, elevated LFTs  HPI: Heather Oconnor is a 82 y.o. F who presents with a cc RUQ abdominal pain that began ~3 weeks ago. Reports associated nausea and symptoms are worse after po intake. Denies fever, cp, sob, cough, vomiting, urinary symptoms, diarrhea, melena or hematochezia. Has had some constipation w/ last bm 3 days ago. No hx of similar symptoms. She was seen by her PCP for this and had a RUQ [place] on 12/26 that showed multiple hepatic masses, a nondistended gallbladder with probable cholelithiasis, diffuse gallbladder wall thickening and a mildly dilated common bile duct, measuring 11 mm. Labs that day showed WBC 12.3, AST 43, Alk Phos 156, and normal ALT/T. Bili and Lipase. She reports generalized weakness 2/2 decreased po intake to the point it was hard for her to get oob today prompting her visit to the ED.   In the ED she has been afebrile without tachcyardia or hypotension. WBC 13.8. Cr wnl. Lipase wnl. Alk phos, ALT wnl. AST 69. T. Bili 1.4. Lactic wnl. Bcx pending. RUQ [place] w/ cholelithiasis, mildly distended gallbladder, gallbladder wall of 0.4 cm, trace pericholecystic fluid and a positive sonographic Murphy sign was reported by sonograph. CBD 0.9cm. There was also Increased hepatic echogenicity, with a similar appearance of multifocal heterogeneously-echogenic hepatic masses, greatest within the LEFT hepatic lobe and measuring up to 5 cm and at least partially-occlusive thrombus within the LEFT portal vein.  Past Medical History: HTN, HLD, DM2, depression, COPD, TIA, CAD, CHFpEF, and pancreatitis in 2020 with pseudocysts. There was a previous question of cirrhosis per GI notes that patient reports she was unaware of.  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 Her smoking use included cigarettes. She started smoking about 25 years ago. She has never used smokeless tobacco. She reports that she does not drink alcohol and does not use drugs.  Allergies:  Allergies  Allergen Reactions   Hctz [Hydrochlorothiazide]     Per GI may have caused pancreatitis. Dr. Christella Hartigan recommended never resuming.   Lisinopril     H/O pancreatitis.    (Not in a hospital admission)    Physical Exam: Blood pressure (!) 156/82, pulse 86, temperature 98.6 F (37 C), temperature source Oral, resp. rate 18, SpO2 92%. General: pleasant, WD/WN female who is laying in  bed in NAD HEENT: head is normocephalic, atraumatic.  Sclera are non-icteric. Heart: regular, rate, and rhythm.   Lungs: CTAB, no wheezes, rhonchi, or rales noted.  Respiratory effort nonlabored Abd: Soft, at least mild distension, epigastric and ruq ttp, +BS.  MS: no BUE or BLE edema Skin: warm and dry  Psych: A&Ox4 with an appropriate affect Neuro: normal speech, thought process intact, moves all extremities, gait not assessed  Results for orders placed or performed during the hospital encounter of 01/03/23 (from the past 48 hours)  CBC     Status: Abnormal   Collection Time: 01/03/23  8:30 AM  Result Value Ref Range   WBC 13.8 (H) 4.0 - 10.5 K/uL   RBC 3.99 3.87 - 5.11 MIL/uL   Hemoglobin 10.8 (L) 12.0 - 15.0 g/dL   HCT 16.1 09.6 - 04.5 %   MCV 91.5 80.0 - 100.0 fL   MCH 27.1 26.0 - 34.0 pg   MCHC 29.6 (L) 30.0 - 36.0 g/dL   RDW 40.9 81.1 - 91.4 %   Platelets 312 150 - 400 K/uL   nRBC 0.0 0.0 - 0.2 %    Comment: Performed at [facility], [address] [place], [place] [address]  Comprehensive metabolic panel     Status: Abnormal   Collection Time: 01/03/23  8:30 AM  Result Value Ref Range   Sodium 139 135 - 145 mmol/L   Potassium 4.5 3.5 - 5.1 mmol/L   Chloride 99 98 - 111 mmol/L   CO2 22 22 - 32 mmol/L   Glucose, Bld 131 (H) 70 - 99 mg/dL    Comment: Glucose reference range applies only to samples taken after fasting for at least 8 hours.   BUN 23 8 - 23 mg/dL   Creatinine, Ser 6.21 0.44 - 1.00 mg/dL   Calcium 8.7 (L) 8.9 - 10.3 mg/dL   Total Protein 6.1 (L) 6.5 - 8.1 g/dL   Albumin 2.6 (L) 3.5 - 5.0 g/dL   AST 69 (H) 15 - 41 U/L   ALT 13 0 - 44 U/L   Alkaline Phosphatase 109 38 - 126 U/L   Total Bilirubin 1.4 (H) <1.2 mg/dL   GFR, Estimated 58 (L) >60 mL/min    Comment: (NOTE) Calculated using the CKD-EPI Creatinine Equation (2021)    Anion gap 18 (H) 5 - 15    Comment: Performed at [facility], [address], [place], [place] [address]  Lipase,  blood     Status: None   Collection Time: 01/03/23  8:30 AM  Result Value Ref Range   Lipase 22 11 - 51 U/L    Comment: Performed at [facility], [address] [place], [place] [address]  CBG monitoring, ED     Status: Abnormal   Collection Time: 01/03/23  8:34 AM  Result Value Ref Range   Glucose-Capillary 119 (H) 70 - 99 mg/dL    Comment: Glucose reference range applies only to samples taken after fasting for at least 8 hours.   US Abdomen Limited  RUQ (LIVER/GB) Result Date: 01/03/2023 CLINICAL DATA:  151471 RUQ pain 151471 EXAM: ULTRASOUND ABDOMEN LIMITED RIGHT UPPER QUADRANT COMPARISON:  RIGHT upper quadrant ultrasound, 12/30/2022. CT AP, 12/25/2018. FINDINGS: Gallbladder: 1.3 cm dependent echogenic gallstone within a mildly distended gallbladder. Gallbladder wall measures greater than the upper level normal thickness, at 0.4 cm. Trace pericholecystic fluid. A POSITIVE sonographic Eulah Pont sign was reported by sonographer. Common bile duct: Diameter: Distended, measuring up to 0.9 cm. Liver: Increased hepatic echogenicity, with a similar appearance of multifocal heterogeneously-echogenic hepatic masses, greatest within the LEFT hepatic lobe and measuring up to 5 cm. See key image. 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 The main portal vein appears patent on color Doppler imaging with normal direction of blood flow towards the liver. Other: No perihepatic free fluid. IMPRESSION: 1. Cholelithiasis with sonographic findings highly suspicious for acute calculus cholecystitis. 2. Common bile duct distention, suspicious for choledocholithiasis. Correlate with MRCP and obstructive liver enzymes. 3. Multifocal hepatic masses, largest within the LEFT lobe measuring up to 5 cm. These are incompletely characterized on this evaluation. Recommend dedicated, contrasted MR abdomen for further evaluation. These results will be called to the ordering clinician or representative by  the Radiologist Assistant, and communication documented in the PACS or [employer]. Electronically Signed   By: Roanna Banning M.D.   On: 01/03/2023 12:31    Anti-infectives (From admission, onward)    Start     Dose/Rate Route Frequency Ordered Stop   01/03/23 1800  piperacillin-tazobactam (ZOSYN) IVPB 3.375 g        3.375 g 12.5 mL/hr over 240 Minutes Intravenous Every 8 hours 01/03/23 1251     01/03/23 1300  piperacillin-tazobactam (ZOSYN) IVPB 3.375 g        3.375 g 100 mL/hr over 30 Minutes Intravenous  Once 01/03/23 1251         Assessment/Plan Cholelithiasis with possible Cholecystitis  Hyperbilirubinemia/elevated LFT's with dilated CBD on US Liver lesions on [place] Left portal vein thrombus - Agree with GI consultation and further workup with CT/MRI that has already been ordered by EDP/GI - No indication for emergency surgery at this time - Would start on IV abx to cover for Cholecystitis.  - Recommend medicine admission. While she is undergoing more workup as listed above, would ask medicine to perform medical clearance and optimization for general anesthesia.  - If patient was to worsen before medical clearance or w/u is able to be completed, we would recommend Perc Chole drain. I do not think she needs this at this time. - AM labs.  - We will follow with you  FEN - NPO, IVF per primary  VTE - SCD's, okay for chem ppx or therapeutic anticoagulation (lovenox or heparin gtt) from our standpoint ID - Zosyn Foley - none Dispo - Admit to medical service, GI consult pending   PMH pancreatitis with pancreatic pseydocyst - diagnosed in 06/2018, RUQ U/S at the time did not show gallstones. IBS/fecal incontinence - followed by [facility], on imodium ?cirrhosis - noted on a CT 06/2018, has had subsequent CT scans suggesting hepatic steatosis but not cirrhosis. LFTs were normal 6 months ago. Platelets 312. GERD - PPI HTN Diabetes  TIA  COPD Depression  I  reviewed nursing notes, last 24 h vitals and pain scores, last 48 h intake and output, last 24 h labs and trends, and last 24 h imaging results.  Jacinto Halim, [facility] 01/03/2023, 1:18 PM Please see Amion for pager  number during day hours 7:00am-4:30pm

## 2023-01-04 DIAGNOSIS — K7460 Unspecified cirrhosis of liver: Secondary | ICD-10-CM

## 2023-01-04 DIAGNOSIS — R932 Abnormal findings on diagnostic imaging of liver and biliary tract: Secondary | ICD-10-CM | POA: Diagnosis not present

## 2023-01-04 DIAGNOSIS — K8050 Calculus of bile duct without cholangitis or cholecystitis without obstruction: Secondary | ICD-10-CM

## 2023-01-04 DIAGNOSIS — C220 Liver cell carcinoma: Principal | ICD-10-CM

## 2023-01-04 DIAGNOSIS — R160 Hepatomegaly, not elsewhere classified: Secondary | ICD-10-CM

## 2023-01-04 DIAGNOSIS — E43 Unspecified severe protein-calorie malnutrition: Secondary | ICD-10-CM | POA: Insufficient documentation

## 2023-01-04 DIAGNOSIS — Z7902 Long term (current) use of antithrombotics/antiplatelets: Secondary | ICD-10-CM

## 2023-01-04 LAB — COMPREHENSIVE METABOLIC PANEL
ALT: 10 U/L (ref 0–44)
AST: 45 U/L — ABNORMAL HIGH (ref 15–41)
Albumin: 2.1 g/dL — ABNORMAL LOW (ref 3.5–5.0)
Alkaline Phosphatase: 92 U/L (ref 38–126)
Anion gap: 10 (ref 5–15)
BUN: 21 mg/dL (ref 8–23)
CO2: 23 mmol/L (ref 22–32)
Calcium: 8.3 mg/dL — ABNORMAL LOW (ref 8.9–10.3)
Chloride: 105 mmol/L (ref 98–111)
Creatinine, Ser: 0.94 mg/dL (ref 0.44–1.00)
GFR, Estimated: >60 mL/min (ref >60)
Glucose, Bld: 109 mg/dL — ABNORMAL HIGH (ref 70–99)
Potassium: 4.3 mmol/L (ref 3.5–5.1)
Sodium: 138 mmol/L (ref 135–145)
Total Bilirubin: 1.3 mg/dL — ABNORMAL HIGH (ref 0.0–1.2)
Total Protein: 5.6 g/dL — ABNORMAL LOW (ref 6.5–8.1)

## 2023-01-04 LAB — CBC
HCT: 32 % — ABNORMAL LOW (ref 36.0–46.0)
Hemoglobin: 9.9 g/dL — ABNORMAL LOW (ref 12.0–15.0)
MCH: 27 pg (ref 26.0–34.0)
MCHC: 30.9 g/dL (ref 30.0–36.0)
MCV: 87.4 fL (ref 80.0–100.0)
Platelets: 266 10*3/uL (ref 150–400)
RBC: 3.66 MIL/uL — ABNORMAL LOW (ref 3.87–5.11)
RDW: 15.4 % (ref 11.5–15.5)
WBC: 10 10*3/uL (ref 4.0–10.5)
nRBC: 0 % (ref 0.0–0.2)

## 2023-01-04 LAB — BASIC METABOLIC PANEL
Anion gap: 13 (ref 5–15)
BUN: 26 mg/dL — ABNORMAL HIGH (ref 8–23)
CO2: 22 mmol/L (ref 22–32)
Calcium: 8.6 mg/dL — ABNORMAL LOW (ref 8.9–10.3)
Chloride: 101 mmol/L (ref 98–111)
Creatinine, Ser: 1.11 mg/dL — ABNORMAL HIGH (ref 0.44–1.00)
GFR, Estimated: 50 mL/min — ABNORMAL LOW (ref >60)
Glucose, Bld: 251 mg/dL — ABNORMAL HIGH (ref 70–99)
Potassium: 4 mmol/L (ref 3.5–5.1)
Sodium: 136 mmol/L (ref 135–145)

## 2023-01-04 LAB — PHOSPHORUS: Phosphorus: 3.4 mg/dL (ref 2.5–4.6)

## 2023-01-04 LAB — MAGNESIUM
Magnesium: 1.6 mg/dL — ABNORMAL LOW (ref 1.7–2.4)
Magnesium: 2.3 mg/dL (ref 1.7–2.4)

## 2023-01-04 LAB — GLUCOSE, CAPILLARY
Glucose-Capillary: 110 mg/dL — ABNORMAL HIGH (ref 70–99)
Glucose-Capillary: 96 mg/dL (ref 70–99)
Glucose-Capillary: 184 mg/dL — ABNORMAL HIGH (ref 70–99)
Glucose-Capillary: 184 mg/dL — ABNORMAL HIGH (ref 70–99)

## 2023-01-04 LAB — HEPARIN LEVEL (UNFRACTIONATED): Heparin Unfractionated: <0.1 [IU]/mL — ABNORMAL LOW (ref 0.30–0.70)

## 2023-01-04 MED ORDER — HEPARIN BOLUS VIA INFUSION
1500.0000 [IU] | Freq: Once | INTRAVENOUS | Status: AC
  Administered 2023-01-04: 1500 [IU] via INTRAVENOUS
  Filled 2023-01-04: qty 1500

## 2023-01-04 MED ORDER — BOOST / RESOURCE BREEZE PO LIQD CUSTOM
1.0000 | Freq: Three times a day (TID) | ORAL | Status: DC
  Administered 2023-01-04 – 2023-01-05 (×3): 1 via ORAL

## 2023-01-04 MED ORDER — PROSOURCE PLUS PO LIQD
30.0000 mL | Freq: Two times a day (BID) | ORAL | Status: DC
  Filled 2023-01-04: qty 30

## 2023-01-04 MED ORDER — THIAMINE MONONITRATE 100 MG PO TABS
100.0000 mg | ORAL_TABLET | Freq: Every day | ORAL | Status: DC
  Administered 2023-01-04 – 2023-01-06 (×3): 100 mg via ORAL
  Filled 2023-01-04 (×3): qty 1

## 2023-01-04 MED ORDER — MAGNESIUM SULFATE 2 GM/50ML IV SOLN
2.0000 g | Freq: Once | INTRAVENOUS | Status: AC
  Administered 2023-01-04: 2 g via INTRAVENOUS
  Filled 2023-01-04: qty 50

## 2023-01-04 MED ORDER — HEPARIN BOLUS VIA INFUSION
1150.0000 [IU] | Freq: Once | INTRAVENOUS | Status: DC
  Filled 2023-01-04: qty 1150

## 2023-01-04 MED ORDER — HEPARIN (PORCINE) 25000 UT/250ML-% IV SOLN
1100.0000 [IU]/h | INTRAVENOUS | Status: DC
  Administered 2023-01-04: 950 [IU]/h via INTRAVENOUS
  Administered 2023-01-05: 1100 [IU]/h via INTRAVENOUS
  Filled 2023-01-04 (×2): qty 250

## 2023-01-04 MED ORDER — ADULT MULTIVITAMIN W/MINERALS CH
1.0000 | ORAL_TABLET | Freq: Every day | ORAL | Status: DC
  Administered 2023-01-04: 1 via ORAL
  Filled 2023-01-04 (×3): qty 1

## 2023-01-04 NOTE — Evaluation (Addendum)
 Occupational Therapy Evaluation Patient Details Name: Heather Oconnor MRN: [id number] DOB: September 11, 1940 Today's Date: 01/04/2023   History of Present Illness Heather Oconnor is a pleasant 82 yo F with PMH CVA on Plavix , IBS, history of pancreatic pseudocysts, HLD, HTN, HFpEF, COPD, CKD stage 3b, H/o cirrhosis with normal follow up imaging per GI note in 09/2022, depression with three weeks of diffuse abd pain, poor PO intake, and nausea.   Clinical Impression   Pt presents with decline in function and safety with ADLs and ADL mobility with impaired strength, balance and endurance. Per pt and family, pt recently back to bed after being up working with PT and sitting in recliner ~30-45 minutes; pt declined getting back OOB. PTA pt lived at home alone and was Ind with ADLs/selfcare, IADLs,mobility, home mgt and was driving. Pt currently limited by fatigue and requires assist with ADLs and mobility. Pt would benefit from acute OT services to address impairments to maximize level of function and safety.  Per pt/family, prefers to return home with [facility] considering what pt wants/decides to do considering current dx and condition. Will update d/c recommendations as needed pending pt progress      If plan is discharge home, recommend the following: A lot of help with bathing/dressing/bathroom;A little help with walking and/or transfers;Assistance with cooking/housework;Assist for transportation    Functional Status Assessment  Patient has had a recent decline in their functional status and demonstrates the ability to make significant improvements in function in a reasonable and predictable amount of time.  Equipment Recommendations  Tub/shower bench    Recommendations for Other Services       Precautions / Restrictions Precautions Precautions: Fall Restrictions Weight Bearing Restrictions Per Provider Order: No      Mobility Bed Mobility               General bed mobility comments: pt  declined, recently got back to bed, was up with PT earlier and sat in recliner ~30-45 minutes per family    Transfers                   General transfer comment: declined      Balance                                           ADL either performed or assessed with clinical judgement   ADL Overall ADL's : Needs assistance/impaired Eating/Feeding: NPO   Grooming: Wash/dry hands;Wash/dry face;Set up;Bed level   Upper Body Bathing: Minimal assistance;Bed level Upper Body Bathing Details (indicate cue type and reason): simulated Lower Body Bathing: Maximal assistance;Bed level   Upper Body Dressing : Minimal assistance;Bed level   Lower Body Dressing: Maximal assistance;Bed level     Toilet Transfer Details (indicate cue type and reason): pt declined at this time           General ADL Comments: pt declined getiing OOB again and assessed at bed level     Vision         Perception         Praxis         Pertinent Vitals/Pain Pain Assessment Pain Assessment: all over     Extremity/Trunk Assessment             Communication Communication Communication: No apparent difficulties   Cognition Arousal: Alert Behavior During Therapy: [facility] for tasks assessed/performed, Flat affect  Overall Cognitive Status: Within Functional Limits for tasks assessed                                 General Comments: pt seems to be in depressed mood (understandibly), not very talkative     General Comments       Exercises     Shoulder Instructions      Home Living Family/patient expects to be discharged to:: Private residence Living Arrangements: Alone Available Help at Discharge: Family;Available PRN/intermittently Type of Home: House Home Access: Stairs to enter [employer] of Steps: 2 Entrance Stairs-Rails: None Home Layout: One level     Bathroom Shower/Tub: Tub/shower unit         Home Equipment:  [employer] (2 wheels);Shower seat          Prior Functioning/Environment Prior Level of Function : Independent/Modified Independent;Driving             Mobility Comments: independent until this last Thursday ADLs Comments: Ind with ADLs/selfcare, IADLs, drives        OT Problem List: Decreased strength;Decreased activity tolerance      OT Treatment/Interventions: Therapeutic activities;DME and/or AE instruction;Self-care/ADL training;Balance training;Therapeutic exercise;Patient/family education    OT Goals(Current goals can be found in the care plan section) Acute Rehab OT Goals Patient Stated Goal: return home OT Goal Formulation: With patient/family Time For Goal Achievement: 01/18/23 Potential to Achieve Goals: Good ADL Goals Pt Will Perform Grooming: with contact guard assist;standing;with caregiver independent in assisting Pt Will Perform Upper Body Bathing: with supervision;with caregiver independent in assisting Pt Will Perform Lower Body Bathing: with min assist;with contact guard assist;with caregiver independent in assisting Pt Will Perform Upper Body Dressing: with supervision;with caregiver independent in assisting Pt Will Perform Lower Body Dressing: with min assist;with contact guard assist;with caregiver independent in assisting Pt Will Transfer to Toilet: with mod assist;with min assist;ambulating Pt Will Perform Toileting - Clothing Manipulation and hygiene: with min assist;sit to/from stand Pt Will Perform Tub/Shower Transfer: with mod assist;with min assist;ambulating;tub bench;grab bars  OT Frequency: Min 2X/week    Co-evaluation              AM-PAC OT 6 Clicks Daily Activity     Outcome Measure Help from another person eating meals?: None Help from another person taking care of personal grooming?: A Little Help from another person toileting, which includes using toliet, bedpan, or urinal?: A Lot Help from another person bathing  (including washing, rinsing, drying)?: A Lot Help from another person to put on and taking off regular upper body clothing?: A Little Help from another person to put on and taking off regular lower body clothing?: A Lot 6 Click Score: 16   End of Session    Activity Tolerance: Patient limited by fatigue Patient left: in bed;with call bell/phone within reach;with family/visitor present  OT Visit Diagnosis: Muscle weakness (generalized) (M62.81)                Time: 8688-8662 OT Time Calculation (min): 26 min Charges:  OT General Charges $OT Visit: 1 Visit OT Evaluation $OT Eval Moderate Complexity: 1 Mod OT Treatments $Therapeutic Activity: 8-22 mins    Jacques Karna Loose 01/04/2023, 2:56 PM

## 2023-01-04 NOTE — Progress Notes (Addendum)
 Subjective/Chief Complaint: Still with ab pain, had mr but not read, some nausea   Objective: Vital signs in last 24 hours: Temp:  [97.5 F (36.4 C)-99.7 F (37.6 C)] 97.9 F (36.6 C) (12/31 0756) Pulse Rate:  [76-89] 84 (12/31 0756) Resp:  [14-20] 17 (12/31 0756) BP: (112-167)/(43-75) 148/70 (12/31 0756) SpO2:  [91 %-95 %] 94 % (12/31 0756) Last BM Date : 01/01/23  Intake/Output from previous day: 12/30 0701 - 12/31 0700 In: 1050 [IV Piggyback:1050] Out: -  Intake/Output this shift: Total I/O In: -  Out: 397 [Urine:397]  Ab nondistended tender epigastrium and ruq, no peritonitis  Lab Results:  Recent Labs    01/03/23 0830 01/04/23 0358  WBC 13.8* 10.0  HGB 10.8* 9.9*  HCT 36.5 32.0*  PLT 312 266   BMET Recent Labs    01/03/23 0830 01/04/23 0358  NA 139 138  K 4.5 4.3  CL 99 105  CO2 22 23  GLUCOSE 131* 109*  BUN 23 21  CREATININE 0.97 0.94  CALCIUM  8.7* 8.3*   PT/INR No results for input(s): LABPROT, INR in the last 72 hours. ABG No results for input(s): PHART, HCO3 in the last 72 hours.  Invalid input(s): PCO2, PO2  Studies/Results: US  Abdomen Limited RUQ (LIVER/GB) Result Date: 01/03/2023 CLINICAL DATA:  151471 RUQ pain 151471 EXAM: ULTRASOUND ABDOMEN LIMITED RIGHT UPPER QUADRANT COMPARISON:  RIGHT upper quadrant ultrasound, 12/30/2022. CT AP, 12/25/2018. FINDINGS: Gallbladder: 1.3 cm dependent echogenic gallstone within a mildly distended gallbladder. Gallbladder wall measures greater than the upper level normal thickness, at 0.4 cm. Trace pericholecystic fluid. A POSITIVE sonographic Beverley sign was reported by sonographer. Common bile duct: Diameter: Distended, measuring up to 0.9 cm. Liver: Increased hepatic echogenicity, with a similar appearance of multifocal heterogeneously-echogenic hepatic masses, greatest within the LEFT hepatic lobe and measuring up to 5 cm. See key image. At least partially-occlusive thrombus within the  LEFT portal vein. The main portal vein appears patent on color Doppler imaging with normal direction of blood flow towards the liver. Other: No perihepatic free fluid. IMPRESSION: 1. Cholelithiasis with sonographic findings highly suspicious for acute calculus cholecystitis. 2. Common bile duct distention, suspicious for choledocholithiasis. Correlate with MRCP and obstructive liver enzymes. 3. Multifocal hepatic masses, largest within the LEFT lobe measuring up to 5 cm. These are incompletely characterized on this evaluation. Recommend dedicated, contrasted MR abdomen for further evaluation. These results will be called to the ordering clinician or representative by the Radiologist Assistant, and communication documented in the PACS or [employer]. Electronically Signed   By: Thom Hall M.D.   On: 01/03/2023 12:31    Anti-infectives: Anti-infectives (From admission, onward)    Start     Dose/Rate Route Frequency Ordered Stop   01/03/23 1800  piperacillin -tazobactam (ZOSYN ) IVPB 3.375 g        3.375 g 12.5 mL/hr over 240 Minutes Intravenous Every 8 hours 01/03/23 1251     01/03/23 1300  piperacillin -tazobactam (ZOSYN ) IVPB 3.375 g        3.375 g 100 mL/hr over 30 Minutes Intravenous  Once 01/03/23 1251 01/03/23 1628       Assessment/Plan: Cholelithiasis  Hyperbilirubinemia/elevated LFT's with dilated CBD on US  Liver lesions on US  Left portal vein thrombus - await mrcp to determine next steps   FEN - NPO, IVF per primary  VTE - SCD's, okay for chem ppx or therapeutic anticoagulation (lovenox  or heparin  gtt) from our standpoint ID - Zosyn  for ? cholecystitis Foley - none Dispo -  Admit to medical service, GI consult pending     PMH pancreatitis with pancreatic pseydocyst - diagnosed in 06/2018, RUQ U/S at the time did not show gallstones. IBS/fecal incontinence - followed by [facility], on imodium ?cirrhosis - noted on a CT 06/2018, has had subsequent CT scans  suggesting hepatic steatosis but not cirrhosis. LFTs were normal 6 months ago. GERD - PPI HTN Diabetes  TIA  COPD Depression   Donnice Bury 01/04/2023   Addendum: mr results passed on by gi to me, likely hcc, choledocholithiasis. No real role for any surgery at this point, please call back if needed

## 2023-01-04 NOTE — Assessment & Plan Note (Addendum)
 Left portal vein thrombus seen on imaging.  Patient will require anticoagulation if this is indeed the case, will start IV heparin , incase procedure decided. Will consult oncology about anticoagulation. - Surgery following, appreciate recommendations - Heparin  per pharmacy - Consult Oncology

## 2023-01-04 NOTE — Plan of Care (Signed)

## 2023-01-04 NOTE — Assessment & Plan Note (Addendum)
 RUQ US  with multifocal hepatic masses in the left lobe, measuring up to 5 cm.  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 Will plan to consult oncology about plans for dx/management. Unsure if biopsy needed. Pt will need washout time for Plavix  before any procedure may be attempted. - GI consulted, appreciate recommendations - Consult Oncology

## 2023-01-04 NOTE — Progress Notes (Signed)
 PHARMACY - ANTICOAGULATION CONSULT NOTE  Pharmacy Consult for heparin   Indication:  portal vein thrombosis  Allergies  Allergen Reactions   Hctz [Hydrochlorothiazide]     Per GI may have caused pancreatitis. Dr. Teressa recommended never resuming.   Lisinopril      H/O pancreatitis.    Patient Measurements:   Heparin  Dosing Weight: 61.7kg   Vital Signs: Temp: 97.9 F (36.6 C) (12/31 0756) Temp Source: Oral (12/31 0756) BP: 148/70 (12/31 0756) Pulse Rate: 84 (12/31 0756)  Labs: Recent Labs    01/03/23 0830 01/04/23 0358  HGB 10.8* 9.9*  HCT 36.5 32.0*  PLT 312 266  CREATININE 0.97 0.94    Estimated Creatinine Clearance: 38.2 mL/min (by C-G formula based on SCr of 0.94 mg/dL).   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 Patient not on anticoagulation PTA, HgB 9.9 and PLT 266. Pharmacy consulted to dose heparin  gtt. Potential ERCP towards the end of the week.   Goal of Therapy:  Heparin  level 0.3-0.7 units/ml Monitor platelets by anticoagulation protocol: Yes   Plan:  D/c heparin  subcutaneous, small bolus of 1500u given that patient has received several doses this AM.  Start heparin  infusion at 950 units/hr Check anti-Xa level in 8 hours and daily while on heparin  Continue to monitor H&H and platelets  Powell Blush, PharmD, BCCCP  01/04/2023,12:03 PM

## 2023-01-04 NOTE — Assessment & Plan Note (Addendum)
 MRCP confirms choledocholithiasis, may not need treatment as GI does not believe she is obstructed. May consider ERCP pending plan for Liver findings. Anticipate no procedure until plavix  washout complete. GI okay w/ d/c Zosyn . - Surgery following, no indication for emergent surgery, appreciate recommendations - Tylenol  every 6 hours as needed - AM CBC, CMP - PT/OT to treat

## 2023-01-04 NOTE — Progress Notes (Signed)
 Progress Note   Subjective  MRCP returned, patient is about the same, no new complaints. Family at bedside and discussed MRCP findings.   Objective   Vital signs in last 24 hours: Temp:  [97.5 F (36.4 C)-99.7 F (37.6 C)] 97.9 F (36.6 C) (12/31 0756) Pulse Rate:  [76-89] 84 (12/31 0756) Resp:  [14-20] 17 (12/31 0756) BP: (112-167)/(43-75) 148/70 (12/31 0756) SpO2:  [91 %-95 %] 94 % (12/31 0756) Last BM Date : 01/01/23 General:    white female in NAD Neurologic:  Alert and oriented,  grossly normal neurologically. Psych:  Cooperative. Normal mood and affect.  Intake/Output from previous day: 12/30 0701 - 12/31 0700 In: 1050 [IV Piggyback:1050] Out: -  Intake/Output this shift: Total I/O In: -  Out: 397 [Urine:397]  Lab Results: Recent Labs    01/03/23 0830 01/04/23 0358  WBC 13.8* 10.0  HGB 10.8* 9.9*  HCT 36.5 32.0*  PLT 312 266   BMET Recent Labs    01/03/23 0830 01/04/23 0358  NA 139 138  K 4.5 4.3  CL 99 105  CO2 22 23  GLUCOSE 131* 109*  BUN 23 21  CREATININE 0.97 0.94  CALCIUM  8.7* 8.3*   LFT Recent Labs    01/04/23 0358  PROT 5.6*  ALBUMIN 2.1*  AST 45*  ALT 10  ALKPHOS 92  BILITOT 1.3*   PT/INR No results for input(s): LABPROT, INR in the last 72 hours.  Studies/Results: MR ABDOMEN MRCP W WO CONTAST Result Date: 01/04/2023 CLINICAL DATA:  Biliary obstruction. Liver metastasis suspected on ultrasound. EXAM: MRI ABDOMEN WITHOUT AND WITH CONTRAST (INCLUDING MRCP) TECHNIQUE: Multiplanar multisequence MR imaging of the abdomen was performed both before and after the administration of intravenous contrast. Heavily T2-weighted images of the biliary and pancreatic ducts were obtained, and three-dimensional MRCP images were rendered by post processing. CONTRAST:  7mL GADAVIST  GADOBUTROL  1 MMOL/ML IV SOLN COMPARISON:  Ultrasound 12/30/2022 FINDINGS: Lower chest:  Lung bases are clear. Hepatobiliary: Multiple round enhancing lesions  within LEFT and RIGHT hepatic lobe. Lesions reaches confluence in the lateral segment of LEFT hepatic lobe which is nearly complete replaced by enhancing lesions. These lesions demonstrate rapid early arterial enhancement with washout and peripheral pseudo capsules. (Series 1501) The confluent malignant lesions in the LEFT lateral hepatic lobe extend and expand the LEFT portal vein to 18 mm (image 43/series 1504). Findings consistent with tumor thrombus. This thrombus extends to the bifurcation of the main portal vein and partially involves the proximal RIGHT portal vein (image 48). There is biliary duct dilatation in the segment 4A of the liver. Gallbladder is distended to 58 mm. Several dependent gallstones are present. There several filling defects within distal common bile duct noted on coronal image 18/series 7. Filling defects stack at the distal common bile duct. The common bile duct is dilated to 11 mm. Several enlarged lymph nodes in the porta hepatis including 19 mm node on image 57/1502. Lymph node adjacent the head of the pancreas measures 11 mm image 73. Pancreas: Normal pancreatic parenchymal intensity. No ductal dilatation or inflammation. Spleen: Normal spleen. Adrenals/urinary tract: Adrenal glands and kidneys are normal. Stomach/Bowel: Stomach and limited of the small bowel is unremarkable Vascular/Lymphatic: Abdominal aortic normal caliber. No retroperitoneal periportal lymphadenopathy. Musculoskeletal: No aggressive osseous lesion IMPRESSION: 1. Multifocal bilobar hepatic malignant tumors. Lesions reach confluence within the LEFT lateral hepatic lobe. Tumor invasion the portal veins with large tumor thrombus within the LEFT portal vein and early tumor thrombus involvement  of the RIGHT portal vein. Enhancement pattern and portal vein tumor thrombosis are typical of hepatocellular carcinoma. 2. Porta hepatis lymphadenopathy. 3. Distal choledocholithiasis mild extrahepatic duct dilatation.  Gallbladder distension. These results will be called to the ordering clinician or representative by the Radiologist Assistant, and communication documented in the PACS or [employer]. Electronically Signed   By: Jackquline Boxer M.D.   On: 01/04/2023 08:59   MR 3D Recon At Scanner Result Date: 01/04/2023 CLINICAL DATA:  Biliary obstruction. Liver metastasis suspected on ultrasound. EXAM: MRI ABDOMEN WITHOUT AND WITH CONTRAST (INCLUDING MRCP) TECHNIQUE: Multiplanar multisequence MR imaging of the abdomen was performed both before and after the administration of intravenous contrast. Heavily T2-weighted images of the biliary and pancreatic ducts were obtained, and three-dimensional MRCP images were rendered by post processing. CONTRAST:  7mL GADAVIST  GADOBUTROL  1 MMOL/ML IV SOLN COMPARISON:  Ultrasound 12/30/2022 FINDINGS: Lower chest:  Lung bases are clear. Hepatobiliary: Multiple round enhancing lesions within LEFT and RIGHT hepatic lobe. Lesions reaches confluence in the lateral segment of LEFT hepatic lobe which is nearly complete replaced by enhancing lesions. These lesions demonstrate rapid early arterial enhancement with washout and peripheral pseudo capsules. (Series 1501) The confluent malignant lesions in the LEFT lateral hepatic lobe extend and expand the LEFT portal vein to 18 mm (image 43/series 1504). Findings consistent with tumor thrombus. This thrombus extends to the bifurcation of the main portal vein and partially involves the proximal RIGHT portal vein (image 48). There is biliary duct dilatation in the segment 4A of the liver. Gallbladder is distended to 58 mm. Several dependent gallstones are present. There several filling defects within distal common bile duct noted on coronal image 18/series 7. Filling defects stack at the distal common bile duct. The common bile duct is dilated to 11 mm. Several enlarged lymph nodes in the porta hepatis including 19 mm node on image 57/1502. Lymph  node adjacent the head of the pancreas measures 11 mm image 73. Pancreas: Normal pancreatic parenchymal intensity. No ductal dilatation or inflammation. Spleen: Normal spleen. Adrenals/urinary tract: Adrenal glands and kidneys are normal. Stomach/Bowel: Stomach and limited of the small bowel is unremarkable Vascular/Lymphatic: Abdominal aortic normal caliber. No retroperitoneal periportal lymphadenopathy. Musculoskeletal: No aggressive osseous lesion IMPRESSION: 1. Multifocal bilobar hepatic malignant tumors. Lesions reach confluence within the LEFT lateral hepatic lobe. 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 Enhancement pattern and portal vein tumor thrombosis are typical of hepatocellular carcinoma. 2. Porta hepatis lymphadenopathy. 3. Distal choledocholithiasis mild extrahepatic duct dilatation. Gallbladder distension. These results will be called to the ordering clinician or representative by the Radiologist Assistant, and communication documented in the PACS or [employer]. Electronically Signed   By: Jackquline Boxer M.D.   On: 01/04/2023 08:59   US  Abdomen Limited RUQ (LIVER/GB) Result Date: 01/03/2023 CLINICAL DATA:  151471 RUQ pain 151471 EXAM: ULTRASOUND ABDOMEN LIMITED RIGHT UPPER QUADRANT COMPARISON:  RIGHT upper quadrant ultrasound, 12/30/2022. CT AP, 12/25/2018. FINDINGS: Gallbladder: 1.3 cm dependent echogenic gallstone within a mildly distended gallbladder. Gallbladder wall measures greater than the upper level normal thickness, at 0.4 cm. Trace pericholecystic fluid. A POSITIVE sonographic Beverley sign was reported by sonographer. Common bile duct: Diameter: Distended, measuring up to 0.9 cm. Liver: Increased hepatic echogenicity, with a similar appearance of multifocal heterogeneously-echogenic hepatic masses, greatest within the LEFT hepatic lobe and measuring up to 5 cm. See key  image. At least partially-occlusive thrombus within  the LEFT portal vein. The main portal vein appears patent on color Doppler imaging with normal direction of blood flow towards the liver. Other: No perihepatic free fluid. IMPRESSION: 1. Cholelithiasis with sonographic findings highly suspicious for acute calculus cholecystitis. 2. Common bile duct distention, suspicious for choledocholithiasis. Correlate with MRCP and obstructive liver enzymes. 3. Multifocal hepatic masses, largest within the LEFT lobe measuring up to 5 cm. These are incompletely characterized on this evaluation. Recommend dedicated, contrasted MR abdomen for further evaluation. These results will be called to the ordering clinician or representative by the Radiologist Assistant, and communication documented in the PACS or [employer]. Electronically Signed   By: Thom Hall M.D.   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  She has a history of cirrhosis, unfortunately has multifocal liver masses that are most consistent with [facility] per radiology.  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  In addition, there is some choledocholithiasis noted.  Her liver enzymes are pretty normal, I do not think she is obstructed in regards to the choledocholithiasis and it is unclear if this is causing her symptoms of pain versus just the tumor burden.  Discussed these findings at length.  Recommending oncology consultation about options for [facility] management and discussion of goals of care.  She is not a surgical candidate or transplant candidate given her age and imaging findings.  Question for oncology as if they are comfortable making this diagnosis  radiographically or if they need a biopsy, and the timing of that given her Plavix  use.  I have ordered an AFP level.  I do think she warrants anticoagulation for her portal vein thrombosis, defer to oncology/primary service in regards to regimen.  If considering biopsy or ERCP pending her course then would prefer heparin  initially.  I will need to discuss her case with my advanced endoscopy colleagues.  Based on her liver enzymes I think choledocholithiasis may be incidental as she is not obstructed, her symptoms may more likely be related to tumor burden, although really hard to say without removing the stones.  If we were to consider ERCP, would need more time for Plavix  to washout.  Will await oncology evaluation and discussion of goals of care initially to determine if you want to proceed with ERCP later this week or not.  RECOMMEND: - oncology consultation - discuss if biopsy is needed and treatment options - anticoagulation for portal vein thrombosis - will discuss with my advanced endoscopy colleagues about potential for ERCP - if this is considered and when. Again, not convinced this is causing her symptoms and she does not appear obstructed based on labs. Continue to hold Plavix  for now  Call with questions.  Marcey Naval, MD [facility]

## 2023-01-04 NOTE — Plan of Care (Signed)
NPO order was not in place overnight. Spoke with nursing - patient did drink sip of water 1 hour ago but nothing eating after MN. NPO order placed.

## 2023-01-04 NOTE — Assessment & Plan Note (Addendum)
 AST elevated to 43 on admission, stable at 45 today. Likely elevated in setting of liver pathology and choledocholithiasis.  -CTM -AM CMP

## 2023-01-04 NOTE — Progress Notes (Addendum)
 Initial Nutrition Assessment  DOCUMENTATION CODES:   Severe malnutrition in context of acute illness/injury  INTERVENTION:   - Continue regular diet and encourage PO intake  -Boost Breeze po TID, each supplement provides 250 kcal and 9 grams of protein - Mighty Shake TID with meals, each supplement provides 330 kcals and 9 grams of protein, prefers vanilla  - Prosource Plus daily, each supplement provides 100 kcal and 15 gm protein  - MVI with minerals daily - 100 mg Thiamine  daily  - Snacks daily  - Recommend checking Vitamin D  labs as pt was deficient in October but thereafter was being supplemented 50,000 units vitamin D  once weekly  Monitor magnesium , potassium, and phosphorus BID for at least 3 days, MD to replete as needed, as pt is at risk for refeeding syndrome given Severe malnutrition.   NUTRITION DIAGNOSIS:   Severe Malnutrition related to chronic illness as evidenced by severe muscle depletion, severe fat depletion, percent weight loss, energy intake < or equal to 75% for > or equal to 1 month (9% weight loss in 3 months).   GOAL:   Patient will meet greater than or equal to 90% of their needs   MONITOR:   PO intake, Weight trends, Supplement acceptance, Labs, I & O's  REASON FOR ASSESSMENT:   Consult Assessment of nutrition requirement/status  ASSESSMENT:  82 yo F with PMH CVA, IBS, pancreatitis, GERD, T2DM HLD, HTN, HFpEF, COPD, CKD stage 3b, cirrhosis who presents with abd pain, poor PO intake, and weakness. Found to have cholelithiasis, multifocal liver masse-liekly HCC, with a partial thrombosis of the left portal vein.  Pt resting on bed, appeared very tired with family at bedside. Family reports pt has just been nibbling on food for the last 3 weeks and had significantly decreased appetite. During this time pt states that even the thought of food made her feel nauseas. However they noticed she completely stopped eating 1 day prior to admission and she  complained of abdominal pain.   Pt was NPO overnight for MRI, now on a regular diet. Pt ordered mashed potatoes, pudding, milk and lemonade for lunch. Discussed with patient ways to increase calories and protein. Pt not agreeable to many oral nutritional supplements due to having a bad experience with them and personal preference. Discussed the importance of making sure to get adequate nutrition during this time. She was hesitant but willing to try Boost Breeze, Mighty shakes, ProSource Plus, and fruit as a snack. Pt has some nausea and continues to have abdominal pain. Pt reports at baseline her stools are loose.   Pt's weight trending down. She has lost 14 lbs since September, 9% weight loss in 3 months. Of note pt was vitamin D  deficient in October and takes 50,000 units Vitamin D  at once weekly at home, will recheck and if low if add to supplement.   Admit weight: 61.7 kg  Current weight: 61.7 kg    Average Meal Intake: NPO  Nutritionally Relevant Medications: Scheduled Meds:  (feeding supplement) PROSource Plus  30 mL Oral BID BM   amLODipine   10 mg Oral Daily   atorvastatin   40 mg Oral Daily   bisoprolol   7.5 mg Oral Daily   cloNIDine   0.2 mg Oral BID   dorzolamide -timolol   1 drop Both Eyes BID   feeding supplement  1 Container Oral TID BM   hydrALAZINE   100 mg Oral TID   mirtazapine   15 mg Oral QHS   multivitamin with minerals  1 tablet Oral  Daily   pantoprazole   40 mg Oral Daily   thiamine   100 mg Oral Daily   Continuous Infusions:  piperacillin -tazobactam (ZOSYN )  IV 3.375 g (01/04/23 0513)   Labs Reviewed: Calcium  8.3, Magnesium  1.6, Vitamin D  4.1 (10/2022/) CBG ranges from 110-119 mg/dL over the last 24 hours HgbA1c 6.7 (2020)  NUTRITION - FOCUSED PHYSICAL EXAM:  Flowsheet Row Most Recent Value  Orbital Region Moderate depletion  Upper Arm Region Severe depletion  Thoracic and Lumbar Region Moderate depletion  Buccal Region Severe depletion  Temple Region Severe  depletion  Clavicle Bone Region Severe depletion  Clavicle and Acromion Bone Region Severe depletion  Scapular Bone Region Severe depletion  Dorsal Hand Severe depletion  Patellar Region Severe depletion  Anterior Thigh Region Severe depletion  Posterior Calf Region Severe depletion  Edema (RD Assessment) None  Hair Reviewed  Eyes Reviewed  Mouth Reviewed  [dry and cracked]  Skin Reviewed  Nails Reviewed       Diet Order:   Diet Order             Diet regular Room service appropriate? Yes; Fluid consistency: Thin  Diet effective now                   EDUCATION NEEDS:   Education needs have been addressed  Skin:  Skin Assessment: Reviewed RN Assessment  Last BM:  12/28  Height:   Ht Readings from Last 1 Encounters:  12/30/22 5' 3 (1.6 m)    Weight:   Wt Readings from Last 1 Encounters:  12/30/22 61.7 kg    Ideal Body Weight:  52.27 kg  BMI:  There is no height or weight on file to calculate BMI.  Estimated Nutritional Needs:   Kcal:  1400-1600 kcal  Protein:  70-90 gm  Fluid:  >1.4L   Olivia Kenning, RD Registered Dietitian  See Amion for more information

## 2023-01-04 NOTE — Plan of Care (Signed)
 FMTS Brief Progress Note  Phone call w/ Dr. Loretha - Oncology  Called and discussed patient with Dr. Loretha, noting patient came in for abdominal pain and was found to have [facility] and choledocholithiasis. Pain control and dx were keeping patient here, but Oncology noted that the treatment for this is outpatient. She state's that most therapies are oral and not available for in hospital. She also noted that we don't need a biopsy as MRI had classic pattern.   She is unable to see patient today, but if patient is to remain in the hospital, she will try to see them tomorrow. If patient is okay for d/c she will set up scheduling for outpatient follow up. This provider to secure chat Dr. Loretha w/ plans for d/c vs staying, after consulting w/ team.      Jennelle Riis, MD 01/04/2023, 2:38 PM PGY-3, [facility] Night Resident  Please page [phone] with questions.

## 2023-01-04 NOTE — Assessment & Plan Note (Addendum)
 Patient has had a recent weight loss noted by family; daughter estimates 6 pounds, but notes it could be more.  RD following and concerned for severe malnutrition/refeeding syndrome. Will obtain K, Mg, Ph labs BID. - RD following, appreciate recommendations - BMP, Mg, Phos BID x 3 days

## 2023-01-04 NOTE — Evaluation (Signed)
 Physical Therapy Evaluation Patient Details Name: Heather Oconnor MRN: [id number] DOB: 1940/06/27 Today's Date: 01/04/2023  History of Present Illness  Pt is a 82 y.o. F who presents 01/03/2023 with abdominal pain. RUQ US  with multifocal hepatic masses in the left lobe, measuring up to 5 cm.  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 Significant PMH: CVA on Plavix , IBS, history of pancreatic pseudocysts, HLD, HTN, HFpEF, COPD, CKD stage 3b, H/o cirrhosis.  Clinical Impression  PTA, pt lives alone and was completely independent up until end of last week. Pt presents with generalized weakness, impaired standing balance, decreased activity tolerance. Pt requiring minimal assist for transfers and ambulating 15 ft with a walker. Further distance limited due to fatigue. Pt/pt family with strong desire to go home at d/c. Will continue to follow acutely.        If plan is discharge home, recommend the following: A little help with walking and/or transfers;A little help with bathing/dressing/bathroom;Assistance with cooking/housework;Assist for transportation;Help with stairs or ramp for entrance   Can travel by private vehicle        Equipment Recommendations Other (comment) (tub/shower bench)  Recommendations for Other Services       Functional Status Assessment Patient has had a recent decline in their functional status and demonstrates the ability to make significant improvements in function in a reasonable and predictable amount of time.     Precautions / Restrictions Precautions Precautions: Fall Restrictions Weight Bearing Restrictions Per Provider Order: No      Mobility  Bed Mobility Overal bed mobility: Needs Assistance Bed Mobility: Sit to Supine       Sit to supine: Min assist   General bed mobility comments: LE assist back into bed    Transfers Overall transfer level: Needs assistance Equipment used: Rolling walker (2 wheels) Transfers: Sit  to/from Stand Sit to Stand: Min assist           General transfer comment: MinA to rise to walker, cues for hand placement    Ambulation/Gait Ambulation/Gait assistance: Min assist Gait Distance (Feet): 15 Feet Assistive device: Rolling walker (2 wheels) Gait Pattern/deviations: Step-through pattern, Decreased stride length Gait velocity: decreased Gait velocity interpretation: <1.31 ft/sec, indicative of household ambulator   General Gait Details: intermittent assist for steering walker, slowed pace  Stairs            Wheelchair Mobility     Tilt Bed    Modified Rankin (Stroke Patients Only)       Balance Overall balance assessment: Needs assistance Sitting-balance support: Feet supported Sitting balance-Leahy Scale: Good     Standing balance support: Bilateral upper extremity supported Standing balance-Leahy Scale: Poor                               Pertinent Vitals/Pain Pain Assessment Pain Assessment: Faces Faces Pain Scale: Hurts little more Pain Location: all over Pain Descriptors / Indicators: Grimacing Pain Intervention(s): Monitored during session, Limited activity within patient's tolerance    Home Living Family/patient expects to be discharged to:: Private residence Living Arrangements: Alone Available Help at Discharge: Family;Available PRN/intermittently Type of Home: House Home Access: Stairs to enter Entrance Stairs-Rails: None Entrance Stairs-Number of Steps: 2   Home Layout: One level Home Equipment: [employer] (2 wheels);Shower seat      Prior Function Prior Level of Function : Independent/Modified Independent;Driving             Mobility Comments: independent  until this last Thursday ADLs Comments: Ind with ADLs/selfcare, IADLs, drives     Extremity/Trunk Assessment   Upper Extremity Assessment Upper Extremity Assessment: Generalized weakness    Lower Extremity Assessment Lower Extremity  Assessment: Generalized weakness    Cervical / Trunk Assessment Cervical / Trunk Assessment: Kyphotic  Communication   Communication Communication: No apparent difficulties  Cognition Arousal: Alert Behavior During Therapy: WFL for tasks assessed/performed, Flat affect Overall Cognitive Status: Within Functional Limits for tasks assessed                                          General Comments      Exercises     Assessment/Plan    PT Assessment Patient needs continued PT services  PT Problem List Decreased strength;Decreased activity tolerance;Decreased balance;Decreased mobility       PT Treatment Interventions DME instruction;Gait training;Functional mobility training;Therapeutic activities;Stair training;Therapeutic exercise;Balance training;Patient/family education    PT Goals (Current goals can be found in the Care Plan section)  Acute Rehab PT Goals Patient Stated Goal: go home PT Goal Formulation: With patient/family Time For Goal Achievement: 01/18/23 Potential to Achieve Goals: Fair    Frequency Min 1X/week     Co-evaluation               AM-PAC PT 6 Clicks Mobility  Outcome Measure Help needed turning from your back to your side while in a flat bed without using bedrails?: A Little Help needed moving from lying on your back to sitting on the side of a flat bed without using bedrails?: A Little Help needed moving to and from a bed to a chair (including a wheelchair)?: A Little Help needed standing up from a chair using your arms (e.g., wheelchair or bedside chair)?: A Little Help needed to walk in hospital room?: A Little Help needed climbing 3-5 steps with a railing? : A Lot 6 Click Score: 17    End of Session Equipment Utilized During Treatment: Gait belt Activity Tolerance: Patient tolerated treatment well Patient left: in bed;with call bell/phone within reach;with bed alarm set Nurse Communication: Mobility status PT  Visit Diagnosis: Unsteadiness on feet (R26.81);Muscle weakness (generalized) (M62.81)    Time: 8847-8781 PT Time Calculation (min) (ACUTE ONLY): 26 min   Charges:   PT Evaluation $PT Eval Low Complexity: 1 Low PT Treatments $Therapeutic Activity: 8-22 mins PT General Charges $$ ACUTE PT VISIT: 1 Visit         Aleck Oconnor, PT, DPT Acute Rehabilitation Services Office [phone]   Heather Oconnor 01/04/2023, 3:11 PM

## 2023-01-04 NOTE — Progress Notes (Addendum)
 Daily Progress Note Intern Pager: [phone]  Patient name: Heather Oconnor Medical record number: [id number] Date of birth: [birth year] Age: 82 y.o. Gender: female  Primary Care Provider: [name] HERO, FNP Consultants: GI, Surgery Code Status: Full Code  Pt Overview and Major Events to Date:  12/30 - Admitted / MRCP  Assessment and Plan:  Heather Oconnor is a 82 yo woman w/ PMH of Resistant hypertension, CVA, CAD, T2DM, GERD, IBS  Assessment & Plan Cholecystitis MRCP confirms choledocholithiasis, may not need treatment as GI does not believe she is obstructed. May consider ERCP pending plan for Liver findings. Anticipate no procedure until plavix  washout complete. GI okay w/ d/c Zosyn . - Surgery following, no indication for emergent surgery, appreciate recommendations - Tylenol  every 6 hours as needed - AM CBC, CMP - PT/OT to treat Lesion of liver RUQ US  with multifocal hepatic masses in the left lobe, measuring up to 5 cm.  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 Will plan to consult oncology about plans for dx/management. Unsure if biopsy needed. Pt will need washout time for Plavix  before any procedure may be attempted. 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  Patient will require anticoagulation if this is indeed the case, will start IV heparin , incase procedure decided. Will consult oncology about anticoagulation. - Surgery following, appreciate recommendations - Heparin  per pharmacy - Consult Oncology Weight loss Patient has had a recent weight loss noted by family; daughter estimates 6 pounds, but notes it could be more.  RD following and concerned for severe malnutrition/refeeding syndrome. Will obtain K, Mg, Ph labs BID. - RD following, appreciate recommendations - BMP, Mg, Phos BID x 3 days Transaminitis AST elevated to 43 on admission, stable at 45  today. Likely elevated in setting of liver pathology and choledocholithiasis.  -CTM -AM CMP   Chronic and Stable Issues: History of CVA-holding Plavix , ASA for now pending further procedures T2DM-holding home metformin ; CBGs 3 times daily and bedtime HTN-continue home amlodipine , bisoprolol , hydralazine , clonidine ; hold home spironolactone  for now HLD-continue home atorvastatin  GERD-continue home Protonix   FEN/GI: regular diet PPx: SQ heparin  Dispo:Pending PT recommendations  pending clinical improvement .   Subjective:  Doing okay today, no pain, was sad about news of having HCC  Objective: Temp:  [97.5 F (36.4 C)-99.7 F (37.6 C)] 97.9 F (36.6 C) (12/31 0756) Pulse Rate:  [76-89] 84 (12/31 0756) Resp:  [14-20] 17 (12/31 0756) BP: (112-167)/(43-75) 148/70 (12/31 0756) SpO2:  [91 %-95 %] 94 % (12/31 0756) Physical Exam: General: NAD, comfortable Cardiovascular: RRR, S1/S2, NRMG Respiratory: CTABL, normal wob Abdomen: Soft, NTTP, non-distended Extremities: Moving all extremities, no edema or swelling  Laboratory: Most recent CBC Lab Results  Component Value Date   WBC 10.0 01/04/2023   HGB 9.9 (L) 01/04/2023   HCT 32.0 (L) 01/04/2023   MCV 87.4 01/04/2023   PLT 266 01/04/2023   Most recent BMP    Latest Ref Rng & Units 01/04/2023    3:58 AM  BMP  Glucose 70 - 99 mg/dL 890   BUN 8 - 23 mg/dL 21   Creatinine 9.55 - 1.00 mg/dL 9.05   Sodium 864 - 854 mmol/L 138   Potassium 3.5 - 5.1 mmol/L 4.3   Chloride 98 - 111 mmol/L 105   CO2 22 - 32 mmol/L 23   Calcium  8.9 - 10.3 mg/dL 8.3      Imaging/Diagnostic Tests:  Imaging confirms  HCC and choledocholithiasis  MR ABDOMEN MRCP W WO CONTAST Result Date: 01/04/2023 CLINICAL DATA:  Biliary obstruction. Liver metastasis suspected on ultrasound. EXAM: MRI ABDOMEN WITHOUT AND WITH CONTRAST (INCLUDING MRCP) TECHNIQUE: Multiplanar multisequence MR imaging of the abdomen was performed both before and after the  administration of intravenous contrast. Heavily T2-weighted images of the biliary and pancreatic ducts were obtained, and three-dimensional MRCP images were rendered by post processing. CONTRAST:  7mL GADAVIST  GADOBUTROL  1 MMOL/ML IV SOLN COMPARISON:  Ultrasound 12/30/2022 FINDINGS: Lower chest:  Lung bases are clear. Hepatobiliary: Multiple round enhancing lesions within LEFT and RIGHT hepatic lobe. Lesions reaches confluence in the lateral segment of LEFT hepatic lobe which is nearly complete replaced by enhancing lesions. These lesions demonstrate rapid early arterial enhancement with washout and peripheral pseudo capsules. (Series 1501) The confluent malignant lesions in the LEFT lateral hepatic lobe extend and expand the LEFT portal vein to 18 mm (image 43/series 1504). Findings consistent with tumor thrombus. This thrombus extends to the bifurcation of the main portal vein and partially involves the proximal RIGHT portal vein (image 48). There is biliary duct dilatation in the segment 4A of the liver. Gallbladder is distended to 58 mm. Several dependent gallstones are present. There several filling defects within distal common bile duct noted on coronal image 18/series 7. Filling defects stack at the distal common bile duct. The common bile duct is dilated to 11 mm. Several enlarged lymph nodes in the porta hepatis including 19 mm node on image 57/1502. Lymph node adjacent the head of the pancreas measures 11 mm image 73. Pancreas: Normal pancreatic parenchymal intensity. No ductal dilatation or inflammation. Spleen: Normal spleen. Adrenals/urinary tract: Adrenal glands and kidneys are normal. Stomach/Bowel: Stomach and limited of the small bowel is unremarkable Vascular/Lymphatic: Abdominal aortic normal caliber. No retroperitoneal periportal lymphadenopathy. Musculoskeletal: No aggressive osseous lesion IMPRESSION: 1. Multifocal bilobar hepatic malignant tumors. Lesions reach confluence within the LEFT  lateral hepatic lobe. 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 Enhancement pattern and portal vein tumor thrombosis are typical of hepatocellular carcinoma. 2. Porta hepatis lymphadenopathy. 3. Distal choledocholithiasis mild extrahepatic duct dilatation. Gallbladder distension. These results will be called to the ordering clinician or representative by the Radiologist Assistant, and communication documented in the PACS or [employer]. Electronically Signed   By: Jackquline Boxer M.D.   On: 01/04/2023 08:59   MR 3D Recon At Scanner Result Date: 01/04/2023 CLINICAL DATA:  Biliary obstruction. Liver metastasis suspected on ultrasound. EXAM: MRI ABDOMEN WITHOUT AND WITH CONTRAST (INCLUDING MRCP) TECHNIQUE: Multiplanar multisequence MR imaging of the abdomen was performed both before and after the administration of intravenous contrast. Heavily T2-weighted images of the biliary and pancreatic ducts were obtained, and three-dimensional MRCP images were rendered by post processing. CONTRAST:  7mL GADAVIST  GADOBUTROL  1 MMOL/ML IV SOLN COMPARISON:  Ultrasound 12/30/2022 FINDINGS: Lower chest:  Lung bases are clear. Hepatobiliary: Multiple round enhancing lesions within LEFT and RIGHT hepatic lobe. Lesions reaches confluence in the lateral segment of LEFT hepatic lobe which is nearly complete replaced by enhancing lesions. These lesions demonstrate rapid early arterial enhancement with washout and peripheral pseudo capsules. (Series 1501) The confluent malignant lesions in the LEFT lateral hepatic lobe extend and expand the LEFT portal vein to 18 mm (image 43/series 1504). Findings consistent with tumor thrombus. This thrombus extends to the bifurcation of the main portal vein and partially involves the proximal RIGHT portal  vein (image 48). There is biliary duct dilatation in the segment 4A of the liver.  Gallbladder is distended to 58 mm. Several dependent gallstones are present. There several filling defects within distal common bile duct noted on coronal image 18/series 7. Filling defects stack at the distal common bile duct. The common bile duct is dilated to 11 mm. Several enlarged lymph nodes in the porta hepatis including 19 mm node on image 57/1502. Lymph node adjacent the head of the pancreas measures 11 mm image 73. Pancreas: Normal pancreatic parenchymal intensity. No ductal dilatation or inflammation. Spleen: Normal spleen. Adrenals/urinary tract: Adrenal glands and kidneys are normal. Stomach/Bowel: Stomach and limited of the small bowel is unremarkable Vascular/Lymphatic: Abdominal aortic normal caliber. No retroperitoneal periportal lymphadenopathy. Musculoskeletal: No aggressive osseous lesion IMPRESSION: 1. Multifocal bilobar hepatic malignant tumors. Lesions reach confluence within the LEFT lateral hepatic lobe. 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 Enhancement pattern and portal vein tumor thrombosis are typical of hepatocellular carcinoma. 2. Porta hepatis lymphadenopathy. 3. Distal choledocholithiasis mild extrahepatic duct dilatation. Gallbladder distension. These results will be called to the ordering clinician or representative by the Radiologist Assistant, and communication documented in the PACS or [employer]. Electronically Signed   By: Jackquline Boxer M.D.   On: 01/04/2023 08:59    Jennelle Riis, MD 01/04/2023, 8:29 AM  PGY-3, [facility] FPTS Intern pager: [phone], text pages welcome Secure chat group [facility] Teaching Service

## 2023-01-05 ENCOUNTER — Inpatient Hospital Stay (HOSPITAL_COMMUNITY): Payer: Medicare HMO

## 2023-01-05 DIAGNOSIS — K819 Cholecystitis, unspecified: Secondary | ICD-10-CM | POA: Diagnosis not present

## 2023-01-05 DIAGNOSIS — R160 Hepatomegaly, not elsewhere classified: Secondary | ICD-10-CM

## 2023-01-05 LAB — CBC
HCT: 32.1 % — ABNORMAL LOW (ref 36.0–46.0)
Hemoglobin: 10 g/dL — ABNORMAL LOW (ref 12.0–15.0)
MCH: 27 pg (ref 26.0–34.0)
MCHC: 31.2 g/dL (ref 30.0–36.0)
MCV: 86.8 fL (ref 80.0–100.0)
Platelets: 297 10*3/uL (ref 150–400)
RBC: 3.7 MIL/uL — ABNORMAL LOW (ref 3.87–5.11)
RDW: 15.3 % (ref 11.5–15.5)
WBC: 8.7 10*3/uL (ref 4.0–10.5)
nRBC: 0 % (ref 0.0–0.2)

## 2023-01-05 LAB — BASIC METABOLIC PANEL
Anion gap: 12 (ref 5–15)
Anion gap: 9 (ref 5–15)
BUN: 22 mg/dL (ref 8–23)
BUN: 23 mg/dL (ref 8–23)
CO2: 22 mmol/L (ref 22–32)
CO2: 24 mmol/L (ref 22–32)
Calcium: 8.4 mg/dL — ABNORMAL LOW (ref 8.9–10.3)
Calcium: 8.3 mg/dL — ABNORMAL LOW (ref 8.9–10.3)
Chloride: 103 mmol/L (ref 98–111)
Chloride: 103 mmol/L (ref 98–111)
Creatinine, Ser: 0.79 mg/dL (ref 0.44–1.00)
Creatinine, Ser: 0.94 mg/dL (ref 0.44–1.00)
GFR, Estimated: >60 mL/min (ref >60)
GFR, Estimated: >60 mL/min (ref >60)
Glucose, Bld: 139 mg/dL — ABNORMAL HIGH (ref 70–99)
Glucose, Bld: 205 mg/dL — ABNORMAL HIGH (ref 70–99)
Potassium: 3.6 mmol/L (ref 3.5–5.1)
Potassium: 3.8 mmol/L (ref 3.5–5.1)
Sodium: 137 mmol/L (ref 135–145)
Sodium: 136 mmol/L (ref 135–145)

## 2023-01-05 LAB — GLUCOSE, CAPILLARY
Glucose-Capillary: 148 mg/dL — ABNORMAL HIGH (ref 70–99)
Glucose-Capillary: 179 mg/dL — ABNORMAL HIGH (ref 70–99)
Glucose-Capillary: 169 mg/dL — ABNORMAL HIGH (ref 70–99)

## 2023-01-05 LAB — PHOSPHORUS
Phosphorus: 2.7 mg/dL (ref 2.5–4.6)
Phosphorus: 2.9 mg/dL (ref 2.5–4.6)

## 2023-01-05 LAB — MAGNESIUM
Magnesium: 2.1 mg/dL (ref 1.7–2.4)
Magnesium: 2.1 mg/dL (ref 1.7–2.4)

## 2023-01-05 LAB — HEPARIN LEVEL (UNFRACTIONATED)
Heparin Unfractionated: 0.13 [IU]/mL — ABNORMAL LOW (ref 0.30–0.70)
Heparin Unfractionated: <0.1 [IU]/mL — ABNORMAL LOW (ref 0.30–0.70)

## 2023-01-05 LAB — VITAMIN D 25 HYDROXY (VIT D DEFICIENCY, FRACTURES): Vit D, 25-Hydroxy: 96.95 ng/mL (ref 30–100)

## 2023-01-05 MED ORDER — SPIRONOLACTONE 25 MG PO TABS
25.0000 mg | ORAL_TABLET | Freq: Every day | ORAL | Status: DC
  Administered 2023-01-05 – 2023-01-06 (×2): 25 mg via ORAL
  Filled 2023-01-05 (×2): qty 1

## 2023-01-05 MED ORDER — IRBESARTAN 300 MG PO TABS
300.0000 mg | ORAL_TABLET | Freq: Every day | ORAL | Status: DC
  Administered 2023-01-05 – 2023-01-06 (×2): 300 mg via ORAL
  Filled 2023-01-05 (×2): qty 1

## 2023-01-05 MED ORDER — IOHEXOL 350 MG/ML SOLN
50.0000 mL | Freq: Once | INTRAVENOUS | Status: AC | PRN
  Administered 2023-01-05: 50 mL via INTRAVENOUS

## 2023-01-05 MED ORDER — HEPARIN BOLUS VIA INFUSION
2000.0000 [IU] | Freq: Once | INTRAVENOUS | Status: AC
  Administered 2023-01-05: 2000 [IU] via INTRAVENOUS
  Filled 2023-01-05: qty 2000

## 2023-01-05 MED ORDER — APIXABAN 5 MG PO TABS
10.0000 mg | ORAL_TABLET | Freq: Two times a day (BID) | ORAL | Status: DC
  Administered 2023-01-05 – 2023-01-06 (×2): 10 mg via ORAL
  Filled 2023-01-05 (×2): qty 2

## 2023-01-05 MED ORDER — APIXABAN 5 MG PO TABS: 5.0000 mg | ORAL_TABLET | Freq: Two times a day (BID) | ORAL | Status: DC

## 2023-01-05 NOTE — Plan of Care (Signed)
 Spoke with Dr. Loretha with oncology about anticoagulation plan.  She reports patient is okay to follow-up in outpatient clinic but no planned procedure at this time.  If AFP is normal they may consider liver biopsy at a later date but would be dependent on patient's goals of care.  Currently on heparin , okay to transition to Eliquis  for continued outpatient management of portal vein thrombosis.   Heather Pagan, MD 01/05/2023, 4:52 PM PGY-2, [facility] Family Medicine Service pager [phone]

## 2023-01-05 NOTE — Progress Notes (Signed)
 PHARMACY - ANTICOAGULATION CONSULT NOTE  Pharmacy Consult for heparin  > Eliquis  Indication:  portal vein thrombosis  Allergies  Allergen Reactions   Hctz [Hydrochlorothiazide]     Per GI may have caused pancreatitis. Dr. Teressa recommended never resuming.   Lisinopril      H/O pancreatitis.    Patient Measurements: Heparin  Dosing Weight: 61.7kg   Vital Signs: Temp: 98.2 F (36.8 C) (01/01 1618) Temp Source: Oral (01/01 1618) BP: 127/52 (01/01 1618) Pulse Rate: 71 (01/01 1618)  Labs: Recent Labs    01/03/23 0830 01/04/23 0358 01/04/23 1757 01/04/23 2049 01/05/23 0808 01/05/23 1520  HGB 10.8* 9.9*  --   --  10.0*  --   HCT 36.5 32.0*  --   --  32.1*  --   PLT 312 266  --   --  297  --   HEPARINUNFRC  --   --   --  <0.10* 0.13* <0.10*  CREATININE 0.97 0.94 1.11*  --  0.79  --     Estimated Creatinine Clearance: 44.8 mL/min (by C-G formula based on SCr of 0.79 mg/dL).   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 Patient not on anticoagulation PTA. Initially started on heparin  gtt in anticipation of procedure while admitted, however, oncology has determined no planned procedure at this time. Okay to transition to Eliquis .  No s/sx of bleeding per RN.  Goal of Therapy:  Heparin  level 0.3-0.7 units/ml Monitor platelets by anticoagulation protocol: Yes   Plan:  Stop heparin  gtt at time  of first dose of Eliquis  Start Eliquis  10mg  PO BID x 7 days, then 5mg  PO BID F/u copay check in AM Pharmacy will continue to monitor peripherally  Harlene Mandril, PharmD PGY1 Pharmacy Resident 01/05/2023 4:26 PM

## 2023-01-05 NOTE — Assessment & Plan Note (Addendum)
 RD following and concerned for severe malnutrition/refeeding syndrome. Will obtain K, Mg, Ph labs BID.  AM K, mag, Phos labs without evidence of refeeding syndrome. - RD following, appreciate recommendations - BMP, Mg, Phos BID x 3 days (12/31 - 1/2)

## 2023-01-05 NOTE — Assessment & Plan Note (Addendum)
 Evidence of HCC on imaging.  Transaminitis has been stable since admission, likely elevated in the setting of liver pathology and/or choledocholithiasis.  Dr. Loretha, Oncology to see today. Pt will need washout time for Plavix  before any procedure may be attempted.  AFP ordered. - GI is following, appreciate recommendations - Consult placed to oncology, appreciate recommendations - Follow-up AFP

## 2023-01-05 NOTE — Assessment & Plan Note (Deleted)
 Has been stable since admission.  Likely elevated in setting of liver pathology and choledocholithiasis.  Awaiting AM labs today*** -CTM -AM CMP

## 2023-01-05 NOTE — Plan of Care (Addendum)
 Spoke with LPN Clydie Braun via secure chat regarding pt voiding as none has been documented. She states that she received report of pt getting up to use the restroom throughout the day. Will continue to monitor overnight.

## 2023-01-05 NOTE — Plan of Care (Signed)
  Problem: Education: Goal: Knowledge of General Education information will improve Description: Including pain rating scale, medication(s)/side effects and non-pharmacologic comfort measures Outcome: Progressing   Problem: Pain Management: Goal: General experience of comfort will improve Outcome: Progressing

## 2023-01-05 NOTE — Consult Note (Signed)
 [facility]  [phone]) [phone] [phone]   MEDICAL ONCOLOGY - INITIAL CONSULTATION   Referral MD  Reason for Referral: [facility]  Chief Complaint  Patient presents with   Weakness    HPI:   This is a pleasant 83 year old female patient with past medical history significant for CVA, IBS, hypertension, COPD who was admitted with chief complaint of abdominal pain, loss of appetite, poor p.o. intake and fatigue which has been ongoing for the past 6 months.  Family says several days prior to the hospitalization, she was independent although getting weaker in the past 6 months and driving.  She had evaluation while she is in the hospital, initial working diagnosis was cholelithiasis with possible cholecystitis however there were some liver lesions noted on the ultrasound, she had MRCP which confirmed portal vein thrombosis as well as multifocal hepatic masses, bilobar.  Tumor invades into the portal vein with large tumor thrombus within the left portal vein and early tumor thrombus involvement of the right portal vein.  Enhancement pattern and portal vein tumor thrombosis are typical of hepatocellular carcinoma.  There is also porta hepatis lymphadenopathy.  There is no imaging of the chest done.  AFP pending.  She was not very engaged in a conversation, she is understandably exhausted and shocked by the findings so far.  Daughter and son-in-law by bedside. 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   Qualifier: Diagnosis of  By: Burnis Pulling     Pancreatitis    Transient cerebral ischemia 05/08/2008   Qualifier: Diagnosis of  By: Burnis Pulling    :   Past Surgical History:  Procedure Laterality Date    COLONOSCOPY  09/07/2007   RMR: anal canal/external hemorrhoids, redundant colon, left-sided diverticula, otherwise normal colonic mucosa  :   Current Facility-Administered Medications  Medication Dose Route Frequency Provider Last Rate Last Admin   (feeding supplement) PROSource Plus liquid 30 mL  30 mL Oral BID BM Pray, Margaret E, MD       acetaminophen  (TYLENOL ) tablet 650 mg  650 mg Oral Q6H PRN Lafe Domino, DO   650 mg at 01/03/23 2140   Or   acetaminophen  (TYLENOL ) suppository 650 mg  650 mg Rectal Q6H PRN Lafe Domino, DO       amLODipine  (NORVASC ) tablet 10 mg  10 mg Oral Daily Lafe Domino, DO   10 mg at 01/05/23 9060   atorvastatin  (LIPITOR) tablet 40 mg  40 mg Oral Daily Lafe Domino, DO   40 mg at 01/05/23 9060   bisoprolol  (ZEBETA ) tablet 7.5 mg  7.5 mg Oral Daily Lafe Domino, DO   7.5 mg at 01/05/23 9061   cloNIDine  (CATAPRES ) tablet 0.2 mg  0.2 mg Oral BID Lafe Domino, DO   0.2 mg at 01/05/23 9061   dorzolamide -timolol  (COSOPT ) 2-0.5 % ophthalmic solution 1 drop  1 drop Both Eyes BID Lafe Domino, DO   1 drop at 01/05/23 9060   feeding supplement (BOOST / RESOURCE BREEZE) liquid 1 Container  1 Container Oral TID BM Donzetta Rollene BRAVO, MD   1 Container at 01/05/23 9057   heparin  ADULT infusion 100 units/mL (25000 units/250mL)  1,100 Units/hr Intravenous Continuous Clair Agent  L, RPH 11 mL/hr at 01/05/23 0617 1,100 Units/hr at 01/05/23 0617   hydrALAZINE  (APRESOLINE ) tablet 100 mg  100 mg Oral TID Lafe Domino, DO   100 mg at 01/05/23 9060   mirtazapine  (REMERON ) tablet 15 mg  15 mg Oral QHS Lafe Domino, DO   15 mg at 01/04/23 2103   multivitamin with minerals tablet 1 tablet  1 tablet Oral Daily Pray, Margaret E, MD   1 tablet at 01/04/23 1136   ondansetron  (ZOFRAN -ODT) disintegrating tablet 4 mg  4 mg Oral Q8H PRN Lafe Domino, DO   4 mg at 01/05/23 1149   pantoprazole  (PROTONIX ) EC tablet 40 mg  40 mg Oral Daily Lafe Domino, DO   40 mg at 01/05/23 0939    polyethylene glycol (MIRALAX  / GLYCOLAX ) packet 17 g  17 g Oral Daily PRN Lafe Domino, DO       thiamine  (VITAMIN B1) tablet 100 mg  100 mg Oral Daily Pray, Margaret E, MD   100 mg at 01/05/23 9060      Allergies  Allergen Reactions   Hctz [Hydrochlorothiazide]     Per GI may have caused pancreatitis. Dr. Teressa recommended never resuming.   Lisinopril      H/O pancreatitis.  :   Family History  Problem Relation Age of Onset   Hypertension Mother    Heart attack Father    Hypertension Father    Hypertension Sister    Hypertension Brother    Hypertension Brother    Diabetes Son    Hypertension Son    Colon cancer Neg Hx   :   Social History   Socioeconomic History   Marital status: Widowed    Spouse name: Not on file   Number of children: 3   Years of education: Not on file   Highest education level: Not on file  Occupational History   Occupation: retired  Tobacco Use   Smoking status: Former    Current packs/day: 0.00    Types: Cigarettes    Start date: 11/26/1997    Quit date: 11/27/2006    Years since quitting: 16.1   Smokeless tobacco: Never  Vaping Use   Vaping status: Never Used  Substance and Sexual Activity   Alcohol use: No    Alcohol/week: 0.0 standard drinks of alcohol   Drug use: No   Sexual activity: Not Currently  Other Topics Concern   Not on file  Social History Narrative   Three children.  All live nearby.   Social Drivers of Corporate Investment Banker Strain: Low Risk  (12/07/2022)   Overall Financial Resource Strain (CARDIA)    Difficulty of Paying Living Expenses: Not hard at all  Food Insecurity: No Food Insecurity (01/04/2023)   Hunger Vital Sign    Worried About Running Out of Food in the Last Year: Never true    Ran Out of Food in the Last Year: Never true  Transportation Needs: No Transportation Needs (01/04/2023)   PRAPARE - Administrator, Civil Service (Medical): No    Lack of Transportation (Non-Medical):  No  Physical Activity: Insufficiently Active (12/07/2022)   Exercise Vital Sign    Days of Exercise per Week: 3 days    Minutes of Exercise per Session: 30 min  Stress: No Stress Concern Present (12/07/2022)   [employer] of Occupational Health - Occupational Stress Questionnaire    Feeling of Stress : Not at all  Social Connections: Socially Isolated (01/04/2023)   Social Connection and  Isolation Panel [NHANES]    Frequency of Communication with Friends and Family: Never    Frequency of Social Gatherings with Friends and Family: Never    Attends Religious Services: Never    Database Administrator or Organizations: No    Attends Banker Meetings: Never    Marital Status: Widowed  Intimate Partner Violence: Not At Risk (01/04/2023)   Humiliation, Afraid, Rape, and Kick questionnaire    Fear of Current or Ex-Partner: No    Emotionally Abused: No    Physically Abused: No    Sexually Abused: No  :   Exam: Patient Vitals for the past 24 hrs:  BP Temp Temp src Pulse Resp SpO2  01/05/23 0938 (!) 154/94 -- -- -- -- --  01/05/23 0819 (!) 154/94 98.2 F (36.8 C) Oral 79 16 93 %  01/05/23 0406 (!) 141/56 98.6 F (37 C) Oral 72 14 93 %  01/04/23 2009 (!) 150/62 98.2 F (36.8 C) Oral 72 14 93 %  01/04/23 1551 (!) 121/52 (!) 97.5 F (36.4 C) -- 69 17 94 %    General: Appears frail, cachectic and icteric.  Lab Results  Component Value Date   WBC 8.7 01/05/2023   HGB 10.0 (L) 01/05/2023   HCT 32.1 (L) 01/05/2023   PLT 297 01/05/2023   GLUCOSE 139 (H) 01/05/2023   CHOL 134 07/05/2022   TRIG 168 (H) 07/05/2022   HDL 30 (L) 07/05/2022   LDLCALC 75 07/05/2022   ALT 10 01/04/2023   AST 45 (H) 01/04/2023   NA 137 01/05/2023   K 3.6 01/05/2023   CL 103 01/05/2023   CREATININE 0.79 01/05/2023   BUN 22 01/05/2023   CO2 22 01/05/2023    MR ABDOMEN MRCP W WO CONTAST Result Date: 01/04/2023 CLINICAL DATA:  Biliary obstruction. Liver metastasis suspected on  ultrasound. EXAM: MRI ABDOMEN WITHOUT AND WITH CONTRAST (INCLUDING MRCP) TECHNIQUE: Multiplanar multisequence MR imaging of the abdomen was performed both before and after the administration of intravenous contrast. Heavily T2-weighted images of the biliary and pancreatic ducts were obtained, and three-dimensional MRCP images were rendered by post processing. CONTRAST:  7mL GADAVIST  GADOBUTROL  1 MMOL/ML IV SOLN COMPARISON:  Ultrasound 12/30/2022 FINDINGS: Lower chest:  Lung bases are clear. Hepatobiliary: Multiple round enhancing lesions within LEFT and RIGHT hepatic lobe. Lesions reaches confluence in the lateral segment of LEFT hepatic lobe which is nearly complete replaced by enhancing lesions. These lesions demonstrate rapid early arterial enhancement with washout and peripheral pseudo capsules. (Series 1501) The confluent malignant lesions in the LEFT lateral hepatic lobe extend and expand the LEFT portal vein to 18 mm (image 43/series 1504). Findings consistent with tumor thrombus. This thrombus extends to the bifurcation of the main portal vein and partially involves the proximal RIGHT portal vein (image 48). There is biliary duct dilatation in the segment 4A of the liver. Gallbladder is distended to 58 mm. Several dependent gallstones are present. There several filling defects within distal common bile duct noted on coronal image 18/series 7. Filling defects stack at the distal common bile duct. The common bile duct is dilated to 11 mm. Several enlarged lymph nodes in the porta hepatis including 19 mm node on image 57/1502. Lymph node adjacent the head of the pancreas measures 11 mm image 73. Pancreas: Normal pancreatic parenchymal intensity. No ductal dilatation or inflammation. Spleen: Normal spleen. Adrenals/urinary tract: Adrenal glands and kidneys are normal. Stomach/Bowel: Stomach and limited of the small bowel is unremarkable Vascular/Lymphatic: Abdominal aortic normal  caliber. No retroperitoneal  periportal lymphadenopathy. Musculoskeletal: No aggressive osseous lesion IMPRESSION: 1. Multifocal bilobar hepatic malignant tumors. Lesions reach confluence within the LEFT lateral hepatic lobe. 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 Enhancement pattern and portal vein tumor thrombosis are typical of hepatocellular carcinoma. 2. Porta hepatis lymphadenopathy. 3. Distal choledocholithiasis mild extrahepatic duct dilatation. Gallbladder distension. These results will be called to the ordering clinician or representative by the Radiologist Assistant, and communication documented in the PACS or [employer]. Electronically Signed   By: Jackquline Boxer M.D.   On: 01/04/2023 08:59   MR 3D Recon At Scanner Result Date: 01/04/2023 CLINICAL DATA:  Biliary obstruction. Liver metastasis suspected on ultrasound. EXAM: MRI ABDOMEN WITHOUT AND WITH CONTRAST (INCLUDING MRCP) TECHNIQUE: Multiplanar multisequence MR imaging of the abdomen was performed both before and after the administration of intravenous contrast. Heavily T2-weighted images of the biliary and pancreatic ducts were obtained, and three-dimensional MRCP images were rendered by post processing. CONTRAST:  7mL GADAVIST  GADOBUTROL  1 MMOL/ML IV SOLN COMPARISON:  Ultrasound 12/30/2022 FINDINGS: Lower chest:  Lung bases are clear. Hepatobiliary: Multiple round enhancing lesions within LEFT and RIGHT hepatic lobe. Lesions reaches confluence in the lateral segment of LEFT hepatic lobe which is nearly complete replaced by enhancing lesions. These lesions demonstrate rapid early arterial enhancement with washout and peripheral pseudo capsules. (Series 1501) The confluent malignant lesions in the LEFT lateral hepatic lobe extend and expand the LEFT portal vein to 18 mm (image 43/series 1504). Findings consistent with tumor thrombus. This thrombus extends to  the bifurcation of the main portal vein and partially involves the proximal RIGHT portal vein (image 48). There is biliary duct dilatation in the segment 4A of the liver. Gallbladder is distended to 58 mm. Several dependent gallstones are present. There several filling defects within distal common bile duct noted on coronal image 18/series 7. Filling defects stack at the distal common bile duct. The common bile duct is dilated to 11 mm. Several enlarged lymph nodes in the porta hepatis including 19 mm node on image 57/1502. Lymph node adjacent the head of the pancreas measures 11 mm image 73. Pancreas: Normal pancreatic parenchymal intensity. No ductal dilatation or inflammation. Spleen: Normal spleen. Adrenals/urinary tract: Adrenal glands and kidneys are normal. Stomach/Bowel: Stomach and limited of the small bowel is unremarkable Vascular/Lymphatic: Abdominal aortic normal caliber. No retroperitoneal periportal lymphadenopathy. Musculoskeletal: No aggressive osseous lesion IMPRESSION: 1. Multifocal bilobar hepatic malignant tumors. Lesions reach confluence within the LEFT lateral hepatic lobe. 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 Enhancement pattern and portal vein tumor thrombosis are typical of hepatocellular carcinoma. 2. Porta hepatis lymphadenopathy. 3. Distal choledocholithiasis mild extrahepatic duct dilatation. Gallbladder distension. These results will be called to the ordering clinician or representative by the Radiologist Assistant, and communication documented in the PACS or [employer]. Electronically Signed   By: Jackquline Boxer M.D.   On: 01/04/2023 08:59   US  Abdomen Limited RUQ (LIVER/GB) Result Date: 01/03/2023 CLINICAL DATA:  151471 RUQ pain 151471 EXAM: ULTRASOUND ABDOMEN LIMITED RIGHT UPPER QUADRANT COMPARISON:  RIGHT upper quadrant ultrasound, 12/30/2022. CT AP,  12/25/2018. FINDINGS: Gallbladder: 1.3 cm dependent echogenic gallstone within a mildly distended gallbladder. Gallbladder wall measures greater than the upper level normal thickness, at 0.4 cm. Trace pericholecystic fluid. A POSITIVE sonographic Beverley sign was reported  by sonographer. Common bile duct: Diameter: Distended, measuring up to 0.9 cm. Liver: Increased hepatic echogenicity, with a similar appearance of multifocal heterogeneously-echogenic hepatic masses, greatest within the LEFT hepatic lobe and measuring up to 5 cm. See key image. 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 The main portal vein appears patent on color Doppler imaging with normal direction of blood flow towards the liver. Other: No perihepatic free fluid. IMPRESSION: 1. Cholelithiasis with sonographic findings highly suspicious for acute calculus cholecystitis. 2. Common bile duct distention, suspicious for choledocholithiasis. Correlate with MRCP and obstructive liver enzymes. 3. Multifocal hepatic masses, largest within the LEFT lobe measuring up to 5 cm. These are incompletely characterized on this evaluation. Recommend dedicated, contrasted MR abdomen for further evaluation. These results will be called to the ordering clinician or representative by the Radiologist Assistant, and communication documented in the PACS or [employer]. Electronically Signed   By: Thom Hall M.D.   On: 01/03/2023 12:31   US  Abdomen Limited RUQ (LIVER/GB) Result Date: 12/30/2022 CLINICAL DATA:  Right upper quadrant pain and nausea for 3 weeks. EXAM: ULTRASOUND ABDOMEN LIMITED RIGHT UPPER QUADRANT COMPARISON:  CT on 12/25/2018 FINDINGS: Gallbladder: Gallbladder is poorly distended which limits evaluation. Probable 8 mm gallstone noted. Diffuse gallbladder wall thickening is seen which may be due to underdistention, although other possibilities include cholecystitis, hepatocellular disease, and pancreatitis. No sonographic  Murphy sign noted by sonographer. Common bile duct: Diameter: 11 mm, which is mildly dilated. Liver: Multiple hepatic masses are seen which show variable echogenicity. Largest in the left lobe measures 4.8 cm. These are highly suspicious for liver metastases. Portal vein is patent on color Doppler imaging with normal direction of blood flow towards the liver. Other: None. IMPRESSION: Multiple hepatic masses, highly suspicious for liver metastases. Recommend abdomen MRI without and with contrast for further characterization. Nondistended gallbladder with probable cholelithiasis. Diffuse gallbladder wall thickening is nonspecific, and may be due to underdistention, although other considerations include cholecystitis, hepatocellular disease, and pancreatitis. Mildly dilated common bile duct, measuring 11 mm. This could also be further evaluated by MRI/MRCP. Electronically Signed   By: Norleen DELENA Kil M.D.   On: 12/30/2022 11:29    Pathology: Not applicable  MR ABDOMEN MRCP W WO CONTAST Result Date: 01/04/2023 CLINICAL DATA:  Biliary obstruction. Liver metastasis suspected on ultrasound. EXAM: MRI ABDOMEN WITHOUT AND WITH CONTRAST (INCLUDING MRCP) TECHNIQUE: Multiplanar multisequence MR imaging of the abdomen was performed both before and after the administration of intravenous contrast. Heavily T2-weighted images of the biliary and pancreatic ducts were obtained, and three-dimensional MRCP images were rendered by post processing. CONTRAST:  7mL GADAVIST  GADOBUTROL  1 MMOL/ML IV SOLN COMPARISON:  Ultrasound 12/30/2022 FINDINGS: Lower chest:  Lung bases are clear. Hepatobiliary: Multiple round enhancing lesions within LEFT and RIGHT hepatic lobe. Lesions reaches confluence in the lateral segment of LEFT hepatic lobe which is nearly complete replaced by enhancing lesions. These lesions demonstrate rapid early arterial enhancement with washout and peripheral pseudo capsules. (Series 1501) The confluent malignant lesions  in the LEFT lateral hepatic lobe extend and expand the LEFT portal vein to 18 mm (image 43/series 1504). Findings consistent with tumor thrombus. This thrombus extends to the bifurcation of the main portal vein and partially involves the proximal RIGHT portal vein (image 48). There is biliary duct dilatation in the segment 4A of the liver. Gallbladder is distended to 58 mm. Several dependent gallstones are present. There several filling defects within distal common bile duct noted on coronal image 18/series  7. Filling defects stack at the distal common bile duct. The common bile duct is dilated to 11 mm. Several enlarged lymph nodes in the porta hepatis including 19 mm node on image 57/1502. Lymph node adjacent the head of the pancreas measures 11 mm image 73. Pancreas: Normal pancreatic parenchymal intensity. No ductal dilatation or inflammation. Spleen: Normal spleen. Adrenals/urinary tract: Adrenal glands and kidneys are normal. Stomach/Bowel: Stomach and limited of the small bowel is unremarkable Vascular/Lymphatic: Abdominal aortic normal caliber. No retroperitoneal periportal lymphadenopathy. Musculoskeletal: No aggressive osseous lesion IMPRESSION: 1. Multifocal bilobar hepatic malignant tumors. Lesions reach confluence within the LEFT lateral hepatic lobe. 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 Enhancement pattern and portal vein tumor thrombosis are typical of hepatocellular carcinoma. 2. Porta hepatis lymphadenopathy. 3. Distal choledocholithiasis mild extrahepatic duct dilatation. Gallbladder distension. These results will be called to the ordering clinician or representative by the Radiologist Assistant, and communication documented in the PACS or [employer]. Electronically Signed   By: Jackquline Boxer M.D.   On: 01/04/2023 08:59   MR 3D Recon At Scanner Result Date:  01/04/2023 CLINICAL DATA:  Biliary obstruction. Liver metastasis suspected on ultrasound. EXAM: MRI ABDOMEN WITHOUT AND WITH CONTRAST (INCLUDING MRCP) TECHNIQUE: Multiplanar multisequence MR imaging of the abdomen was performed both before and after the administration of intravenous contrast. Heavily T2-weighted images of the biliary and pancreatic ducts were obtained, and three-dimensional MRCP images were rendered by post processing. CONTRAST:  7mL GADAVIST  GADOBUTROL  1 MMOL/ML IV SOLN COMPARISON:  Ultrasound 12/30/2022 FINDINGS: Lower chest:  Lung bases are clear. Hepatobiliary: Multiple round enhancing lesions within LEFT and RIGHT hepatic lobe. Lesions reaches confluence in the lateral segment of LEFT hepatic lobe which is nearly complete replaced by enhancing lesions. These lesions demonstrate rapid early arterial enhancement with washout and peripheral pseudo capsules. (Series 1501) The confluent malignant lesions in the LEFT lateral hepatic lobe extend and expand the LEFT portal vein to 18 mm (image 43/series 1504). Findings consistent with tumor thrombus. This thrombus extends to the bifurcation of the main portal vein and partially involves the proximal RIGHT portal vein (image 48). There is biliary duct dilatation in the segment 4A of the liver. Gallbladder is distended to 58 mm. Several dependent gallstones are present. There several filling defects within distal common bile duct noted on coronal image 18/series 7. Filling defects stack at the distal common bile duct. The common bile duct is dilated to 11 mm. Several enlarged lymph nodes in the porta hepatis including 19 mm node on image 57/1502. Lymph node adjacent the head of the pancreas measures 11 mm image 73. Pancreas: Normal pancreatic parenchymal intensity. No ductal dilatation or inflammation. Spleen: Normal spleen. Adrenals/urinary tract: Adrenal glands and kidneys are normal. Stomach/Bowel: Stomach and limited of the small bowel is  unremarkable Vascular/Lymphatic: Abdominal aortic normal caliber. No retroperitoneal periportal lymphadenopathy. Musculoskeletal: No aggressive osseous lesion IMPRESSION: 1. Multifocal bilobar hepatic malignant tumors. Lesions reach confluence within the LEFT lateral hepatic lobe. 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 Enhancement pattern and portal vein tumor thrombosis are typical of hepatocellular carcinoma. 2. Porta hepatis lymphadenopathy. 3. Distal choledocholithiasis mild extrahepatic duct dilatation. Gallbladder distension. These results will be called to the ordering clinician or representative by the Radiologist Assistant, and communication documented in the PACS or [employer]. Electronically Signed  By: Jackquline Boxer M.D.   On: 01/04/2023 08:59   US  Abdomen Limited RUQ (LIVER/GB) Result Date: 01/03/2023 CLINICAL DATA:  151471 RUQ pain 151471 EXAM: ULTRASOUND ABDOMEN LIMITED RIGHT UPPER QUADRANT COMPARISON:  RIGHT upper quadrant ultrasound, 12/30/2022. CT AP, 12/25/2018. FINDINGS: Gallbladder: 1.3 cm dependent echogenic gallstone within a mildly distended gallbladder. Gallbladder wall measures greater than the upper level normal thickness, at 0.4 cm. Trace pericholecystic fluid. A POSITIVE sonographic Beverley sign was reported by sonographer. Common bile duct: Diameter: Distended, measuring up to 0.9 cm. Liver: Increased hepatic echogenicity, with a similar appearance of multifocal heterogeneously-echogenic hepatic masses, greatest within the LEFT hepatic lobe and measuring up to 5 cm. See key image. 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 The main portal vein appears patent on color Doppler imaging with normal direction of blood flow towards the liver. Other: No perihepatic free fluid. IMPRESSION: 1. Cholelithiasis with sonographic findings highly suspicious  for acute calculus cholecystitis. 2. Common bile duct distention, suspicious for choledocholithiasis. Correlate with MRCP and obstructive liver enzymes. 3. Multifocal hepatic masses, largest within the LEFT lobe measuring up to 5 cm. These are incompletely characterized on this evaluation. Recommend dedicated, contrasted MR abdomen for further evaluation. These results will be called to the ordering clinician or representative by the Radiologist Assistant, and communication documented in the PACS or [employer]. Electronically Signed   By: Thom Hall M.D.   On: 01/03/2023 12:31   US  Abdomen Limited RUQ (LIVER/GB) Result Date: 12/30/2022 CLINICAL DATA:  Right upper quadrant pain and nausea for 3 weeks. EXAM: ULTRASOUND ABDOMEN LIMITED RIGHT UPPER QUADRANT COMPARISON:  CT on 12/25/2018 FINDINGS: Gallbladder: Gallbladder is poorly distended which limits evaluation. Probable 8 mm gallstone noted. Diffuse gallbladder wall thickening is seen which may be due to underdistention, although other possibilities include cholecystitis, hepatocellular disease, and pancreatitis. No sonographic Murphy sign noted by sonographer. Common bile duct: Diameter: 11 mm, which is mildly dilated. Liver: Multiple hepatic masses are seen which show variable echogenicity. Largest in the left lobe measures 4.8 cm. These are highly suspicious for liver metastases. Portal vein is patent on color Doppler imaging with normal direction of blood flow towards the liver. Other: None. IMPRESSION: Multiple hepatic masses, highly suspicious for liver metastases. Recommend abdomen MRI without and with contrast for further characterization. Nondistended gallbladder with probable cholelithiasis. Diffuse gallbladder wall thickening is nonspecific, and may be due to underdistention, although other considerations include cholecystitis, hepatocellular disease, and pancreatitis. Mildly dilated common bile duct, measuring 11 mm. This could also be  further evaluated by MRI/MRCP. Electronically Signed   By: Norleen DELENA Kil M.D.   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  Oncology consulted to discuss recommendations.  She is not a surgical candidate not a transplant candidate.  We can present her in the GI MDC to see if she is a candidate for any local treatments.  She does not appear to be a great candidate for systemic therapy however we can certainly consider this if she is willing to try some treatment.  I have discussed with her that systemic therapy may not be curative but may improve symptoms and quality of life if  she tolerates it well.  She is willing to consider being presented in the [facility] and follow-up with them outpatient.  In the interim please consider CT chest imaging to complete staging Also add INR to calculate child Pugh score.   The length of time of the face-to-face encounter was 55 minutes. More than 50% of time was spent counseling and coordination of care.     Thank you for this referral.

## 2023-01-05 NOTE — TOC Initial Note (Signed)
 Transition of Care (TOC) - Initial/Assessment Note   Spoke to patient and daughter at bedside.   Confirmed face sheet information.   Patient from home alone. Family working on providing assistance at discharge.   Discussed PT recommendations HHPT and tub /shower bench.   Patient already has a tub /shower bench at home.   Provided medicare.gov list of home health agencies with ratings. Patient deferred decision to daughter. Daughter will call NCM once they have decided on agency. They are aware NCM will need to confirm agency in network with patient's insurance and has staffing.  Patient Details  Name: Heather Oconnor MRN: [id number] Date of Birth: 1940-07-01  Transition of Care [facility]) CM/SW Contact:    Stephane Powell Jansky, RN Phone Number: 01/05/2023, 11:01 AM  Clinical Narrative:                   Expected Discharge Plan: Home w Home Health Services Barriers to Discharge: Continued Medical Work up   Patient Goals and CMS Choice Patient states their goals for this hospitalization and ongoing recovery are:: to return to home CMS Medicare.gov Compare Post Acute Care list provided to:: Patient Represenative (must comment) (daughter Ginger) Choice offered to / list presented to : Patient, Adult Children      Expected Discharge Plan and Services   Discharge Planning Services: CM Consult Post Acute Care Choice: Home Health Living arrangements for the past 2 months: Single Family Home                 DME Arranged: N/A DME Agency: NA       HH Arranged: PT          Prior Living Arrangements/Services Living arrangements for the past 2 months: Single Family Home Lives with:: Self Patient language and need for interpreter reviewed:: Yes Do you feel safe going back to the place where you live?: Yes      Need for Family Participation in Patient Care: Yes (Comment) Care giver support system in place?: Yes (comment) (daughter stated they are working on that now) Current  home services: DME Criminal Activity/Legal Involvement Pertinent to Current Situation/Hospitalization: No - Comment as needed  Activities of Daily Living      Permission Sought/Granted   Permission granted to share information with : Yes, Verbal Permission Granted  Share Information with NAME: daughter [name]  Permission granted to share info w AGENCY: home health        Emotional Assessment   Attitude/Demeanor/Rapport: Engaged Affect (typically observed): Accepting Orientation: : Oriented to Self, Oriented to Place, Oriented to  Time, Oriented to Situation Alcohol / Substance Use: Not Applicable Psych Involvement: No (comment)  Admission diagnosis:  Acute cholecystitis [K81.0] Choledocholithiasis [K80.50] Cholecystitis [K81.9] Patient Active Problem List   Diagnosis Date Noted   Liver masses 01/04/2023   Choledocholithiasis 01/04/2023   Antiplatelet or antithrombotic long-term use 01/04/2023   Protein-calorie malnutrition, severe 01/04/2023   Cholecystitis 01/03/2023   Transaminitis 01/03/2023   Lesion of liver 01/03/2023   Abnormal liver diagnostic imaging 01/03/2023   Gallstones 01/03/2023   Portal vein thrombosis 01/03/2023   Weight loss 01/03/2023   Fecal incontinence 07/19/2022   Hypertension associated with diabetes (HCC) 01/06/2022   Stage 3b chronic kidney disease (CKD) (HCC) 01/06/2022   Diabetic nephropathy (HCC) 02/04/2020   Atherosclerosis of aorta (HCC) 01/18/2020   Difficulty sleeping 01/18/2020   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   Murmur 03/19/2019   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   B12 deficiency 07/05/2018  Chronic diastolic CHF (congestive heart failure) (HCC) 07/04/2018   COPD without exacerbation (HCC) 07/04/2018   Type 2 diabetes mellitus with hyperglycemia, without long-term current use of insulin  (HCC) 06/12/2018   Elevated LFTs/Needs Liver Biopsy    Irritable bowel syndrome 02/04/2015   Hyperlipidemia associated with  type 2 diabetes mellitus (HCC) 09/11/2013   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   History of CVA in adulthood 05/08/2008   INTERNAL HEMORRHOIDS 05/08/2008   HEMORRHOIDS, EXTERNAL 05/08/2008   GERD 05/08/2008   HIATAL HERNIA 05/08/2008   History of colonic polyps 05/08/2008   PCP:  [name] HERO, FNP Pharmacy:   [facility] [place], [place] - [address] [place] [address] Phone: [phone] Fax: [phone]  [facility] [address] [place] - [address] [place] [address] [place] [address] Phone: [phone] Fax: [phone]  [facility] - [place], [place] - [address] [place] [address] Phone: [phone] Fax: [phone]     Social Drivers of Health (SDOH) Social History: SDOH Screenings   Food Insecurity: No Food Insecurity (01/04/2023)  Housing: Low Risk  (01/04/2023)  Transportation Needs: No Transportation Needs (01/04/2023)  Utilities: Not At Risk (01/04/2023)  Alcohol Screen: Low Risk  (12/07/2022)  Depression (PHQ2-9): Low Risk  (12/07/2022)  Financial Resource Strain: Low Risk  (12/07/2022)  Physical Activity: Insufficiently Active (12/07/2022)  Social Connections: Socially Isolated (01/04/2023)  Stress: No Stress Concern Present (12/07/2022)  Tobacco Use: Medium Risk (01/03/2023)  Health Literacy: Adequate Health Literacy (12/07/2022)   SDOH Interventions:     Readmission Risk Interventions     No data to display

## 2023-01-05 NOTE — Assessment & Plan Note (Signed)
 Left portal vein thrombus seen on imaging, requiring anticoagulation. - Will adjust anticoagulation orders following oncology input as needed -IV heparin per pharmacy

## 2023-01-05 NOTE — Progress Notes (Signed)
 Daily Progress Note Intern Pager: [phone]  Patient name: Heather Oconnor Medical record number: [id number] Date of birth: 12-31-1940 Age: 83 y.o. Gender: female  Primary Care Provider: [name] HERO, FNP Consultants: GI, surgery (signed off) Code Status: DNR  Pt Overview and Major Events to Date:  01/03/2023-admitted, MRCP  Assessment and Plan: Heather Oconnor is an 83 year old with past medical history of resistant hypertension, CVA, CAD, T2DM, GERD, IBS who presents with abdominal pain.  Patient found to have choledocholithiasis/cholelithiasis does not appear obstructive and is without need for urgent surgical intervention.  Also found to have portal vein thrombosis and multifocal liver masses in the setting of cirrhosis.   Patient desires to be DNR at this time.  Had extensive discussion with patient this morning in which she expressed her desires and stated she was at peace.  She shared a story of a family member who passed from COVID in 2021 and stated that though she was sad she is also grateful that they are no longer suffering; she is able to relate this to her own condition currently and hopes that her family members will be equally understanding.  She plans to share this change in her wishes with them today. Assessment & Plan Choledocholithiasis MRCP confirms choledocholithiasis, no plans for surgical intervention at this time.  May consider ERCP pending plan for liver findings. Anticipate no procedure until plavix  washout complete. - Surgery has consulted, no indication for emergent surgery, appreciate recommendations and will reach back out as needed - Tylenol  every 6 hours as needed - AM CBC, CMP - PT/OT to treat Lesion of liver Evidence of HCC on imaging.  Transaminitis has been stable since admission, likely elevated in the setting of liver pathology and/or choledocholithiasis.  Dr. Loretha, Oncology to see today. Pt will need washout time for Plavix  before any  procedure may be attempted.  AFP ordered. - GI is following, appreciate recommendations - Consult placed to oncology, appreciate recommendations - Follow-up AFP Portal vein thrombosis Left portal vein thrombus seen on imaging, requiring anticoagulation. - Will adjust anticoagulation orders following oncology input as needed -IV heparin  per pharmacy Weight loss RD following and concerned for severe malnutrition/refeeding syndrome. Will obtain K, Mg, Ph labs BID.  AM K, mag, Phos labs without evidence of refeeding syndrome. - RD following, appreciate recommendations - BMP, Mg, Phos BID x 3 days (12/31 - 1/2)   Chronic and Stable Problems:  History of CVA-holding Plavix , ASA for now pending further procedures T2DM-holding home metformin ; CBGs 3 times daily and bedtime HTN-continue home amlodipine , bisoprolol , hydralazine , clonidine ; hold home spironolactone  for now HLD-continue home atorvastatin  GERD-continue home Protonix   FEN/GI: Regular diet PPx: IV heparin  Dispo:    pending clinical improvement .  Subjective:  Patient seen this a.m. resting in bed.  She has a tray of food at bedside but has been unable to sit up to eat it.  She states I am at peace.  She denies pain any other concerns at this time.  She has been able to use the bedside commode and is voiding on her own.  She does wish to be DNR, as above.  She is looking forward to speaking with the oncologist today.  Requests help getting more comfortable in bed so that she can try to eat, though she has no appetite currently.  Objective: Temp:  [97.5 F (36.4 C)-98.6 F (37 C)] 98.2 F (36.8 C) (01/01 0819) Pulse Rate:  [69-79] 79 (01/01 0819) Resp:  [14-17]  16 (01/01 0819) BP: (121-154)/(52-94) 154/94 (01/01 0938) SpO2:  [93 %-94 %] 93 % (01/01 0819) Physical Exam: General: Tired appearing, pleasant, NAD. Cardiovascular: RRR, no murmurs Respiratory: CTA bilaterally, no increased work of breathing on room air Abdomen:  Normoactive bowel sounds.  Soft, mildly tender throughout. Extremities: SCDs in place.  Moves all extremities equally.  Laboratory: Most recent CBC Lab Results  Component Value Date   WBC 8.7 01/05/2023   HGB 10.0 (L) 01/05/2023   HCT 32.1 (L) 01/05/2023   MCV 86.8 01/05/2023   PLT 297 01/05/2023   Most recent BMP    Latest Ref Rng & Units 01/05/2023    8:08 AM  BMP  Glucose 70 - 99 mg/dL 860   BUN 8 - 23 mg/dL 22   Creatinine 9.55 - 1.00 mg/dL 9.20   Sodium 864 - 854 mmol/L 137   Potassium 3.5 - 5.1 mmol/L 3.6   Chloride 98 - 111 mmol/L 103   CO2 22 - 32 mmol/L 22   Calcium  8.9 - 10.3 mg/dL 8.4     Lafe Domino, DO 01/05/2023, 11:42 AM  PGY-1, [facility] FPTS Intern pager: [phone], text pages welcome Secure chat group [facility] Teaching Service

## 2023-01-05 NOTE — Assessment & Plan Note (Signed)
 MRCP confirms choledocholithiasis, no plans for surgical intervention at this time.  May consider ERCP pending plan for liver findings. Anticipate no procedure until plavix  washout complete. - Surgery has consulted, no indication for emergent surgery, appreciate recommendations and will reach back out as needed - Tylenol  every 6 hours as needed - AM CBC, CMP - PT/OT to treat

## 2023-01-05 NOTE — Hospital Course (Addendum)
 Heather Oconnor is a 83 y.o.female with a history of resistant HTN, CVA, CAD, T2DM, GERD, IBS who was admitted to the [facility] Medicine Teaching Service at [facility] for abdominal pain. Her hospital course is detailed below:  Choledocholithiasis/cholelithiasis Initially presented with diffuse tenderness to palpation of abdomen and RUQ US  showed evidence of cholecystitis.  Surgery and GI were both consulted and patient proceeded with MRCP which showed choledocholithiasis, but no evidence that patient was obstructed, and as such did not require emergent surgical intervention.  Ultimately thought to be an incidental finding and her symptoms more likely attributable to tumor burden.  Liver lesions  HCC  malignant portal vein thrombosis Incidental finding of multifocal hepatic masses in the left liver lobe on RUQ US , thought to be most consistent with [facility] per radiology.  AFP level was pending at discharge.  Oncology was consulted and agreed that patient would be better served in the outpatient setting - no further inpatient workup indicated. Patient was transitioned from IV heparin  to Eliquis  for portal vein thrombosis.  Weight loss  severe malnutrition Family concerned about weight loss and significantly decreased appetite in recent weeks.  Nutrition was consulted during admission and advised nutritional supplement drinks and MVI.  Refeeding syndrome labs were collected twice daily without evidence of refeeding syndrome.  Other chronic conditions were medically managed with home medications and formulary alternatives as necessary (T2DM, HTN, HLD, GERD)   PCP Follow-up Recommendations: D/c ASA/Plavix  now that she is on Eliquis , as puts at increased risk for ICH Continue Eliquis  Be sure Patient to follow up w/ Oncology Patient to follow up with GI if desired for choledocholithiasis

## 2023-01-05 NOTE — Plan of Care (Signed)
  Problem: Education: ?Goal: Knowledge of General Education information will improve ?Description: Including pain rating scale, medication(s)/side effects and non-pharmacologic comfort measures ?Outcome: Progressing ?  ?Problem: Clinical Measurements: ?Goal: Will remain free from infection ?Outcome: Progressing ?  ?Problem: Activity: ?Goal: Risk for activity intolerance will decrease ?Outcome: Progressing ?  ?Problem: Coping: ?Goal: Level of anxiety will decrease ?Outcome: Progressing ?  ?Problem: Safety: ?Goal: Ability to remain free from injury will improve ?Outcome: Progressing ?  ?

## 2023-01-05 NOTE — Progress Notes (Signed)
 PHARMACY - ANTICOAGULATION CONSULT NOTE  Pharmacy Consult for heparin   Indication:  portal vein thrombosis  Allergies  Allergen Reactions   Hctz [Hydrochlorothiazide]     Per GI may have caused pancreatitis. Dr. Teressa recommended never resuming.   Lisinopril      H/O pancreatitis.    Patient Measurements:   Heparin  Dosing Weight: 61.7kg   Vital Signs: Temp: 98.6 F (37 C) (01/01 0406) Temp Source: Oral (01/01 0406) BP: 141/56 (01/01 0406) Pulse Rate: 72 (01/01 0406)  Labs: Recent Labs    01/03/23 0830 01/04/23 0358 01/04/23 1757 01/04/23 2049  HGB 10.8* 9.9*  --   --   HCT 36.5 32.0*  --   --   PLT 312 266  --   --   HEPARINUNFRC  --   --   --  <0.10*  CREATININE 0.97 0.94 1.11*  --     Estimated Creatinine Clearance: 32.3 mL/min (A) (by C-G formula based on SCr of 1.11 mg/dL (H)).   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 Patient not on anticoagulation PTA, HgB 9.9 and PLT 266. Pharmacy consulted to dose heparin  gtt. Potential ERCP towards the end of the week.   1/1 AM update:  Heparin  level sub-therapeutic   Goal of Therapy:  Heparin  level 0.3-0.7 units/ml Monitor platelets by anticoagulation protocol: Yes   Plan:  Heparin  2000 units bolus Inc heparin  to 1100 units/hr 1400 heparin  level  Lynwood Mckusick, PharmD, BCPS Clinical Pharmacist Phone: [phone]

## 2023-01-05 NOTE — Progress Notes (Signed)
 Mobility Specialist: Progress Note   01/05/23 1514  Mobility  Activity Ambulated with assistance in room  Level of Assistance Contact guard assist, steadying assist  Assistive Device Front wheel walker  Distance Ambulated (ft) 30 ft  Activity Response Tolerated well  Mobility Referral Yes  Mobility visit 1 Mobility  Mobility Specialist Start Time (ACUTE ONLY) 1500  Mobility Specialist Stop Time (ACUTE ONLY) 1511  Mobility Specialist Time Calculation (min) (ACUTE ONLY) 11 min    Pt was agreeable to mobility session - received in bed. Not feeling it today. CG throughout. Ambulated in the room without fault. Left in bed with all needs met, call bell in reach. Family and RN in room.   Ileana Lute Mobility Specialist Please contact via SecureChat or Rehab office at [phone]

## 2023-01-06 ENCOUNTER — Telehealth (HOSPITAL_COMMUNITY): Payer: Self-pay | Admitting: Pharmacy Technician

## 2023-01-06 ENCOUNTER — Other Ambulatory Visit (HOSPITAL_COMMUNITY): Payer: Self-pay

## 2023-01-06 ENCOUNTER — Ambulatory Visit: Payer: Medicare HMO | Admitting: Family Medicine

## 2023-01-06 DIAGNOSIS — Z7189 Other specified counseling: Secondary | ICD-10-CM

## 2023-01-06 DIAGNOSIS — K8020 Calculus of gallbladder without cholecystitis without obstruction: Secondary | ICD-10-CM | POA: Diagnosis not present

## 2023-01-06 DIAGNOSIS — C220 Liver cell carcinoma: Secondary | ICD-10-CM

## 2023-01-06 DIAGNOSIS — K589 Irritable bowel syndrome without diarrhea: Secondary | ICD-10-CM

## 2023-01-06 DIAGNOSIS — Z515 Encounter for palliative care: Secondary | ICD-10-CM

## 2023-01-06 DIAGNOSIS — K8050 Calculus of bile duct without cholangitis or cholecystitis without obstruction: Secondary | ICD-10-CM | POA: Diagnosis not present

## 2023-01-06 DIAGNOSIS — K769 Liver disease, unspecified: Secondary | ICD-10-CM | POA: Diagnosis not present

## 2023-01-06 DIAGNOSIS — R160 Hepatomegaly, not elsewhere classified: Secondary | ICD-10-CM | POA: Diagnosis not present

## 2023-01-06 LAB — PROTIME-INR
INR: 1.6 — ABNORMAL HIGH (ref 0.8–1.2)
Prothrombin Time: 19.6 s — ABNORMAL HIGH (ref 11.4–15.2)

## 2023-01-06 LAB — CBC
HCT: 30.2 % — ABNORMAL LOW (ref 36.0–46.0)
Hemoglobin: 9.5 g/dL — ABNORMAL LOW (ref 12.0–15.0)
MCH: 27.2 pg (ref 26.0–34.0)
MCHC: 31.5 g/dL (ref 30.0–36.0)
MCV: 86.5 fL (ref 80.0–100.0)
Platelets: 286 10*3/uL (ref 150–400)
RBC: 3.49 MIL/uL — ABNORMAL LOW (ref 3.87–5.11)
RDW: 15.2 % (ref 11.5–15.5)
WBC: 7.7 10*3/uL (ref 4.0–10.5)
nRBC: 0 % (ref 0.0–0.2)

## 2023-01-06 LAB — BASIC METABOLIC PANEL
Anion gap: 11 (ref 5–15)
BUN: 22 mg/dL (ref 8–23)
CO2: 24 mmol/L (ref 22–32)
Calcium: 8.4 mg/dL — ABNORMAL LOW (ref 8.9–10.3)
Chloride: 103 mmol/L (ref 98–111)
Creatinine, Ser: 0.82 mg/dL (ref 0.44–1.00)
GFR, Estimated: >60 mL/min (ref >60)
Glucose, Bld: 103 mg/dL — ABNORMAL HIGH (ref 70–99)
Potassium: 3.6 mmol/L (ref 3.5–5.1)
Sodium: 138 mmol/L (ref 135–145)

## 2023-01-06 LAB — GLUCOSE, CAPILLARY
Glucose-Capillary: 107 mg/dL — ABNORMAL HIGH (ref 70–99)
Glucose-Capillary: 239 mg/dL — ABNORMAL HIGH (ref 70–99)
Glucose-Capillary: 135 mg/dL — ABNORMAL HIGH (ref 70–99)

## 2023-01-06 LAB — HEPATIC FUNCTION PANEL
ALT: 14 U/L (ref 0–44)
AST: 40 U/L (ref 15–41)
Albumin: 1.9 g/dL — ABNORMAL LOW (ref 3.5–5.0)
Alkaline Phosphatase: 102 U/L (ref 38–126)
Bilirubin, Direct: 0.2 mg/dL (ref 0.0–0.2)
Indirect Bilirubin: 0.4 mg/dL (ref 0.3–0.9)
Total Bilirubin: 0.6 mg/dL (ref 0.0–1.2)
Total Protein: 5.4 g/dL — ABNORMAL LOW (ref 6.5–8.1)

## 2023-01-06 LAB — MAGNESIUM: Magnesium: 2.1 mg/dL (ref 1.7–2.4)

## 2023-01-06 LAB — PHOSPHORUS: Phosphorus: 2.8 mg/dL (ref 2.5–4.6)

## 2023-01-06 LAB — AFP TUMOR MARKER: AFP, Serum, Tumor Marker: 107 ng/mL — ABNORMAL HIGH (ref 0.0–8.7)

## 2023-01-06 MED ORDER — INSULIN ASPART 100 UNIT/ML IJ SOLN
3.0000 [IU] | Freq: Once | INTRAMUSCULAR | Status: AC
  Administered 2023-01-06: 3 [IU] via SUBCUTANEOUS

## 2023-01-06 MED ORDER — APIXABAN 5 MG PO TABS
ORAL_TABLET | ORAL | 0 refills | Status: DC
  Filled 2023-01-06: qty 72, 30d supply, fill #0

## 2023-01-06 MED ORDER — THIAMINE HCL 100 MG PO TABS
100.0000 mg | ORAL_TABLET | Freq: Every day | ORAL | 0 refills | Status: DC
  Filled 2023-01-06: qty 30, 30d supply, fill #0

## 2023-01-06 MED ORDER — PROSOURCE PLUS PO LIQD: 30.0000 mL | Freq: Two times a day (BID) | ORAL | Status: DC

## 2023-01-06 MED ORDER — ADULT MULTIVITAMIN W/MINERALS CH
1.0000 | ORAL_TABLET | Freq: Every day | ORAL | 0 refills | Status: DC
  Filled 2023-01-06: qty 30, 30d supply, fill #0

## 2023-01-06 NOTE — Care Management (Signed)
 The patient is confined to one room at home and has urinary urgency, a BSC is needed.

## 2023-01-06 NOTE — Progress Notes (Signed)
 Daily Progress Note Intern Pager: [phone]  Patient name: Heather Oconnor Medical record number: [id number] Date of birth: [birth year] Age: 83 y.o. Gender: female  Primary Care Provider: [name] HERO, FNP Consultants: GI, Surgery Code Status: DNR  Pt Overview and Major Events to Date:  01/03/2023-admitted, MRCP   Assessment and Plan:  Heather Oconnor is an 83 year old with past medical history of resistant hypertension, CVA, CAD, T2DM, GERD, IBS who presents with abdominal pain.  Patient found to have choledocholithiasis/cholelithiasis does not appear obstructive and is without need for urgent surgical intervention.  Also found to have portal vein thrombosis and multifocal liver masses in the setting of cirrhosis, concerning for Hepatocellular Carcinoma.  Assessment & Plan Choledocholithiasis MRCP confirms choledocholithiasis, no plans for surgical intervention at this time.  May consider ERCP, will consult GI as patient has no plans for liver procedure through Onc.  - Surgery has consulted, no indication for emergent surgery, appreciate recommendations and will reach back out as needed - GI consult about ERCP - Tylenol  every 6 hours as needed - AM CBC, CMP - PT/OT to treat Lesion of liver Evidence of HCC on imaging.  Transaminitis has been stable since admission, likely elevated in the setting of liver pathology and/or choledocholithiasis.  Oncology following, plan's to manage outpatient. Will await palliative care consult.  - Oncology following, appreciate recommendations - Follow-up AFP - Palliative Care consulted Portal vein thrombosis Left portal vein thrombus seen on imaging, requiring anticoagulation. Transitioned from heparin  to eliquis  yesterday, per oncology.  - Eliquis  10 mg BID Weight loss RD following and concerned for severe malnutrition/refeeding syndrome. K, Mg, Ph labs BID are stable, no evidence of refeeding syndrome. - RD following, appreciate  recommendations - BMP, Mg, Phos BID x 3 days (12/31 - 1/2)   Chronic and Stable Issues: History of CVA-holding Plavix , ASA for now pending further procedures T2DM-holding home metformin ; CBGs 3 times daily and bedtime HTN-continue home amlodipine , bisoprolol , hydralazine , clonidine ; hold home spironolactone  for now HLD-continue home atorvastatin  GERD-continue home Protonix   FEN/GI: Regular Diet PPx: Eliquis  Dispo:Home .   Subjective:  Doing well today, no complaints, appreciates her strength is getting better  Objective: Temp:  [98.2 F (36.8 C)-99 F (37.2 C)] 98.7 F (37.1 C) (01/02 0500) Pulse Rate:  [67-79] 75 (01/02 0500) Resp:  [16-19] 19 (01/02 0500) BP: (127-167)/(52-94) 167/54 (01/02 0500) SpO2:  [93 %-98 %] 98 % (01/02 0500) Physical Exam: General: NAD, comfortable, conversant Cardiovascular: RRR, NRMG Respiratory: CTABL Abdomen: Soft, nttp, non-distended Extremities: Moving all extremities, no edema  Laboratory: Most recent CBC Lab Results  Component Value Date   WBC 7.7 01/06/2023   HGB 9.5 (L) 01/06/2023   HCT 30.2 (L) 01/06/2023   MCV 86.5 01/06/2023   PLT 286 01/06/2023   Most recent BMP    Latest Ref Rng & Units 01/06/2023    2:35 AM  BMP  Glucose 70 - 99 mg/dL 896   BUN 8 - 23 mg/dL 22   Creatinine 9.55 - 1.00 mg/dL 9.17   Sodium 864 - 854 mmol/L 138   Potassium 3.5 - 5.1 mmol/L 3.6   Chloride 98 - 111 mmol/L 103   CO2 22 - 32 mmol/L 24   Calcium  8.9 - 10.3 mg/dL 8.4     Other pertinent labs  INR: 1.6   Jennelle Riis, MD 01/06/2023, 7:18 AM  PGY-3, [place] [facility] FPTS Intern pager: [phone], text pages welcome Secure chat group [facility] Teaching Service

## 2023-01-06 NOTE — TOC Progression Note (Signed)
 Transition of Care [facility]) - Progression Note    Patient Details  Name: Heather Oconnor MRN: [id number] Date of Birth: 05/16/1940  Transition of [facility] CM/SW Contact  Corean JAYSON Canary, RN Phone Number: 01/06/2023, 10:19 AM  Clinical Narrative:    Daughter  called in to chose [facility] They have looked over the list and would like [facility] or [facility]. [facility] and they accept for PT. Daughter requests [facility] as the patient is confined to one room and has urgency. Will request order for DME and face to face from provider. The patient can be transported home by family.   Expected Discharge Plan: Home w Home Health Services Barriers to Discharge: Continued Medical Work up  Expected Discharge Plan and Services   Discharge Planning Services: CM Consult Post Acute Care Choice: Home Health Living arrangements for the past 2 months: Single Family Home                 DME Arranged: N/A DME Agency: NA       HH Arranged: PT           Social Determinants of Health (SDOH) Interventions SDOH Screenings   Food Insecurity: No Food Insecurity (01/04/2023)  Housing: Low Risk  (01/04/2023)  Transportation Needs: No Transportation Needs (01/04/2023)  Utilities: Not At Risk (01/04/2023)  Alcohol Screen: Low Risk  (12/07/2022)  Depression (PHQ2-9): Low Risk  (12/07/2022)  Financial Resource Strain: Low Risk  (12/07/2022)  Physical Activity: Insufficiently Active (12/07/2022)  Social Connections: Socially Isolated (01/04/2023)  Stress: No Stress Concern Present (12/07/2022)  Tobacco Use: Medium Risk (01/03/2023)  Health Literacy: Adequate Health Literacy (12/07/2022)    Readmission Risk Interventions     No data to display

## 2023-01-06 NOTE — Plan of Care (Signed)
 FMTS Brief Progress Note  Phone Call w/ Neurology  Spoke w/ Dr. 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 He noted that Eliquis  would be maximal therapy and she can stop the ASA/Plavix , doing both therapies would increase her risk of ICH.   Neurology provider was thanked for his time.  Jennelle Riis, MD 01/06/2023, 1:33 PM PGY-3, [facility] Night Resident  Please page [phone] with questions.

## 2023-01-06 NOTE — Plan of Care (Signed)

## 2023-01-06 NOTE — Telephone Encounter (Signed)
 Patient Product/process Development Scientist completed.    The patient is insured through [place]. Patient has Medicare and is not eligible for a copay card, but may be able to apply for patient assistance, if available.    Ran test claim for Eliquis  5 mg and the current 30 day co-pay is $297.00 due to a $250.00 deductible.  Will be $47.00 once deductible is met.   This test claim was processed through [facility]- copay amounts may vary at other pharmacies due to pharmacy/plan contracts, or as the patient moves through the different stages of their insurance plan.     Reyes Sharps, CPHT Pharmacy Technician III Certified Patient Advocate [facility] Patient Advocate Team Direct Number: [phone]  Fax: [phone]

## 2023-01-06 NOTE — Consult Note (Signed)
 Consultation Note Date: 01/06/2023   Patient Name: Heather Oconnor  DOB: [birth year]  MRN: [id number]  Age / Sex: 83 y.o., female  PCP: [name] HERO, FNP Referring Physician: Donah Laymon PARAS, MD  Reason for Consultation: Establishing goals of care  HPI/Patient Profile: 83 y.o. female  with past medical history of resistant HTN, CVA, CAD, T2DM, GERD, and IBS admitted on 01/03/2023 with abdominal pain. MRCP showed choledocholithiasis w/no evidence of obstruction. Ultimately thought to be an incidental finding. RUQ US  revealed multifocal hepatic masses in the left liver lobe consistent with HCC. Left portal vein thrombus seen on imaging, requiring anticoagulation. CT chest revealed solid slightly spiculated 1.1 cm posteromedial right lower lobe pulmonary nodule, suspicious for pulmonary metastasis. PMT consulted to discuss GOC.   Clinical Assessment and Goals of Care: I have reviewed medical records including EPIC notes, labs and imaging, assessed the patient and then met with patient and daughter Heather Oconnor  to discuss diagnosis prognosis, GOC, EOL wishes, disposition and options.  I introduced Palliative Medicine as specialized medical care for people living with serious illness. It focuses on providing relief from the symptoms and stress of a serious illness. The goal is to improve quality of life for both the patient and the family.  Daughter shares prior to admission patient was doing incredibly well. Driving last week. She lived alone and could independently care for herself. They do share of poor appetite and weight loss.    We discussed patient's current illness and what it means in the larger context of patient's on-going co-morbidities.  Natural disease trajectory and expectations at EOL were discussed. We discussed her HCC and ct chest with suspicion for pulmonary mets. We discuss prognosis per her request.   I attempted to elicit  values and goals of care important to the patient.  Patient tells me her main goal is to be at home. She wants to maintain as much independence as possible. She is not interested in chemotherapy. She did say the oncologist discussed other possible procedures that she would consider once she receives more information. She plans to follow up with oncology outpatient following discharge.   They ask about options for care following hospitalization. We discuss getting clarity from oncology first. We discussed outpatient palliative care that they were interested in - referrals made. We also discussed option of hospice. Patient initially shares she would not be interested in hospice. We discussed philosophy of hospice care of type of support that can be provided in the home.   Discussed with patient and daughter the importance of continued conversation with family and the medical providers regarding overall plan of care and treatment options, ensuring decisions are within the context of the patient's values and GOCs.    Throughout conversation she shares of her faith and how her faith brings peace to her during this difficult time.   Questions and concerns were addressed. The family was encouraged to call with questions or concerns.    Primary Decision Maker PATIENT    SUMMARY OF RECOMMENDATIONS   - referred to outpatient palliative services - she tells me she is not interested in cancer treatment however may consider a procedure that was discussed with her by oncology, plans to follow up with oncology outpatient for more clarity - hospice introduced, she and daughter will consider  Code Status/Advance Care Planning: DNR      Primary Diagnoses: Present on Admission:  Cholecystitis   I have reviewed the medical record, interviewed the patient and family, and  examined the patient. The following aspects are pertinent.  Past Medical History:  Diagnosis Date   Blind right eye    Cholecystitis     Cirrhosis (HCC)    Diabetes (HCC)    Diabetic nephropathy (HCC) 02/04/2020   GERD (gastroesophageal reflux disease)    Hemorrhoids    Hypertension    IBS (irritable bowel syndrome)    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   Qualifier: Diagnosis of  By: Burnis Pulling     Pancreatitis    Transient cerebral ischemia 05/08/2008   Qualifier: Diagnosis of  By: Burnis Pulling     Social History   Socioeconomic History   Marital status: Widowed    Spouse name: Not on file   Number of children: 3   Years of education: Not on file   Highest education level: Not on file  Occupational History   Occupation: retired  Tobacco Use   Smoking status: Former    Current packs/day: 0.00    Types: Cigarettes    Start date: 11/26/1997    Quit date: 11/27/2006    Years since quitting: 16.1   Smokeless tobacco: Never  Vaping Use   Vaping status: Never Used  Substance and Sexual Activity   Alcohol use: No    Alcohol/week: 0.0 standard drinks of alcohol   Drug use: No   Sexual activity: Not Currently  Other Topics Concern   Not on file  Social History Narrative   Three children.  All live nearby.   Social Drivers of Corporate Investment Banker Strain: Low Risk  (12/07/2022)   Overall Financial Resource Strain (CARDIA)    Difficulty of Paying Living Expenses: Not hard at all  Food Insecurity: No Food Insecurity (01/04/2023)   Hunger Vital Sign    Worried About Running Out of Food in the Last Year: Never true    Ran Out of Food in the Last Year: Never true  Transportation Needs: No Transportation Needs (01/04/2023)   PRAPARE - Administrator, Civil Service (Medical): No    Lack of Transportation (Non-Medical): No  Physical Activity: Insufficiently Active (12/07/2022)   Exercise Vital Sign    Days of Exercise per Week: 3 days    Minutes of Exercise per Session: 30 min  Stress: No Stress Concern Present (12/07/2022)   [employer] of Occupational Health - Occupational Stress  Questionnaire    Feeling of Stress : Not at all  Social Connections: Socially Isolated (01/04/2023)   Social Connection and Isolation Panel [NHANES]    Frequency of Communication with Friends and Family: Never    Frequency of Social Gatherings with Friends and Family: Never    Attends Religious Services: Never    Database Administrator or Organizations: No    Attends Banker Meetings: Never    Marital Status: Widowed   Family History  Problem Relation Age of Onset   Hypertension Mother    Heart attack Father    Hypertension Father    Hypertension Sister    Hypertension Brother    Hypertension Brother    Diabetes Son    Hypertension Son    Colon cancer Neg Hx    Scheduled Meds:  (feeding supplement) PROSource Plus  30 mL Oral BID BM   amLODipine   10 mg Oral Daily   apixaban   10 mg Oral BID   Followed by   NOREEN ON 01/12/2023] apixaban   5 mg Oral BID   atorvastatin   40 mg Oral Daily   bisoprolol   7.5 mg Oral Daily   cloNIDine   0.2 mg Oral BID   dorzolamide -timolol   1 drop Both Eyes BID   feeding supplement  1 Container Oral TID BM   hydrALAZINE   100 mg Oral TID   irbesartan   300 mg Oral Daily   mirtazapine   15 mg Oral QHS   multivitamin with minerals  1 tablet Oral Daily   pantoprazole   40 mg Oral Daily   spironolactone   25 mg Oral Daily   thiamine   100 mg Oral Daily   Continuous Infusions: PRN Meds:.acetaminophen  **OR** acetaminophen , ondansetron , polyethylene glycol Allergies  Allergen Reactions   Hctz [Hydrochlorothiazide]     Per GI may have caused pancreatitis. Dr. Teressa recommended never resuming.   Lisinopril      H/O pancreatitis.   Review of Systems  Constitutional:  Positive for activity change, appetite change and fatigue.  Gastrointestinal:  Positive for abdominal pain.  Neurological:  Positive for weakness.    Physical Exam Constitutional:      General: She is not in acute distress. Pulmonary:     Effort: Pulmonary effort is normal.   Skin:    General: Skin is warm and dry.  Neurological:     Mental Status: She is alert and oriented to person, place, and time.  Psychiatric:        Mood and Affect: Mood normal.        Behavior: Behavior normal.     Vital Signs: BP (!) 138/53   Pulse 69   Temp 97.6 F (36.4 C)   Resp 18   SpO2 96%  Pain Scale: 0-10   Pain Score: 0-No pain   SpO2: SpO2: 96 % O2 Device:SpO2: 96 % O2 Flow Rate: .   IO: Intake/output summary:  Intake/Output Summary (Last 24 hours) at 01/06/2023 1624 Last data filed at 01/06/2023 1300 Gross per 24 hour  Intake 840 ml  Output 500 ml  Net 340 ml    LBM: Last BM Date : 01/05/23 Baseline Weight:   Most recent weight:       Palliative Assessment/Data: PPS 50%     *Please note that this is a verbal dictation therefore any spelling or grammatical errors are due to the [employer] One system interpretation.   Time Total: 65 minutes Time spent includes: Detailed review of medical records (labs, imaging, vital signs), medically appropriate exam, discussion with treatment team, counseling and educating patient, family and/or staff, documenting clinical information, medication management and coordination of care.    Tobey Jama Barnacle, DNP, AGNP-C Palliative Medicine Team [phone] Pager: [phone]

## 2023-01-06 NOTE — Discharge Summary (Signed)
 Family Medicine Teaching [facility] Discharge Summary  Patient name: Heather Oconnor Medical record number: [id number] Date of birth: 07/03/1940 Age: 83 y.o. Gender: female Date of Admission: 01/03/2023  Date of Discharge: 01/06/23 Admitting Physician: Lauraine Norse, DO  Primary Care Provider: [name] HERO, FNP Consultants: GI, Surgery  Indication for Hospitalization: Abdominal Pain  Discharge Diagnoses/Problem List:  Principal Problem for Admission:  Other Problems addressed during stay:  Principal Problem:   Cholecystitis Active Problems:   Transaminitis   Lesion of liver   Abnormal liver diagnostic imaging   Gallstones   Portal vein thrombosis   Weight loss   Liver masses   Choledocholithiasis   Antiplatelet or antithrombotic long-term use   Protein-calorie malnutrition, severe   Hepatocellular carcinoma [facility])    Brief Hospital Course:  Heather Oconnor is a 83 y.o.female with a history of resistant HTN, CVA, CAD, T2DM, GERD, IBS who was admitted to the [facility] Teaching [facility] at [facility] for abdominal pain. Her hospital course is detailed below:  Choledocholithiasis/cholelithiasis Initially presented with diffuse tenderness to palpation of abdomen and RUQ US  showed evidence of cholecystitis.  Surgery and GI were both consulted and patient proceeded with MRCP which showed choledocholithiasis, but no evidence that patient was obstructed, and as such did not require emergent surgical intervention.  Ultimately thought to be an incidental finding and her symptoms more likely attributable to tumor burden.  Liver lesions  HCC  malignant portal vein thrombosis Incidental finding of multifocal hepatic masses in the left liver lobe on RUQ US , thought to be most consistent with [facility] per radiology.  AFP level was pending at discharge.  Oncology was consulted and agreed that patient would be better served in the outpatient setting - no further inpatient workup  indicated. Patient was transitioned from IV heparin  to Eliquis  for portal vein thrombosis.  Weight loss  severe malnutrition Family concerned about weight loss and significantly decreased appetite in recent weeks.  Nutrition was consulted during admission and advised nutritional supplement drinks and MVI.  Refeeding syndrome labs were collected twice daily without evidence of refeeding syndrome.  Other chronic conditions were medically managed with home medications and formulary alternatives as necessary (T2DM, HTN, HLD, GERD)   PCP Follow-up Recommendations: D/c ASA/Plavix  now that she is on Eliquis , as puts at increased risk for ICH Continue Eliquis  Be sure Patient to follow up w/ Oncology Patient to follow up with GI if desired for choledocholithiasis    Disposition: Home  Discharge Condition: Improved  Discharge Exam:  Vitals:   01/06/23 1524 01/06/23 1619  BP: (!) 138/53 (!) 138/53  Pulse: 69   Resp: 18   Temp: 97.6 F (36.4 C)   SpO2: 96%      Significant Labs and Imaging:  Recent Labs  Lab 01/05/23 0808 01/06/23 0235  WBC 8.7 7.7  HGB 10.0* 9.5*  HCT 32.1* 30.2*  PLT 297 286   Recent Labs  Lab 01/04/23 1757 01/05/23 0808 01/05/23 1832 01/06/23 0235  NA 136 137 136 138  K 4.0 3.6 3.8 3.6  CL 101 103 103 103  CO2 22 22 24 24   GLUCOSE 251* 139* 205* 103*  BUN 26* 22 23 22   CREATININE 1.11* 0.79 0.94 0.82  CALCIUM  8.6* 8.4* 8.3* 8.4*  MG 2.3 2.1 2.1 2.1  PHOS 3.4 2.7 2.9 2.8  ALKPHOS  --   --   --  102  AST  --   --   --  40  ALT  --   --   --  14  ALBUMIN  --   --   --  1.9*    MRCP showed: 1. Multifocal bilobar hepatic malignant tumors. Lesions reach confluence within the LEFT lateral hepatic lobe. 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 Enhancement pattern and portal vein tumor thrombosis are typical of hepatocellular carcinoma. 2.  Porta hepatis lymphadenopathy. 3. Distal choledocholithiasis mild extrahepatic duct dilatation. Gallbladder distension.  Results/Tests Pending at Time of Discharge: AFP  Discharge Medications:  Allergies as of 01/06/2023       Reactions   Hctz [hydrochlorothiazide]    Per GI may have caused pancreatitis. Dr. Teressa recommended never resuming.   Lisinopril     H/O pancreatitis.        Medication List     STOP taking these medications    aspirin  EC 81 MG tablet   clopidogrel  75 MG tablet Commonly known as: PLAVIX        TAKE these medications    (feeding supplement) PROSource Plus liquid Take 30 mLs by mouth 2 (two) times daily between meals. Start taking on: January 07, 2023   amLODipine  10 MG tablet Commonly known as: NORVASC  TAKE 1 TABLET EVERY DAY   apixaban  5 MG Tabs tablet Commonly known as: ELIQUIS  Take 2 tablets (10 mg total) by mouth 2 (two) times daily for 6 days, THEN 1 tablet (5 mg total) 2 (two) times daily. Start taking on: January 06, 2023   atorvastatin  40 MG tablet Commonly known as: LIPITOR TAKE 1 TABLET EVERY DAY   bisoprolol  5 MG tablet Commonly known as: ZEBETA  TAKE 1 AND 1/2 TABLETS EVERY DAY What changed: See the new instructions.   cloNIDine  0.2 MG tablet Commonly known as: CATAPRES  TAKE 1 TABLET TWICE DAILY   cyanocobalamin  1000 MCG tablet Commonly known as: VITAMIN B12 Take 1,000 mcg by mouth daily.   dorzolamide -timolol  2-0.5 % ophthalmic solution Commonly known as: COSOPT  Place 1 drop into both eyes 2 (two) times daily.   hydrALAZINE  100 MG tablet Commonly known as: APRESOLINE  TAKE 1 TABLET THREE TIMES DAILY   metFORMIN  500 MG tablet Commonly known as: GLUCOPHAGE  TAKE 2 TABLETS TWICE DAILY WITH MEALS What changed: See the new instructions.   mirtazapine  15 MG tablet Commonly known as: REMERON  TAKE 1 TABLET AT BEDTIME   multivitamin with minerals Tabs tablet Take 1 tablet by mouth daily.   ondansetron  4 MG  tablet Commonly known as: Zofran  Take 1 tablet (4 mg total) by mouth every 8 (eight) hours as needed for nausea or vomiting.   pantoprazole  40 MG tablet Commonly known as: PROTONIX  TAKE 1 TABLET EVERY DAY   spironolactone  25 MG tablet Commonly known as: ALDACTONE  Take 1 tablet (25 mg total) by mouth daily.   thiamine  100 MG tablet Commonly known as: Vitamin B-1 Take 1 tablet (100 mg total) by mouth daily.   True Metrix Blood Glucose Test test strip Generic drug: glucose blood 1 each by Other route 4 (four) times daily as needed.   TRUEplus Lancets 33G Misc Test BS up to four times daily as needed Dx E11.9   valsartan  320 MG tablet Commonly known as: DIOVAN  Take 1 tablet (320 mg total) by mouth daily.   Vitamin D  (Ergocalciferol ) 1.25 MG (50000 UNIT) Caps capsule Commonly known as: DRISDOL  Take 1 capsule (50,000 Units total) by mouth every 7 (seven) days.  Durable Medical Equipment  (From admission, onward)           Start     Ordered   01/06/23 1025  For home use only DME Bedside commode  Once       Question:  Patient needs a bedside commode to treat with the following condition  Answer:  Weakness   01/06/23 1024            Discharge Instructions: Please refer to Patient Instructions section of EMR for full details.  Patient was counseled important signs and symptoms that should prompt return to medical care, changes in medications, dietary instructions, activity restrictions, and follow up appointments.   Follow-Up Appointments:  Follow-up Information     [facility] Follow up.   Specialty: Home Health Services Why: Your HOme health agency they will call you to schedule v home visits Contact information: [address] [place] [address] [phone]         [facility] Follow up.   Why: Bedside commode provider        [name] HERO, FNP. Schedule an appointment as soon as possible for a  visit in 1 week(s).   Specialty: Family Medicine Contact information: [address] [place] [address] [phone]                 Jennelle Riis, MD 01/06/2023, 5:18 PM PGY-3, [facility]

## 2023-01-06 NOTE — Progress Notes (Signed)
 Physical Therapy Treatment Patient Details Name: MEARA WIECHMAN MRN: [id number] DOB: Jan 27, 1940 Today's Date: 01/06/2023   History of Present Illness Pt is a 83 y.o. F who presents 01/03/2023 with abdominal pain. RUQ US  with multifocal hepatic masses in the left lobe, measuring up to 5 cm.  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 Significant PMH: CVA on Plavix , IBS, history of pancreatic pseudocysts, HLD, HTN, HFpEF, COPD, CKD stage 3b, H/o cirrhosis.    PT Comments  Pt making steady progress towards her physical therapy goals; seems to be improved spirits this morning and demonstrates increased activity tolerance and ambulation distance. Pt ambulating 200 ft with a walker, without physical assist. Discussed with pt/pt daughter fall prevention techniques and home set up and recommendation for intermittent supervision. Will benefit from HHPT to address strength, balance, endurance.    If plan is discharge home, recommend the following: A little help with walking and/or transfers;A little help with bathing/dressing/bathroom;Assistance with cooking/housework;Assist for transportation;Help with stairs or ramp for entrance   Can travel by private vehicle        Equipment Recommendations  BSC/3in1    Recommendations for Other Services       Precautions / Restrictions Precautions Precautions: Fall Restrictions Weight Bearing Restrictions Per Provider Order: No     Mobility  Bed Mobility               General bed mobility comments: OOB in bathroom upon entry    Transfers Overall transfer level: Needs assistance Equipment used: Rolling walker (2 wheels) Transfers: Sit to/from Stand Sit to Stand: Supervision                Ambulation/Gait Ambulation/Gait assistance: Contact guard assist Gait Distance (Feet): 200 Feet Assistive device: Rolling walker (2 wheels) Gait Pattern/deviations: Step-through pattern, Decreased stride length Gait velocity:  decreased     General Gait Details: Slower and steady pace, moderate reliance through arms on walker. Cues for upward gaze   Stairs             Wheelchair Mobility     Tilt Bed    Modified Rankin (Stroke Patients Only)       Balance Overall balance assessment: Needs assistance Sitting-balance support: Feet supported Sitting balance-Leahy Scale: Good     Standing balance support: Bilateral upper extremity supported Standing balance-Leahy Scale: Poor                              Cognition Arousal: Alert Behavior During Therapy: WFL for tasks assessed/performed, Flat affect Overall Cognitive Status: Within Functional Limits for tasks assessed                                          Exercises      General Comments        Pertinent Vitals/Pain Pain Assessment Pain Assessment: Faces Faces Pain Scale: No hurt    Home Living                          Prior Function            PT Goals (current goals can now be found in the care plan section) Acute Rehab PT Goals Patient Stated Goal: go home PT Goal Formulation: With patient/family Time For Goal Achievement: 01/18/23 Potential to Achieve Goals: Fair  Progress towards PT goals: Progressing toward goals    Frequency    Min 1X/week      PT Plan      Co-evaluation              AM-PAC PT 6 Clicks Mobility   Outcome Measure  Help needed turning from your back to your side while in a flat bed without using bedrails?: A Little Help needed moving from lying on your back to sitting on the side of a flat bed without using bedrails?: A Little Help needed moving to and from a bed to a chair (including a wheelchair)?: A Little Help needed standing up from a chair using your arms (e.g., wheelchair or bedside chair)?: A Little Help needed to walk in hospital room?: A Little Help needed climbing 3-5 steps with a railing? : A Little 6 Click Score: 18    End  of Session Equipment Utilized During Treatment: Gait belt Activity Tolerance: Patient tolerated treatment well Patient left: with call bell/phone within reach;in chair;with family/visitor present Nurse Communication: Mobility status PT Visit Diagnosis: Unsteadiness on feet (R26.81);Muscle weakness (generalized) (M62.81)     Time: 9089-9067 PT Time Calculation (min) (ACUTE ONLY): 22 min  Charges:    $Therapeutic Activity: 8-22 mins PT General Charges $$ ACUTE PT VISIT: 1 Visit                     Aleck Daring, PT, DPT Acute Rehabilitation Services Office [phone]    Alayne ONEIDA Daring 01/06/2023, 11:05 AM

## 2023-01-06 NOTE — Assessment & Plan Note (Addendum)
 Left portal vein thrombus seen on imaging, requiring anticoagulation. Transitioned from heparin to eliquis yesterday, per oncology.  - Eliquis 10 mg BID

## 2023-01-06 NOTE — Assessment & Plan Note (Addendum)
 Evidence of HCC on imaging.  Transaminitis has been stable since admission, likely elevated in the setting of liver pathology and/or choledocholithiasis.  Oncology following, plan's to manage outpatient. Will await palliative care consult.  - Oncology following, appreciate recommendations - Follow-up AFP - Palliative Care consulted

## 2023-01-06 NOTE — Progress Notes (Signed)
 Daily Progress Note  DOA: 01/03/2023 Hospital Day: 4  Chief Complaint: Elevated LFts, abnormal US    ASSESSMENT    Brief Narrative:  Heather Oconnor is a 83 y.o. year old female with a history of  cholelithiasis, IBS, possibly cirrhosis ( less concern for it based on newer imaging), GERD with esophagitis, small hiatal hernia, internal / external hemorrhoids, pancreatitis with pseudocysts, DM. Came to ED for recent US  showing liver masses and also weakness, decreased appetite and weight loss. GI saw in consult 12/31 for possible CBD stones  Choledocholithiasis / extrahepatic biliary duct dilation. LFTs. Only minimal cholestasis on labs from 12/30. No significant abdominal pain.   Cholelithiasis with possible cholecystitis.  Surgery has evaluated, no indication for emergency surgery.   Multifocal liver lesions on US  ( ? HCC ) / Lesions invading portal vein    Chronic anti-platelet therapy.  Takes Plavix  ( ? Hx of TIA / CVA)   [place] anemia.  Hgb 10.8, down from baseline of 11-12. Denies overt GI bleeding   GERD / history of esophagitis Asymptomatic on daily PPI   IBS.  Review of R[facility]'s notes suggests she has diarrhea predominant IBS. Bowel movements at baseline   Principal Problem:   Cholecystitis Active Problems:   Transaminitis   Lesion of liver   Abnormal liver diagnostic imaging   Gallstones   Portal vein thrombosis   Weight loss   Liver masses   Choledocholithiasis   Antiplatelet or antithrombotic long-term use   Protein-calorie malnutrition, severe   PLAN   -Discussed with patient and daughter the role of ERCP for removal of bile duct stones. We would need to give more time for plavix  washout and also now on Eliquis . Patient doesn't want to pursue ERCP. She does understand that in future she could become more cholestatic from bile duct stones but also tumor burden on liver.  -Though she declines ERCP it is reasonable to get a hepatic function panel today  to make sure LFTs stable.   --She can follow up with her primary GI at [facility]   Subjective   Feels okay. No significant abdominal pain.    Objective     Recent Labs    01/04/23 0358 01/05/23 0808 01/06/23 0235  WBC 10.0 8.7 7.7  HGB 9.9* 10.0* 9.5*  HCT 32.0* 32.1* 30.2*  PLT 266 297 286   BMET Recent Labs    01/05/23 0808 01/05/23 1832 01/06/23 0235  NA 137 136 138  K 3.6 3.8 3.6  CL 103 103 103  CO2 22 24 24   GLUCOSE 139* 205* 103*  BUN 22 23 22   CREATININE 0.79 0.94 0.82  CALCIUM  8.4* 8.3* 8.4*   LFT Recent Labs    01/04/23 0358  PROT 5.6*  ALBUMIN 2.1*  AST 45*  ALT 10  ALKPHOS 92  BILITOT 1.3*   PT/INR Recent Labs    01/06/23 0235  LABPROT 19.6*  INR 1.6*     Imaging:  CT CHEST W CONTRAST CLINICAL DATA:  Hepatocellular carcinoma, staging. * Tracking Code: BO *  EXAM: CT CHEST WITH CONTRAST  TECHNIQUE: Multidetector CT imaging of the chest was performed during intravenous contrast administration.  RADIATION DOSE REDUCTION: This exam was performed according to the departmental dose-optimization program which includes automated exposure control, adjustment of the mA and/or kV according to patient size and/or use of iterative reconstruction technique.  CONTRAST:  50mL OMNIPAQUE  IOHEXOL  350 MG/ML SOLN  COMPARISON:  06/16/2018 chest CT angiogram.  12/25/2018 CT  abdomen.  FINDINGS: Cardiovascular: Borderline mild cardiomegaly. Small pericardial effusion. Three-vessel coronary atherosclerosis. Atherosclerotic nonaneurysmal thoracic aorta. Normal caliber pulmonary arteries. No central pulmonary emboli.  Mediastinum/Nodes: Peripherally calcified centrally hypodense 0.9 cm right thyroid  nodule. Unremarkable esophagus. No pathologically enlarged axillary, mediastinal or hilar lymph nodes.  Lungs/Pleura: No pneumothorax. Trace posterior bilateral pleural effusions. Moderate centrilobular emphysema with mild diffuse bronchial wall  thickening. Solid slightly spiculated 1.1 cm posteromedial right lower lobe pulmonary nodule (series 4/image 94), with this lung region previously obscured by atelectasis on 06/16/2018 chest CT. No acute consolidative airspace disease, lung masses or additional significant pulmonary nodules.  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 Trace upper abdominal ascites.  Musculoskeletal: No aggressive appearing focal osseous lesions. Marked thoracic spondylosis.  IMPRESSION: 1. Solid slightly spiculated 1.1 cm posteromedial right lower lobe pulmonary nodule, suspicious for pulmonary metastasis. 2. No thoracic adenopathy. 3. Trace posterior bilateral pleural effusions. 4. Borderline mild cardiomegaly. Small pericardial effusion. Three-vessel coronary atherosclerosis. 5. 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 Trace upper abdominal ascites. Please see the recent MRI abdomen report for further details. 6. Aortic Atherosclerosis (ICD10-I70.0) and Emphysema (ICD10-J43.9).  Electronically Signed   By: Selinda DELENA Blue M.D.   On: 01/05/2023 19:44   MRI / MRCP 1. Multifocal bilobar hepatic malignant tumors. Lesions reach confluence within the LEFT lateral hepatic lobe. 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 Enhancement pattern and portal vein tumor thrombosis are typical of hepatocellular carcinoma. 2. Porta hepatis lymphadenopathy. 3. Distal choledocholithiasis mild extrahepatic duct dilatation. Gallbladder distension.    Scheduled inpatient medications:   (feeding supplement) PROSource Plus  30 mL Oral BID BM    amLODipine   10 mg Oral Daily   apixaban   10 mg Oral BID   Followed by   NOREEN ON 01/12/2023] apixaban   5 mg Oral BID   atorvastatin   40 mg Oral Daily   bisoprolol   7.5 mg Oral Daily   cloNIDine   0.2 mg Oral BID   dorzolamide -timolol   1 drop Both Eyes BID   feeding supplement  1 Container Oral TID BM   hydrALAZINE   100 mg Oral TID   irbesartan   300 mg Oral Daily   mirtazapine   15 mg Oral QHS   multivitamin with minerals  1 tablet Oral Daily   pantoprazole   40 mg Oral Daily   spironolactone   25 mg Oral Daily   thiamine   100 mg Oral Daily   Continuous inpatient infusions:  PRN inpatient medications: acetaminophen  **OR** acetaminophen , ondansetron , polyethylene glycol  Vital signs in last 24 hours: Temp:  [97.5 F (36.4 C)-99 F (37.2 C)] 97.5 F (36.4 C) (01/02 0740) Pulse Rate:  [67-75] 68 (01/02 0740) Resp:  [16-19] 17 (01/02 0740) BP: (127-167)/(52-69) 157/69 (01/02 0857) SpO2:  [94 %-98 %] 97 % (01/02 0740) Last BM Date : 01/05/23  Intake/Output Summary (Last 24 hours) at 01/06/2023 1237 Last data filed at 01/06/2023 0455 Gross per 24 hour  Intake 120 ml  Output 500 ml  Net -380 ml    Intake/Output from previous day: 01/01 0701 - 01/02 0700 In: 120 [P.O.:120] Out: 500 [Urine:500] Intake/Output this shift: No intake/output data recorded.   Physical Exam:  General: Alert female in NAD  Heart:  Regular rate and rhythm.  Pulmonary: Normal respiratory effort Abdomen: Soft, nondistended, nontender. Normal bowel sounds. Extremities: No lower extremity edema  Neurologic: Alert and oriented Psych: Pleasant. Cooperative. Insight appears normal.      LOS: 3 days   Vina Dasen ,NP 01/06/2023, 12:37 PM

## 2023-01-06 NOTE — Discharge Instructions (Addendum)
 Dear Heather Oconnor,  Thank you for letting us  participate in your care. You were hospitalized for abdominal pain and diagnosed with Hepatocellular Carcinoma (a Cancer of the Liver) and Choledocholithiasis (gallstones in the bile system). You were treated with pain medication. You were also noted to have a blood clot in a blood vessel of your gut (Splenic Vein Thrombosis), for which you will have to take Eliquis  for this, and follow up with your Oncologist.    POST-HOSPITAL & CARE INSTRUCTIONS Continue Eliquis  Stop taking Asprin and Plavix  Follow up with Oncology  Follow up with Palliative Care Follow up with Gastroenterology (GI), if you develop abdominal pain, yellowing of skin/eyes, or want follow up for your choledocholithiasis Go to your follow up appointments (listed below)   DOCTOR'S APPOINTMENT   Future Appointments  Date Time Provider Department Center  05/04/2023 10:00 AM [name] HERO, FNP [facility] None  12/08/2023  9:20 AM [facility]-ANNUAL WELLNESS VISIT [facility] None    Follow-up Information     [facility] Follow up.   Specialty: Home Health Services Why: Your HOme health agency they will call you to schedule v home visits Contact information: [address] [place] [address] [phone]         [facility] Follow up.   Why: Bedside commode provider        [name] HERO, FNP. Schedule an appointment as soon as possible for a visit in 1 week(s).   Specialty: Family Medicine Contact information: [address] [place] [address] [phone]                 Take care and be well!  Family Medicine Teaching Service Inpatient Team [place]  [facility]  [address] [place], [place] [address] [phone]       Information on my medicine - ELIQUIS  (apixaban )  This medication education was reviewed with me or my healthcare representative as part of my discharge preparation.     Why was Eliquis  prescribed for you? Eliquis  was prescribed to treat blood clots that may have been found in the veins of your legs (deep vein thrombosis) or in your lungs (pulmonary embolism) and to reduce the risk of them occurring again.  What do You need to know about Eliquis  ? The starting dose is 10 mg (two 5 mg tablets) taken TWICE daily for the FIRST SEVEN (7) DAYS, then on (enter date)  01/12/2023  the dose is reduced to ONE 5 mg tablet taken TWICE daily.  Eliquis  may be taken with or without food.   Try to take the dose about the same time in the morning and in the evening. If you have difficulty swallowing the tablet whole please discuss with your pharmacist how to take the medication safely.  Take Eliquis  exactly as prescribed and DO NOT stop taking Eliquis  without talking to the doctor who prescribed the medication.  Stopping may increase your risk of developing a new blood clot.  Refill your prescription before you run out.  After discharge, you should have regular check-up appointments with your healthcare provider that is prescribing your Eliquis .    What do you do if you miss a dose? If a dose of ELIQUIS  is not taken at the scheduled time, take it as soon as possible on the same day and twice-daily administration should be resumed. The dose should not be doubled to make up for a missed dose.  Important Safety Information A  possible side effect of Eliquis  is bleeding. You should call your healthcare provider right away if you experience any of the following: Bleeding from an injury or your nose that does not stop. Unusual colored urine (red or dark brown) or unusual colored stools (red or black). Unusual bruising for unknown reasons. A serious fall or if you hit your head (even if there is no bleeding).  Some medicines may interact with Eliquis  and might increase your risk of bleeding or clotting while on Eliquis . To help avoid this, consult your healthcare provider  or pharmacist prior to using any new prescription or non-prescription medications, including herbals, vitamins, non-steroidal anti-inflammatory drugs (NSAIDs) and supplements.  This website has more information on Eliquis  (apixaban ): http://[email or web address] .com/eliquis dena

## 2023-01-06 NOTE — Assessment & Plan Note (Addendum)
 MRCP confirms choledocholithiasis, no plans for surgical intervention at this time.  May consider ERCP, will consult GI as patient has no plans for liver procedure through Onc.  - Surgery has consulted, no indication for emergent surgery, appreciate recommendations and will reach back out as needed - GI consult about ERCP - Tylenol  every 6 hours as needed - AM CBC, CMP - PT/OT to treat

## 2023-01-06 NOTE — Assessment & Plan Note (Signed)
 RD following and concerned for severe malnutrition/refeeding syndrome. K, Mg, Ph labs BID are stable, no evidence of refeeding syndrome. - RD following, appreciate recommendations - BMP, Mg, Phos BID x 3 days (12/31 - 1/2)

## 2023-01-07 ENCOUNTER — Telehealth: Payer: Self-pay | Admitting: *Deleted

## 2023-01-07 ENCOUNTER — Ambulatory Visit: Payer: Self-pay | Admitting: Family Medicine

## 2023-01-07 ENCOUNTER — Encounter: Payer: Self-pay | Admitting: *Deleted

## 2023-01-07 NOTE — Telephone Encounter (Signed)
 Called spoke with patient.

## 2023-01-07 NOTE — Transitions of Care (Post Inpatient/ED Visit) (Addendum)
 01/07/2023  Name: AUGUSTINA BRADDOCK MRN: [id number] DOB: 1940-01-27  Today's TOC FU Call Status: Today's TOC FU Call Status:: Successful TOC FU Call Completed TOC FU Call Complete Date: 01/07/23 Patient's Name and Date of Birth confirmed.  Transition Care Management Follow-up Telephone Call Date of Discharge: 01/06/23 Discharge Facility: [facility]) Type of Discharge: Inpatient Admission Primary Inpatient Discharge Diagnosis:: Acute cholecystitis How have you been since you were released from the hospital?:  (denies any nausea, eating better, ambulating well, no issues with bowel / bladder) Any questions or concerns?: No  Items Reviewed: Did you receive and understand the discharge instructions provided?: Yes Medications obtained,verified, and reconciled?: Yes (Medications Reviewed) Any new allergies since your discharge?: No Dietary orders reviewed?: Yes Type of Diet Ordered:: heart healthy,   nutritional supplements Do you have support at home?: Yes People in Home: alone Name of Support/Comfort Primary Source: [name] daughter and other family members assist as needed Patient's daughter consented to enrollment in 30 day program   Goals Addressed             This Visit's Progress    Patient Stated       Transition of Care/ pt will have no readmissions within 30 days       Current Barriers:  Provider appointments per daughter she nor pt knows name of oncologist or phone number for pt follow up Home Health services [facility] has not contacted pt to schedule home visit Chronic Disease Management support and education needs related to Liver carcinoma/ cholecystitis  Spoke with patient who prefers RN Care Manager speak with her daughter [name], per daughter pt is feeling much better, nausea is under control, appetite is better, pt will be drinking Boost (dtr has ordered), is not using ProSource as pt does not like the taste.  Daughter states she has no  idea who oncologist is that pt is supposed to follow up with r/t liver carcinoma.  RNCM Clinical Goal(s):  Patient will work with the Care Management team over the next 30 days to address Transition of Care Barriers: Provider appointments Home Health services verbalize understanding of plan for management of Liver carcinoma/ cholecystitis as evidenced by pt/ cg report, review of EMR and  through collaboration with RN Care manager, provider, and care team.   Interventions: Evaluation of current treatment plan related to  self management and patient's adherence to plan as established by provider   Liver carcinoma/ cholecystits  (Status:  New goal. and Goal on track:  Yes.)  Short Term Goal Evaluation of current treatment plan related to  Liver carcinoma/ cholecystitis ,  home health, oncology follow up  self-management and patient's adherence to plan as established by provider. Discussed plans with patient for ongoing care management follow up and provided patient with direct contact information for care management team Evaluation of current treatment plan related to liver carcinoma, cholecystitis and patient's adherence to plan as established by provider Reviewed medications with patient and discussed importance of taking as prescribed Reviewed scheduled/upcoming provider appointments including daughter will call and schedule post hospital follow up appointment Discussed plans with patient for ongoing care management follow up and provided patient with direct contact information for care management team Assessed social determinant of health barriers In basket sent to primary care provider inquiring if she knows who oncologist is that pt is supposed to follow up with, daughter was not given a name or contact number, unable to verify by discharge instructions/ AVS  Patient Goals/Self-Care Activities: Participate in Transition of Care Program/Attend [facility] scheduled calls Notify RN Care Manager of  TOC call rescheduling needs Take all medications as prescribed Attend all scheduled provider appointments Call pharmacy for medication refills 3-7 days in advance of running out of medications Call provider office for new concerns or questions  Please follow up with primary care provider within 1-2 weeks RN Care Manager is working on verifying who oncologist is and contact number [facility]  [phone] will contact you to scheduled home visit for 01/08/23  Follow Up Plan:  Telephone follow up appointment with care management team member scheduled for:  01/14/23 @ 10 am The patient has been provided with contact information for the care management team and has been advised to call with any health related questions or concerns.           Medications Reviewed Today: Medications Reviewed Today     Reviewed by Aura Mliss LABOR, RN (Registered Nurse) on 01/07/23 at 1028  Med List Status: <None>   Medication Order Taking? Sig Documenting Provider Last Dose Status Informant  amLODipine  (NORVASC ) 10 MG tablet [id number] Yes TAKE 1 TABLET EVERY DAY [name] HERO, FNP Taking Active Child, Self  apixaban  (ELIQUIS ) 5 MG TABS tablet [id number] Yes Take 2 tablets (10 mg total) by mouth 2 (two) times daily for 6 days, THEN 1 tablet (5 mg total) 2 (two) times daily. Jennelle Riis, MD Taking Active   atorvastatin  (LIPITOR) 40 MG tablet [id number] No TAKE 1 TABLET EVERY DAY  Patient not taking: Reported on 01/07/2023   Vannie Reche RAMAN, NP Not Taking Active Child, Self           Med Note (Diego Ulbricht A   Fri Jan 07, 2023 10:24 AM) Daughter reports pt was taken off this medication  bisoprolol  (ZEBETA ) 5 MG tablet [id number] Yes TAKE 1 AND 1/2 TABLETS EVERY DAY  Patient taking differently: Take 7.5 mg by mouth daily.   [name] HERO, FNP Taking Active Child, Self  cloNIDine  (CATAPRES ) 0.2 MG tablet [id number] Yes TAKE 1 TABLET TWICE DAILY [name] HERO, FNP Taking Active Child, Self   dorzolamide -timolol  (COSOPT ) 22.3-6.8 MG/ML ophthalmic solution [id number] Yes Place 1 drop into both eyes 2 (two) times daily. [provider] Taking Active Child, Self  hydrALAZINE  (APRESOLINE ) 100 MG tablet [id number] Yes TAKE 1 TABLET THREE TIMES DAILY [name] HERO, FNP Taking Active Child, Self  metFORMIN  (GLUCOPHAGE ) 500 MG tablet [id number] Yes TAKE 2 TABLETS TWICE DAILY WITH MEALS  Patient taking differently: Take 1,000 mg by mouth 2 (two) times daily with a meal. Patient has only been taking one tablet twice daily due to illness. 01/03/2023.   [name] HERO, FNP Taking Active Child, Self  mirtazapine  (REMERON ) 15 MG tablet [id number] Yes TAKE 1 TABLET AT BEDTIME [name] HERO, FNP Taking Active Child, Self  Multiple Vitamin (MULTIVITAMIN WITH MINERALS) TABS tablet [id number] Yes Take 1 tablet by mouth daily. Jennelle Riis, MD Taking Active   Nutritional Supplements (,FEEDING SUPPLEMENT, PROSOURCE PLUS) liquid [id number] No Take 30 mLs by mouth 2 (two) times daily between meals.  Patient not taking: Reported on 01/07/2023   Jennelle Riis, MD Not Taking Active   ondansetron  (ZOFRAN ) 4 MG tablet [id number] Yes Take 1 tablet (4 mg total) by mouth every 8 (eight) hours as needed for nausea or vomiting. Severa Rock HERO, FNP Taking Active Child, Self  pantoprazole  (PROTONIX ) 40 MG tablet [id number] Yes TAKE 1 TABLET EVERY DAY [name] HERO,  FNP Taking Active Child, Self  spironolactone  (ALDACTONE ) 25 MG tablet [id number] Yes Take 1 tablet (25 mg total) by mouth daily. Raford Riggs, MD Taking Active Child, Self           Med Note LORENDA, Loribeth Katich A   Fri Jan 07, 2023 10:25 AM) This medication discontinued per daughter  thiamine  (VITAMIN B1) 100 MG tablet [id number] Yes Take 1 tablet (100 mg total) by mouth daily. Jennelle Riis, MD Taking Active   TRUE METRIX BLOOD GLUCOSE TEST test strip [id number] No 1 each by Other route 4 (four) times daily as needed.  Patient not taking:  Reported on 01/07/2023   [provider] Not Taking Active Child, Self  TRUEplus Lancets 33G MISC [id number] No Test BS up to four times daily as needed Dx E11.9  Patient not taking: Reported on 01/07/2023   Merlynn Niki FALCON, FNP Not Taking Active Child, Self  valsartan  (DIOVAN ) 320 MG tablet [id number] Yes Take 1 tablet (320 mg total) by mouth daily. Raford Riggs, MD Taking Active Child, Self  vitamin B-12 (CYANOCOBALAMIN ) 1000 MCG tablet [id number] Yes Take 1,000 mcg by mouth daily. [provider] Taking Active Child, Self  Vitamin D , Ergocalciferol , (DRISDOL ) 1.25 MG (50000 UNIT) CAPS capsule [id number] Yes Take 1 capsule (50,000 Units total) by mouth every 7 (seven) days. Joesph Riggs HERO, FNP Taking Active Child, Self            Home Care and Equipment/Supplies: Were Home Health Services Ordered?: Yes Name of Home Health Agency:: [facility]  [phone] Has Agency set up a time to come to your home?: No (t/c to [facility] spoke with Jenna and confirmed they will call pt/ daughter to set up visit for 01/08/23) EMR reviewed for Home Health Orders: Orders present/patient has not received call (refer to CM for follow-up) Any new equipment or medical supplies ordered?:  (pt was given [facility] before she left the hospital) Name of Medical supply agency?: [employer] Were you able to get the equipment/medical supplies?: Yes Do you have any questions related to the use of the equipment/supplies?: No  Functional Questionnaire: Do you need assistance with bathing/showering or dressing?: No Do you need assistance with meal preparation?: No Do you need assistance with eating?: No Do you have difficulty maintaining continence: No Do you need assistance with getting out of bed/getting out of a chair/moving?: No Do you have difficulty managing or taking your medications?: Yes (daughter assists if needed)  Follow up appointments reviewed: PCP Follow-up appointment confirmed?:  (patient's  daughter [name] states she is calling this morning to make PCP appt  that will accomodate her schedule) MD Provider Line [phone] Given: No Specialist Hospital Follow-up appointment confirmed?: No Reason Specialist Follow-Up Not Confirmed:  (pts daughter has number for GI and will call if needed, does not have name of oncologist or contact number) Do you need transportation to your follow-up appointment?: No Do you understand care options if your condition(s) worsen?: Yes-patient verbalized understanding  SDOH Interventions Today    Flowsheet Row Most Recent Value  SDOH Interventions   Food Insecurity Interventions Intervention Not Indicated  Housing Interventions Intervention Not Indicated  Transportation Interventions Intervention Not Indicated  Utilities Interventions Intervention Not Indicated       Mliss Creed [facility], BSN RN Care Manager/ Transition of Care [place]/ [facility] [phone]

## 2023-01-07 NOTE — Progress Notes (Signed)
 [facility] [phone] [facility] Liaison Note   Notified by Tobey Jama Barnacle, NP of patient/family request for [facility] Palliative services at home after discharge.   Referral received.   Please call with any questions.   Thank you, Eleanor Nail, [facility] Liaison [phone]

## 2023-01-07 NOTE — Telephone Encounter (Signed)
 Copied from CRM [id number]. Topic: Clinical - Medication Question >> Jan 07, 2023 10:51 AM Kinnie DEL wrote: Reason for CRM: need to discuss medications   Chief Complaint: Med question Symptoms: No acute symptoms currently Pertinent Negatives: Patient denies worsening of symptoms at this time Disposition: [] ED /[] Urgent Care (no appt availability in office) / [] Appointment(In office/virtual)/ []  [facility]/ [] Home Care/ [] Refused Recommended Disposition /[] [place] [facility]/ [x]  Follow-up with PCP Additional Notes: Pt's daughter reporting pt was discharged from hospital stay last night, was experiencing some weakness which led to admission but feeling stronger now and eating some. Daughter asking to confirm which med not to give pt since pt mentioned there were some the PCP told pt to d/c. Daughter reporting that she will be taking over med administration for pt. Per pt chart notes, advised that pt's glipizide  was d/c'd in October 2024. Advised follow the discharge summary from the hospital. Daughter reporting that pt is not going to take the PROSource Plus feeding supplement she was prescribed due to the taste. Advised follow up with PCP, daughter scheduled with agent already. Placed appt on wait list. Advised to call with further questions. Please advise if further recommendations, especially in regards to feeding supplement pt is refusing.  Reason for Disposition  Caller has medicine question only, adult not sick, AND triager answers question  Answer Assessment - Initial Assessment Questions 1. NAME of MEDICINE: What medicine(s) are you calling about?     Recently got out hospital last night, daughter wanting to know meds and how to take 2. QUESTION: What is your question? (e.g., double dose of medicine, side effect)     Took her off of plavacid and aspirin , took the heparin  away and put her on amlodipine  3. PRESCRIBER: Who prescribed the medicine? Reason: if prescribed by  specialist, call should be referred to that group.     Meds been prescribed by PCP and cardiologist and hospital 4. SYMPTOMS: Do you have any symptoms? If Yes, ask: What symptoms are you having?  How bad are the symptoms (e.g., mild, moderate, severe)     Little bit stronger now and eating a bit  Protocols used: Medication Question Call-A-AH

## 2023-01-08 LAB — CULTURE, BLOOD (ROUTINE X 2)
Culture: NO GROWTH
Culture: NO GROWTH
Special Requests: ADEQUATE

## 2023-01-10 ENCOUNTER — Telehealth: Payer: Self-pay | Admitting: Family Medicine

## 2023-01-10 NOTE — Telephone Encounter (Signed)
 FYI

## 2023-01-10 NOTE — Telephone Encounter (Signed)
 Copied from CRM [id number]. Topic: General - Other >> Jan 10, 2023  3:04 PM Herbert Seta B wrote: Reason for CRM: Eileen Stanford from [facility] calling to inform PCP that patient is requesting to delay services until after January 10th.

## 2023-01-11 NOTE — Progress Notes (Signed)
 Palliative Medicine [facility]  [phone] [phone]   Name: Heather Oconnor Date: 01/11/2023 MRN: [id number]  DOB: 05/24/1940  Patient Care Team: [name] HERO, FNP as PCP - General (Family Medicine) Lavona Agent, MD as PCP - Cardiology (Cardiology) Shaaron Lamar HERO, MD as Consulting Physician (Gastroenterology) Bond, Reyes Mcardle, MD as Consulting Physician (Ophthalmology) Gladis Gearing, OD (Optometry) Aura Mliss LABOR, RN as [facility] Care Management    REASON FOR CONSULTATION: Heather Oconnor is a 83 y.o. female with oncologic medical history including newly diagnosed  hepatocellular carcinoma (01/2023). Past history also includes resistant HTN, CVA, CAD, T2DM, GERD, and IBS. Palliative ask to see for symptom management and goals of care.    SOCIAL HISTORY:     reports that she quit smoking about 16 years ago. Her smoking use included cigarettes. She started smoking about 25 years ago. She has never used smokeless tobacco. She reports that she does not drink alcohol and does not use drugs.  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     ALLERGIES:  is allergic to hctz [hydrochlorothiazide] and lisinopril .  MEDICATIONS:  Current Outpatient Medications  Medication Sig Dispense Refill   amLODipine  (NORVASC ) 10 MG tablet TAKE 1 TABLET EVERY DAY 90 tablet 1   apixaban  (ELIQUIS ) 5 MG TABS tablet Take 2 tablets (10 mg total) by mouth 2 (two) times daily for 6 days, THEN 1 tablet (5 mg total) 2 (two) times daily. 72 tablet 0   atorvastatin  (LIPITOR) 40 MG tablet TAKE 1 TABLET EVERY DAY (Patient not taking: Reported on 01/07/2023) 30 tablet 0   bisoprolol  (ZEBETA ) 5 MG tablet TAKE 1 AND 1/2 TABLETS EVERY DAY (Patient taking differently: Take 7.5 mg by mouth daily.) 135 tablet 0   cloNIDine  (CATAPRES ) 0.2 MG tablet TAKE 1 TABLET TWICE DAILY 180 tablet 1   dorzolamide -timolol  (COSOPT ) 22.3-6.8 MG/ML ophthalmic solution Place 1 drop into both eyes 2 (two) times daily.     hydrALAZINE  (APRESOLINE ) 100 MG tablet TAKE 1 TABLET THREE TIMES DAILY 270 tablet 1   metFORMIN  (GLUCOPHAGE ) 500 MG tablet TAKE 2 TABLETS TWICE DAILY WITH MEALS (Patient taking differently: Take 1,000 mg by mouth 2 (two) times daily with a meal. Patient has only been taking one tablet twice daily due to illness. 01/03/2023.) 360 tablet 1   mirtazapine  (REMERON ) 15 MG tablet TAKE 1 TABLET AT BEDTIME 90 tablet 3   Multiple Vitamin (MULTIVITAMIN WITH MINERALS) TABS tablet Take 1 tablet by mouth daily. 30 tablet 0   Nutritional Supplements (,FEEDING SUPPLEMENT, PROSOURCE PLUS) liquid Take 30 mLs by mouth 2 (two) times daily between meals. (Patient  not taking: Reported on 01/07/2023)     ondansetron  (ZOFRAN ) 4 MG tablet Take 1 tablet (4 mg total) by mouth every 8 (eight) hours as needed for nausea or vomiting. 20 tablet 0   pantoprazole  (PROTONIX ) 40 MG tablet TAKE 1 TABLET EVERY DAY 90 tablet 1   spironolactone  (ALDACTONE ) 25 MG tablet Take 1 tablet (25 mg total) by mouth daily. 90 tablet 3   thiamine  (VITAMIN B1) 100 MG tablet Take 1 tablet (100 mg total) by mouth daily. 30  tablet 0   TRUE METRIX BLOOD GLUCOSE TEST test strip 1 each by Other route 4 (four) times daily as needed. (Patient not taking: Reported on 01/07/2023)     TRUEplus Lancets 33G MISC Test BS up to four times daily as needed Dx E11.9 (Patient not taking: Reported on 01/07/2023) 400 each 3   valsartan  (DIOVAN ) 320 MG tablet Take 1 tablet (320 mg total) by mouth daily. 90 tablet 1   vitamin B-12 (CYANOCOBALAMIN ) 1000 MCG tablet Take 1,000 mcg by mouth daily.     Vitamin D , Ergocalciferol , (DRISDOL ) 1.25 MG (50000 UNIT) CAPS capsule Take 1 capsule (50,000 Units total) by mouth every 7 (seven) days. 12 capsule 0   No current facility-administered medications for this visit.    VITAL SIGNS: There were no vitals taken for this visit. There were no vitals filed for this visit.  Estimated body mass index is 24.09 kg/m as calculated from the following:   Height as of 12/30/22: 5' 3 (1.6 m).   Weight as of 12/30/22: 136 lb (61.7 kg).  LABS: CBC:    Component Value Date/Time   WBC 7.7 01/06/2023 0235   HGB 9.5 (L) 01/06/2023 0235   HGB 12.2 12/30/2022 0853   HCT 30.2 (L) 01/06/2023 0235   HCT 38.5 12/30/2022 0853   PLT 286 01/06/2023 0235   PLT 324 12/30/2022 0853   MCV 86.5 01/06/2023 0235   MCV 88 12/30/2022 0853   NEUTROABS 9.8 (H) 12/30/2022 0853   LYMPHSABS 1.2 12/30/2022 0853   MONOABS 0.7 07/05/2018 0130   EOSABS 0.1 12/30/2022 0853   BASOSABS 0.1 12/30/2022 0853   Comprehensive Metabolic Panel:    Component Value Date/Time   NA 138 01/06/2023 0235   NA 141 12/30/2022 0853   K 3.6 01/06/2023 0235   CL 103 01/06/2023 0235   CO2 24 01/06/2023 0235   BUN 22 01/06/2023 0235   BUN 15 12/30/2022 0853   CREATININE 0.82 01/06/2023 0235   GLUCOSE 103 (H) 01/06/2023 0235   CALCIUM  8.4 (L) 01/06/2023 0235   AST 40 01/06/2023 0235   ALT 14 01/06/2023 0235   ALKPHOS 102 01/06/2023 0235   BILITOT 0.6 01/06/2023 0235   BILITOT 0.7 12/30/2022 0853   PROT 5.4 (L) 01/06/2023 0235   PROT  6.1 12/30/2022 0853   ALBUMIN 1.9 (L) 01/06/2023 0235   ALBUMIN 3.7 12/30/2022 0853    RADIOGRAPHIC STUDIES: CT CHEST W CONTRAST Result Date: 01/05/2023 CLINICAL DATA:  Hepatocellular carcinoma, staging. * Tracking Code: BO * EXAM: CT CHEST WITH CONTRAST TECHNIQUE: Multidetector CT imaging of the chest was performed during intravenous contrast administration. RADIATION DOSE REDUCTION: This exam was performed according to the departmental dose-optimization program which includes automated exposure control, adjustment of the mA and/or kV according to patient size and/or use of iterative reconstruction technique. CONTRAST:  50mL OMNIPAQUE  IOHEXOL  350 MG/ML SOLN COMPARISON:  06/16/2018 chest CT angiogram.  12/25/2018 CT abdomen. FINDINGS: Cardiovascular: Borderline mild cardiomegaly. Small pericardial effusion. Three-vessel coronary atherosclerosis. Atherosclerotic  nonaneurysmal thoracic aorta. Normal caliber pulmonary arteries. No central pulmonary emboli. Mediastinum/Nodes: Peripherally calcified centrally hypodense 0.9 cm right thyroid  nodule. Unremarkable esophagus. No pathologically enlarged axillary, mediastinal or hilar lymph nodes. Lungs/Pleura: No pneumothorax. Trace posterior bilateral pleural effusions. Moderate centrilobular emphysema with mild diffuse bronchial wall thickening. Solid slightly spiculated 1.1 cm posteromedial right lower lobe pulmonary nodule (series 4/image 94), with this lung region previously obscured by atelectasis on 06/16/2018 chest CT. No acute consolidative airspace disease, lung masses or additional significant pulmonary nodules. 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 Trace upper abdominal ascites. Musculoskeletal: No aggressive appearing focal osseous lesions. Marked thoracic spondylosis. IMPRESSION: 1.  Solid slightly spiculated 1.1 cm posteromedial right lower lobe pulmonary nodule, suspicious for pulmonary metastasis. 2. No thoracic adenopathy. 3. Trace posterior bilateral pleural effusions. 4. Borderline mild cardiomegaly. Small pericardial effusion. Three-vessel coronary atherosclerosis. 5. 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 Trace upper abdominal ascites. Please see the recent MRI abdomen report for further details. 6. Aortic Atherosclerosis (ICD10-I70.0) and Emphysema (ICD10-J43.9). Electronically Signed   By: Selinda DELENA Blue M.D.   On: 01/05/2023 19:44   MR ABDOMEN MRCP W WO CONTAST Result Date: 01/04/2023 CLINICAL DATA:  Biliary obstruction. Liver metastasis suspected on ultrasound. EXAM: MRI ABDOMEN WITHOUT AND WITH CONTRAST (INCLUDING MRCP) TECHNIQUE: Multiplanar multisequence MR imaging of the abdomen was performed both before and after the administration of intravenous contrast. Heavily T2-weighted images of the biliary and pancreatic ducts were obtained, and three-dimensional MRCP images were rendered by post processing. CONTRAST:  7mL GADAVIST  GADOBUTROL  1 MMOL/ML IV SOLN COMPARISON:  Ultrasound 12/30/2022 FINDINGS: Lower chest:  Lung bases are clear. Hepatobiliary: Multiple round enhancing lesions within LEFT and RIGHT hepatic lobe. Lesions reaches confluence in the lateral segment of LEFT hepatic lobe which is nearly complete replaced by enhancing lesions. These lesions demonstrate rapid early arterial enhancement with washout and peripheral pseudo capsules. (Series 1501) The confluent malignant lesions in the LEFT lateral hepatic lobe extend and expand the LEFT portal vein to 18 mm (image 43/series 1504). Findings consistent with tumor thrombus. This thrombus extends to the bifurcation of the main portal vein and partially involves the proximal RIGHT portal vein (image 48). There is biliary duct  dilatation in the segment 4A of the liver. Gallbladder is distended to 58 mm. Several dependent gallstones are present. There several filling defects within distal common bile duct noted on coronal image 18/series 7. Filling defects stack at the distal common bile duct. The common bile duct is dilated to 11 mm. Several enlarged lymph nodes in the porta hepatis including 19 mm node on image 57/1502. Lymph node adjacent the head of the pancreas measures 11 mm image 73. Pancreas: Normal pancreatic parenchymal intensity. No ductal dilatation or inflammation. Spleen: Normal spleen. Adrenals/urinary tract: Adrenal glands and kidneys are normal. Stomach/Bowel: Stomach and limited of the small bowel is unremarkable Vascular/Lymphatic: Abdominal aortic normal caliber. No retroperitoneal periportal lymphadenopathy. Musculoskeletal: No aggressive osseous lesion IMPRESSION: 1. Multifocal bilobar hepatic malignant tumors. Lesions reach confluence within the LEFT lateral hepatic lobe. 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 Enhancement pattern and portal vein tumor thrombosis are typical of hepatocellular carcinoma. 2. Porta hepatis lymphadenopathy. 3. Distal choledocholithiasis mild extrahepatic  duct dilatation. Gallbladder distension. These results will be called to the ordering clinician or representative by the Radiologist Assistant, and communication documented in the PACS or [employer]. Electronically Signed   By: Jackquline Boxer M.D.   On: 01/04/2023 08:59   PERFORMANCE STATUS (ECOG) : 1 - Symptomatic but completely ambulatory  Review of Systems  Constitutional:  Positive for activity change, appetite change and fatigue.  Unless otherwise noted, a complete review of systems is negative.  Physical Exam General: NAD Cardiovascular: regular rate and rhythm Pulmonary: clear ant fields Abdomen: soft,  nontender, + bowel sounds Extremities: no edema, no joint deformities Skin: no rashes Neurological: Alert and oriented x3  IMPRESSION:  This is my initial visit with Ms. Peter here at the cancer center. She was initially seen by our palliative team during recent hospitalization. Her daughter and son-in-law are present with her. No acute distress. Patient is alert and able to engage appropriately in discussions.   I introduced myself, Maygan RN, and Palliative's role in collaboration with the oncology team. Concept of Palliative Care was introduced as specialized medical care for people and their families living with serious illness.  It focuses on providing relief from the symptoms and stress of a serious illness.  The goal is to improve quality of life for both the patient and the family. Values and goals of care important to patient and family were attempted to be elicited.   Ms. Tomkiewicz lives in the home alone. Retired from the [employer] She has great family support who has been caring for her since recent illness. Months prior she was very independent, including driving up until Christmas. Enjoys watching her daily [employer].  The patient has been using a walker for mobility at home. Her recent health changes have led to the introduction of home health services, including a physical therapist to help regain strength and independence.   Ms. Abdelrahman states she has been experiencing difficulty with medication management, specifically struggling with swallowing pills due to a sensation of choking and subsequent nausea. The patient reports a significant reduction in appetite, finding most foods, particularly dry foods like bread, unpalatable. Education provided on focusing on small frequent meals. Also incorporating gravies and sauces to her meals/meats to allow for better tolerance given her biggest concern is the feeling of dryness and foods getting stuck. Despite hunger, the patient is unable  to consume many solid foods, preferring liquids or soft foods like applesauce. She has been trying homemade protein shakes made by her family with almond milk, fruits, and protein powder, which have been well-tolerated and enjoyed. Recommended lemon and ginger teas, water, or candies to assist with neutralizing her taste buds as she expresses the taste of her medications causes her also to not want to eat.   We reviewed her medications at length. She also has reviewed with Dr. Autumn We determined given such difficulty patient is able to minimize medications by eliminating daily vitamins. We also discussed taking medications with applesauce or pudding for additional support.  She and family verbalized understanding and appreciation.    The patient has been experiencing diarrhea, which has been managed with Imodium. The patient's daughter has been assisting with medication management and meal preparation, visiting multiple times a day.  We discussed her current illness and what it means in the larger context of her on-going co-morbidities. Natural disease trajectory and expectations were discussed.  Ms. Herbison and her family is realistic in their understanding of her current diagnosis. They are  clear in expressed wishes to take things one day at a time while treating the treatable allowing Ms. Moore to do as well as she can for as long as she can. They have made the decision to pursue immunotherapy, with the hope of improving strength and quality of life. Patient is clear that her quality of life is most important over her quantity.  I discussed the importance of continued conversation with family and their medical providers regarding overall plan of care and treatment options, ensuring decisions are within the context of the patients values and GOCs.  PLAN: Established therapeutic relationship. Education provided on palliative's role in collaboration with their Oncology/Radiation team.  Difficulty  swallowing pills Patient reports difficulty swallowing pills, causing discomfort and a sensation of choking. Discussed various strategies to facilitate pill swallowing, including using applesauce, pudding, and lemon/ginger candies. -Continue to use applesauce or pudding to facilitate pill swallowing. -Try using lemon/ginger candies before and after pill intake to neutralize taste.  Decreased appetite and altered taste Patient reports decreased appetite and altered taste, particularly finding dry foods difficult to consume. Discussed the use of sauces, condiments, and moist foods to improve food intake. -Encourage intake of moist foods and use of sauces/condiments to improve taste and facilitate swallowing. -Consider protein shakes and Boost Breeze as alternative sources of nutrition. Samples provided.   Diarrhea Patient reports episodes of diarrhea, currently managed with Imodium. -Continue Imodium as needed for diarrhea.  Cancer treatment Discussed the plan to start immunotherapy, with the hope of improving strength and independence. -Plan to start immunotherapy in the next 1-2 weeks per Oncology.  -Consider port placement for treatment administration.  Follow-up and communication Encouraged the use of MyChart for communication and follow-up. -Plan for physical therapy to improve strength and independence. -I will plan to see patient back in 2-3 weeks. Sooner if needed.   Patient expressed understanding and was in agreement with this plan. She also understands that She can call the clinic at any time with any questions, concerns, or complaints.   Thank you for your referral and allowing Palliative to assist in Mrs. Jama Mcmiller Guilmette's care.   Number and complexity of problems addressed: HIGH - 1 or more chronic illnesses with SEVERE exacerbation, progression, or side effects of treatment - advanced cancer, pain. Any controlled substances utilized were prescribed in the context of  palliative care.   Visit consisted of counseling and education dealing with the complex and emotionally intense issues of symptom management and palliative care in the setting of serious and potentially life-threatening illness.  Signed by: Levon Borer, AGPCNP-BC Palliative Medicine Team/[facility]

## 2023-01-12 ENCOUNTER — Other Ambulatory Visit: Payer: Self-pay | Admitting: *Deleted

## 2023-01-12 NOTE — Progress Notes (Signed)
 The proposed treatment discussed in conference is for discussion purpose only and is not a binding recommendation.  The patients have not been physically examined, or presented with their treatment options.  Therefore, final treatment plans cannot be decided.

## 2023-01-13 ENCOUNTER — Inpatient Hospital Stay: Payer: Medicare HMO | Attending: Oncology | Admitting: Oncology

## 2023-01-13 ENCOUNTER — Telehealth: Payer: Self-pay

## 2023-01-13 ENCOUNTER — Other Ambulatory Visit: Payer: Self-pay | Admitting: Family Medicine

## 2023-01-13 ENCOUNTER — Encounter: Payer: Self-pay | Admitting: Oncology

## 2023-01-13 ENCOUNTER — Inpatient Hospital Stay: Payer: Medicare HMO

## 2023-01-13 ENCOUNTER — Encounter: Payer: Self-pay | Admitting: Nurse Practitioner

## 2023-01-13 ENCOUNTER — Other Ambulatory Visit: Payer: Self-pay

## 2023-01-13 ENCOUNTER — Other Ambulatory Visit: Payer: Medicare HMO

## 2023-01-13 ENCOUNTER — Inpatient Hospital Stay (HOSPITAL_BASED_OUTPATIENT_CLINIC_OR_DEPARTMENT_OTHER): Payer: Medicare HMO | Admitting: Nurse Practitioner

## 2023-01-13 VITALS — BP 125/59 | HR 70 | Temp 97.7°F | Resp 14 | Wt 131.1 lb

## 2023-01-13 DIAGNOSIS — C220 Liver cell carcinoma: Secondary | ICD-10-CM | POA: Insufficient documentation

## 2023-01-13 DIAGNOSIS — Z79899 Other long term (current) drug therapy: Secondary | ICD-10-CM | POA: Diagnosis not present

## 2023-01-13 DIAGNOSIS — I81 Portal vein thrombosis: Secondary | ICD-10-CM

## 2023-01-13 DIAGNOSIS — E1121 Type 2 diabetes mellitus with diabetic nephropathy: Secondary | ICD-10-CM | POA: Diagnosis not present

## 2023-01-13 DIAGNOSIS — I119 Hypertensive heart disease without heart failure: Secondary | ICD-10-CM | POA: Insufficient documentation

## 2023-01-13 DIAGNOSIS — H5461 Unqualified visual loss, right eye, normal vision left eye: Secondary | ICD-10-CM | POA: Diagnosis not present

## 2023-01-13 DIAGNOSIS — R530 Neoplastic (malignant) related fatigue: Secondary | ICD-10-CM

## 2023-01-13 DIAGNOSIS — R1310 Dysphagia, unspecified: Secondary | ICD-10-CM | POA: Insufficient documentation

## 2023-01-13 DIAGNOSIS — I2510 Atherosclerotic heart disease of native coronary artery without angina pectoris: Secondary | ICD-10-CM | POA: Diagnosis not present

## 2023-01-13 DIAGNOSIS — Z515 Encounter for palliative care: Secondary | ICD-10-CM

## 2023-01-13 DIAGNOSIS — Z7984 Long term (current) use of oral hypoglycemic drugs: Secondary | ICD-10-CM | POA: Diagnosis not present

## 2023-01-13 DIAGNOSIS — Z7901 Long term (current) use of anticoagulants: Secondary | ICD-10-CM | POA: Insufficient documentation

## 2023-01-13 DIAGNOSIS — K219 Gastro-esophageal reflux disease without esophagitis: Secondary | ICD-10-CM | POA: Diagnosis not present

## 2023-01-13 DIAGNOSIS — R630 Anorexia: Secondary | ICD-10-CM

## 2023-01-13 DIAGNOSIS — Z7189 Other specified counseling: Secondary | ICD-10-CM | POA: Diagnosis not present

## 2023-01-13 DIAGNOSIS — Z87891 Personal history of nicotine dependence: Secondary | ICD-10-CM | POA: Diagnosis not present

## 2023-01-13 LAB — LACTATE DEHYDROGENASE: LDH: 171 U/L (ref 98–192)

## 2023-01-13 LAB — CBC WITH DIFFERENTIAL/PLATELET
Abs Immature Granulocytes: 0.09 10*3/uL — ABNORMAL HIGH (ref 0.00–0.07)
Basophils Absolute: 0.1 10*3/uL (ref 0.0–0.1)
Basophils Relative: 1 %
Eosinophils Absolute: 0.1 10*3/uL (ref 0.0–0.5)
Eosinophils Relative: 1 %
HCT: 37.3 % (ref 36.0–46.0)
Hemoglobin: 11.8 g/dL — ABNORMAL LOW (ref 12.0–15.0)
Immature Granulocytes: 1 %
Lymphocytes Relative: 11 %
Lymphs Abs: 1.6 10*3/uL (ref 0.7–4.0)
MCH: 26.9 pg (ref 26.0–34.0)
MCHC: 31.6 g/dL (ref 30.0–36.0)
MCV: 85 fL (ref 80.0–100.0)
Monocytes Absolute: 0.9 10*3/uL (ref 0.1–1.0)
Monocytes Relative: 7 %
Neutro Abs: 10.8 10*3/uL — ABNORMAL HIGH (ref 1.7–7.7)
Neutrophils Relative %: 79 %
Platelets: 547 10*3/uL — ABNORMAL HIGH (ref 150–400)
RBC: 4.39 MIL/uL (ref 3.87–5.11)
RDW: 15.7 % — ABNORMAL HIGH (ref 11.5–15.5)
WBC: 13.6 10*3/uL — ABNORMAL HIGH (ref 4.0–10.5)
nRBC: 0 % (ref 0.0–0.2)

## 2023-01-13 LAB — COMPREHENSIVE METABOLIC PANEL
ALT: 12 U/L (ref 0–44)
AST: 43 U/L — ABNORMAL HIGH (ref 15–41)
Albumin: 3.6 g/dL (ref 3.5–5.0)
Alkaline Phosphatase: 163 U/L — ABNORMAL HIGH (ref 38–126)
Anion gap: 8 (ref 5–15)
BUN: 16 mg/dL (ref 8–23)
CO2: 28 mmol/L (ref 22–32)
Calcium: 9.1 mg/dL (ref 8.9–10.3)
Chloride: 106 mmol/L (ref 98–111)
Creatinine, Ser: 0.92 mg/dL (ref 0.44–1.00)
GFR, Estimated: >60 mL/min (ref >60)
Glucose, Bld: 119 mg/dL — ABNORMAL HIGH (ref 70–99)
Potassium: 4.1 mmol/L (ref 3.5–5.1)
Sodium: 142 mmol/L (ref 135–145)
Total Bilirubin: 0.5 mg/dL (ref 0.0–1.2)
Total Protein: 6.7 g/dL (ref 6.5–8.1)

## 2023-01-13 LAB — PROTIME-INR
INR: 1.8 — ABNORMAL HIGH (ref 0.8–1.2)
Prothrombin Time: 21.1 s — ABNORMAL HIGH (ref 11.4–15.2)

## 2023-01-13 LAB — HEPATITIS PANEL, ACUTE
HCV Ab: NONREACTIVE
Hep A IgM: NONREACTIVE
Hep B C IgM: NONREACTIVE
Hepatitis B Surface Ag: NONREACTIVE

## 2023-01-13 MED ORDER — PROCHLORPERAZINE MALEATE 10 MG PO TABS: 10.0000 mg | ORAL_TABLET | Freq: Four times a day (QID) | ORAL | 1 refills | Status: DC | PRN

## 2023-01-13 MED ORDER — ONDANSETRON HCL 8 MG PO TABS: 8.0000 mg | ORAL_TABLET | Freq: Three times a day (TID) | ORAL | 1 refills | Status: DC | PRN

## 2023-01-13 MED ORDER — LIDOCAINE-PRILOCAINE 2.5-2.5 % EX CREA: TOPICAL_CREAM | CUTANEOUS | 3 refills | Status: DC

## 2023-01-13 MED ORDER — APIXABAN 5 MG PO TABS: 5.0000 mg | ORAL_TABLET | Freq: Two times a day (BID) | ORAL | 5 refills | Status: AC

## 2023-01-13 NOTE — Assessment & Plan Note (Signed)
 Difficulty swallowing multiple medications causing distress and nausea. Reviewed and adjusted current medications, emphasizing the importance of continuing Eliquis  for anticoagulation. - Reduce metformin  to one tablet twice daily - Discontinue multivitamins - Hold spironolactone  if blood pressure remains well-controlled - Continue Eliquis  - Review and adjust other medications as needed

## 2023-01-13 NOTE — Assessment & Plan Note (Signed)
 Significant anorexia and difficulty with solid foods, likely secondary to liver cancer and altered taste perception. Discussed appetite stimulants to improve intake.

## 2023-01-13 NOTE — Assessment & Plan Note (Addendum)
 Confirmed diagnosis based on MRI findings and elevated ASP marker (107). 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   Discussed her case in GI conference on 01/12/2023.  Depending on performance status, she will be considered for liver directed therapy versus systemic treatments.  -Discussed diagnosis, prognosis, staging, treatment options.  Reviewed NCCN guidelines.    -Her performance status was fairly good until recent hospitalization.  Even now she is trying to be independent as much as she can.   -Discussed several systemic treatment options.   Plan to proceed with single agent durvalumab  treatments.  We will avoid tremelimumab because of her visual disturbances.  Also avoiding Avastin since we cannot get EGD to rule out varices.  Explained immunotherapy mechanism, potential for survival extension, and quality of life improvement. Risks include auto-immune side effects. Patient and family opted for immunotherapy alone due to eye issues and potential side effects of combination therapy.  - Arrange immunotherapy education session  - Request port placement for IV access  - Initiate durvalumab  hopefully next week and continue every four weeks.  She will receive 1500 mg dose IV.

## 2023-01-13 NOTE — Progress Notes (Signed)
 START ON PATHWAY REGIMEN - Hepatobiliary     Cycle 1: A cycle is 28 days:     Tremelimumab-actl      Durvalumab    Cycles 2 and beyond: A cycle is every 28 days:     Durvalumab   **Always confirm dose/schedule in your pharmacy ordering system**  Patient Characteristics: Hepatocellular Carcinoma, Unresectable or Nonsurgical Candidate, Locally Advanced or Metastatic Disease Not Amenable to Locoregional Therapy, Systemic Therapy, First Line, No Prior Transplant and Candidate for Immunotherapy Hepatobiliary Disease Type: Hepatocellular Carcinoma Line of Therapy: First Line Intent of Therapy: Non-Curative / Palliative Intent, Discussed with Patient

## 2023-01-13 NOTE — Progress Notes (Signed)
 [facility]  ONCOLOGY CONSULT NOTE   PATIENT NAME: Heather Oconnor   MR#: [id number] DOB: 15-Sep-1940  DATE OF SERVICE: 01/13/2023   [facility] follow-up  Patient Care Team: [name] HERO, FNP as PCP - General (Family Medicine) Lavona Agent, MD as PCP - Cardiology (Cardiology) Shaaron Lamar HERO, MD as Consulting Physician (Gastroenterology) Bond, Reyes Mcardle, MD as Consulting Physician (Ophthalmology) Gladis Gearing, OD (Optometry) Aura Mliss LABOR, RN as [facility] Care Management    CHIEF COMPLAINT/ PURPOSE OF CONSULTATION:   Newly diagnosed hepatocellular carcinoma  ASSESSMENT & PLAN:   Heather Oconnor is a 83 y.o. lady with a past medical history of CVA on Plavix , IBS, history of pancreatic pseudocysts, HLD, HTN, HFpEF, COPD, CKD stage 3b, ? H/o cirrhosis with normal follow up imaging per GI note in 09/2022, depression, was referred to our clinic for newly diagnosed hepatocellular carcinoma.  Hepatocellular carcinoma (HCC) Confirmed diagnosis based on MRI findings and elevated ASP marker (107). 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   Discussed her case in GI conference on 01/12/2023.  Depending on performance status, she will be considered for liver directed therapy versus systemic treatments.  -Discussed diagnosis, prognosis, staging, treatment options.  Reviewed NCCN guidelines.    -Her performance status was fairly good until recent hospitalization.  Even now she is trying to be independent as much as she can.   -Discussed several systemic treatment options.   Plan to proceed with single agent durvalumab  treatments.  We will avoid tremelimumab because of her visual disturbances.  Also avoiding Avastin since we cannot get EGD to rule out varices.  Explained immunotherapy mechanism, potential for survival extension, and quality of life improvement. Risks include auto-immune side effects.  Patient and family opted for immunotherapy alone due to eye issues and potential side effects of combination therapy.  - Arrange immunotherapy education session  - Request port placement for IV access  - Initiate durvalumab  hopefully next week and continue every four weeks.  She will receive 1500 mg dose IV.  Decreased appetite Significant anorexia and difficulty with solid foods, likely secondary to liver cancer and altered taste perception. Discussed appetite stimulants to improve intake.  Medication management Difficulty swallowing multiple medications causing distress and nausea. Reviewed and adjusted current medications, emphasizing the importance of continuing Eliquis  for anticoagulation. - Reduce metformin  to one tablet twice daily - Discontinue multivitamins - Hold spironolactone  if blood pressure remains well-controlled - Continue Eliquis  - Review and adjust other medications as needed    I reviewed lab results and outside records for this visit and discussed relevant results with the patient. Diagnosis, plan of care and treatment options were also discussed in detail with the patient. Opportunity provided to ask questions and answers provided to her apparent satisfaction. Provided instructions to call our clinic with any problems, questions or concerns prior to return visit. I recommended to continue follow-up with PCP and sub-specialists. She verbalized understanding and agreed with the plan. No barriers to learning was detected.  NCCN guidelines have been consulted in the planning of this patient's care.  [name], MD  01/13/2023 4:32 PM  [facility] - A DEPT OF [name] [address] [place] [address] Dept: [phone] Dept Fax: [phone]   HISTORY OF PRESENTING ILLNESS:   Discussed the use of AI scribe software for clinical note transcription with the patient, who gave verbal consent to  proceed.   I have reviewed her chart and materials related to her cancer extensively and collaborated history with the patient. Summary of oncologic history is as follows:  ONCOLOGY HISTORY:  She presented to the hospital on 01/03/2023 with 3-week history of diffuse abdominal pain, poor oral intake, nausea.  On exam she had diffuse tenderness to palpation without guarding or rebound.  Initial ultrasound in the ED showed cholelithiasis with possible cholecystitis.  She was also noted to have hyperbilirubinemia and transaminitis with dilated CBD on ultrasound.  Liver lesions, left portal vein thrombosis were also noted on ultrasound.  She was hospitalized for further evaluation and management.  General surgery, GI were consulted.  There was no role for surgery.  MRCP on 01/04/2023 confirmed portal vein thrombosis as well as multifocal hepatic masses, bilobar.  Tumor invaded the portal vein with large tumor thrombus within the left portal vein and early tumor thrombus involvement of the right portal vein.  Enhancement pattern and portal vein tumor thrombosis are typical of hepatocellular carcinoma.  There was also porta hepatis lymphadenopathy.  On 01/05/2023, CT chest showed solid, slightly spiculated 1.1 cm posteromedial right lower lobe pulmonary nodule, suspicious for pulmonary metastatic cyst.  No thoracic adenopathy.  AFP was elevated at 107 ng/mL on 01/05/2023.  Oncology service was consulted when patient was in the hospital.  Dr. Loretha evaluated the patient on 01/05/2023.  Since patient was not a candidate for surgery or transplant, liver directed therapies or systemic treatments were discussed.  Plan made to discuss her case in GI multidisciplinary conference.  Discussed her case in GI conference on 01/12/2023.  Depending on performance status, she will be considered for liver directed therapy versus systemic treatments.  Plan to proceed with single agent durvalumab  treatments.  We will avoid  tremelimumab because of her visual disturbances.  Also avoiding Avastin since we cannot get EGD to rule out varices.  INTERVAL HISTORY:  The patient reports feeling weak and has lost her appetite. She also expresses difficulty swallowing pills, which has led to distress during medication administration. The patient's daughter reports that the patient was previously independent and active, but her condition has significantly deteriorated since the cancer diagnosis. The patient's energy levels and appetite have notably decreased, and she reports a significant change in her taste perception, particularly finding dry foods unpalatable. The patient also reports a lack of strength in her legs and arms, which has affected her mobility.  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   Qualifier: Diagnosis of  By: Burnis Pulling     Pancreatitis    Transient cerebral ischemia 05/08/2008   Qualifier: Diagnosis of  By: Burnis Pulling      SURGICAL HISTORY: Past Surgical History:  Procedure Laterality Date   COLONOSCOPY  09/07/2007   RMR: anal canal/external hemorrhoids, redundant colon, left-sided diverticula, otherwise normal colonic mucosa    SOCIAL HISTORY: Social History   Socioeconomic History   Marital status: Widowed    Spouse name: Not on file   Number of children: 3   Years of education: Not on file   Highest education level: Not on file  Occupational History   Occupation: retired  Tobacco Use  Smoking status: Former    Current packs/day: 0.00    Types: Cigarettes    Start date: 11/26/1997    Quit date: 11/27/2006    Years since quitting: 16.1   Smokeless tobacco: Never  Vaping Use   Vaping status: Never Used   Substance and Sexual Activity   Alcohol use: No    Alcohol/week: 0.0 standard drinks of alcohol   Drug use: No   Sexual activity: Not Currently  Other Topics Concern   Not on file  Social History Narrative   Three children.  All live nearby.   Social Drivers of Corporate Investment Banker Strain: Low Risk  (12/07/2022)   Overall Financial Resource Strain (CARDIA)    Difficulty of Paying Living Expenses: Not hard at all  Food Insecurity: No Food Insecurity (01/07/2023)   Hunger Vital Sign    Worried About Running Out of Food in the Last Year: Never true    Ran Out of Food in the Last Year: Never true  Transportation Needs: No Transportation Needs (01/07/2023)   PRAPARE - Administrator, Civil Service (Medical): No    Lack of Transportation (Non-Medical): No  Physical Activity: Insufficiently Active (12/07/2022)   Exercise Vital Sign    Days of Exercise per Week: 3 days    Minutes of Exercise per Session: 30 min  Stress: No Stress Concern Present (12/07/2022)   [employer] of Occupational Health - Occupational Stress Questionnaire    Feeling of Stress : Not at all  Social Connections: Socially Isolated (01/04/2023)   Social Connection and Isolation Panel [NHANES]    Frequency of Communication with Friends and Family: Never    Frequency of Social Gatherings with Friends and Family: Never    Attends Religious Services: Never    Database Administrator or Organizations: No    Attends Banker Meetings: Never    Marital Status: Widowed  Intimate Partner Violence: Not At Risk (01/07/2023)   Humiliation, Afraid, Rape, and Kick questionnaire    Fear of Current or Ex-Partner: No    Emotionally Abused: No    Physically Abused: No    Sexually Abused: No    FAMILY HISTORY: Family History  Problem Relation Age of Onset   Hypertension Mother    Heart attack Father    Hypertension Father    Hypertension Sister    Hypertension Brother    Hypertension Brother     Diabetes Son    Hypertension Son    Colon cancer Neg Hx     ALLERGIES:  She is allergic to hctz [hydrochlorothiazide] and lisinopril .  MEDICATIONS:  Current Outpatient Medications  Medication Sig Dispense Refill   amLODipine  (NORVASC ) 10 MG tablet TAKE 1 TABLET EVERY DAY 90 tablet 1   atorvastatin  (LIPITOR) 40 MG tablet TAKE 1 TABLET EVERY DAY 30 tablet 0   bisoprolol  (ZEBETA ) 5 MG tablet TAKE 1 AND 1/2 TABLETS EVERY DAY 135 tablet 0   cloNIDine  (CATAPRES ) 0.2 MG tablet TAKE 1 TABLET TWICE DAILY 180 tablet 1   dorzolamide -timolol  (COSOPT ) 22.3-6.8 MG/ML ophthalmic solution Place 1 drop into both eyes 2 (two) times daily.     hydrALAZINE  (APRESOLINE ) 100 MG tablet TAKE 1 TABLET THREE TIMES DAILY 270 tablet 1   metFORMIN  (GLUCOPHAGE ) 500 MG tablet TAKE 2 TABLETS TWICE DAILY WITH MEALS (Patient taking differently: Take 1,000 mg by mouth 2 (two) times daily with a meal. Patient has only been taking one tablet twice daily due to illness. 01/03/2023.) 360 tablet  1   mirtazapine  (REMERON ) 15 MG tablet TAKE 1 TABLET AT BEDTIME 90 tablet 3   Multiple Vitamin (MULTIVITAMIN WITH MINERALS) TABS tablet Take 1 tablet by mouth daily. 30 tablet 0   ondansetron  (ZOFRAN ) 4 MG tablet Take 1 tablet (4 mg total) by mouth every 8 (eight) hours as needed for nausea or vomiting. 20 tablet 0   pantoprazole  (PROTONIX ) 40 MG tablet TAKE 1 TABLET EVERY DAY 90 tablet 1   spironolactone  (ALDACTONE ) 25 MG tablet Take 1 tablet (25 mg total) by mouth daily. 90 tablet 3   thiamine  (VITAMIN B1) 100 MG tablet Take 1 tablet (100 mg total) by mouth daily. 30 tablet 0   TRUE METRIX BLOOD GLUCOSE TEST test strip 1 each by Other route 4 (four) times daily as needed.     TRUEplus Lancets 33G MISC Test BS up to four times daily as needed Dx E11.9 400 each 3   valsartan  (DIOVAN ) 320 MG tablet Take 1 tablet (320 mg total) by mouth daily. 90 tablet 1   vitamin B-12 (CYANOCOBALAMIN ) 1000 MCG tablet Take 1,000 mcg by mouth daily.      Vitamin D , Ergocalciferol , (DRISDOL ) 1.25 MG (50000 UNIT) CAPS capsule Take 1 capsule (50,000 Units total) by mouth every 7 (seven) days. 12 capsule 0   apixaban  (ELIQUIS ) 5 MG TABS tablet Take 1 tablet (5 mg total) by mouth 2 (two) times daily. 60 tablet 5   lidocaine -prilocaine  (EMLA ) cream Apply to affected area once 30 g 3   ondansetron  (ZOFRAN ) 8 MG tablet Take 1 tablet (8 mg total) by mouth every 8 (eight) hours as needed for nausea or vomiting. 30 tablet 1   prochlorperazine  (COMPAZINE ) 10 MG tablet Take 1 tablet (10 mg total) by mouth every 6 (six) hours as needed for nausea or vomiting. 30 tablet 1   No current facility-administered medications for this visit.    REVIEW OF SYSTEMS:    Review of Systems  Constitutional:  Positive for appetite change and unexpected weight change.  Gastrointestinal:  Positive for abdominal pain, diarrhea and nausea.  Neurological:  Positive for numbness.    All other pertinent systems were reviewed with the patient and are negative.  PHYSICAL EXAMINATION:  ECOG PERFORMANCE STATUS: 2 - Symptomatic, <50% confined to bed  Vitals:   01/13/23 0922  BP: (!) 125/59  Pulse: 70  Resp: 14  Temp: 97.7 F (36.5 C)  SpO2: 97%   Filed Weights   01/13/23 0922  Weight: 131 lb 1.6 oz (59.5 kg)    Physical Exam Constitutional:      General: She is not in acute distress.    Appearance: Normal appearance.  HENT:     Head: Normocephalic and atraumatic.  Eyes:     General: No scleral icterus.    Conjunctiva/sclera: Conjunctivae normal.  Cardiovascular:     Rate and Rhythm: Normal rate and regular rhythm.     Heart sounds: Normal heart sounds.  Pulmonary:     Effort: Pulmonary effort is normal.     Breath sounds: Normal breath sounds.  Abdominal:     General: There is no distension.  Musculoskeletal:     Right lower leg: No edema.     Left lower leg: No edema.  Neurological:     General: No focal deficit present.     Mental Status: She is  alert and oriented to person, place, and time.  Psychiatric:     Comments: She was initially withdrawn but later on got comfortable and  more vocal.    LABORATORY DATA:   I have reviewed the data as listed.  Results for orders placed or performed in visit on 01/13/23  Hepatitis panel, acute  Result Value Ref Range   Hepatitis B Surface Ag NON REACTIVE NON REACTIVE   HCV Ab NON REACTIVE NON REACTIVE   Hep A IgM NON REACTIVE NON REACTIVE   Hep B C IgM NON REACTIVE NON REACTIVE  Protime-INR  Result Value Ref Range   Prothrombin Time 21.1 (H) 11.4 - 15.2 seconds   INR 1.8 (H) 0.8 - 1.2  Lactate dehydrogenase  Result Value Ref Range   LDH 171 98 - 192 U/L  CBC with Differential/Platelet  Result Value Ref Range   WBC 13.6 (H) 4.0 - 10.5 K/uL   RBC 4.39 3.87 - 5.11 MIL/uL   Hemoglobin 11.8 (L) 12.0 - 15.0 g/dL   HCT 62.6 63.9 - 53.9 %   MCV 85.0 80.0 - 100.0 fL   MCH 26.9 26.0 - 34.0 pg   MCHC 31.6 30.0 - 36.0 g/dL   RDW 84.2 (H) 88.4 - 84.4 %   Platelets 547 (H) 150 - 400 K/uL   nRBC 0.0 0.0 - 0.2 %   Neutrophils Relative % 79 %   Neutro Abs 10.8 (H) 1.7 - 7.7 K/uL   Lymphocytes Relative 11 %   Lymphs Abs 1.6 0.7 - 4.0 K/uL   Monocytes Relative 7 %   Monocytes Absolute 0.9 0.1 - 1.0 K/uL   Eosinophils Relative 1 %   Eosinophils Absolute 0.1 0.0 - 0.5 K/uL   Basophils Relative 1 %   Basophils Absolute 0.1 0.0 - 0.1 K/uL   Immature Granulocytes 1 %   Abs Immature Granulocytes 0.09 (H) 0.00 - 0.07 K/uL  Comprehensive metabolic panel  Result Value Ref Range   Sodium 142 135 - 145 mmol/L   Potassium 4.1 3.5 - 5.1 mmol/L   Chloride 106 98 - 111 mmol/L   CO2 28 22 - 32 mmol/L   Glucose, Bld 119 (H) 70 - 99 mg/dL   BUN 16 8 - 23 mg/dL   Creatinine, Ser 9.07 0.44 - 1.00 mg/dL   Calcium  9.1 8.9 - 10.3 mg/dL   Total Protein 6.7 6.5 - 8.1 g/dL   Albumin 3.6 3.5 - 5.0 g/dL   AST 43 (H) 15 - 41 U/L   ALT 12 0 - 44 U/L   Alkaline Phosphatase 163 (H) 38 - 126 U/L   Total  Bilirubin 0.5 0.0 - 1.2 mg/dL   GFR, Estimated >39 >39 mL/min   Anion gap 8 5 - 15     RADIOGRAPHIC STUDIES:  I have personally reviewed the radiological images as listed and agree with the findings in the report.  CT CHEST W CONTRAST Result Date: 01/05/2023 CLINICAL DATA:  Hepatocellular carcinoma, staging. * Tracking Code: BO * EXAM: CT CHEST WITH CONTRAST TECHNIQUE: Multidetector CT imaging of the chest was performed during intravenous contrast administration. RADIATION DOSE REDUCTION: This exam was performed according to the departmental dose-optimization program which includes automated exposure control, adjustment of the mA and/or kV according to patient size and/or use of iterative reconstruction technique. CONTRAST:  50mL OMNIPAQUE  IOHEXOL  350 MG/ML SOLN COMPARISON:  06/16/2018 chest CT angiogram.  12/25/2018 CT abdomen. FINDINGS: Cardiovascular: Borderline mild cardiomegaly. Small pericardial effusion. Three-vessel coronary atherosclerosis. Atherosclerotic nonaneurysmal thoracic aorta. Normal caliber pulmonary arteries. No central pulmonary emboli. Mediastinum/Nodes: Peripherally calcified centrally hypodense 0.9 cm right thyroid  nodule. Unremarkable esophagus. No pathologically enlarged axillary, mediastinal or  hilar lymph nodes. Lungs/Pleura: No pneumothorax. Trace posterior bilateral pleural effusions. Moderate centrilobular emphysema with mild diffuse bronchial wall thickening. Solid slightly spiculated 1.1 cm posteromedial right lower lobe pulmonary nodule (series 4/image 94), with this lung region previously obscured by atelectasis on 06/16/2018 chest CT. No acute consolidative airspace disease, lung masses or additional significant pulmonary nodules. 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 Trace upper abdominal  ascites. Musculoskeletal: No aggressive appearing focal osseous lesions. Marked thoracic spondylosis. IMPRESSION: 1. Solid slightly spiculated 1.1 cm posteromedial right lower lobe pulmonary nodule, suspicious for pulmonary metastasis. 2. No thoracic adenopathy. 3. Trace posterior bilateral pleural effusions. 4. Borderline mild cardiomegaly. Small pericardial effusion. Three-vessel coronary atherosclerosis. 5. 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 Trace upper abdominal ascites. Please see the recent MRI abdomen report for further details. 6. Aortic Atherosclerosis (ICD10-I70.0) and Emphysema (ICD10-J43.9). Electronically Signed   By: Selinda DELENA Blue M.D.   On: 01/05/2023 19:44   MR ABDOMEN MRCP W WO CONTAST Result Date: 01/04/2023 CLINICAL DATA:  Biliary obstruction. Liver metastasis suspected on ultrasound. EXAM: MRI ABDOMEN WITHOUT AND WITH CONTRAST (INCLUDING MRCP) TECHNIQUE: Multiplanar multisequence MR imaging of the abdomen was performed both before and after the administration of intravenous contrast. Heavily T2-weighted images of the biliary and pancreatic ducts were obtained, and three-dimensional MRCP images were rendered by post processing. CONTRAST:  7mL GADAVIST  GADOBUTROL  1 MMOL/ML IV SOLN COMPARISON:  Ultrasound 12/30/2022 FINDINGS: Lower chest:  Lung bases are clear. Hepatobiliary: Multiple round enhancing lesions within LEFT and RIGHT hepatic lobe. Lesions reaches confluence in the lateral segment of LEFT hepatic lobe which is nearly complete replaced by enhancing lesions. These lesions demonstrate rapid early arterial enhancement with washout and peripheral pseudo capsules. (Series 1501) The confluent malignant lesions in the LEFT lateral hepatic lobe extend and expand the LEFT portal vein to 18 mm (image 43/series 1504). Findings consistent with tumor thrombus. This thrombus extends to the  bifurcation of the main portal vein and partially involves the proximal RIGHT portal vein (image 48). There is biliary duct dilatation in the segment 4A of the liver. Gallbladder is distended to 58 mm. Several dependent gallstones are present. There several filling defects within distal common bile duct noted on coronal image 18/series 7. Filling defects stack at the distal common bile duct. The common bile duct is dilated to 11 mm. Several enlarged lymph nodes in the porta hepatis including 19 mm node on image 57/1502. Lymph node adjacent the head of the pancreas measures 11 mm image 73. Pancreas: Normal pancreatic parenchymal intensity. No ductal dilatation or inflammation. Spleen: Normal spleen. Adrenals/urinary tract: Adrenal glands and kidneys are normal. Stomach/Bowel: Stomach and limited of the small bowel is unremarkable Vascular/Lymphatic: Abdominal aortic normal caliber. No retroperitoneal periportal lymphadenopathy. Musculoskeletal: No aggressive osseous lesion IMPRESSION: 1. Multifocal bilobar hepatic malignant tumors. Lesions reach confluence within the LEFT lateral hepatic lobe. 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 Enhancement pattern and portal vein tumor thrombosis are typical of hepatocellular carcinoma. 2. Porta hepatis lymphadenopathy. 3. Distal choledocholithiasis mild extrahepatic duct dilatation. Gallbladder distension. These results will be called to the ordering clinician or representative by the Radiologist Assistant, and communication documented in the PACS or [employer]. Electronically  Signed   By: Jackquline Boxer M.D.   On: 01/04/2023 08:59   MR 3D Recon At Scanner Result Date: 01/04/2023 CLINICAL DATA:  Biliary obstruction. Liver metastasis suspected on ultrasound. EXAM: MRI ABDOMEN WITHOUT AND WITH CONTRAST (INCLUDING MRCP) TECHNIQUE: Multiplanar multisequence MR  imaging of the abdomen was performed both before and after the administration of intravenous contrast. Heavily T2-weighted images of the biliary and pancreatic ducts were obtained, and three-dimensional MRCP images were rendered by post processing. CONTRAST:  7mL GADAVIST  GADOBUTROL  1 MMOL/ML IV SOLN COMPARISON:  Ultrasound 12/30/2022 FINDINGS: Lower chest:  Lung bases are clear. Hepatobiliary: Multiple round enhancing lesions within LEFT and RIGHT hepatic lobe. Lesions reaches confluence in the lateral segment of LEFT hepatic lobe which is nearly complete replaced by enhancing lesions. These lesions demonstrate rapid early arterial enhancement with washout and peripheral pseudo capsules. (Series 1501) The confluent malignant lesions in the LEFT lateral hepatic lobe extend and expand the LEFT portal vein to 18 mm (image 43/series 1504). Findings consistent with tumor thrombus. This thrombus extends to the bifurcation of the main portal vein and partially involves the proximal RIGHT portal vein (image 48). There is biliary duct dilatation in the segment 4A of the liver. Gallbladder is distended to 58 mm. Several dependent gallstones are present. There several filling defects within distal common bile duct noted on coronal image 18/series 7. Filling defects stack at the distal common bile duct. The common bile duct is dilated to 11 mm. Several enlarged lymph nodes in the porta hepatis including 19 mm node on image 57/1502. Lymph node adjacent the head of the pancreas measures 11 mm image 73. Pancreas: Normal pancreatic parenchymal intensity. No ductal dilatation or inflammation. Spleen: Normal spleen. Adrenals/urinary tract: Adrenal glands and kidneys are normal. Stomach/Bowel: Stomach and limited of the small bowel is unremarkable Vascular/Lymphatic: Abdominal aortic normal caliber. No retroperitoneal periportal lymphadenopathy. Musculoskeletal: No aggressive osseous lesion IMPRESSION: 1. Multifocal bilobar hepatic  malignant tumors. Lesions reach confluence within the LEFT lateral hepatic lobe. 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 Enhancement pattern and portal vein tumor thrombosis are typical of hepatocellular carcinoma. 2. Porta hepatis lymphadenopathy. 3. Distal choledocholithiasis mild extrahepatic duct dilatation. Gallbladder distension. These results will be called to the ordering clinician or representative by the Radiologist Assistant, and communication documented in the PACS or [employer]. Electronically Signed   By: Jackquline Boxer M.D.   On: 01/04/2023 08:59   US  Abdomen Limited RUQ (LIVER/GB) Result Date: 01/03/2023 CLINICAL DATA:  151471 RUQ pain 151471 EXAM: ULTRASOUND ABDOMEN LIMITED RIGHT UPPER QUADRANT COMPARISON:  RIGHT upper quadrant ultrasound, 12/30/2022. CT AP, 12/25/2018. FINDINGS: Gallbladder: 1.3 cm dependent echogenic gallstone within a mildly distended gallbladder. Gallbladder wall measures greater than the upper level normal thickness, at 0.4 cm. Trace pericholecystic fluid. A POSITIVE sonographic Beverley sign was reported by sonographer. Common bile duct: Diameter: Distended, measuring up to 0.9 cm. Liver: Increased hepatic echogenicity, with a similar appearance of multifocal heterogeneously-echogenic hepatic masses, greatest within the LEFT hepatic lobe and measuring up to 5 cm. See key image. 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 The main portal vein appears patent on color Doppler imaging with normal direction of blood flow towards the liver. Other: No perihepatic free fluid. IMPRESSION: 1. Cholelithiasis with sonographic findings highly suspicious for acute calculus cholecystitis. 2. Common bile duct distention, suspicious for choledocholithiasis. Correlate with MRCP and obstructive liver enzymes. 3.  Multifocal hepatic masses, largest within  the LEFT lobe measuring up to 5 cm. These are incompletely characterized on this evaluation. Recommend dedicated, contrasted MR abdomen for further evaluation. These results will be called to the ordering clinician or representative by the Radiologist Assistant, and communication documented in the PACS or [employer]. Electronically Signed   By: Thom Hall M.D.   On: 01/03/2023 12:31   US  Abdomen Limited RUQ (LIVER/GB) Result Date: 12/30/2022 CLINICAL DATA:  Right upper quadrant pain and nausea for 3 weeks. EXAM: ULTRASOUND ABDOMEN LIMITED RIGHT UPPER QUADRANT COMPARISON:  CT on 12/25/2018 FINDINGS: Gallbladder: Gallbladder is poorly distended which limits evaluation. Probable 8 mm gallstone noted. Diffuse gallbladder wall thickening is seen which may be due to underdistention, although other possibilities include cholecystitis, hepatocellular disease, and pancreatitis. No sonographic Murphy sign noted by sonographer. Common bile duct: Diameter: 11 mm, which is mildly dilated. Liver: Multiple hepatic masses are seen which show variable echogenicity. Largest in the left lobe measures 4.8 cm. These are highly suspicious for liver metastases. Portal vein is patent on color Doppler imaging with normal direction of blood flow towards the liver. Other: None. IMPRESSION: Multiple hepatic masses, highly suspicious for liver metastases. Recommend abdomen MRI without and with contrast for further characterization. Nondistended gallbladder with probable cholelithiasis. Diffuse gallbladder wall thickening is nonspecific, and may be due to underdistention, although other considerations include cholecystitis, hepatocellular disease, and pancreatitis. Mildly dilated common bile duct, measuring 11 mm. This could also be further evaluated by MRI/MRCP. Electronically Signed   By: Norleen DELENA Kil M.D.   On: 12/30/2022 11:29    Orders Placed This Encounter  Procedures   Consent Attestation for Oncology Treatment    The  patient is informed of risks, benefits, side-effects of the prescribed oncology treatment. Potential short term and long term side effects and response rates discussed. After a long discussion, the patient made informed decision to proceed.:   Yes   Comprehensive metabolic panel    Standing Status:   Future    Number of Occurrences:   1    Expiration Date:   01/13/2024   CBC with Differential/Platelet    Standing Status:   Future    Number of Occurrences:   1    Expiration Date:   01/13/2024   Lactate dehydrogenase    Standing Status:   Future    Number of Occurrences:   1    Expiration Date:   01/13/2024   Protime-INR    Standing Status:   Future    Number of Occurrences:   1    Expiration Date:   01/13/2024   AFP tumor marker    Standing Status:   Future    Number of Occurrences:   1    Expiration Date:   01/13/2024   Hepatitis panel, acute    Standing Status:   Future    Number of Occurrences:   1    Expiration Date:   01/13/2024   CBC with Differential (Cancer Center Only)    Standing Status:   Future    Expected Date:   01/20/2023    Expiration Date:   01/20/2024   CMP (Cancer Center only)    Standing Status:   Future    Expected Date:   01/20/2023    Expiration Date:   01/20/2024   T4    Standing Status:   Future    Expected Date:   01/20/2023    Expiration Date:   01/20/2024   TSH  Standing Status:   Future    Expected Date:   01/20/2023    Expiration Date:   01/20/2024   PHYSICIAN COMMUNICATION ORDER    Thyroid  function tests at baseline and every 3rd cycle   ONCBCN PHYSICIAN COMMUNICATION 6    Tremelimumab-actl: this agent may cause ocular inflammatory toxicity. If signs/symptoms of ocular toxicity occur, urgent referral to ophthalmology is indicated. Corticosteroid eye drops should be initiated in symptomatic patients. Review drug package insert for specific recommendations.    CODE STATUS:  Code Status History     Date Active Date Inactive Code Status Order ID Comments User  Context   01/05/2023 1031 01/06/2023 2334 Do not attempt resuscitation (DNR) PRE-ARREST INTERVENTIONS DESIRED [id number]  Lafe Domino, DO Inpatient   01/03/2023 1721 01/05/2023 1031 Full Code [id number]  Lafe Domino, DO ED   07/04/2018 2357 07/14/2018 2145 Full Code [id number]  Charlton Evalene RAMAN, MD Inpatient   06/12/2018 1347 06/18/2018 1635 Full Code [id number]  Maree Bracken D, DO ED    Questions for Most Recent Historical Code Status (Order [id number])     Question Answer   If pulseless and not breathing No CPR or chest compressions.   In Pre-Arrest Conditions (Patient Has Pulse and Is Breathing) May intubate, use advanced airway interventions and cardioversion/ACLS medications if appropriate or indicated. May transfer to ICU.   Consent: Discussion documented in EHR or advanced directives reviewed            Future Appointments  Date Time Provider Department Center  01/14/2023 10:00 AM Aura Mliss LABOR, RN [facility] None  01/19/2023  7:30 AM [facility] ROOM [facility] None  01/19/2023  9:30 AM [facility] 1 [facility]IR [place]  01/20/2023 10:30 AM [name] HERO, FNP [facility] None  05/04/2023 10:00 AM [name] HERO, FNP [facility] None  12/08/2023  9:20 AM [facility]-ANNUAL WELLNESS VISIT [facility] None     I spent a total of 60 minutes during this encounter with the patient including review of chart and various tests results, discussions about plan of care and coordination of care plan.  This document was completed utilizing speech recognition software. Grammatical errors, random word insertions, pronoun errors, and incomplete sentences are an occasional consequence of this system due to software limitations, ambient noise, and hardware issues. Any formal questions or concerns about the content, text or information contained within the body of this dictation should be directly addressed to the provider for clarification.

## 2023-01-13 NOTE — Telephone Encounter (Signed)
 Communicated via phone call to daughter that tx is scheduled for Thursday, Jan. 16th at 10:30 and that her mother's port-a-cath appointment is Wednesday, Jan. 15th at 7:30 am. Instructed to have her mom be NPO after midnight.

## 2023-01-14 ENCOUNTER — Other Ambulatory Visit: Payer: Medicare HMO

## 2023-01-14 ENCOUNTER — Encounter: Payer: Self-pay | Admitting: *Deleted

## 2023-01-14 ENCOUNTER — Other Ambulatory Visit: Payer: Self-pay

## 2023-01-14 ENCOUNTER — Other Ambulatory Visit: Payer: Self-pay | Admitting: *Deleted

## 2023-01-14 DIAGNOSIS — C220 Liver cell carcinoma: Secondary | ICD-10-CM | POA: Diagnosis not present

## 2023-01-14 DIAGNOSIS — E1122 Type 2 diabetes mellitus with diabetic chronic kidney disease: Secondary | ICD-10-CM | POA: Diagnosis not present

## 2023-01-14 DIAGNOSIS — K8010 Calculus of gallbladder with chronic cholecystitis without obstruction: Secondary | ICD-10-CM | POA: Diagnosis not present

## 2023-01-14 DIAGNOSIS — I130 Hypertensive heart and chronic kidney disease with heart failure and stage 1 through stage 4 chronic kidney disease, or unspecified chronic kidney disease: Secondary | ICD-10-CM | POA: Diagnosis not present

## 2023-01-14 DIAGNOSIS — E43 Unspecified severe protein-calorie malnutrition: Secondary | ICD-10-CM | POA: Diagnosis not present

## 2023-01-14 DIAGNOSIS — I2510 Atherosclerotic heart disease of native coronary artery without angina pectoris: Secondary | ICD-10-CM | POA: Diagnosis not present

## 2023-01-14 DIAGNOSIS — I5032 Chronic diastolic (congestive) heart failure: Secondary | ICD-10-CM | POA: Diagnosis not present

## 2023-01-14 DIAGNOSIS — N1832 Chronic kidney disease, stage 3b: Secondary | ICD-10-CM | POA: Diagnosis not present

## 2023-01-14 DIAGNOSIS — I81 Portal vein thrombosis: Secondary | ICD-10-CM | POA: Diagnosis not present

## 2023-01-14 NOTE — Patient Instructions (Signed)
 Visit Information  Thank you for taking time to visit with me today. Please don't hesitate to contact me if I can be of assistance to you before our next scheduled telephone appointment.  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 Autumn on 01/13/23  RNCM Clinical Goal(s):  Patient will work with the Care Management team over the next 30 days to address Transition of Care Barriers: Provider appointments Home Health services verbalize understanding of plan for management of Liver carcinoma/ cholecystitis as evidenced by pt/ cg report, review of EMR and  through collaboration with RN Care manager, provider, and care team.   Interventions: Evaluation of current treatment plan related to  self management and patient's adherence to plan as established by provider Liver carcinoma/ cholecystits  (Status:  New goal. and Goal on track:  Yes.)  Short Term Goal Evaluation of current treatment plan related to  Liver carcinoma/ cholecystitis ,  home health, oncology follow up  self-management and patient's adherence to plan as established by provider. Discussed plans with patient for ongoing care management  follow up and provided patient with direct contact information for care management team Evaluation of current treatment plan related to liver carcinoma, cholecystitis and patient's adherence to plan as established by provider Reviewed medications with patient and discussed importance of taking as prescribed Discussed plans with patient for ongoing care management follow up and provided patient with direct contact information for care management team Reviewed upcoming scheduled appointments including primary care provider 01/28/23 @ 230 pm, porta cath placement on 01/19/23 @ 730 am Verified home health will be seeing pt today   Patient Goals/Self-Care Activities: Participate in Transition of Care Program/Attend [facility] scheduled calls Notify RN Care Manager of TOC call rescheduling needs Take all medications as prescribed Attend all scheduled provider appointments Call pharmacy for medication refills 3-7 days in advance of running out of medications Call provider office for new concerns or questions  Please follow up with primary care provider on 01/28/23 @ 230 pm Continue to work with [facility]  [phone]  Follow Up Plan:  Telephone follow up appointment with care management team member scheduled for:  01/21/23 @ 10 am The patient has been provided with contact information for the care management team and has been advised to call with any health related questions or concerns.           Our next appointment is by telephone on 01/21/23 at 10 am  Please call the care guide team at [phone] if you need to cancel or reschedule your appointment.   If you are experiencing a Mental Health or Behavioral Health Crisis  or need someone to talk to, please call the Suicide and Crisis Lifeline: 988 call the [place]  National Suicide Prevention Lifeline: (817) 593-2735 or TTY: 647-296-4204 TTY 204-265-6107) to talk to a trained counselor call 1-800-273-TALK (toll free, 24 hour hotline) go to  [facility] [address], [place] [phone]) call the [facility] Crisis Line: 8035489150 call 911   Patient verbalizes understanding of instructions and care plan provided today and agrees to view in MyChart. Active MyChart status and patient understanding of how to access instructions and care plan via MyChart confirmed with patient.     Mliss Creed [facility], BSN RN Care Manager/ Transition of Care [place]/ [facility] [phone]

## 2023-01-14 NOTE — Patient Outreach (Signed)
 Care Management  Transitions of Care Program Transitions of Care Post-discharge week 2   01/14/2023 Name: Heather Oconnor MRN: [id number] DOB: 04-16-1940  Subjective: Heather Oconnor is a 83 y.o. year old female who is a primary care patient of [name] HERO, FNP. The Care Management team Engaged with patient Engaged with patient by telephone to assess and address transitions of care needs.   Consent to Services:  Patient was given information about care management services, agreed to services, and gave verbal consent to participate.   Assessment: Patient saw oncologist on 01/13/23, to have porta cath placed next week and begin immunotherapy, patient's appetite/ eating has improved and pt is drinking protein shake once daily, home health PT will see pt today.         SDOH Interventions    Flowsheet Row Telephone from 01/07/2023 in  POPULATION HEALTH DEPARTMENT Clinical Support from 12/07/2022 in [facility] Office Visit from 01/06/2022 in [facility] Clinical Support from 12/02/2021 in [facility] Office Visit from 09/01/2021 in [facility] Office Visit from 03/03/2021 in [facility]  SDOH Interventions        Food Insecurity Interventions Intervention Not Indicated Intervention Not Indicated -- Intervention Not Indicated -- --  Housing Interventions Intervention Not Indicated Intervention Not Indicated -- Intervention Not Indicated -- --  Transportation Interventions Intervention Not Indicated Intervention Not Indicated -- -- -- --  Utilities Interventions Intervention Not Indicated Intervention Not Indicated -- -- -- --  Alcohol Usage Interventions -- Intervention Not Indicated (Score <7) -- -- -- --  Depression Interventions/Treatment  -- -- PHQ2-9 Score <4 Follow-up Not Indicated -- PHQ2-9 Score <4  Follow-up Not Indicated PHQ2-9 Score <4 Follow-up Not Indicated  Financial Strain Interventions -- Intervention Not Indicated -- Intervention Not Indicated -- --  Physical Activity Interventions -- Intervention Not Indicated -- Intervention Not Indicated -- --  Stress Interventions -- Intervention Not Indicated -- Intervention Not Indicated -- --  Social Connections Interventions -- Intervention Not Indicated -- Intervention Not Indicated -- --  Health Literacy Interventions -- Intervention Not Indicated -- -- -- --        Goals Addressed             This Visit's Progress    Transition of Care/ pt will have no readmissions within 30 days       Current Barriers:  Provider appointments per daughter she nor pt knows name of oncologist or phone number for pt follow up Home Health services [facility] has not contacted pt to schedule home visit Chronic Disease Management support and education needs 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 Autumn on 01/13/23  RNCM Clinical Goal(s):  Patient will work with the Care Management team over the next 30 days to address Transition of Care Barriers: Provider appointments Home Health services verbalize understanding of plan for management of Liver carcinoma/ cholecystitis as evidenced by pt/ cg report, review of EMR and  through collaboration with RN Care manager, provider, and care team.  Interventions: Evaluation of current treatment plan related to  self management and patient's adherence to plan as established by provider Liver carcinoma/ cholecystits  (Status:  New goal.  and Goal on track:  Yes.)  Short Term Goal Evaluation of current treatment plan related to  Liver carcinoma/ cholecystitis ,  home health, oncology follow up  self-management and patient's adherence to plan as established by provider. Discussed plans with patient for ongoing care management follow up and provided patient with direct contact information for care management team Evaluation of current treatment plan related to liver carcinoma, cholecystitis and patient's adherence to plan as established by provider Reviewed medications with patient and discussed importance of taking as prescribed Discussed plans with patient for ongoing care management follow up and provided patient with direct contact information for care management team Reviewed upcoming scheduled appointments including primary care provider 01/28/23 @ 230 pm, porta cath placement on 01/19/23 @ 730 am Verified home health will be seeing pt today   Patient Goals/Self-Care Activities: Participate in Transition of Care Program/Attend [facility] scheduled calls Notify RN Care Manager of TOC call rescheduling needs Take all medications as prescribed Attend all scheduled provider appointments Call pharmacy for medication refills 3-7 days in advance of running out of medications Call provider office for new concerns or questions  Please follow up with primary care provider on 01/28/23 @ 230 pm Continue to work with [facility]  [phone]  Follow Up Plan:  Telephone follow up appointment with care management team member scheduled for:  01/21/23 @ 10 am The patient has been provided with contact information for the care management team and has been advised to call with any health related questions or concerns.          Plan: Telephone follow up appointment with care management team member scheduled for:  01/21/23 @ 10 am  Mliss Creed [facility], BSN RN Care Manager/ Transition of Care [place] [phone]

## 2023-01-15 LAB — AFP TUMOR MARKER: AFP, Serum, Tumor Marker: 206 ng/mL — ABNORMAL HIGH (ref 0.0–8.7)

## 2023-01-16 ENCOUNTER — Encounter: Payer: Self-pay | Admitting: Oncology

## 2023-01-17 ENCOUNTER — Other Ambulatory Visit: Payer: Self-pay | Admitting: Radiology

## 2023-01-17 ENCOUNTER — Other Ambulatory Visit (HOSPITAL_COMMUNITY): Payer: Self-pay | Admitting: Radiology

## 2023-01-17 DIAGNOSIS — I5032 Chronic diastolic (congestive) heart failure: Secondary | ICD-10-CM | POA: Diagnosis not present

## 2023-01-17 DIAGNOSIS — K8010 Calculus of gallbladder with chronic cholecystitis without obstruction: Secondary | ICD-10-CM | POA: Diagnosis not present

## 2023-01-17 DIAGNOSIS — E1122 Type 2 diabetes mellitus with diabetic chronic kidney disease: Secondary | ICD-10-CM | POA: Diagnosis not present

## 2023-01-17 DIAGNOSIS — E43 Unspecified severe protein-calorie malnutrition: Secondary | ICD-10-CM | POA: Diagnosis not present

## 2023-01-17 DIAGNOSIS — N1832 Chronic kidney disease, stage 3b: Secondary | ICD-10-CM | POA: Diagnosis not present

## 2023-01-17 DIAGNOSIS — I130 Hypertensive heart and chronic kidney disease with heart failure and stage 1 through stage 4 chronic kidney disease, or unspecified chronic kidney disease: Secondary | ICD-10-CM | POA: Diagnosis not present

## 2023-01-17 DIAGNOSIS — I81 Portal vein thrombosis: Secondary | ICD-10-CM | POA: Diagnosis not present

## 2023-01-17 DIAGNOSIS — I2510 Atherosclerotic heart disease of native coronary artery without angina pectoris: Secondary | ICD-10-CM | POA: Diagnosis not present

## 2023-01-17 DIAGNOSIS — C220 Liver cell carcinoma: Secondary | ICD-10-CM | POA: Diagnosis not present

## 2023-01-17 NOTE — H&P (Signed)
 Chief Complaint: Patient was seen in consultation today for hepatocellular carcinoma  at the request of Pasam,Avinash  Referring Physician(s): Pasam,Avinash  Supervising Physician: Robbi Childs  Patient Status: [facility] - Out-pt  Full Code  History of Present Illness: Heather Oconnor is a 83 y.o. female with history of diabetes mellitus, CVA, HLD, GERD, hemorrhoids, IBS, pancreatitis, HTN, and cirrhosis. Patient is currently followed by Arlo Berber, MD with medical oncology. Patient's initial consult was 01/13/2023 for newly diagnosed hepatocellular carcinoma. MRI 01/03/23 reported multifocal bilobar hepatic malignant tumors. Patient was referred today for a port placement to assist with chemotherapy.   Patient reports being diagnosed with liver cancer. She reports feeling nervous today, due to never having a procedure or surgery in the past. Overall, patient feels well. She has not had anything to eat or drink since 6 pm last night. Patient's last dose of eliquis  was early yesterday 01/18/23 morning.   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 Authorizing Provider  amLODipine  (NORVASC ) 10 MG tablet TAKE 1 TABLET EVERY DAY 12/24/22   Albertha Huger, FNP  apixaban  (ELIQUIS ) 5 MG TABS tablet Take 1 tablet (5 mg total) by mouth 2 (two) times daily. 01/13/23 02/12/23  Pasam, Gale Jude, MD  atorvastatin  (LIPITOR) 40 MG tablet TAKE 1 TABLET EVERY DAY 10/18/22   Walker, Caitlin S, NP  bisoprolol  (ZEBETA ) 5 MG tablet TAKE 1 AND 1/2 TABLETS EVERY DAY 01/13/23   Albertha Huger, FNP  cloNIDine  (CATAPRES ) 0.2 MG tablet TAKE 1 TABLET TWICE DAILY 12/24/22   Albertha Huger, FNP  dorzolamide -timolol  (COSOPT ) 22.3-6.8 MG/ML ophthalmic solution Place 1 drop into both eyes 2 (two) times daily. 02/04/21   [provider]  hydrALAZINE  (APRESOLINE ) 100 MG tablet TAKE 1 TABLET THREE TIMES DAILY 12/24/22   Albertha Huger, FNP  lidocaine -prilocaine  (EMLA ) cream Apply to affected area once 01/13/23   Pasam, Avinash, MD  metFORMIN  (GLUCOPHAGE ) 500 MG tablet TAKE 2 TABLETS TWICE DAILY WITH MEALS Patient taking differently: Take 1,000 mg by mouth 2 (two) times daily with a meal. Patient has only been taking one tablet twice daily due to illness. 01/03/2023. 12/24/22   Albertha Huger, FNP  mirtazapine  (REMERON ) 15 MG tablet TAKE 1 TABLET AT BEDTIME 12/27/22   Albertha Huger, FNP  Multiple Vitamin (MULTIVITAMIN WITH MINERALS) TABS tablet Take 1 tablet by mouth daily. 01/06/23   Wilhemena Harbour, MD  ondansetron  (ZOFRAN ) 4 MG tablet Take 1 tablet (4 mg total) by mouth every 8 (eight) hours as needed for nausea or vomiting. 12/30/22   Galvin Jules, FNP  ondansetron  (ZOFRAN ) 8 MG tablet Take 1 tablet (8 mg total) by mouth every 8 (eight) hours as needed for nausea  or vomiting. 01/13/23   Pasam, Gale Jude, MD  pantoprazole  (PROTONIX ) 40 MG tablet TAKE 1 TABLET EVERY DAY 12/24/22   Albertha Huger, FNP  prochlorperazine  (COMPAZINE ) 10 MG tablet Take 1 tablet (10 mg total) by mouth every 6 (six) hours as needed for nausea or vomiting. 01/13/23   Pasam, Gale Jude, MD  spironolactone  (ALDACTONE ) 25 MG tablet Take 1 tablet (25 mg total) by mouth daily. 03/09/22    Maudine Sos, MD  thiamine  (VITAMIN B1) 100 MG tablet Take 1 tablet (100 mg total) by mouth daily. 01/06/23   Wilhemena Harbour, MD  TRUE METRIX BLOOD GLUCOSE TEST test strip 1 each by Other route 4 (four) times daily as needed. 01/29/20   [provider]  TRUEplus Lancets 33G MISC Test BS up to four times daily as needed Dx E11.9 01/25/20   Zorita Hiss, FNP  valsartan  (DIOVAN ) 320 MG tablet Take 1 tablet (320 mg total) by mouth daily. 09/03/22   Maudine Sos, MD  vitamin B-12 (CYANOCOBALAMIN ) 1000 MCG tablet Take 1,000 mcg by mouth daily.    [provider]  Vitamin D , Ergocalciferol , (DRISDOL ) 1.25 MG (50000 UNIT) CAPS capsule Take 1 capsule (50,000 Units total) by mouth every 7 (seven) days. 11/04/22   Albertha Huger, FNP     Family History  Problem Relation Age of Onset   Hypertension Mother    Heart attack Father    Hypertension Father    Hypertension Sister    Hypertension Brother    Hypertension Brother    Diabetes Son    Hypertension Son    Colon cancer Neg Hx     Social History   Socioeconomic History   Marital status: Widowed    Spouse name: Not on file   Number of children: 3   Years of education: Not on file   Highest education level: Not on file  Occupational History   Occupation: retired  Tobacco Use   Smoking status: Former    Current packs/day: 0.00    Types: Cigarettes    Start date: 11/26/1997    Quit date: 11/27/2006    Years since quitting: 16.1   Smokeless tobacco: Never  Vaping Use   Vaping status: Never Used  Substance and Sexual Activity   Alcohol use: No    Alcohol/week: 0.0 standard drinks of alcohol   Drug use: No   Sexual activity: Not Currently  Other Topics Concern   Not on file  Social History Narrative   Three children.  All live nearby.   Social Drivers of Corporate investment banker Strain: Low Risk  (12/07/2022)   Overall Financial Resource Strain (CARDIA)    Difficulty of Paying Living Expenses:  Not hard at all  Food Insecurity: No Food Insecurity (01/07/2023)   Hunger Vital Sign    Worried About Running Out of Food in the Last Year: Never true    Ran Out of Food in the Last Year: Never true  Transportation Needs: No Transportation Needs (01/07/2023)   PRAPARE - Administrator, Civil Service (Medical): No    Lack of Transportation (Non-Medical): No  Physical Activity: Insufficiently Active (12/07/2022)   Exercise Vital Sign    Days of Exercise per Week: 3 days    Minutes of Exercise per Session: 30 min  Stress: No Stress Concern Present (12/07/2022)   [employer] of Occupational Health - Occupational Stress Questionnaire    Feeling of Stress : Not at all  Social Connections: Socially Isolated (01/04/2023)  Social Advertising account executive [NHANES]    Frequency of Communication with Friends and Family: Never    Frequency of Social Gatherings with Friends and Family: Never    Attends Religious Services: Never    Database administrator or Organizations: No    Attends Banker Meetings: Never    Marital Status: Widowed    Review of Systems: A 12 point ROS discussed and pertinent positives are indicated in the HPI above.  All other systems are negative.  Review of Systems  Constitutional:  Negative for fever.  Respiratory:  Negative for cough and shortness of breath.   Cardiovascular:  Negative for chest pain.  Gastrointestinal:  Negative for abdominal pain.  Psychiatric/Behavioral:  Negative for confusion.     Vital Signs: BP (!) 132/59   Pulse 68   Temp 97.9 F (36.6 C) (Oral)   Resp 18   Ht 5\' 3"  (1.6 m)   Wt 131 lb 2.8 oz (59.5 kg)   SpO2 94%   BMI 23.24 kg/m   Advance Care Plan: The advanced care plan/surrogate decision maker was discussed at the time of visit and documented in the medical record.    Physical Exam HENT:     Head: Normocephalic and atraumatic.     Mouth/Throat:     Mouth: Mucous membranes are dry.      Pharynx: Oropharynx is clear.  Cardiovascular:     Rate and Rhythm: Normal rate and regular rhythm.  Pulmonary:     Effort: Pulmonary effort is normal. No respiratory distress.  Abdominal:     Palpations: Abdomen is soft.     Tenderness: There is abdominal tenderness (mild generalized tenderness throughout abdomen).  Skin:    General: Skin is warm.  Neurological:     General: No focal deficit present.     Mental Status: She is alert and oriented to person, place, and time.  Psychiatric:        Mood and Affect: Mood normal.        Thought Content: Thought content normal.        Judgment: Judgment normal.     Imaging: CT CHEST W CONTRAST Result Date: 01/05/2023 CLINICAL DATA:  Hepatocellular carcinoma, staging. * Tracking Code: BO * EXAM: CT CHEST WITH CONTRAST TECHNIQUE: Multidetector CT imaging of the chest was performed during intravenous contrast administration. RADIATION DOSE REDUCTION: This exam was performed according to the departmental dose-optimization program which includes automated exposure control, adjustment of the mA and/or kV according to patient size and/or use of iterative reconstruction technique. CONTRAST:  50mL OMNIPAQUE  IOHEXOL  350 MG/ML SOLN COMPARISON:  06/16/2018 chest CT angiogram.  12/25/2018 CT abdomen. FINDINGS: Cardiovascular: Borderline mild cardiomegaly. Small pericardial effusion. Three-vessel coronary atherosclerosis. Atherosclerotic nonaneurysmal thoracic aorta. Normal caliber pulmonary arteries. No central pulmonary emboli. Mediastinum/Nodes: Peripherally calcified centrally hypodense 0.9 cm right thyroid  nodule. Unremarkable esophagus. No pathologically enlarged axillary, mediastinal or hilar lymph nodes. Lungs/Pleura: No pneumothorax. Trace posterior bilateral pleural effusions. Moderate centrilobular emphysema with mild diffuse bronchial wall thickening. Solid slightly spiculated 1.1 cm posteromedial right lower lobe pulmonary nodule (series 4/image 94),  with this lung region previously obscured by atelectasis on 06/16/2018 chest CT. No acute consolidative airspace disease, lung masses or additional significant pulmonary nodules. 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 Trace upper abdominal ascites. Musculoskeletal: No aggressive appearing focal osseous lesions. Marked thoracic spondylosis.  IMPRESSION: 1. Solid slightly spiculated 1.1 cm posteromedial right lower lobe pulmonary nodule, suspicious for pulmonary metastasis. 2. No thoracic adenopathy. 3. Trace posterior bilateral pleural effusions. 4. Borderline mild cardiomegaly. Small pericardial effusion. Three-vessel coronary atherosclerosis. 5. 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 Trace upper abdominal ascites. Please see the recent MRI abdomen report for further details. 6. Aortic Atherosclerosis (ICD10-I70.0) and Emphysema (ICD10-J43.9). Electronically Signed   By: Levell Reach M.D.   On: 01/05/2023 19:44   MR ABDOMEN MRCP W WO CONTAST Result Date: 01/04/2023 CLINICAL DATA:  Biliary obstruction. Liver metastasis suspected on ultrasound. EXAM: MRI ABDOMEN WITHOUT AND WITH CONTRAST (INCLUDING MRCP) TECHNIQUE: Multiplanar multisequence MR imaging of the abdomen was performed both before and after the administration of intravenous contrast. Heavily T2-weighted images of the biliary and pancreatic ducts were obtained, and three-dimensional MRCP images were rendered by post processing. CONTRAST:  7mL GADAVIST  GADOBUTROL  1 MMOL/ML IV SOLN COMPARISON:  Ultrasound 12/30/2022 FINDINGS: Lower chest:  Lung bases are clear. Hepatobiliary: Multiple round enhancing lesions within LEFT and RIGHT hepatic lobe. Lesions reaches confluence in the lateral  segment of LEFT hepatic lobe which is nearly complete replaced by enhancing lesions. These lesions demonstrate rapid early arterial enhancement with washout and peripheral pseudo capsules. (Series 1501) The confluent malignant lesions in the LEFT lateral hepatic lobe extend and expand the LEFT portal vein to 18 mm (image 43/series 1504). Findings consistent with tumor thrombus. This thrombus extends to the bifurcation of the main portal vein and partially involves the proximal RIGHT portal vein (image 48). There is biliary duct dilatation in the segment 4A of the liver. Gallbladder is distended to 58 mm. Several dependent gallstones are present. There several filling defects within distal common bile duct noted on coronal image 18/series 7. Filling defects stack at the distal common bile duct. The common bile duct is dilated to 11 mm. Several enlarged lymph nodes in the porta hepatis including 19 mm node on image 57/1502. Lymph node adjacent the head of the pancreas measures 11 mm image 73. Pancreas: Normal pancreatic parenchymal intensity. No ductal dilatation or inflammation. Spleen: Normal spleen. Adrenals/urinary tract: Adrenal glands and kidneys are normal. Stomach/Bowel: Stomach and limited of the small bowel is unremarkable Vascular/Lymphatic: Abdominal aortic normal caliber. No retroperitoneal periportal lymphadenopathy. Musculoskeletal: No aggressive osseous lesion IMPRESSION: 1. Multifocal bilobar hepatic malignant tumors. Lesions reach confluence within the LEFT lateral hepatic lobe. 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 Enhancement pattern and portal vein tumor thrombosis are typical of hepatocellular carcinoma. 2. Porta hepatis lymphadenopathy. 3. Distal choledocholithiasis mild extrahepatic duct dilatation. Gallbladder distension. These results will be called to the ordering clinician or  representative by the Radiologist Assistant, and communication documented in the PACS or [employer]. Electronically Signed   By: Deboraha Fallow M.D.   On: 01/04/2023 08:59   MR 3D Recon At Scanner Result Date: 01/04/2023 CLINICAL DATA:  Biliary obstruction. Liver metastasis suspected on ultrasound. EXAM: MRI ABDOMEN WITHOUT AND WITH CONTRAST (INCLUDING MRCP) TECHNIQUE: Multiplanar multisequence MR imaging of the abdomen was performed both before and after the administration of intravenous contrast. Heavily T2-weighted images of the biliary and pancreatic ducts were obtained, and three-dimensional MRCP images were rendered by post processing. CONTRAST:  7mL GADAVIST  GADOBUTROL  1 MMOL/ML IV SOLN COMPARISON:  Ultrasound 12/30/2022 FINDINGS: Lower chest:  Lung bases are clear.  Hepatobiliary: Multiple round enhancing lesions within LEFT and RIGHT hepatic lobe. Lesions reaches confluence in the lateral segment of LEFT hepatic lobe which is nearly complete replaced by enhancing lesions. These lesions demonstrate rapid early arterial enhancement with washout and peripheral pseudo capsules. (Series 1501) The confluent malignant lesions in the LEFT lateral hepatic lobe extend and expand the LEFT portal vein to 18 mm (image 43/series 1504). Findings consistent with tumor thrombus. This thrombus extends to the bifurcation of the main portal vein and partially involves the proximal RIGHT portal vein (image 48). There is biliary duct dilatation in the segment 4A of the liver. Gallbladder is distended to 58 mm. Several dependent gallstones are present. There several filling defects within distal common bile duct noted on coronal image 18/series 7. Filling defects stack at the distal common bile duct. The common bile duct is dilated to 11 mm. Several enlarged lymph nodes in the porta hepatis including 19 mm node on image 57/1502. Lymph node adjacent the head of the pancreas measures 11 mm image 73. Pancreas: Normal  pancreatic parenchymal intensity. No ductal dilatation or inflammation. Spleen: Normal spleen. Adrenals/urinary tract: Adrenal glands and kidneys are normal. Stomach/Bowel: Stomach and limited of the small bowel is unremarkable Vascular/Lymphatic: Abdominal aortic normal caliber. No retroperitoneal periportal lymphadenopathy. Musculoskeletal: No aggressive osseous lesion IMPRESSION: 1. Multifocal bilobar hepatic malignant tumors. Lesions reach confluence within the LEFT lateral hepatic lobe. 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 Enhancement pattern and portal vein tumor thrombosis are typical of hepatocellular carcinoma. 2. Porta hepatis lymphadenopathy. 3. Distal choledocholithiasis mild extrahepatic duct dilatation. Gallbladder distension. These results will be called to the ordering clinician or representative by the Radiologist Assistant, and communication documented in the PACS or [employer]. Electronically Signed   By: Deboraha Fallow M.D.   On: 01/04/2023 08:59   US  Abdomen Limited RUQ (LIVER/GB) Result Date: 01/03/2023 CLINICAL DATA:  151471 RUQ pain 151471 EXAM: ULTRASOUND ABDOMEN LIMITED RIGHT UPPER QUADRANT COMPARISON:  RIGHT upper quadrant ultrasound, 12/30/2022. CT AP, 12/25/2018. FINDINGS: Gallbladder: 1.3 cm dependent echogenic gallstone within a mildly distended gallbladder. Gallbladder wall measures greater than the upper level normal thickness, at 0.4 cm. Trace pericholecystic fluid. A POSITIVE sonographic Abigail Abler sign was reported by sonographer. Common bile duct: Diameter: Distended, measuring up to 0.9 cm. Liver: Increased hepatic echogenicity, with a similar appearance of multifocal heterogeneously-echogenic hepatic masses, greatest within the LEFT hepatic lobe and measuring up to 5 cm. See key image. 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 The main  portal vein appears patent on color Doppler imaging with normal direction of blood flow towards the liver. Other: No perihepatic free fluid. IMPRESSION: 1. Cholelithiasis with sonographic findings highly suspicious for acute calculus cholecystitis. 2. Common bile duct distention, suspicious for choledocholithiasis. Correlate with MRCP and obstructive liver enzymes. 3. Multifocal hepatic masses, largest within the LEFT lobe measuring up to 5 cm. These are incompletely characterized on this evaluation. Recommend dedicated, contrasted MR abdomen for further evaluation. These results will be called to the ordering clinician or representative by the Radiologist Assistant, and communication documented in the PACS or [employer]. Electronically Signed   By: Art Largo M.D.   On: 01/03/2023 12:31   US  Abdomen Limited RUQ (LIVER/GB) Result Date: 12/30/2022 CLINICAL DATA:  Right upper quadrant pain and nausea for 3 weeks. EXAM: ULTRASOUND ABDOMEN LIMITED RIGHT UPPER QUADRANT COMPARISON:  CT on 12/25/2018 FINDINGS: Gallbladder:  Gallbladder is poorly distended which limits evaluation. Probable 8 mm gallstone noted. Diffuse gallbladder wall thickening is seen which may be due to underdistention, although other possibilities include cholecystitis, hepatocellular disease, and pancreatitis. No sonographic Murphy sign noted by sonographer. Common bile duct: Diameter: 11 mm, which is mildly dilated. Liver: Multiple hepatic masses are seen which show variable echogenicity. Largest in the left lobe measures 4.8 cm. These are highly suspicious for liver metastases. Portal vein is patent on color Doppler imaging with normal direction of blood flow towards the liver. Other: None. IMPRESSION: Multiple hepatic masses, highly suspicious for liver metastases. Recommend abdomen MRI without and with contrast for further characterization. Nondistended gallbladder with probable cholelithiasis. Diffuse gallbladder wall thickening is  nonspecific, and may be due to underdistention, although other considerations include cholecystitis, hepatocellular disease, and pancreatitis. Mildly dilated common bile duct, measuring 11 mm. This could also be further evaluated by MRI/MRCP. Electronically Signed   By: Marlyce Sine M.D.   On: 12/30/2022 11:29    Labs:  CBC: Recent Labs    01/04/23 0358 01/05/23 0808 01/06/23 0235 01/13/23 1158  WBC 10.0 8.7 7.7 13.6*  HGB 9.9* 10.0* 9.5* 11.8*  HCT 32.0* 32.1* 30.2* 37.3  PLT 266 297 286 547*    COAGS: Recent Labs    01/06/23 0235 01/13/23 1158  INR 1.6* 1.8*    BMP: Recent Labs    01/05/23 0808 01/05/23 1832 01/06/23 0235 01/13/23 1158  NA 137 136 138 142  K 3.6 3.8 3.6 4.1  CL 103 103 103 106  CO2 22 24 24 28   GLUCOSE 139* 205* 103* 119*  BUN 22 23 22 16   CALCIUM  8.4* 8.3* 8.4* 9.1  CREATININE 0.79 0.94 0.82 0.92  GFRNONAA >60 >60 >60 >60    LIVER FUNCTION TESTS: Recent Labs    01/03/23 0830 01/04/23 0358 01/06/23 0235 01/13/23 1158  BILITOT 1.4* 1.3* 0.6 0.5  AST 69* 45* 40 43*  ALT 13 10 14 12   ALKPHOS 109 92 102 163*  PROT 6.1* 5.6* 5.4* 6.7  ALBUMIN 2.6* 2.1* 1.9* 3.6    TUMOR MARKERS: No results for input(s): "AFPTM", "CEA", "CA199", "CHROMGRNA" in the last 8760 hours.  Assessment and Plan: Heather Oconnor is a 83 y.o. female with history of diabetes mellitus, CVA, HLD, GERD, hemorrhoids, IBS, pancreatitis, HTN, and cirrhosis. Patient is currently followed by Arlo Berber, MD with medical oncology. Patient's initial consult was 01/13/2023 for newly diagnosed hepatocellular carcinoma. MRI 01/03/23 reported multifocal bilobar hepatic malignant tumors. Patient was referred today for a port placement to assist with chemotherapy.   Patient reports being diagnosed with liver cancer. She reports feeling nervous today, due to never having a procedure or surgery in the past. Overall, patient feels well. She has not had anything to eat or drink since  6 pm last night. Patient's last dose of eliquis  was early yesterday 01/18/23 morning.   Risks and benefits of image guided port-a-catheter placement was discussed with the patient including, but not limited to bleeding, infection, pneumothorax, or fibrin sheath development and need for additional procedures.  All of the patient's questions were answered, patient is agreeable to proceed. Consent signed and in chart.  Thank you for this interesting consult.  I greatly enjoyed meeting Heather Oconnor and look forward to participating in their care.  A copy of this report was sent to the requesting provider on this date.  Electronically Signed: Lawrance Presume, PA 01/19/2023, 8:31 AM   I spent a total of  15 Minutes   in face to face in clinical consultation, greater than 50% of which was counseling/coordinating care for hepatocellular carcinoma.

## 2023-01-18 ENCOUNTER — Other Ambulatory Visit (HOSPITAL_COMMUNITY): Payer: Self-pay | Admitting: Radiology

## 2023-01-19 ENCOUNTER — Ambulatory Visit (HOSPITAL_COMMUNITY)
Admission: RE | Admit: 2023-01-19 | Discharge: 2023-01-19 | Disposition: A | Discharge status: Home / Self Care | Payer: Medicare HMO | Source: Ambulatory Visit | Attending: Oncology | Admitting: Oncology

## 2023-01-19 ENCOUNTER — Encounter: Payer: Self-pay | Admitting: Oncology

## 2023-01-19 ENCOUNTER — Other Ambulatory Visit: Payer: Self-pay | Admitting: Oncology

## 2023-01-19 ENCOUNTER — Other Ambulatory Visit: Payer: Self-pay

## 2023-01-19 ENCOUNTER — Encounter (HOSPITAL_COMMUNITY): Payer: Self-pay

## 2023-01-19 DIAGNOSIS — Z87891 Personal history of nicotine dependence: Secondary | ICD-10-CM | POA: Insufficient documentation

## 2023-01-19 DIAGNOSIS — I10 Essential (primary) hypertension: Secondary | ICD-10-CM | POA: Diagnosis not present

## 2023-01-19 DIAGNOSIS — K589 Irritable bowel syndrome without diarrhea: Secondary | ICD-10-CM | POA: Insufficient documentation

## 2023-01-19 DIAGNOSIS — C220 Liver cell carcinoma: Secondary | ICD-10-CM

## 2023-01-19 DIAGNOSIS — K7460 Unspecified cirrhosis of liver: Secondary | ICD-10-CM | POA: Insufficient documentation

## 2023-01-19 DIAGNOSIS — E785 Hyperlipidemia, unspecified: Secondary | ICD-10-CM | POA: Diagnosis not present

## 2023-01-19 DIAGNOSIS — Z7984 Long term (current) use of oral hypoglycemic drugs: Secondary | ICD-10-CM | POA: Diagnosis not present

## 2023-01-19 DIAGNOSIS — K219 Gastro-esophageal reflux disease without esophagitis: Secondary | ICD-10-CM | POA: Insufficient documentation

## 2023-01-19 DIAGNOSIS — E119 Type 2 diabetes mellitus without complications: Secondary | ICD-10-CM | POA: Diagnosis not present

## 2023-01-19 HISTORY — PX: IR IMAGING GUIDED PORT INSERTION: IMG5740

## 2023-01-19 LAB — GLUCOSE, CAPILLARY: Glucose-Capillary: 113 mg/dL — ABNORMAL HIGH (ref 70–99)

## 2023-01-19 MED ORDER — HEPARIN SOD (PORK) LOCK FLUSH 100 UNIT/ML IV SOLN
INTRAVENOUS | Status: AC
  Filled 2023-01-19: qty 5

## 2023-01-19 MED ORDER — FENTANYL CITRATE (PF) 100 MCG/2ML IJ SOLN
INTRAMUSCULAR | Status: AC
  Filled 2023-01-19: qty 2

## 2023-01-19 MED ORDER — MIDAZOLAM HCL 2 MG/2ML IJ SOLN
INTRAMUSCULAR | Status: AC | PRN
  Administered 2023-01-19: .5 mg via INTRAVENOUS

## 2023-01-19 MED ORDER — MIDAZOLAM HCL 2 MG/2ML IJ SOLN
INTRAMUSCULAR | Status: AC
  Filled 2023-01-19: qty 2

## 2023-01-19 MED ORDER — HEPARIN SOD (PORK) LOCK FLUSH 100 UNIT/ML IV SOLN
500.0000 [IU] | Freq: Once | INTRAVENOUS | Status: AC
  Administered 2023-01-19: 500 [IU] via INTRAVENOUS

## 2023-01-19 MED ORDER — LIDOCAINE-EPINEPHRINE 1 %-1:100000 IJ SOLN
20.0000 mL | Freq: Once | INTRAMUSCULAR | Status: AC
  Administered 2023-01-19: 10 mL via INTRADERMAL

## 2023-01-19 MED ORDER — DIPHENHYDRAMINE HCL 50 MG/ML IJ SOLN
INTRAMUSCULAR | Status: AC | PRN
  Administered 2023-01-19: 25 mg via INTRAVENOUS

## 2023-01-19 MED ORDER — SODIUM CHLORIDE 0.9 % IV SOLN: INTRAVENOUS | Status: DC

## 2023-01-19 MED ORDER — MIDAZOLAM HCL 5 MG/5ML IJ SOLN
INTRAMUSCULAR | Status: AC | PRN
  Administered 2023-01-19: .5 mg via INTRAVENOUS

## 2023-01-19 MED ORDER — FENTANYL CITRATE (PF) 100 MCG/2ML IJ SOLN
INTRAMUSCULAR | Status: AC | PRN
  Administered 2023-01-19: 25 ug via INTRAVENOUS

## 2023-01-19 MED ORDER — DIPHENHYDRAMINE HCL 50 MG/ML IJ SOLN
INTRAMUSCULAR | Status: AC
  Filled 2023-01-19: qty 1

## 2023-01-19 MED ORDER — LIDOCAINE-EPINEPHRINE 1 %-1:100000 IJ SOLN
INTRAMUSCULAR | Status: AC
  Filled 2023-01-19: qty 1

## 2023-01-19 NOTE — Progress Notes (Signed)
 0850 Ice bag given to use at home for comfort to right neck and right chest as needed.

## 2023-01-19 NOTE — Procedures (Signed)
 Pre Procedure Dx: [facility] Post Procedural Dx: Same  Successful placement of right IJ approach port-a-cath with tip at the superior caval atrial junction. The catheter is ready for immediate use.  Estimated Blood Loss: Trace  Complications: None immediate.  Josem Nick, MD Pager #: [phone]

## 2023-01-19 NOTE — Progress Notes (Signed)
 Discussed patient's case with Dr. Darylene Epley, IR.  She would be a good candidate for TACE therapy for liver lesions in an attempt to decrease tumor burden.  Formal referral sent.

## 2023-01-19 NOTE — Discharge Instructions (Signed)
 Discharge Instructions:   Please call Interventional Radiology clinic [phone] with any questions or concerns.  You may remove your bandaid to right upper neck and dressing right upper chest and shower tomorrow.  Do not use EMLA / Lidocaine cream for 2 weeks post Port Insertion this will remove the surgical glue.    Moderate Conscious Sedation, Adult, Care After  This sheet gives you information about how to care for yourself after your procedure. Your health care provider may also give you more specific instructions. If you have problems or questions, contact your health care provider. What can I expect after the procedure? After the procedure, it is common to have: Sleepiness for several hours. Impaired judgment for several hours. Difficulty with balance. Vomiting if you eat too soon. Follow these instructions at home: For the time period you were told by your health care provider: Rest. Do not participate in activities where you could fall or become injured. Do not drive or use machinery. Do not drink alcohol. Do not take sleeping pills or medicines that cause drowsiness. Do not make important decisions or sign legal documents. Do not take care of children on your own. Eating and drinking  Follow the diet recommended by your health care provider. Drink enough fluid to keep your urine pale yellow. If you vomit: Drink water, juice, or soup when you can drink without vomiting. Make sure you have little or no nausea before eating solid foods. General instructions Take over-the-counter and prescription medicines only as told by your health care provider. Have a responsible adult stay with you for the time you are told. It is important to have someone help care for you until you are awake and alert. Do not smoke. Keep all follow-up visits as told by your health care provider. This is important. Contact a health care provider if: You are still sleepy or having trouble with  balance after 24 hours. You feel light-headed. You keep feeling nauseous or you keep vomiting. You develop a rash. You have a fever. You have redness or swelling around the IV site. Get help right away if: You have trouble breathing. You have new-onset confusion at home. Summary After the procedure, it is common to feel sleepy, have impaired judgment, or feel nauseous if you eat too soon. Rest after you get home. Know the things you should not do after the procedure. Follow the diet recommended by your health care provider and drink enough fluid to keep your urine pale yellow. Get help right away if you have trouble breathing or new-onset confusion at home. This information is not intended to replace advice given to you by your health care provider. Make sure you discuss any questions you have with your health care provider. Document Revised: 04/20/2019 Document Reviewed: 11/16/2018 Elsevier Patient Education  2023 [employer]  Implanted [place] Insertion, Care After The following information offers guidance on how to care for yourself after your procedure. Your health care provider may also give you more specific instructions. If you have problems or questions, contact your health care provider. What can I expect after the procedure? After the procedure, it is common to have: Discomfort at the port insertion site. Bruising on the skin over the port. This should improve over 3-4 days. Follow these instructions at home: [facility] care After your port is placed, you will get a manufacturer's information card. The card has information about your port. Keep this card with you at all times. Take care of the port as told by  your health care provider. Ask your health care provider if you or a family member can get training for taking care of the port at home. A home health care nurse will be be available to help care for the port. Make sure to remember what type of port you have. Incision care      Follow instructions from your health care provider about how to take care of your port insertion site. Make sure you: Wash your hands with soap and water for at least 20 seconds before and after you change your bandage (dressing). If soap and water are not available, use hand sanitizer. Change your dressing as told by your health care provider. Leave stitches (sutures), skin glue, or adhesive strips in place. These skin closures may need to stay in place for 2 weeks or longer. If adhesive strip edges start to loosen and curl up, you may trim the loose edges. Do not remove adhesive strips completely unless your health care provider tells you to do that. Check your port insertion site every day for signs of infection. Check for: Redness, swelling, or pain. Fluid or blood. Warmth. Pus or a bad smell. Activity Return to your normal activities as told by your health care provider. Ask your health care provider what activities are safe for you. You may have to avoid lifting. Ask your health care provider how much you can safely lift. General instructions Take over-the-counter and prescription medicines only as told by your health care provider. Do not take baths, swim, or use a hot tub until your health care provider approves. Ask your health care provider if you may take showers. You may only be allowed to take sponge baths. If you were given a sedative during the procedure, it can affect you for several hours. Do not drive or operate machinery until your health care provider says that it is safe. Wear a medical alert bracelet in case of an emergency. This will tell any health care providers that you have a port. Keep all follow-up visits. This is important. Contact a health care provider if: You cannot flush your port with saline as directed, or you cannot draw blood from the port. You have a fever or chills. You have redness, swelling, or pain around your port insertion site. You have fluid or  blood coming from your port insertion site. Your port insertion site feels warm to the touch. You have pus or a bad smell coming from the port insertion site. Get help right away if: You have chest pain or shortness of breath. You have bleeding from your port that you cannot control. These symptoms may be an emergency. Get help right away. Call 911. Do not wait to see if the symptoms will go away. Do not drive yourself to the hospital. Summary Take care of the port as told by your health care provider. Keep the manufacturer's information card with you at all times. Change your dressing as told by your health care provider. Contact a health care provider if you have a fever or chills or if you have redness, swelling, or pain around your port insertion site. Keep all follow-up visits. This information is not intended to replace advice given to you by your health care provider. Make sure you discuss any questions you have with your health care provider. Document Revised: 06/24/2020 Document Reviewed: 06/24/2020 Elsevier Patient Education  2023 [employer].

## 2023-01-20 ENCOUNTER — Telehealth: Payer: Self-pay | Admitting: Oncology

## 2023-01-20 ENCOUNTER — Other Ambulatory Visit: Payer: Self-pay | Admitting: Oncology

## 2023-01-20 ENCOUNTER — Inpatient Hospital Stay: Payer: Medicare HMO | Admitting: Family Medicine

## 2023-01-20 ENCOUNTER — Telehealth: Payer: Self-pay | Admitting: Dietician

## 2023-01-20 DIAGNOSIS — C220 Liver cell carcinoma: Secondary | ICD-10-CM

## 2023-01-20 NOTE — Telephone Encounter (Signed)
Patient screened on MST. First attempt to reach. Provided my cell# on voice mail to return call to set up a nutrition consult. ° °Cyndi Shaquita Fort, RDN, LDN °Registered Dietitian, [facility] °Part Time Remote (Usual office hours: Tuesday-Thursday) °Cell: [phone]   °

## 2023-01-20 NOTE — Telephone Encounter (Signed)
 Heather Oconnor

## 2023-01-21 ENCOUNTER — Other Ambulatory Visit: Payer: Self-pay | Admitting: *Deleted

## 2023-01-21 ENCOUNTER — Encounter: Payer: Self-pay | Admitting: Oncology

## 2023-01-21 ENCOUNTER — Inpatient Hospital Stay: Payer: Medicare HMO | Admitting: Oncology

## 2023-01-21 ENCOUNTER — Inpatient Hospital Stay: Payer: Medicare HMO

## 2023-01-21 ENCOUNTER — Inpatient Hospital Stay: Payer: Medicare HMO | Attending: Oncology

## 2023-01-21 ENCOUNTER — Encounter: Payer: Self-pay | Admitting: *Deleted

## 2023-01-21 VITALS — BP 111/56 | HR 63 | Temp 98.1°F | Resp 17 | Wt 130.7 lb

## 2023-01-21 DIAGNOSIS — Z79899 Other long term (current) drug therapy: Secondary | ICD-10-CM | POA: Diagnosis not present

## 2023-01-21 DIAGNOSIS — Z5112 Encounter for antineoplastic immunotherapy: Secondary | ICD-10-CM | POA: Diagnosis not present

## 2023-01-21 DIAGNOSIS — R630 Anorexia: Secondary | ICD-10-CM | POA: Diagnosis not present

## 2023-01-21 DIAGNOSIS — Z95828 Presence of other vascular implants and grafts: Secondary | ICD-10-CM | POA: Insufficient documentation

## 2023-01-21 DIAGNOSIS — C220 Liver cell carcinoma: Secondary | ICD-10-CM

## 2023-01-21 LAB — CMP (CANCER CENTER ONLY)
ALT: 12 U/L (ref 0–44)
AST: 69 U/L — ABNORMAL HIGH (ref 15–41)
Albumin: 3 g/dL — ABNORMAL LOW (ref 3.5–5.0)
Alkaline Phosphatase: 156 U/L — ABNORMAL HIGH (ref 38–126)
Anion gap: 8 (ref 5–15)
BUN: 30 mg/dL — ABNORMAL HIGH (ref 8–23)
CO2: 25 mmol/L (ref 22–32)
Calcium: 8.5 mg/dL — ABNORMAL LOW (ref 8.9–10.3)
Chloride: 107 mmol/L (ref 98–111)
Creatinine: 1.12 mg/dL — ABNORMAL HIGH (ref 0.44–1.00)
GFR, Estimated: 49 mL/min — ABNORMAL LOW (ref >60)
Glucose, Bld: 113 mg/dL — ABNORMAL HIGH (ref 70–99)
Potassium: 4.2 mmol/L (ref 3.5–5.1)
Sodium: 140 mmol/L (ref 135–145)
Total Bilirubin: 0.6 mg/dL (ref 0.0–1.2)
Total Protein: 5.8 g/dL — ABNORMAL LOW (ref 6.5–8.1)

## 2023-01-21 LAB — CBC WITH DIFFERENTIAL (CANCER CENTER ONLY)
Abs Immature Granulocytes: 0.05 10*3/uL (ref 0.00–0.07)
Basophils Absolute: 0 10*3/uL (ref 0.0–0.1)
Basophils Relative: 0 %
Eosinophils Absolute: 0.1 10*3/uL (ref 0.0–0.5)
Eosinophils Relative: 1 %
HCT: 33.7 % — ABNORMAL LOW (ref 36.0–46.0)
Hemoglobin: 10.4 g/dL — ABNORMAL LOW (ref 12.0–15.0)
Immature Granulocytes: 0 %
Lymphocytes Relative: 8 %
Lymphs Abs: 1.2 10*3/uL (ref 0.7–4.0)
MCH: 27.5 pg (ref 26.0–34.0)
MCHC: 30.9 g/dL (ref 30.0–36.0)
MCV: 89.2 fL (ref 80.0–100.0)
Monocytes Absolute: 1 10*3/uL (ref 0.1–1.0)
Monocytes Relative: 7 %
Neutro Abs: 12.2 10*3/uL — ABNORMAL HIGH (ref 1.7–7.7)
Neutrophils Relative %: 84 %
Platelet Count: 305 10*3/uL (ref 150–400)
RBC: 3.78 MIL/uL — ABNORMAL LOW (ref 3.87–5.11)
RDW: 17.7 % — ABNORMAL HIGH (ref 11.5–15.5)
WBC Count: 14.5 10*3/uL — ABNORMAL HIGH (ref 4.0–10.5)
nRBC: 0 % (ref 0.0–0.2)

## 2023-01-21 LAB — TSH: TSH: 2.44 u[IU]/mL (ref 0.350–4.500)

## 2023-01-21 MED ORDER — SODIUM CHLORIDE 0.9 % IV SOLN: INTRAVENOUS | Status: DC

## 2023-01-21 MED ORDER — SODIUM CHLORIDE 0.9% FLUSH
10.0000 mL | Freq: Once | INTRAVENOUS | Status: AC
  Administered 2023-01-21: 10 mL

## 2023-01-21 MED ORDER — HEPARIN SOD (PORK) LOCK FLUSH 100 UNIT/ML IV SOLN
500.0000 [IU] | Freq: Once | INTRAVENOUS | Status: AC | PRN
  Administered 2023-01-21: 500 [IU]

## 2023-01-21 MED ORDER — SODIUM CHLORIDE 0.9% FLUSH
10.0000 mL | INTRAVENOUS | Status: DC | PRN
Start: 2023-01-21 — End: 2023-01-21
  Administered 2023-01-21: 10 mL

## 2023-01-21 MED ORDER — SODIUM CHLORIDE 0.9 % IV SOLN
1500.0000 mg | Freq: Once | INTRAVENOUS | Status: AC
  Administered 2023-01-21: 1500 mg via INTRAVENOUS
  Filled 2023-01-21: qty 30

## 2023-01-21 NOTE — Progress Notes (Signed)
[facility]  ONCOLOGY CLINIC PROGRESS NOTE   Patient Care Team: Gabriel Earing, FNP as PCP - General (Family Medicine) Rollene Rotunda, MD as PCP - Cardiology (Cardiology) Jena Gauss Gerrit Friends, MD as Consulting Physician (Gastroenterology) Bond, Doran Stabler, MD as Consulting Physician (Ophthalmology) Smitty Cords, OD (Optometry) Audrie Gallus, RN as [facility] Care Management Pickenpack-Cousar, Arty Baumgartner, NP as Nurse Practitioner [facility] and Palliative Medicine)  PATIENT NAME: Heather Oconnor   MR#: [id number] DOB: Dec 22, 1940  Date of visit: 01/21/2023   ASSESSMENT & PLAN:   Heather Oconnor is a 83 y.o. lady with a past medical history of CVA on Plavix, IBS, history of pancreatic pseudocysts, HLD, HTN, HFpEF, COPD, CKD stage 3b, ? H/o cirrhosis with normal follow up imaging per GI note in 09/2022, depression, was referred to our clinic in January 2025 for newly diagnosed hepatocellular carcinoma. 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  Hepatocellular carcinoma (HCC) Confirmed diagnosis based on MRI findings and elevated ASP marker (107). 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   Discussed her case in GI conference on 01/12/2023.  Depending on performance status, she will be considered for liver directed therapy versus systemic treatments.  -Discussed diagnosis, prognosis, staging, treatment options.  Reviewed NCCN guidelines.    -Her performance status was fairly good until recent hospitalization.  Even now she is trying to be independent as much as she can.   -Discussed several systemic treatment options.   Plan to proceed with single agent durvalumab treatments.  We will avoid tremelimumab because of her visual disturbances.  Also avoiding Avastin since we cannot get EGD to rule out varices.  Also referral sent to IR for TACE of most dominant lesions, in an attempt to decrease tumor burden.  If  scheduled, she will need bridging therapy with Lovenox while holding Eliquis periprocedurally.  Explained immunotherapy mechanism, potential for survival extension, and quality of life improvement. Risks include auto-immune side effects. Patient and family opted for immunotherapy alone due to eye issues and potential side effects of combination therapy.  -She had Port-A-Cath placed on 01/19/2023.  -Labs reviewed.  No dose-limiting toxicities.  Will proceed with cycle 1 of durvalumab at 1500 mg IV dose today.  Plan to continue this every 4 weeks.  RTC in 4 weeks for labs, follow-up and continuation of durvalumab treatment.  Decreased appetite Decreased appetite and difficulty eating dry foods. Discussed oral Megace to improve appetite; patient prefers to wait for potential improvement with immunotherapy. - Reassess appetite and consider Megace on RTC, if no improvement.    I reviewed lab results and outside records for this visit and discussed relevant results with the patient. Diagnosis, plan of care and treatment options were also discussed in detail with the patient. Opportunity provided to ask questions and answers provided to her apparent satisfaction. Provided instructions to call our clinic with any problems, questions or concerns prior to return visit. I recommended to continue follow-up with PCP and sub-specialists. She verbalized understanding and agreed with the plan.   NCCN guidelines have been consulted in the planning of this patient's care.  I spent a total of 30 minutes during this encounter with the patient including review of chart and various tests results, discussions about plan of care and coordination of care plan.   [name], MD  01/21/2023 2:05 PM  [facility] - A DEPT OF [facility] [address]  [place] [address] Dept: [phone] Dept Fax: [phone]    CHIEF COMPLAINT/ REASON FOR  VISIT:   Multifocal hepatocellular carcinoma.  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  Current Treatment:  Started durvalumab from 01/21/2023.  Plan to continue every 4 weeks.  Also referral sent to IR for TACE of most dominant lesions, in an attempt to decrease tumor burden.  INTERVAL HISTORY:    Discussed the use of AI scribe software for clinical note transcription with the patient, who gave verbal consent to proceed.   Heather Oconnor is here today for repeat clinical assessment.   She had port placed on 01/19/2023.  She reports some soreness from the procedure.   The patient also reports a persistent dry mouth, which has affected her ability to eat. She describes a gritty sensation and difficulty swallowing dry foods. She has tried hard candy and mints, as well as sipping water, but these measures have not alleviated the symptoms. The patient is hopeful that these symptoms will improve once the immunotherapy starts.  In addition, the patient has been on Eliquis for at least two weeks for blood clots in the liver. The patient also reports regular bowel movements and denies any blood in stools, black stools, nausea, or vomiting.  I have reviewed the past medical history, past surgical history, social history and family history with the patient and they are unchanged from previous note.  HISTORY OF PRESENT ILLNESS:   ONCOLOGY HISTORY:   She presented to the hospital on 01/03/2023 with 3-week history of diffuse abdominal pain, poor oral intake, nausea.  On exam she had diffuse tenderness to palpation without guarding or rebound.  Initial ultrasound in the ED showed cholelithiasis with possible cholecystitis.  She was also noted to have hyperbilirubinemia and transaminitis with dilated CBD on ultrasound.  Liver lesions, left portal vein thrombosis were also noted on ultrasound.  She was hospitalized for further evaluation and management.   General surgery, GI  were consulted.  There was no role for surgery.  MRCP on 01/04/2023 confirmed portal vein thrombosis as well as multifocal hepatic masses, bilobar.  Tumor invaded the portal vein with large tumor thrombus within the left portal vein and early tumor thrombus involvement of the right portal vein.  Enhancement pattern and portal vein tumor thrombosis are typical of hepatocellular carcinoma.  There was also porta hepatis lymphadenopathy.   On 01/05/2023, CT chest showed solid, slightly spiculated 1.1 cm posteromedial right lower lobe pulmonary nodule, suspicious for pulmonary metastatic disease.  No thoracic adenopathy.   AFP was elevated at 107 ng/mL on 01/05/2023.   Oncology service was consulted when patient was in the hospital.  Dr. Al Pimple evaluated the patient on 01/05/2023.  Since patient was not a candidate for surgery or transplant, liver directed therapies or systemic treatments were discussed.  Plan made to discuss her case in GI multidisciplinary conference.   Discussed her case in GI conference on 01/12/2023.  Depending on performance status, she will be considered for liver directed therapy versus systemic treatments.   Plan to proceed with single agent durvalumab treatments.  We will avoid tremelimumab because of her visual disturbances.  Also avoiding Avastin since we cannot get EGD to rule out varices.  Also referral sent to IR for TACE of most dominant lesions, in an attempt to decrease tumor burden.   Started durvalumab from 01/21/2023.  Plan to continue every 4 weeks.  Oncology History  Hepatocellular carcinoma (HCC)  01/06/2023 Initial Diagnosis   Hepatocellular carcinoma (HCC)  01/13/2023 Cancer Staging   Staging form: Liver, AJCC 8th Edition - Clinical: Stage IVB (cT3, cN1, cM1) - Signed by [name], MD on 01/13/2023   01/21/2023 -  Chemotherapy   Patient is on Treatment Plan : Durvalumab (1500) q28d         REVIEW OF SYSTEMS:   Review of Systems - Oncology  All other  pertinent systems were reviewed with the patient and are negative.  ALLERGIES: She is allergic to hctz [hydrochlorothiazide] and lisinopril.  MEDICATIONS:  Current Outpatient Medications  Medication Sig Dispense Refill   amLODipine (NORVASC) 10 MG tablet TAKE 1 TABLET EVERY DAY 90 tablet 1   apixaban (ELIQUIS) 5 MG TABS tablet Take 1 tablet (5 mg total) by mouth 2 (two) times daily. 60 tablet 5   atorvastatin (LIPITOR) 40 MG tablet TAKE 1 TABLET EVERY DAY 30 tablet 0   bisoprolol (ZEBETA) 5 MG tablet TAKE 1 AND 1/2 TABLETS EVERY DAY 135 tablet 0   cloNIDine (CATAPRES) 0.2 MG tablet TAKE 1 TABLET TWICE DAILY 180 tablet 1   dorzolamide-timolol (COSOPT) 22.3-6.8 MG/ML ophthalmic solution Place 1 drop into both eyes 2 (two) times daily.     hydrALAZINE (APRESOLINE) 100 MG tablet TAKE 1 TABLET THREE TIMES DAILY 270 tablet 1   lidocaine-prilocaine (EMLA) cream Apply to affected area once 30 g 3   metFORMIN (GLUCOPHAGE) 500 MG tablet TAKE 2 TABLETS TWICE DAILY WITH MEALS (Patient taking differently: Take 1,000 mg by mouth 2 (two) times daily with a meal. Patient has only been taking one tablet twice daily due to illness. 01/03/2023.) 360 tablet 1   mirtazapine (REMERON) 15 MG tablet TAKE 1 TABLET AT BEDTIME 90 tablet 3   ondansetron (ZOFRAN) 4 MG tablet Take 1 tablet (4 mg total) by mouth every 8 (eight) hours as needed for nausea or vomiting. 20 tablet 0   pantoprazole (PROTONIX) 40 MG tablet TAKE 1 TABLET EVERY DAY 90 tablet 1   spironolactone (ALDACTONE) 25 MG tablet Take 1 tablet (25 mg total) by mouth daily. 90 tablet 3   TRUE METRIX BLOOD GLUCOSE TEST test strip 1 each by Other route 4 (four) times daily as needed.     TRUEplus Lancets 33G MISC Test BS up to four times daily as needed Dx E11.9 400 each 3   valsartan (DIOVAN) 320 MG tablet Take 1 tablet (320 mg total) by mouth daily. 90 tablet 1   Multiple Vitamin (MULTIVITAMIN WITH MINERALS) TABS tablet Take 1 tablet by mouth daily. (Patient  not taking: Reported on 01/21/2023) 30 tablet 0   ondansetron (ZOFRAN) 8 MG tablet Take 1 tablet (8 mg total) by mouth every 8 (eight) hours as needed for nausea or vomiting. (Patient not taking: Reported on 01/21/2023) 30 tablet 1   prochlorperazine (COMPAZINE) 10 MG tablet Take 1 tablet (10 mg total) by mouth every 6 (six) hours as needed for nausea or vomiting. (Patient not taking: Reported on 01/21/2023) 30 tablet 1   thiamine (VITAMIN B1) 100 MG tablet Take 1 tablet (100 mg total) by mouth daily. (Patient not taking: Reported on 01/21/2023) 30 tablet 0   vitamin B-12 (CYANOCOBALAMIN) 1000 MCG tablet Take 1,000 mcg by mouth daily. (Patient not taking: Reported on 01/21/2023)     Vitamin D, Ergocalciferol, (DRISDOL) 1.25 MG (50000 UNIT) CAPS capsule Take 1 capsule (50,000 Units total) by mouth every 7 (seven) days. (Patient not taking: Reported on 01/21/2023) 12 capsule 0   No current facility-administered medications for this visit.   Facility-Administered Medications  Ordered in Other Visits  Medication Dose Route Frequency Provider Last Rate Last Admin   0.9 %  sodium chloride infusion   Intravenous Continuous Nicklaus Alviar, MD 10 mL/hr at 01/21/23 1239 New Bag at 01/21/23 1239   durvalumab (IMFINZI) 1,500 mg in sodium chloride 0.9 % 100 mL chemo infusion  1,500 mg Intravenous Once Kippy Gohman, MD 130 mL/hr at 01/21/23 1315 1,500 mg at 01/21/23 1315   heparin lock flush 100 unit/mL  500 Units Intracatheter Once PRN Gregory Dowe, MD       sodium chloride flush (NS) 0.9 % injection 10 mL  10 mL Intracatheter PRN Maxton Noreen, MD         VITALS:   Blood pressure (!) 111/56, pulse 63, temperature 98.1 F (36.7 C), temperature source Temporal, resp. rate 17, weight 130 lb 11.2 oz (59.3 kg), SpO2 95%.  Wt Readings from Last 3 Encounters:  01/21/23 130 lb 11.2 oz (59.3 kg)  01/19/23 131 lb 2.8 oz (59.5 kg)  01/13/23 131 lb 1.6 oz (59.5 kg)    Body mass index is 23.15 kg/m.  Performance  status (ECOG): 2 - Symptomatic, <50% confined to bed  PHYSICAL EXAM:   Physical Exam Constitutional:      General: She is not in acute distress.    Appearance: Normal appearance.  HENT:     Head: Normocephalic and atraumatic.  Eyes:     General: No scleral icterus.    Conjunctiva/sclera: Conjunctivae normal.  Cardiovascular:     Rate and Rhythm: Normal rate and regular rhythm.     Heart sounds: Normal heart sounds.  Pulmonary:     Effort: Pulmonary effort is normal.     Breath sounds: Normal breath sounds.  Abdominal:     General: There is no distension.  Musculoskeletal:     Right lower leg: No edema.     Left lower leg: No edema.  Neurological:     General: No focal deficit present.     Mental Status: She is alert and oriented to person, place, and time.  Psychiatric:        Mood and Affect: Mood normal.        Behavior: Behavior normal.        Thought Content: Thought content normal.       LABORATORY DATA:   I have reviewed the data as listed.  Results for orders placed or performed in visit on 01/21/23  CBC with Differential (Cancer Center Only)  Result Value Ref Range   WBC Count 14.5 (H) 4.0 - 10.5 K/uL   RBC 3.78 (L) 3.87 - 5.11 MIL/uL   Hemoglobin 10.4 (L) 12.0 - 15.0 g/dL   HCT 65.7 (L) 84.6 - 96.2 %   MCV 89.2 80.0 - 100.0 fL   MCH 27.5 26.0 - 34.0 pg   MCHC 30.9 30.0 - 36.0 g/dL   RDW 95.2 (H) 84.1 - 32.4 %   Platelet Count 305 150 - 400 K/uL   nRBC 0.0 0.0 - 0.2 %   Neutrophils Relative % 84 %   Neutro Abs 12.2 (H) 1.7 - 7.7 K/uL   Lymphocytes Relative 8 %   Lymphs Abs 1.2 0.7 - 4.0 K/uL   Monocytes Relative 7 %   Monocytes Absolute 1.0 0.1 - 1.0 K/uL   Eosinophils Relative 1 %   Eosinophils Absolute 0.1 0.0 - 0.5 K/uL   Basophils Relative 0 %   Basophils Absolute 0.0 0.0 - 0.1 K/uL   Immature Granulocytes 0 %  Abs Immature Granulocytes 0.05 0.00 - 0.07 K/uL  CMP (Cancer Center only)  Result Value Ref Range   Sodium 140 135 - 145 mmol/L    Potassium 4.2 3.5 - 5.1 mmol/L   Chloride 107 98 - 111 mmol/L   CO2 25 22 - 32 mmol/L   Glucose, Bld 113 (H) 70 - 99 mg/dL   BUN 30 (H) 8 - 23 mg/dL   Creatinine 7.25 (H) 3.66 - 1.00 mg/dL   Calcium 8.5 (L) 8.9 - 10.3 mg/dL   Total Protein 5.8 (L) 6.5 - 8.1 g/dL   Albumin 3.0 (L) 3.5 - 5.0 g/dL   AST 69 (H) 15 - 41 U/L   ALT 12 0 - 44 U/L   Alkaline Phosphatase 156 (H) 38 - 126 U/L   Total Bilirubin 0.6 0.0 - 1.2 mg/dL   GFR, Estimated 49 (L) >60 mL/min   Anion gap 8 5 - 15    RADIOGRAPHIC STUDIES:  I have personally reviewed the radiological images as listed and agree with the findings in the report.  IR IMAGING GUIDED PORT INSERTION Result Date: 01/19/2023 INDICATION: History of multifocal hepatocellular carcinoma. In need of durable intravenous access for chemotherapy administration. EXAM: IMPLANTED PORT A CATH PLACEMENT WITH ULTRASOUND AND FLUOROSCOPIC GUIDANCE COMPARISON:  Chest CT-01/05/2023 MEDICATIONS: None ANESTHESIA/SEDATION: Moderate (conscious) sedation was employed during this procedure as administered by the Interventional Radiology RN. A total of Benadryl 25 mg, Versed 2 mg and Fentanyl 100 mcg was administered intravenously. Moderate Sedation Time: 28 minutes. The patient's level of consciousness and vital signs were monitored continuously by radiology nursing throughout the procedure under my direct supervision. CONTRAST:  None FLUOROSCOPY TIME:  24 seconds (1 mGy) COMPLICATIONS: None immediate. PROCEDURE: The procedure, risks, benefits, and alternatives were explained to the patient. Questions regarding the procedure were encouraged and answered. The patient understands and consents to the procedure. The right neck and chest were prepped with chlorhexidine in a sterile fashion, and a sterile drape was applied covering the operative field. Maximum barrier sterile technique with sterile gowns and gloves were used for the procedure. A timeout was performed prior to the  initiation of the procedure. Local anesthesia was provided with 1% lidocaine with epinephrine. After creating a small venotomy incision, a micropuncture kit was utilized to access the internal jugular vein. Real-time ultrasound guidance was utilized for vascular access including the acquisition of a permanent ultrasound image documenting patency of the accessed vessel. The microwire was utilized to measure appropriate catheter length. A subcutaneous port pocket was then created along the upper chest wall utilizing a combination of sharp and blunt dissection. The pocket was irrigated with sterile saline. A single lumen clear view power injectable port was chosen for placement. The 8 Fr catheter was tunneled from the port pocket site to the venotomy incision. The port was placed in the pocket. The external catheter was trimmed to appropriate length. At the venotomy, an 8 Fr peel-away sheath was placed over a guidewire under fluoroscopic guidance. The catheter was then placed through the sheath and the sheath was removed. Final catheter positioning was confirmed and documented with a fluoroscopic spot radiograph. The port was accessed with a Huber needle, aspirated and flushed with heparinized saline. The venotomy site was closed with an interrupted 4-0 Vicryl suture. The port pocket incision was closed with interrupted 2-0 Vicryl suture. The skin was opposed with a running subcuticular 4-0 Vicryl suture. Dermabond and Steri-strips were applied to both incisions. Dressings were applied. The patient tolerated the  procedure well without immediate post procedural complication. FINDINGS: After catheter placement, the tip lies within the superior cavoatrial junction. The catheter aspirates and flushes normally and is ready for immediate use. IMPRESSION: Successful placement of a right internal jugular approach power injectable Port-A-Cath. The catheter is ready for immediate use. Electronically Signed   By: Simonne Come M.D.    On: 01/19/2023 15:46   CT CHEST W CONTRAST Result Date: 01/05/2023 CLINICAL DATA:  Hepatocellular carcinoma, staging. * Tracking Code: BO * EXAM: CT CHEST WITH CONTRAST TECHNIQUE: Multidetector CT imaging of the chest was performed during intravenous contrast administration. RADIATION DOSE REDUCTION: This exam was performed according to the departmental dose-optimization program which includes automated exposure control, adjustment of the mA and/or kV according to patient size and/or use of iterative reconstruction technique. CONTRAST:  50mL OMNIPAQUE IOHEXOL 350 MG/ML SOLN COMPARISON:  06/16/2018 chest CT angiogram.  12/25/2018 CT abdomen. FINDINGS: Cardiovascular: Borderline mild cardiomegaly. Small pericardial effusion. Three-vessel coronary atherosclerosis. Atherosclerotic nonaneurysmal thoracic aorta. Normal caliber pulmonary arteries. No central pulmonary emboli. Mediastinum/Nodes: Peripherally calcified centrally hypodense 0.9 cm right thyroid nodule. Unremarkable esophagus. No pathologically enlarged axillary, mediastinal or hilar lymph nodes. Lungs/Pleura: No pneumothorax. Trace posterior bilateral pleural effusions. Moderate centrilobular emphysema with mild diffuse bronchial wall thickening. Solid slightly spiculated 1.1 cm posteromedial right lower lobe pulmonary nodule (series 4/image 94), with this lung region previously obscured by atelectasis on 06/16/2018 chest CT. No acute consolidative airspace disease, lung masses or additional significant pulmonary nodules. 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 Trace upper abdominal ascites. Musculoskeletal: No aggressive appearing focal osseous lesions. Marked thoracic spondylosis. IMPRESSION: 1. Solid slightly spiculated 1.1 cm posteromedial right lower lobe pulmonary nodule,  suspicious for pulmonary metastasis. 2. No thoracic adenopathy. 3. Trace posterior bilateral pleural effusions. 4. Borderline mild cardiomegaly. Small pericardial effusion. Three-vessel coronary atherosclerosis. 5. 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 Trace upper abdominal ascites. Please see the recent MRI abdomen report for further details. 6. Aortic Atherosclerosis (ICD10-I70.0) and Emphysema (ICD10-J43.9). Electronically Signed   By: Delbert Phenix M.D.   On: 01/05/2023 19:44   MR ABDOMEN MRCP W WO CONTAST Result Date: 01/04/2023 CLINICAL DATA:  Biliary obstruction. Liver metastasis suspected on ultrasound. EXAM: MRI ABDOMEN WITHOUT AND WITH CONTRAST (INCLUDING MRCP) TECHNIQUE: Multiplanar multisequence MR imaging of the abdomen was performed both before and after the administration of intravenous contrast. Heavily T2-weighted images of the biliary and pancreatic ducts were obtained, and three-dimensional MRCP images were rendered by post processing. CONTRAST:  7mL GADAVIST GADOBUTROL 1 MMOL/ML IV SOLN COMPARISON:  Ultrasound 12/30/2022 FINDINGS: Lower chest:  Lung bases are clear. Hepatobiliary: Multiple round enhancing lesions within LEFT and RIGHT hepatic lobe. Lesions reaches confluence in the lateral segment of LEFT hepatic lobe which is nearly complete replaced by enhancing lesions. These lesions demonstrate rapid early arterial enhancement with washout and peripheral pseudo capsules. (Series 1501) The confluent malignant lesions in the LEFT lateral hepatic lobe extend and expand the LEFT portal vein to 18 mm (image 43/series 1504). Findings consistent with tumor thrombus. This thrombus extends to the bifurcation of the main portal vein and partially involves the proximal RIGHT portal vein (image 48). There is biliary duct dilatation in the segment 4A of the liver. Gallbladder is distended to 58 mm.  Several dependent gallstones are present.  There several filling defects within distal common bile duct noted on coronal image 18/series 7. Filling defects stack at the distal common bile duct. The common bile duct is dilated to 11 mm. Several enlarged lymph nodes in the porta hepatis including 19 mm node on image 57/1502. Lymph node adjacent the head of the pancreas measures 11 mm image 73. Pancreas: Normal pancreatic parenchymal intensity. No ductal dilatation or inflammation. Spleen: Normal spleen. Adrenals/urinary tract: Adrenal glands and kidneys are normal. Stomach/Bowel: Stomach and limited of the small bowel is unremarkable Vascular/Lymphatic: Abdominal aortic normal caliber. No retroperitoneal periportal lymphadenopathy. Musculoskeletal: No aggressive osseous lesion IMPRESSION: 1. Multifocal bilobar hepatic malignant tumors. Lesions reach confluence within the LEFT lateral hepatic lobe. 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 Enhancement pattern and portal vein tumor thrombosis are typical of hepatocellular carcinoma. 2. Porta hepatis lymphadenopathy. 3. Distal choledocholithiasis mild extrahepatic duct dilatation. Gallbladder distension. These results will be called to the ordering clinician or representative by the Radiologist Assistant, and communication documented in the PACS or [employer]. Electronically Signed   By: Genevive Bi M.D.   On: 01/04/2023 08:59   MR 3D Recon At Scanner Result Date: 01/04/2023 CLINICAL DATA:  Biliary obstruction. Liver metastasis suspected on ultrasound. EXAM: MRI ABDOMEN WITHOUT AND WITH CONTRAST (INCLUDING MRCP) TECHNIQUE: Multiplanar multisequence MR imaging of the abdomen was performed both before and after the administration of intravenous contrast. Heavily T2-weighted images of the biliary and pancreatic ducts were obtained, and three-dimensional  MRCP images were rendered by post processing. CONTRAST:  7mL GADAVIST GADOBUTROL 1 MMOL/ML IV SOLN COMPARISON:  Ultrasound 12/30/2022 FINDINGS: Lower chest:  Lung bases are clear. Hepatobiliary: Multiple round enhancing lesions within LEFT and RIGHT hepatic lobe. Lesions reaches confluence in the lateral segment of LEFT hepatic lobe which is nearly complete replaced by enhancing lesions. These lesions demonstrate rapid early arterial enhancement with washout and peripheral pseudo capsules. (Series 1501) The confluent malignant lesions in the LEFT lateral hepatic lobe extend and expand the LEFT portal vein to 18 mm (image 43/series 1504). Findings consistent with tumor thrombus. This thrombus extends to the bifurcation of the main portal vein and partially involves the proximal RIGHT portal vein (image 48). There is biliary duct dilatation in the segment 4A of the liver. Gallbladder is distended to 58 mm. Several dependent gallstones are present. There several filling defects within distal common bile duct noted on coronal image 18/series 7. Filling defects stack at the distal common bile duct. The common bile duct is dilated to 11 mm. Several enlarged lymph nodes in the porta hepatis including 19 mm node on image 57/1502. Lymph node adjacent the head of the pancreas measures 11 mm image 73. Pancreas: Normal pancreatic parenchymal intensity. No ductal dilatation or inflammation. Spleen: Normal spleen. Adrenals/urinary tract: Adrenal glands and kidneys are normal. Stomach/Bowel: Stomach and limited of the small bowel is unremarkable Vascular/Lymphatic: Abdominal aortic normal caliber. No retroperitoneal periportal lymphadenopathy. Musculoskeletal: No aggressive osseous lesion IMPRESSION: 1. Multifocal bilobar hepatic malignant tumors. Lesions reach confluence within the LEFT lateral hepatic lobe. Tumor invasion the portal veins with large tumor thrombus within the LEFT portal vein and early tumor thrombus involvement  of the RIGHT portal vein. Enhancement pattern and portal vein tumor thrombosis are typical of hepatocellular carcinoma. 2. Porta hepatis lymphadenopathy. 3. Distal choledocholithiasis mild extrahepatic duct dilatation. Gallbladder distension. These results will be called to the ordering clinician or representative by  the Printmaker, and communication documented in the PACS or [employer]. Electronically Signed   By: Genevive Bi M.D.   On: 01/04/2023 08:59   US Abdomen Limited RUQ (LIVER/GB) Result Date: 01/03/2023 CLINICAL DATA:  151471 RUQ pain 151471 EXAM: ULTRASOUND ABDOMEN LIMITED RIGHT UPPER QUADRANT COMPARISON:  RIGHT upper quadrant ultrasound, 12/30/2022. CT AP, 12/25/2018. FINDINGS: Gallbladder: 1.3 cm dependent echogenic gallstone within a mildly distended gallbladder. Gallbladder wall measures greater than the upper level normal thickness, at 0.4 cm. Trace pericholecystic fluid. A POSITIVE sonographic Eulah Pont sign was reported by sonographer. Common bile duct: Diameter: Distended, measuring up to 0.9 cm. Liver: Increased hepatic echogenicity, with a similar appearance of multifocal heterogeneously-echogenic hepatic masses, greatest within the LEFT hepatic lobe and measuring up to 5 cm. See key image. 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 The main portal vein appears patent on color Doppler imaging with normal direction of blood flow towards the liver. Other: No perihepatic free fluid. IMPRESSION: 1. Cholelithiasis with sonographic findings highly suspicious for acute calculus cholecystitis. 2. Common bile duct distention, suspicious for choledocholithiasis. Correlate with MRCP and obstructive liver enzymes. 3. Multifocal hepatic masses, largest within the LEFT lobe measuring up to 5 cm. These are incompletely characterized on this evaluation. Recommend dedicated, contrasted MR abdomen for further evaluation. These results will be called to the ordering  clinician or representative by the Radiologist Assistant, and communication documented in the PACS or [employer]. Electronically Signed   By: Roanna Banning M.D.   On: 01/03/2023 12:31   US Abdomen Limited RUQ (LIVER/GB) Result Date: 12/30/2022 CLINICAL DATA:  Right upper quadrant pain and nausea for 3 weeks. EXAM: ULTRASOUND ABDOMEN LIMITED RIGHT UPPER QUADRANT COMPARISON:  CT on 12/25/2018 FINDINGS: Gallbladder: Gallbladder is poorly distended which limits evaluation. Probable 8 mm gallstone noted. Diffuse gallbladder wall thickening is seen which may be due to underdistention, although other possibilities include cholecystitis, hepatocellular disease, and pancreatitis. No sonographic Murphy sign noted by sonographer. Common bile duct: Diameter: 11 mm, which is mildly dilated. Liver: Multiple hepatic masses are seen which show variable echogenicity. Largest in the left lobe measures 4.8 cm. These are highly suspicious for liver metastases. Portal vein is patent on color Doppler imaging with normal direction of blood flow towards the liver. Other: None. IMPRESSION: Multiple hepatic masses, highly suspicious for liver metastases. Recommend abdomen MRI without and with contrast for further characterization. Nondistended gallbladder with probable cholelithiasis. Diffuse gallbladder wall thickening is nonspecific, and may be due to underdistention, although other considerations include cholecystitis, hepatocellular disease, and pancreatitis. Mildly dilated common bile duct, measuring 11 mm. This could also be further evaluated by MRI/MRCP. Electronically Signed   By: Danae Orleans M.D.   On: 12/30/2022 11:29    CODE STATUS:  Code Status History     Date Active Date Inactive Code Status Order ID Comments User Context   01/19/2023 1012 01/20/2023 0509 Full Code [id number]  Simonne Come, MD HOV   01/05/2023 1031 01/06/2023 2334 Do not attempt resuscitation (DNR) PRE-ARREST INTERVENTIONS DESIRED [id number]  Cyndia Skeeters, DO Inpatient   01/03/2023 1721 01/05/2023 1031 Full Code [id number]  Cyndia Skeeters, DO ED   07/04/2018 2357 07/14/2018 2145 Full Code [id number]  Briscoe Deutscher, MD Inpatient   06/12/2018 1347 06/18/2018 1635 Full Code [id number]  Erick Blinks, DO ED    Questions for Most Recent Historical Code Status (Order [id number])     Question Answer   By: Consent: discussion documented in EHR  Orders Placed This Encounter  Procedures   CBC with Differential (Cancer Center Only)    Standing Status:   Future    Expected Date:   04/15/2023    Expiration Date:   04/14/2024   CMP (Cancer Center only)    Standing Status:   Future    Expected Date:   04/15/2023    Expiration Date:   04/14/2024     Future Appointments  Date Time Provider Department Center  01/28/2023 10:00 AM Audrie Gallus, RN [facility] None  01/28/2023  2:30 PM Gabriel Earing, FNP [facility] None  02/18/2023 10:30 AM [facility] FLUSH [facility] None  02/18/2023 11:00 AM Kim Oki, Archie Patten, MD [facility] None  02/18/2023 11:30 AM [facility] INFUSION [facility] None  05/04/2023 10:00 AM Gabriel Earing, FNP [facility] None  12/08/2023  9:20 AM [facility]-ANNUAL WELLNESS VISIT [facility] None      This document was completed utilizing speech recognition software. Grammatical errors, random word insertions, pronoun errors, and incomplete sentences are an occasional consequence of this system due to software limitations, ambient noise, and hardware issues. Any formal questions or concerns about the content, text or information contained within the body of this dictation should be directly addressed to the provider for clarification.

## 2023-01-21 NOTE — Patient Instructions (Signed)
 [facility] - A DEPT OF [facility]  Discharge Instructions: Thank you for choosing [facility] to provide your oncology and hematology care.   If you have a lab appointment with the Cancer Center, please go directly to the Cancer Center and check in at the registration area.   Wear comfortable clothing and clothing appropriate for easy access to any Portacath or PICC line.   We strive to give you quality time with your provider. You may need to reschedule your appointment if you arrive late (15 or more minutes).  Arriving late affects you and other patients whose appointments are after yours.  Also, if you miss three or more appointments without notifying the office, you may be dismissed from the clinic at the provider's discretion.      For prescription refill requests, have your pharmacy contact our office and allow 72 hours for refills to be completed.    Today you received the following chemotherapy and/or immunotherapy agents: Imfinzi      To help prevent nausea and vomiting after your treatment, we encourage you to take your nausea medication as directed.  BELOW ARE SYMPTOMS THAT SHOULD BE REPORTED IMMEDIATELY: *FEVER GREATER THAN 100.4 F (38 C) OR HIGHER *CHILLS OR SWEATING *NAUSEA AND VOMITING THAT IS NOT CONTROLLED WITH YOUR NAUSEA MEDICATION *UNUSUAL SHORTNESS OF BREATH *UNUSUAL BRUISING OR BLEEDING *URINARY PROBLEMS (pain or burning when urinating, or frequent urination) *BOWEL PROBLEMS (unusual diarrhea, constipation, pain near the anus) TENDERNESS IN MOUTH AND THROAT WITH OR WITHOUT PRESENCE OF ULCERS (sore throat, sores in mouth, or a toothache) UNUSUAL RASH, SWELLING OR PAIN  UNUSUAL VAGINAL DISCHARGE OR ITCHING   Items with * indicate a potential emergency and should be followed up as soon as possible or go to the Emergency Department if any problems should occur.  Please show the CHEMOTHERAPY ALERT CARD or IMMUNOTHERAPY  ALERT CARD at check-in to the Emergency Department and triage nurse.  Should you have questions after your visit or need to cancel or reschedule your appointment, please contact [facility] - A DEPT OF [facility]  Dept: [phone]  and follow the prompts.  Office hours are 8:00 a.m. to 4:30 p.m. Monday - Friday. Please note that voicemails left after 4:00 p.m. may not be returned until the following business day.  We are closed weekends and major holidays. You have access to a nurse at all times for urgent questions. Please call the main number to the clinic Dept: [phone] and follow the prompts.   For any non-urgent questions, you may also contact your provider using MyChart. We now offer e-Visits for anyone 80 and older to request care online for non-urgent symptoms. For details visit [email or web address].   Also download the MyChart app! Go to the app store, search "MyChart", open the app, select , and log in with your MyChart username and password.

## 2023-01-21 NOTE — Patient Outreach (Signed)
Care Management  Transitions of Care Program Transitions of Care Post-discharge week 3   01/21/2023 Name: Heather Oconnor MRN: [id number] DOB: 1940-04-10  Subjective: Heather Oconnor is a 83 y.o. year old female who is a primary care patient of Gabriel Earing, FNP. The Care Management team Engaged with patient Engaged with patient by telephone to assess and address transitions of care needs.   Consent to Services:  Patient was given information about care management services, agreed to services, and gave verbal consent to participate.   Assessment: Patient is doing well, had porta cath placed, begins infusion (immunotherapy) today, [facility] continues, no new concerns reported.         SDOH Interventions    Flowsheet Row Telephone from 01/07/2023 in [place] [facility] Clinical Support from 12/07/2022 in [facility] Office Visit from 01/06/2022 in [facility] Clinical Support from 12/02/2021 in [facility] Office Visit from 09/01/2021 in [facility] Office Visit from 03/03/2021 in [facility]  SDOH Interventions        Food Insecurity Interventions Intervention Not Indicated Intervention Not Indicated -- Intervention Not Indicated -- --  Housing Interventions Intervention Not Indicated Intervention Not Indicated -- Intervention Not Indicated -- --  Transportation Interventions Intervention Not Indicated Intervention Not Indicated -- -- -- --  Utilities Interventions Intervention Not Indicated Intervention Not Indicated -- -- -- --  Alcohol Usage Interventions -- Intervention Not Indicated (Score <7) -- -- -- --  Depression Interventions/Treatment  -- -- PHQ2-9 Score <4 Follow-up Not Indicated -- PHQ2-9 Score <4 Follow-up Not Indicated PHQ2-9 Score <4 Follow-up Not Indicated  Financial Strain  Interventions -- Intervention Not Indicated -- Intervention Not Indicated -- --  Physical Activity Interventions -- Intervention Not Indicated -- Intervention Not Indicated -- --  Stress Interventions -- Intervention Not Indicated -- Intervention Not Indicated -- --  Social Connections Interventions -- Intervention Not Indicated -- Intervention Not Indicated -- --  Health Literacy Interventions -- Intervention Not Indicated -- -- -- --        Goals Addressed             This Visit's Progress    Transition of Care/ pt will have no readmissions within 30 days       Current Barriers:  Provider appointments per daughter she nor pt knows name of oncologist or phone number for pt follow up Home Health services [facility] has not contacted pt to schedule home visit Chronic Disease Management support and education needs related to Liver carcinoma/ cholecystitis  Spoke with patient who prefers RN Care Manager speak with her daughter Anthoney Harada, per daughter pt is feeling much better, nausea is under control, appetite is somewhat better, pt is drinking protein powder mixed with almond milk and seems to like this (once per day), pt had porta cath placed and to start immunotherapy treatment today,  saw oncologist Dr. Arlana Pouch on 01/13/23  RNCM Clinical Goal(s):  Patient will work with the Care Management team over the next 30 days to address Transition of Care Barriers: Provider appointments Home Health services verbalize understanding of plan for management of Liver carcinoma/ cholecystitis as evidenced by pt/ cg report, review of EMR and  through collaboration with RN Care manager, provider, and care team.   Interventions: Evaluation of current treatment plan related to  self management and patient's adherence to plan as established  by provider Liver carcinoma/ cholecystits  (Status:  New goal. and Goal on track:  Yes.)  Short Term Goal Evaluation of current treatment plan related to   Liver carcinoma/ cholecystitis ,  home health, oncology follow up  self-management and patient's adherence to plan as established by provider. Evaluation of current treatment plan related to liver carcinoma, cholecystitis and patient's adherence to plan as established by provider Reviewed medications with patient and discussed importance of taking as prescribed Discussed plans with patient for ongoing care management follow up and provided patient with direct contact information for care management team Reviewed upcoming scheduled appointments including primary care provider 01/28/23 @ 230 pm Encouraged to continue working with home health and completing prescribed exercises  Patient Goals/Self-Care Activities: Participate in Transition of Care Program/Attend [facility] scheduled calls Notify RN Care Manager of TOC call rescheduling needs Take all medications as prescribed Attend all scheduled provider appointments Call pharmacy for medication refills 3-7 days in advance of running out of medications Call provider office for new concerns or questions  Please follow up with primary care provider on 01/28/23 @ 230 pm Continue to work with [facility]  [phone]  Follow Up Plan:  Telephone follow up appointment with care management team member scheduled for:  01/28/23 @ 10 am The patient has been provided with contact information for the care management team and has been advised to call with any health related questions or concerns.          Plan: Telephone follow up appointment with care management team member scheduled for: 01/28/23 @ 10 am  Irving Shows [facility], BSN RN Care Manager/ Transition of Care [place]/ [facility] [phone]

## 2023-01-21 NOTE — Assessment & Plan Note (Signed)
Decreased appetite and difficulty eating dry foods. Discussed oral Megace to improve appetite; patient prefers to wait for potential improvement with immunotherapy. - Reassess appetite and consider Megace on RTC, if no improvement.

## 2023-01-21 NOTE — Assessment & Plan Note (Addendum)
Confirmed diagnosis based on MRI findings and elevated ASP marker (107). 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   Discussed her case in GI conference on 01/12/2023.  Depending on performance status, she will be considered for liver directed therapy versus systemic treatments.  -Discussed diagnosis, prognosis, staging, treatment options.  Reviewed NCCN guidelines.    -Her performance status was fairly good until recent hospitalization.  Even now she is trying to be independent as much as she can.   -Discussed several systemic treatment options.   Plan to proceed with single agent durvalumab treatments.  We will avoid tremelimumab because of her visual disturbances.  Also avoiding Avastin since we cannot get EGD to rule out varices.  Also referral sent to IR for TACE of most dominant lesions, in an attempt to decrease tumor burden.  If scheduled, she will need bridging therapy with Lovenox while holding Eliquis periprocedurally.  Explained immunotherapy mechanism, potential for survival extension, and quality of life improvement. Risks include auto-immune side effects. Patient and family opted for immunotherapy alone due to eye issues and potential side effects of combination therapy.  -She had Port-A-Cath placed on 01/19/2023.  -Labs reviewed.  No dose-limiting toxicities.  Will proceed with cycle 1 of durvalumab at 1500 mg IV dose today.  Plan to continue this every 4 weeks.  RTC in 4 weeks for labs, follow-up and continuation of durvalumab treatment.

## 2023-01-21 NOTE — Patient Instructions (Signed)
Visit Information  Thank you for taking time to visit with me today. Please don't hesitate to contact me if I can be of assistance to you before our next scheduled telephone appointment.  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 Arlana Pouch on 01/13/23  RNCM Clinical Goal(s):  Patient will work with the Care Management team over the next 30 days to address Transition of Care Barriers: Provider appointments Home Health services verbalize understanding of plan for management of Liver carcinoma/ cholecystitis as evidenced by pt/ cg report, review of EMR and  through collaboration with RN Care manager, provider, and care team.   Interventions: Evaluation of current treatment plan related to  self management and patient's adherence to plan as established by provider Liver carcinoma/ cholecystits  (Status:  New goal. and Goal on track:  Yes.)  Short Term Goal Evaluation of current treatment plan related to  Liver carcinoma/ cholecystitis ,  home health, oncology follow up  self-management and patient's adherence to plan as established by provider. Evaluation of current treatment plan related to liver carcinoma,  cholecystitis and patient's adherence to plan as established by provider Reviewed medications with patient and discussed importance of taking as prescribed Discussed plans with patient for ongoing care management follow up and provided patient with direct contact information for care management team Reviewed upcoming scheduled appointments including primary care provider 01/28/23 @ 230 pm Encouraged to continue working with home health and completing prescribed exercises  Patient Goals/Self-Care Activities: Participate in Transition of Care Program/Attend [facility] scheduled calls Notify RN Care Manager of TOC call rescheduling needs Take all medications as prescribed Attend all scheduled provider appointments Call pharmacy for medication refills 3-7 days in advance of running out of medications Call provider office for new concerns or questions  Please follow up with primary care provider on 01/28/23 @ 230 pm Continue to work with [facility]  [phone]  Follow Up Plan:  Telephone follow up appointment with care management team member scheduled for:  01/28/23 @ 10 am The patient has been provided with contact information for the care management team and has been advised to call with any health related questions or concerns.           Our next appointment is by telephone on 01/28/23 at 10 am  Please call the care guide team at [phone] if you need to cancel or reschedule your appointment.   If you are experiencing a Mental Health or Behavioral Health Crisis or need someone to talk to, please call the Suicide and Crisis Lifeline: 988 call the [place] National Suicide Prevention Lifeline: 2026157358 or TTY: (640) 432-1271 TTY 763 801 2054) to talk  to a trained counselor call 1-800-273-TALK (toll free, 24 hour hotline) go to [facility] [address] [place] [phone]) call the [facility] Crisis Line: [phone] call 911    Patient verbalizes understanding of instructions and care plan provided today and agrees to view in MyChart. Active MyChart status and patient understanding of how to access instructions and care plan via MyChart confirmed with patient.     Irving Shows [facility], BSN RN Care Manager/ Transition of Care [place]/ [address] [facility] [phone]

## 2023-01-22 LAB — T4: T4, Total: 7.3 ug/dL (ref 4.5–12.0)

## 2023-01-24 ENCOUNTER — Telehealth: Payer: Self-pay | Admitting: Oncology

## 2023-01-24 ENCOUNTER — Encounter: Payer: Self-pay | Admitting: Family Medicine

## 2023-01-24 ENCOUNTER — Encounter: Payer: Self-pay | Admitting: Oncology

## 2023-01-24 DIAGNOSIS — E1122 Type 2 diabetes mellitus with diabetic chronic kidney disease: Secondary | ICD-10-CM | POA: Diagnosis not present

## 2023-01-24 DIAGNOSIS — I5032 Chronic diastolic (congestive) heart failure: Secondary | ICD-10-CM | POA: Diagnosis not present

## 2023-01-24 DIAGNOSIS — I130 Hypertensive heart and chronic kidney disease with heart failure and stage 1 through stage 4 chronic kidney disease, or unspecified chronic kidney disease: Secondary | ICD-10-CM | POA: Diagnosis not present

## 2023-01-24 DIAGNOSIS — I81 Portal vein thrombosis: Secondary | ICD-10-CM | POA: Diagnosis not present

## 2023-01-24 DIAGNOSIS — I2510 Atherosclerotic heart disease of native coronary artery without angina pectoris: Secondary | ICD-10-CM | POA: Diagnosis not present

## 2023-01-24 DIAGNOSIS — K8010 Calculus of gallbladder with chronic cholecystitis without obstruction: Secondary | ICD-10-CM | POA: Diagnosis not present

## 2023-01-24 DIAGNOSIS — C220 Liver cell carcinoma: Secondary | ICD-10-CM | POA: Diagnosis not present

## 2023-01-24 DIAGNOSIS — N1832 Chronic kidney disease, stage 3b: Secondary | ICD-10-CM | POA: Diagnosis not present

## 2023-01-24 DIAGNOSIS — E43 Unspecified severe protein-calorie malnutrition: Secondary | ICD-10-CM | POA: Diagnosis not present

## 2023-01-24 NOTE — Telephone Encounter (Signed)
Called pt to see how she did with her recent treatment.  She reports having diarrhea & has taken imodium 2 to start with & then once more after second diarrhea stool.  Encouraged to drink plenty of fluids & to decrease fiber in her diet.  She thought that she was supposed to increase fiber & asked me to call her daughter.  Called daughter & informed of above & encourage to call if diarrhea not better with imodium.  She reports knowing how to reach [place] if needed & she knows next appts & saw new appts for March in [place].

## 2023-01-24 NOTE — Telephone Encounter (Signed)
 Marland Kitchen

## 2023-01-24 NOTE — Telephone Encounter (Signed)
-----   Message from Nurse Billey Chang sent at 01/21/2023  4:10 PM EST ----- Regarding: 1st time Imfinzi , Dr. Arlana Pouch patient Hello,   Ms. Castilleja received her 1st Imfinzi infusion today. Everything went smoothly, please check on her Monday. Thanks.

## 2023-01-25 ENCOUNTER — Inpatient Hospital Stay: Payer: Medicare HMO | Admitting: Dietician

## 2023-01-25 NOTE — Progress Notes (Signed)
Nutrition Assessment   Reason for Assessment: MST screen for weight loss.    ASSESSMENT: Patient is an 83 year old with newly diagnosed Hepatocellular carcinoma. Multiple hepatic lesions preclude surgery. She will be treated with durvalumab every 4 weeks. She is followed by Dr. Arlana Pouch and received her first dose on 01/20/22.  Patient reports weight loss has been r/t to many months of dry mouth with meats and chicken and breads tasting too dry and she can't eat them.  She has been using mostly a liquid diet.  Daughter makes a protein smoothie for her and she has applesauce with meds in morning and mashed potatoes with gravy.  She was seen by IP RD 01/04/23 and diagnosed with sever malnutrition and continues to lose weight.  Patient also reports she has had diarrhea past 2 days with 2-3 loose stools and very low blood pressure. Question whether she is dehydrated. Patient states her daughter takes very good care of her and will answer any questions.  Reached out to Tabitha at her cell#.  She relayed protein smoothie is made of almond milk, bananas or strawberries and protein powder.  She is concerned with sugary beverages and patients blood sugars.     Anthropometrics:   Height: 63" Weight: 130.7# UBW: 155 (not since 07/2022) BMI: 23.15    NUTRITION DIAGNOSIS: Inadequate PO intake to meet increased nutrient needs, r/t xerostomia and  cancer diagnosis  MALNUTRITION DIAGNOSIS:  Severe Malnutrition related to chronic illness as evidenced by severe muscle depletion, severe fat depletion, percent weight loss, energy intake < or equal to 75% for > or equal to 1 month (9% weight loss in 3 months).    INTERVENTION:  Relayed that nutrition services are wrap around service provided at no charge and encouraged continued communication if experiencing continued weight loss or any nutritional impact symptoms (NIS). Educated on importance of adequate calorie and protein energy intake  with nutrient dense foods  and liquids to maintain weight/strength Encouraged adequate hydration and strategies for diarrhea. Encouraged small frequent feeds and trying to eat 6 small meals Suggested Glucerna products for oral nutrition supplements and adding her protein powders into mashed potatoes or rice pudding Discussed strategies for dry mouth including trial of Biotene or Lubricity products Emailed product information and diarrhea tips to daughter Tabitha at [email or web address] with contact information provided   MONITORING, EVALUATION, GOAL: weight, PO intake, Nutrition Impact Symptoms, labs Goal is weight maintenance  Next Visit: Remote 2 weeks, Tabitha has access to MyChart with encourage patient but send info to daughter.  Gennaro Africa, RDN, LDN Registered Dietitian, [facility] Part Time Remote (Usual office hours: Tuesday-Thursday) Cell: [phone]

## 2023-01-26 ENCOUNTER — Ambulatory Visit: Payer: Medicare HMO

## 2023-01-26 DIAGNOSIS — N1832 Chronic kidney disease, stage 3b: Secondary | ICD-10-CM | POA: Diagnosis not present

## 2023-01-26 DIAGNOSIS — K589 Irritable bowel syndrome without diarrhea: Secondary | ICD-10-CM

## 2023-01-26 DIAGNOSIS — C220 Liver cell carcinoma: Secondary | ICD-10-CM | POA: Diagnosis not present

## 2023-01-26 DIAGNOSIS — I5032 Chronic diastolic (congestive) heart failure: Secondary | ICD-10-CM | POA: Diagnosis not present

## 2023-01-26 DIAGNOSIS — E1122 Type 2 diabetes mellitus with diabetic chronic kidney disease: Secondary | ICD-10-CM

## 2023-01-26 DIAGNOSIS — I130 Hypertensive heart and chronic kidney disease with heart failure and stage 1 through stage 4 chronic kidney disease, or unspecified chronic kidney disease: Secondary | ICD-10-CM | POA: Diagnosis not present

## 2023-01-26 DIAGNOSIS — K8010 Calculus of gallbladder with chronic cholecystitis without obstruction: Secondary | ICD-10-CM

## 2023-01-26 DIAGNOSIS — J449 Chronic obstructive pulmonary disease, unspecified: Secondary | ICD-10-CM

## 2023-01-26 DIAGNOSIS — I81 Portal vein thrombosis: Secondary | ICD-10-CM

## 2023-01-26 DIAGNOSIS — I2510 Atherosclerotic heart disease of native coronary artery without angina pectoris: Secondary | ICD-10-CM | POA: Diagnosis not present

## 2023-01-26 DIAGNOSIS — K219 Gastro-esophageal reflux disease without esophagitis: Secondary | ICD-10-CM

## 2023-01-26 DIAGNOSIS — E43 Unspecified severe protein-calorie malnutrition: Secondary | ICD-10-CM

## 2023-01-27 DIAGNOSIS — C220 Liver cell carcinoma: Secondary | ICD-10-CM | POA: Diagnosis not present

## 2023-01-27 DIAGNOSIS — I130 Hypertensive heart and chronic kidney disease with heart failure and stage 1 through stage 4 chronic kidney disease, or unspecified chronic kidney disease: Secondary | ICD-10-CM | POA: Diagnosis not present

## 2023-01-27 DIAGNOSIS — N1832 Chronic kidney disease, stage 3b: Secondary | ICD-10-CM | POA: Diagnosis not present

## 2023-01-27 DIAGNOSIS — K8010 Calculus of gallbladder with chronic cholecystitis without obstruction: Secondary | ICD-10-CM | POA: Diagnosis not present

## 2023-01-27 DIAGNOSIS — I2510 Atherosclerotic heart disease of native coronary artery without angina pectoris: Secondary | ICD-10-CM | POA: Diagnosis not present

## 2023-01-27 DIAGNOSIS — E1122 Type 2 diabetes mellitus with diabetic chronic kidney disease: Secondary | ICD-10-CM | POA: Diagnosis not present

## 2023-01-27 DIAGNOSIS — I81 Portal vein thrombosis: Secondary | ICD-10-CM | POA: Diagnosis not present

## 2023-01-27 DIAGNOSIS — E43 Unspecified severe protein-calorie malnutrition: Secondary | ICD-10-CM | POA: Diagnosis not present

## 2023-01-27 DIAGNOSIS — I5032 Chronic diastolic (congestive) heart failure: Secondary | ICD-10-CM | POA: Diagnosis not present

## 2023-01-27 NOTE — Progress Notes (Signed)
Palliative Medicine [facility]  Telephone:([phone] Fax:([phone]) [phone]   Name: Heather Oconnor Date: 01/27/2023 MRN: [id number]  DOB: 1940-06-13  Patient Care Team: Gabriel Earing, FNP as PCP - General (Family Medicine) Rollene Rotunda, MD as PCP - Cardiology (Cardiology) Jena Gauss Gerrit Friends, MD as Consulting Physician (Gastroenterology) Bond, Doran Stabler, MD as Consulting Physician (Ophthalmology) Smitty Cords, OD (Optometry) Audrie Gallus, RN as [facility] Care Management Pickenpack-Cousar, Arty Baumgartner, NP as Nurse Practitioner [facility] and Palliative Medicine)   I connected with Heather Oconnor on 02/04/23 at 12:30 PM EST by telephone and verified that I am speaking with the correct person using two identifiers.   I discussed the limitations, risks, security and privacy concerns of performing an evaluation and management service by telemedicine and the availability of in-person appointments. I also discussed with the patient that there may be a patient responsible charge related to this service. The patient expressed understanding and agreed to proceed.   Other persons participating in the visit and their role in the encounter: N/A   Patient's location: Home   Provider's location: [facility]    INTERVAL HISTORY: Heather Oconnor is a 83 y.o. female with oncologic medical history including newly diagnosed  hepatocellular carcinoma (01/2023). Past history also includes resistant HTN, CVA, CAD, T2DM, GERD, and IBS. Palliative ask to see for symptom management and goals of care.   SOCIAL HISTORY:     reports that she quit smoking about 16 years ago. Her smoking use included cigarettes. She started smoking about 25 years ago. She has never used smokeless tobacco. She reports that she does not drink alcohol and does not use drugs.  ADVANCE DIRECTIVES:  None on file  CODE STATUS: DNR  PAST MEDICAL HISTORY: Past Medical History:  Diagnosis  Date   Blind right eye    Cholecystitis    Cirrhosis (HCC)    Diabetes (HCC)    Diabetic nephropathy (HCC) 02/04/2020   GERD (gastroesophageal reflux disease)    Hemorrhoids    Hypertension    IBS (irritable bowel syndrome)    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   Qualifier: Diagnosis of  By: Diana Eves     Pancreatitis    Transient cerebral ischemia 05/08/2008   Qualifier: Diagnosis of  By: Diana Eves      ALLERGIES:  is allergic to hctz [hydrochlorothiazide] and lisinopril.  MEDICATIONS:  Current Outpatient Medications  Medication Sig Dispense Refill   amLODipine (NORVASC) 10 MG tablet TAKE 1 TABLET EVERY DAY 90 tablet 1   apixaban (ELIQUIS) 5 MG TABS tablet Take 1 tablet (5 mg total) by mouth 2 (two) times daily. 60 tablet 5   atorvastatin (LIPITOR) 40 MG tablet TAKE 1 TABLET EVERY DAY 30 tablet 0   bisoprolol (ZEBETA) 5 MG tablet TAKE 1 AND 1/2 TABLETS EVERY DAY 135 tablet 0   cloNIDine (CATAPRES) 0.2 MG tablet TAKE 1 TABLET TWICE DAILY 180 tablet 1   dorzolamide-timolol (COSOPT) 22.3-6.8 MG/ML ophthalmic solution Place 1 drop into both eyes 2 (two) times daily.     hydrALAZINE (APRESOLINE) 100 MG tablet TAKE 1 TABLET THREE TIMES DAILY 270 tablet 1   lidocaine-prilocaine (EMLA) cream Apply to affected area once 30 g 3   metFORMIN (GLUCOPHAGE) 500 MG tablet TAKE 2 TABLETS TWICE DAILY WITH MEALS (Patient taking differently: Take 1,000 mg by mouth 2 (two) times daily with a meal. Patient has only been taking one tablet twice daily due to illness. 01/03/2023.) 360 tablet  1   mirtazapine (REMERON) 15 MG tablet TAKE 1 TABLET AT BEDTIME 90 tablet 3   Multiple Vitamin (MULTIVITAMIN WITH MINERALS) TABS tablet Take 1 tablet by mouth daily. (Patient not taking: Reported on 01/21/2023) 30 tablet 0   ondansetron (ZOFRAN) 4 MG tablet Take 1 tablet (4 mg total) by mouth every 8 (eight) hours as needed for nausea or vomiting. 20 tablet 0   ondansetron (ZOFRAN) 8 MG tablet Take 1 tablet (8 mg  total) by mouth every 8 (eight) hours as needed for nausea or vomiting. (Patient not taking: Reported on 01/21/2023) 30 tablet 1   pantoprazole (PROTONIX) 40 MG tablet TAKE 1 TABLET EVERY DAY 90 tablet 1   prochlorperazine (COMPAZINE) 10 MG tablet Take 1 tablet (10 mg total) by mouth every 6 (six) hours as needed for nausea or vomiting. (Patient not taking: Reported on 01/21/2023) 30 tablet 1   spironolactone (ALDACTONE) 25 MG tablet Take 1 tablet (25 mg total) by mouth daily. 90 tablet 3   thiamine (VITAMIN B1) 100 MG tablet Take 1 tablet (100 mg total) by mouth daily. (Patient not taking: Reported on 01/21/2023) 30 tablet 0   TRUE METRIX BLOOD GLUCOSE TEST test strip 1 each by Other route 4 (four) times daily as needed.     TRUEplus Lancets 33G MISC Test BS up to four times daily as needed Dx E11.9 400 each 3   valsartan (DIOVAN) 320 MG tablet Take 1 tablet (320 mg total) by mouth daily. 90 tablet 1   vitamin B-12 (CYANOCOBALAMIN) 1000 MCG tablet Take 1,000 mcg by mouth daily. (Patient not taking: Reported on 01/21/2023)     Vitamin D, Ergocalciferol, (DRISDOL) 1.25 MG (50000 UNIT) CAPS capsule Take 1 capsule (50,000 Units total) by mouth every 7 (seven) days. (Patient not taking: Reported on 01/21/2023) 12 capsule 0   No current facility-administered medications for this visit.    VITAL SIGNS: There were no vitals taken for this visit. There were no vitals filed for this visit.  Estimated body mass index is 23.15 kg/m as calculated from the following:   Height as of 01/19/23: 5\' 3"  (1.6 m).   Weight as of 01/21/23: 130 lb 11.2 oz (59.3 kg).   PERFORMANCE STATUS (ECOG) : 1 - Symptomatic but completely ambulatory   IMPRESSION: I connected with Heather Oconnor by phone for follow-up. No acute distress noted. Denies nausea, vomiting, constipation, or diarrhea. She continues to closely work with home health PT expressing desires to no longer require walker when ambulating but knows for now she still  needs it for safety. Acknowledges that she does feel somewhat stronger than previous weeks. We discussed continuing to take one day at a time.   Heather Oconnor states she tolerated her first treatment without significant difficulty and hoping for the same with future treatments.   Denies pain or discomfort. Her biggest concern is her ongoing struggles with swallowing specifically foods that are dry such as breads. She shares frustration of having an appetite and desiring certain foods such as pinto beans and cornbread. I discussed if she had tried the use of gravies or natural food juices to help with moisture. The patient reports she has been using gravies and this has been helpful. She has been able to enjoy mashed potatoes with gravy. Recommended trying pinto beans with a lot of natural bean juice when cooking and if she really wanted the cornbread could crumble it in to add moisture. She verbalized understanding and appreciation.   No symptom management  needs at this time. We will continue to support as needed.   I discussed the importance of continued conversation with family and their medical providers regarding overall plan of care and treatment options, ensuring decisions are within the context of the patients values and GOCs.  PLAN:  Difficulty swallowing pills Patient reports difficulty swallowing pills, causing discomfort and a sensation of choking. Discussed various strategies to facilitate pill swallowing, including using applesauce, pudding, and lemon/ginger candies. -Continue to use applesauce or pudding to facilitate pill swallowing. -Try using lemon/ginger candies before and after pill intake to neutralize taste.  Decreased appetite and altered taste Patient reports decreased appetite and altered taste, particularly finding dry foods difficult to consume. Discussed the use of sauces, gravies, condiments, and moist foods to improve food intake. -Encourage intake of moist foods and use  of sauces/condiments to improve taste and facilitate swallowing. -Consider protein shakes and Boost Breeze as alternative sources of nutrition. Samples provided.    Cancer treatment Discussed the plan to start immunotherapy, with the hope of improving strength and independence. -Tolerated initial treatment. Hopeful this will continue with future treatments.  -Consider port placement for treatment administration.  Follow-up and communication Encouraged the use of MyChart for communication and follow-up. -Continue with physical therapy to improve strength and independence. -I will plan to see patient back in 2-3 weeks. Sooner if needed.  Patient expressed understanding and was in agreement with this plan. She also understands that She can call the clinic at any time with any questions, concerns, or complaints.   Any controlled substances utilized were prescribed in the context of palliative care. PDMP has been reviewed.   Visit consisted of counseling and education dealing with the complex and emotionally intense issues of symptom management and palliative care in the setting of serious and potentially life-threatening illness.  Willette Alma, AGPCNP-BC  Palliative Medicine Team/[facility]

## 2023-01-28 ENCOUNTER — Other Ambulatory Visit: Payer: Self-pay

## 2023-01-28 ENCOUNTER — Ambulatory Visit: Payer: Medicare HMO | Admitting: Family Medicine

## 2023-01-28 ENCOUNTER — Encounter: Payer: Self-pay | Admitting: Family Medicine

## 2023-01-28 VITALS — BP 131/78 | HR 65 | Temp 98.3°F | Ht 63.0 in | Wt 135.6 lb

## 2023-01-28 DIAGNOSIS — Z8673 Personal history of transient ischemic attack (TIA), and cerebral infarction without residual deficits: Secondary | ICD-10-CM | POA: Diagnosis not present

## 2023-01-28 DIAGNOSIS — K810 Acute cholecystitis: Secondary | ICD-10-CM

## 2023-01-28 DIAGNOSIS — I152 Hypertension secondary to endocrine disorders: Secondary | ICD-10-CM

## 2023-01-28 DIAGNOSIS — R197 Diarrhea, unspecified: Secondary | ICD-10-CM

## 2023-01-28 DIAGNOSIS — E43 Unspecified severe protein-calorie malnutrition: Secondary | ICD-10-CM | POA: Diagnosis not present

## 2023-01-28 DIAGNOSIS — I81 Portal vein thrombosis: Secondary | ICD-10-CM

## 2023-01-28 DIAGNOSIS — Z7902 Long term (current) use of antithrombotics/antiplatelets: Secondary | ICD-10-CM | POA: Diagnosis not present

## 2023-01-28 DIAGNOSIS — E1165 Type 2 diabetes mellitus with hyperglycemia: Secondary | ICD-10-CM | POA: Diagnosis not present

## 2023-01-28 DIAGNOSIS — C220 Liver cell carcinoma: Secondary | ICD-10-CM | POA: Diagnosis not present

## 2023-01-28 DIAGNOSIS — Z7984 Long term (current) use of oral hypoglycemic drugs: Secondary | ICD-10-CM

## 2023-01-28 DIAGNOSIS — E1159 Type 2 diabetes mellitus with other circulatory complications: Secondary | ICD-10-CM | POA: Diagnosis not present

## 2023-01-28 LAB — BAYER DCA HB A1C WAIVED: HB A1C (BAYER DCA - WAIVED): 5.3 % (ref 4.8–5.6)

## 2023-01-28 MED ORDER — CHOLESTYRAMINE 4 G PO PACK: 2.0000 g | PACK | Freq: Two times a day (BID) | ORAL | 12 refills | Status: DC

## 2023-01-28 NOTE — Patient Instructions (Signed)
Visit Information  Thank you for taking time to visit with me today. Please don't hesitate to contact me if I can be of assistance to you before our next scheduled telephone appointment.  Following are the goals we discussed today:   Goals Addressed             This Visit's Progress    Transition of Care/ pt will have no readmissions within 30 days       Current Barriers:  Provider appointments per daughter she nor pt knows name of oncologist or phone number for pt follow up.  01/28/2023  Reports that she has seen oncologist and that they have a plan in place.  Home Health services [facility] has not contacted pt to schedule home visit. 01/28/2023  PT is coming 2 times a week.  Chronic Disease Management support and education needs related to Liver carcinoma/ cholecystitis  Spoke with patient who prefers RN Care Manager speak with her daughter Anthoney Harada, per daughter pt is feeling much better, nausea is under control, appetite is somewhat better, pt is drinking protein powder mixed with almond milk and seems to like this (once per day), pt had porta cath placed and to start immunotherapy treatment today,  saw oncologist Dr. Arlana Pouch on 01/13/23. 01/28/2023 Daughter interested in megace as suggested  by oncologist. Daughter states some diarrhea after treatment.  Daughter is trying all kinds of things to try to help with her mothers nutritional status.   RNCM Clinical Goal(s):  Patient will work with the Care Management team over the next 30 days to address Transition of Care Barriers: Provider appointments Home Health services verbalize understanding of plan for management of Liver carcinoma/ cholecystitis as evidenced by pt/ cg report, review of EMR and  through collaboration with RN Care manager, provider, and care team.   Interventions: Evaluation of current treatment plan related to  self management and patient's adherence to plan as established by provider  Liver carcinoma/  cholecystits  (Status:  Goal on track:  Yes.)  Short Term Goal Evaluation of current treatment plan related to  Liver carcinoma/ cholecystitis ,  home health, oncology follow up  self-management and patient's adherence to plan as established by provider. Evaluation of current treatment plan related to liver carcinoma, cholecystitis and patient's adherence to plan as established by provider Reviewed medications with patient and discussed importance of taking as prescribed Discussed plans with patient for ongoing care management follow up and provided patient with direct contact information for care management team Reviewed upcoming scheduled appointments including primary care provider 1/31 @ 10 am Encouraged to continue working with home health and completing prescribed exercises Reviewed option for patient to eat crushed ice to help with moisture in mouth. Reviewed importance of increased proteins and liquids per dietician Encouraged daughter to discuss options of Megace with PCP today.   Patient Goals/Self-Care Activities: Participate in Transition of Care Program/Attend [facility] scheduled calls Notify RN Care Manager of TOC call rescheduling needs Take all medications as prescribed Attend all scheduled provider appointments Call pharmacy for medication refills 3-7 days in advance of running out of medications Call provider office for new concerns or questions  Please follow up with primary care provider on 01/28/23 @ 230 pm Continue to work with [facility]  [phone]  Follow Up Plan:  Telephone follow up appointment with care management team member scheduled for:  02/04/23 @ 10 am The patient has been provided with contact information for the care management team  and has been advised to call with any health related questions or concerns.           Our next appointment is by telephone on 02/04/2023 at 10  Please call the care guide team at [phone] if you need to cancel or  reschedule your appointment.   If you are experiencing a Mental Health or Behavioral Health Crisis or need someone to talk to, please call 911   Patient verbalizes understanding of instructions and care plan provided today and agrees to view in MyChart. Active MyChart status and patient understanding of how to access instructions and care plan via MyChart confirmed with patient.     Lonia Chimera, RN, BSN, CEN [employer]- Transition of Care Team.  [facility] [phone]

## 2023-01-28 NOTE — Progress Notes (Unsigned)
Established Patient Office Visit  Subjective   Patient ID: Heather Oconnor, female    DOB: October 03, 1940  Age: 83 y.o. MRN: [id number]  Chief Complaint  Patient presents with   Hospitalization Follow-up    HPI Heather Oconnor is here for a hospital follow up. She was discharged from [facility] on 01/06/23 with primary diagnosis of cholecystitis. She is here with her daughter and granddaughter today. Reports doing "alright".  Per hospital discharge summary:  "Brief Hospital Course:  Heather Oconnor is a 83 y.o.female with a history of resistant HTN, CVA, CAD, T2DM, GERD, IBS who was admitted to the [facility] Medicine Teaching Service at [facility] for abdominal pain. Her hospital course is detailed below:   Choledocholithiasis/cholelithiasis Initially presented with diffuse tenderness to palpation of abdomen and RUQ US showed evidence of cholecystitis.  Surgery and GI were both consulted and patient proceeded with MRCP which showed choledocholithiasis, but no evidence that patient was obstructed, and as such did not require emergent surgical intervention.  Ultimately thought to be an incidental finding and her symptoms more likely attributable to tumor burden.   Liver lesions  HCC  malignant portal vein thrombosis Incidental finding of multifocal hepatic masses in the left liver lobe on RUQ [place], thought to be most consistent with [facility] per radiology.  AFP level was pending at discharge.  Oncology was consulted and agreed that patient would be better served in the outpatient setting - no further inpatient workup indicated. Patient was transitioned from IV heparin to Eliquis for portal vein thrombosis.   Weight loss  severe malnutrition Family concerned about weight loss and significantly decreased appetite in recent weeks.  Nutrition was consulted during admission and advised nutritional supplement drinks and MVI.  Refeeding syndrome labs were collected twice daily without evidence of refeeding syndrome.   Other  chronic conditions were medically managed with home medications and formulary alternatives as necessary (T2DM, HTN, HLD, GERD)     PCP Follow-up Recommendations: D/c ASA/Plavix now that she is on Eliquis, as puts at increased risk for ICH Continue Eliquis Be sure Patient to follow up w/ Oncology Patient to follow up with GI if desired for choledocholithiasis"   She has been following up with oncology and has started some treatments. Reprots decreased abdominal pain since discharge. She does continue to have low energy and decreased appetite. She has been drinking some protein shakes with almond milk. She really dislikes taking medication now. She would like stop her metformin if possible today. She is using a walker for ambulation and has started home PT.   She has been struggling with diarrhea with urgency. She has been taking imodium over the counter without relief. Would like to try something else.     ROS As per HPI.    Objective:     BP 131/78   Pulse 65   Temp 98.3 F (36.8 C) (Temporal)   Ht 5\' 3"  (1.6 m)   Wt 135 lb 9.6 oz (61.5 kg)   SpO2 96%   BMI 24.02 kg/m  Wt Readings from Last 3 Encounters:  01/28/23 135 lb 9.6 oz (61.5 kg)  01/21/23 130 lb 11.2 oz (59.3 kg)  01/19/23 131 lb 2.8 oz (59.5 kg)      Physical Exam Vitals and nursing note reviewed.  Constitutional:      General: She is not in acute distress.    Appearance: She is not ill-appearing, toxic-appearing or diaphoretic.  HENT:     Right Ear: Tympanic membrane and ear canal  normal.     Mouth/Throat:     Pharynx: Oropharynx is clear.  Eyes:     General: No scleral icterus. Cardiovascular:     Rate and Rhythm: Normal rate and regular rhythm.     Heart sounds: Normal heart sounds. No murmur heard. Pulmonary:     Effort: Pulmonary effort is normal. No respiratory distress.     Breath sounds: Normal breath sounds. No wheezing, rhonchi or rales.  Abdominal:     General: Bowel sounds are normal. There  is no distension.     Palpations: Abdomen is soft.     Tenderness: There is no abdominal tenderness. There is no guarding or rebound.  Musculoskeletal:     Right lower leg: No edema.     Left lower leg: No edema.  Skin:    General: Skin is warm and dry.     Coloration: Skin is not jaundiced.  Neurological:     Mental Status: She is alert and oriented to person, place, and time.     Motor: Weakness (generalized) present.     Gait: Gait abnormal (using walker).  Psychiatric:        Attention and Perception: Attention normal.        Mood and Affect: Mood normal.        Speech: Speech normal.        Behavior: Behavior normal.        Thought Content: Thought content normal.        Cognition and Memory: Cognition normal.      No results found for any visits on 01/28/23.    The ASCVD Risk score (Arnett DK, et al., 2019) failed to calculate for the following reasons:   The 2019 ASCVD risk score is only valid for ages 23 to 34    Assessment & Plan:   Heather Oconnor was seen today for hospitalization follow-up.  Diagnoses and all orders for this visit:  Acute cholecystitis Diarrhea, unspecified type No pain, nausea, or vomiting. Will try questran as below for diarrhea. Declined GI referral. Reviewed hospital discharge summary and last labs with oncology.  -     cholestyramine (QUESTRAN) 4 g packet; Take 0.5-1 packets (2-4 g total) by mouth 2 (two) times daily.  Hepatocellular carcinoma (HCC) Managed by oncology.   Protein-calorie malnutrition, severe Continue protein power supplements.   Hypertension associated with diabetes (HCC) Well controlled on current regimen.   Portal vein thrombosis Antiplatelet or antithrombotic long-term use Started on eliquis.   History of CVA in adulthood Stopped plavix due to eliquis use.   Type 2 diabetes mellitus with hyperglycemia, without long-term current use of insulin (HCC) A1c is normal today. Will discontinue metformin. Notify for  persistently elevated readings.  -     Bayer DCA Hb A1c Waived   Return in about 6 months (around 07/28/2023) for chronic follow up.   The patient indicates understanding of these issues and agrees with the plan.  Gabriel Earing, FNP

## 2023-01-28 NOTE — Patient Outreach (Signed)
Care Management  Transitions of Care Program Transitions of Care Post-discharge week 4   01/28/2023 Name: Heather Oconnor MRN: [id number] DOB: Nov 07, 1940  Subjective: Heather Oconnor is a 83 y.o. year old female who is a primary care patient of Gabriel Earing, FNP. The Care Management team Engaged with patient Engaged with patient by telephone to assess and address transitions of care needs.   Consent to Services:  Patient was given information about care management services, agreed to services, and gave verbal consent to participate.   Assessment:   Patient reports that she continues to have dry mouth with a gritty sensation. Reports she has PCP follow up today. She is unaware if she has lost any additional weight.  Reports that her daughter assist with all her needs.         SDOH Interventions    Flowsheet Row Telephone from 01/07/2023 in [place] [facility] Clinical Support from 12/07/2022 in [facility] Office Visit from 01/06/2022 in [facility] Clinical Support from 12/02/2021 in [facility] Visit from 09/01/2021 in [facility] Office Visit from 03/03/2021 in [facility]  SDOH Interventions        Food Insecurity Interventions Intervention Not Indicated Intervention Not Indicated -- Intervention Not Indicated -- --  Housing Interventions Intervention Not Indicated Intervention Not Indicated -- Intervention Not Indicated -- --  Transportation Interventions Intervention Not Indicated Intervention Not Indicated -- -- -- --  Utilities Interventions Intervention Not Indicated Intervention Not Indicated -- -- -- --  Alcohol Usage Interventions -- Intervention Not Indicated (Score <7) -- -- -- --  Depression Interventions/Treatment  -- -- PHQ2-9 Score <4 Follow-up Not Indicated -- PHQ2-9  Score <4 Follow-up Not Indicated PHQ2-9 Score <4 Follow-up Not Indicated  Financial Strain Interventions -- Intervention Not Indicated -- Intervention Not Indicated -- --  Physical Activity Interventions -- Intervention Not Indicated -- Intervention Not Indicated -- --  Stress Interventions -- Intervention Not Indicated -- Intervention Not Indicated -- --  Social Connections Interventions -- Intervention Not Indicated -- Intervention Not Indicated -- --  Health Literacy Interventions -- Intervention Not Indicated -- -- -- --        Goals Addressed             This Visit's Progress    Transition of Care/ pt will have no readmissions within 30 days       Current Barriers:  Provider appointments per daughter she nor pt knows name of oncologist or phone number for pt follow up.  01/28/2023  Reports that she has seen oncologist and that they have a plan in place.  Home Health services [facility] has not contacted pt to schedule home visit. 01/28/2023  PT is coming 2 times a week.  Chronic Disease Management support and education needs related to Liver carcinoma/ cholecystitis  Spoke with patient who prefers RN Care Manager speak with her daughter Heather Oconnor, per daughter pt is feeling much better, nausea is under control, appetite is somewhat better, pt is drinking protein powder mixed with almond milk and seems to like this (once per day), pt had porta cath placed and to start immunotherapy treatment today,  saw oncologist Dr. Arlana Pouch on 01/13/23. 01/28/2023 Daughter interested in megace as suggested  by oncologist. Daughter states some diarrhea after treatment.  Daughter is trying all kinds of things to try to help with  her mothers nutritional status.   RNCM Clinical Goal(s):  Patient will work with the Care Management team over the next 30 days to address Transition of Care Barriers: Provider appointments Home Health services verbalize understanding of plan for management of Liver  carcinoma/ cholecystitis as evidenced by pt/ cg report, review of EMR and  through collaboration with RN Care manager, provider, and care team.   Interventions: Evaluation of current treatment plan related to  self management and patient's adherence to plan as established by provider  Liver carcinoma/ cholecystits  (Status:  Goal on track:  Yes.)  Short Term Goal Evaluation of current treatment plan related to  Liver carcinoma/ cholecystitis ,  home health, oncology follow up  self-management and patient's adherence to plan as established by provider. Evaluation of current treatment plan related to liver carcinoma, cholecystitis and patient's adherence to plan as established by provider Reviewed medications with patient and discussed importance of taking as prescribed Discussed plans with patient for ongoing care management follow up and provided patient with direct contact information for care management team Reviewed upcoming scheduled appointments including primary care provider 1/31 @ 10 am Encouraged to continue working with home health and completing prescribed exercises Reviewed option for patient to eat crushed ice to help with moisture in mouth. Reviewed importance of increased proteins and liquids per dietician Encouraged daughter to discuss options of Megace with PCP today.   Patient Goals/Self-Care Activities: Participate in Transition of Care Program/Attend [facility] scheduled calls Notify RN Care Manager of TOC call rescheduling needs Take all medications as prescribed Attend all scheduled provider appointments Call pharmacy for medication refills 3-7 days in advance of running out of medications Call provider office for new concerns or questions  Please follow up with primary care provider on 01/28/23 @ 230 pm Continue to work with [facility]  [phone]  Follow Up Plan:  Telephone follow up appointment with care management team member scheduled for:  02/04/23 @ 10 am The  patient has been provided with contact information for the care management team and has been advised to call with any health related questions or concerns.          Plan: Telephone follow up appointment with care management team member scheduled for: 02/04/2023  Lonia Chimera, RN, BSN, CEN Population Health- Transition of Care Team.  [facility] [phone]

## 2023-01-29 ENCOUNTER — Other Ambulatory Visit (HOSPITAL_BASED_OUTPATIENT_CLINIC_OR_DEPARTMENT_OTHER): Payer: Self-pay | Admitting: Cardiovascular Disease

## 2023-01-31 ENCOUNTER — Encounter: Payer: Self-pay | Admitting: Family Medicine

## 2023-01-31 DIAGNOSIS — I2510 Atherosclerotic heart disease of native coronary artery without angina pectoris: Secondary | ICD-10-CM | POA: Diagnosis not present

## 2023-01-31 DIAGNOSIS — I130 Hypertensive heart and chronic kidney disease with heart failure and stage 1 through stage 4 chronic kidney disease, or unspecified chronic kidney disease: Secondary | ICD-10-CM | POA: Diagnosis not present

## 2023-01-31 DIAGNOSIS — E43 Unspecified severe protein-calorie malnutrition: Secondary | ICD-10-CM | POA: Diagnosis not present

## 2023-01-31 DIAGNOSIS — E1122 Type 2 diabetes mellitus with diabetic chronic kidney disease: Secondary | ICD-10-CM | POA: Diagnosis not present

## 2023-01-31 DIAGNOSIS — C220 Liver cell carcinoma: Secondary | ICD-10-CM | POA: Diagnosis not present

## 2023-01-31 DIAGNOSIS — I5032 Chronic diastolic (congestive) heart failure: Secondary | ICD-10-CM | POA: Diagnosis not present

## 2023-01-31 DIAGNOSIS — I81 Portal vein thrombosis: Secondary | ICD-10-CM | POA: Diagnosis not present

## 2023-01-31 DIAGNOSIS — N1832 Chronic kidney disease, stage 3b: Secondary | ICD-10-CM | POA: Diagnosis not present

## 2023-01-31 DIAGNOSIS — K8010 Calculus of gallbladder with chronic cholecystitis without obstruction: Secondary | ICD-10-CM | POA: Diagnosis not present

## 2023-02-03 DIAGNOSIS — I130 Hypertensive heart and chronic kidney disease with heart failure and stage 1 through stage 4 chronic kidney disease, or unspecified chronic kidney disease: Secondary | ICD-10-CM | POA: Diagnosis not present

## 2023-02-03 DIAGNOSIS — I81 Portal vein thrombosis: Secondary | ICD-10-CM | POA: Diagnosis not present

## 2023-02-03 DIAGNOSIS — K8010 Calculus of gallbladder with chronic cholecystitis without obstruction: Secondary | ICD-10-CM | POA: Diagnosis not present

## 2023-02-03 DIAGNOSIS — I5032 Chronic diastolic (congestive) heart failure: Secondary | ICD-10-CM | POA: Diagnosis not present

## 2023-02-03 DIAGNOSIS — I2510 Atherosclerotic heart disease of native coronary artery without angina pectoris: Secondary | ICD-10-CM | POA: Diagnosis not present

## 2023-02-03 DIAGNOSIS — C220 Liver cell carcinoma: Secondary | ICD-10-CM | POA: Diagnosis not present

## 2023-02-03 DIAGNOSIS — E43 Unspecified severe protein-calorie malnutrition: Secondary | ICD-10-CM | POA: Diagnosis not present

## 2023-02-03 DIAGNOSIS — E1122 Type 2 diabetes mellitus with diabetic chronic kidney disease: Secondary | ICD-10-CM | POA: Diagnosis not present

## 2023-02-03 DIAGNOSIS — N1832 Chronic kidney disease, stage 3b: Secondary | ICD-10-CM | POA: Diagnosis not present

## 2023-02-04 ENCOUNTER — Encounter: Payer: Self-pay | Admitting: *Deleted

## 2023-02-04 ENCOUNTER — Encounter: Payer: Self-pay | Admitting: Nurse Practitioner

## 2023-02-04 ENCOUNTER — Other Ambulatory Visit: Payer: Self-pay | Admitting: *Deleted

## 2023-02-04 ENCOUNTER — Inpatient Hospital Stay: Payer: Medicare HMO | Attending: Oncology | Admitting: Nurse Practitioner

## 2023-02-04 DIAGNOSIS — R530 Neoplastic (malignant) related fatigue: Secondary | ICD-10-CM | POA: Diagnosis not present

## 2023-02-04 DIAGNOSIS — Z515 Encounter for palliative care: Secondary | ICD-10-CM

## 2023-02-04 DIAGNOSIS — R630 Anorexia: Secondary | ICD-10-CM | POA: Diagnosis not present

## 2023-02-04 DIAGNOSIS — Z7189 Other specified counseling: Secondary | ICD-10-CM | POA: Diagnosis not present

## 2023-02-04 DIAGNOSIS — C220 Liver cell carcinoma: Secondary | ICD-10-CM

## 2023-02-04 NOTE — Patient Outreach (Signed)
Care Management  Transitions of Care Program Transitions of Care Post-discharge week 5   02/04/2023 Name: Heather Oconnor MRN: [id number] DOB: [birth year]  Subjective: Heather Oconnor is a 83 y.o. year old female who is a primary care patient of Gabriel Earing, FNP. The Care Management team Engaged with patient Engaged with patient by telephone to assess and address transitions of care needs.   Consent to Services:  Patient was given information about care management services, agreed to services, and gave verbal consent to participate.   Assessment: Patient states she continues to work with home health and this is helpful as pt would like to be independent of walker, patient's appetite " is about the same, drinking a lot of liquids", pt declines any further follow up and states she will let her provider know if any future needs for care management.         SDOH Interventions    Flowsheet Row Telephone from 01/07/2023 in [place] [facility] Clinical Support from 12/07/2022 in [facility] Office Visit from 01/06/2022 in [facility] Clinical Support from 12/02/2021 in [facility] Office Visit from 09/01/2021 in [facility] Office Visit from 03/03/2021 in [facility]  SDOH Interventions        Food Insecurity Interventions Intervention Not Indicated Intervention Not Indicated -- Intervention Not Indicated -- --  Housing Interventions Intervention Not Indicated Intervention Not Indicated -- Intervention Not Indicated -- --  Transportation Interventions Intervention Not Indicated Intervention Not Indicated -- -- -- --  Utilities Interventions Intervention Not Indicated Intervention Not Indicated -- -- -- --  Alcohol Usage Interventions -- Intervention Not Indicated (Score <7) -- -- -- --  Depression  Interventions/Treatment  -- -- PHQ2-9 Score <4 Follow-up Not Indicated -- PHQ2-9 Score <4 Follow-up Not Indicated PHQ2-9 Score <4 Follow-up Not Indicated  Financial Strain Interventions -- Intervention Not Indicated -- Intervention Not Indicated -- --  Physical Activity Interventions -- Intervention Not Indicated -- Intervention Not Indicated -- --  Stress Interventions -- Intervention Not Indicated -- Intervention Not Indicated -- --  Social Connections Interventions -- Intervention Not Indicated -- Intervention Not Indicated -- --  Health Literacy Interventions -- Intervention Not Indicated -- -- -- --        Goals Addressed             This Visit's Progress    COMPLETED: Transition of Care/ pt will have no readmissions within 30 days       Current Barriers:  Provider appointments per daughter she nor pt knows name of oncologist or phone number for pt follow up.  01/28/2023  Reports that she has seen oncologist and that they have a plan in place.  Home Health services [facility] has not contacted pt to schedule home visit. 01/28/2023  PT is coming 2 times a week.  Chronic Disease Management support and education needs related to Liver carcinoma/ cholecystitis  Spoke with patient who prefers RN Care Manager speak with her daughter Anthoney Harada, per daughter pt is feeling much better, nausea is under control, appetite is somewhat better, pt is drinking protein powder mixed with almond milk and seems to like this (once per day), pt had porta cath placed and to start immunotherapy treatment today,  saw oncologist Dr. Arlana Pouch on 01/13/23. 01/28/2023 Daughter interested in megace as suggested  by oncologist. Daughter states some diarrhea after  treatment.  Daughter is trying all kinds of things to try to help with her mothers nutritional status.  02/04/23- update per pt, her appetite is "about the same, mostly want to drink liquids"  pt states she is staying well hydrated, continues working  with home health and " would like to get rid of this walker hopefully I can", pt states she is working on nutritional status and trying to be mindful of intake. Patient declines any further needs for care management citing she will continue to work with home health and " if anything comes up, I'll let my doctor know"  RNCM Clinical Goal(s):  Patient will work with the Care Management team over the next 30 days to address Transition of Care Barriers: Provider appointments Home Health services verbalize understanding of plan for management of Liver carcinoma/ cholecystitis as evidenced by pt/ cg report, review of EMR and  through collaboration with RN Care manager, provider, and care team.   Interventions: Evaluation of current treatment plan related to  self management and patient's adherence to plan as established by provider  Liver carcinoma/ cholecystits  (Status:  Goal on track:  Yes.)  Short Term Goal Evaluation of current treatment plan related to  Liver carcinoma/ cholecystitis ,  home health, oncology follow up  self-management and patient's adherence to plan as established by provider. Evaluation of current treatment plan related to liver carcinoma, cholecystitis and patient's adherence to plan as established by provider Reviewed medications with patient and discussed importance of taking as prescribed Discussed plans with patient for ongoing care management follow up and provided patient with direct contact information for care management team Reviewed upcoming scheduled appointments Encouraged to continue working with home health and completing prescribed exercises Reviewed option for patient to eat crushed ice to help with moisture in mouth. Reinforced importance of increased proteins and liquids per dietician Encouraged small frequent meals and continue protein shakes Reviewed plan of care with pt including case closure, pt states if she has any further care management needs in the  future she will let her provider know   Patient Goals/Self-Care Activities: Participate in Transition of Care Program/Attend [facility] scheduled calls Notify RN Care Manager of TOC call rescheduling needs Take all medications as prescribed Attend all scheduled provider appointments Call pharmacy for medication refills 3-7 days in advance of running out of medications Call provider office for new concerns or questions  Please follow up with primary care provider as needed Continue to work with [facility]  [phone] Case closure- please let your doctor know if you have any future needs for care management  Follow Up Plan:  The patient has been provided with contact information for the care management team and has been advised to call with any health related questions or concerns.  No further follow up required: case closure          Plan: The patient has been provided with contact information for the care management team and has been advised to call with any health related questions or concerns.   Case closure.  Irving Shows [facility], BSN RN Care Manager/ Transition of Care [place]/ [facility] [phone]

## 2023-02-09 ENCOUNTER — Encounter: Payer: Self-pay | Admitting: Oncology

## 2023-02-10 ENCOUNTER — Inpatient Hospital Stay: Payer: Medicare HMO | Attending: Oncology | Admitting: Dietician

## 2023-02-10 ENCOUNTER — Other Ambulatory Visit: Payer: Self-pay | Admitting: Oncology

## 2023-02-10 ENCOUNTER — Other Ambulatory Visit: Payer: Self-pay

## 2023-02-10 ENCOUNTER — Telehealth: Payer: Self-pay

## 2023-02-10 DIAGNOSIS — C220 Liver cell carcinoma: Secondary | ICD-10-CM | POA: Insufficient documentation

## 2023-02-10 DIAGNOSIS — N1832 Chronic kidney disease, stage 3b: Secondary | ICD-10-CM | POA: Diagnosis not present

## 2023-02-10 DIAGNOSIS — E1122 Type 2 diabetes mellitus with diabetic chronic kidney disease: Secondary | ICD-10-CM | POA: Diagnosis not present

## 2023-02-10 DIAGNOSIS — N179 Acute kidney failure, unspecified: Secondary | ICD-10-CM | POA: Insufficient documentation

## 2023-02-10 DIAGNOSIS — I130 Hypertensive heart and chronic kidney disease with heart failure and stage 1 through stage 4 chronic kidney disease, or unspecified chronic kidney disease: Secondary | ICD-10-CM | POA: Diagnosis not present

## 2023-02-10 DIAGNOSIS — R630 Anorexia: Secondary | ICD-10-CM

## 2023-02-10 DIAGNOSIS — I2510 Atherosclerotic heart disease of native coronary artery without angina pectoris: Secondary | ICD-10-CM | POA: Diagnosis not present

## 2023-02-10 DIAGNOSIS — I5032 Chronic diastolic (congestive) heart failure: Secondary | ICD-10-CM | POA: Diagnosis not present

## 2023-02-10 DIAGNOSIS — Z79899 Other long term (current) drug therapy: Secondary | ICD-10-CM | POA: Insufficient documentation

## 2023-02-10 DIAGNOSIS — I81 Portal vein thrombosis: Secondary | ICD-10-CM | POA: Diagnosis not present

## 2023-02-10 DIAGNOSIS — I959 Hypotension, unspecified: Secondary | ICD-10-CM | POA: Insufficient documentation

## 2023-02-10 DIAGNOSIS — K8010 Calculus of gallbladder with chronic cholecystitis without obstruction: Secondary | ICD-10-CM | POA: Diagnosis not present

## 2023-02-10 DIAGNOSIS — E43 Unspecified severe protein-calorie malnutrition: Secondary | ICD-10-CM | POA: Diagnosis not present

## 2023-02-10 DIAGNOSIS — Z5112 Encounter for antineoplastic immunotherapy: Secondary | ICD-10-CM | POA: Insufficient documentation

## 2023-02-10 MED ORDER — MEGESTROL ACETATE 625 MG/5ML PO SUSP: 625.0000 mg | Freq: Every day | ORAL | 2 refills | Status: DC

## 2023-02-10 NOTE — Telephone Encounter (Signed)
 Daughter called and spoke of concern for her mom's (patient) abdomen having sudden increase in size. Updated daughter via phone to go ahead and call Centralized Scheduling to make appointment for US  Ascites. Daughter agreed to call. Order already placed with [facility] day of tomorrow. Daughter aware.

## 2023-02-10 NOTE — Progress Notes (Signed)
 Patient's daughter called and stated that the patient's abdomen is more swollen compared to prior.  I spoke to the patient and her daughter.  Clinical picture concerning for ascites.  Will obtain an ultrasound for further evaluation.  Also patient is now willing to try Megace  to help improve her appetite.  Prescription sent to her pharmacy.

## 2023-02-10 NOTE — Progress Notes (Signed)
 Nutrition Follow Up: Reached ut to patient at home telephone # for remote follow up on PO intake.  Patient reports severe abdominal pain past 4-34- days and she hasn't been eating anything.  She can't tell if pain would increase when she eats, also reports not drinking much.  She states it is a constant pain and it moves around a lot top of abdomin and lower area.  Bowels are moving last bowel movement was yesterday. She states she's only drinking a little, juices, and buttermilk.  Couldn't get her to quantify amount just kept saying not enough.    Her home PT just took her blood pressure which was 112 over 56, pulse 60.  She reports this is usual range.    I called patients daughter after speaking with patient.  She reports she has tried to contact cancer center regarding the pain.  She asked about safety of patient drinking buttermilk.  She admits she hasn't been pressuring patient to eat. She states patient hadn't wanted the protein smoothies she usually makes (almond milk, bananas or strawberries and protein powder.)     Anthropometrics:   Height: 63 Weight:  01/28/23  135.6# 01/21/23  130.7# UBW: 155 (not since 07/2022) BMI: 24.02    NUTRITION DIAGNOSIS: Inadequate PO intake to meet increased nutrient needs, r/t xerostomia and  cancer diagnosis. SABRAcontinues  MALNUTRITION DIAGNOSIS: Per IP RD. Severe Malnutrition related to chronic illness as evidenced by severe muscle depletion, severe fat depletion, percent weight loss, energy intake < or equal to 75% for > or equal to 1 month (9% weight loss in 3 months).  Continues with inadequate PO r/t recent increase in pain.   INTERVENTION:   Encouraged adequate hydration and use of combination of Boost Breeze, buttermilk, and trial of soft foods with protein powder mixed in.  Monitor response to increases in pain with eating.   Expressed concern with dehydration if she isn't drinking. Will reach out to MD to  have cancer center follow up  on current pain.  MONITORING, EVALUATION, GOAL: weight, PO intake, Nutrition Impact Symptoms, labs Goal is weight maintenance  Next Visit: Remote next week.  Micheline Craven, RDN, LDN Registered Dietitian, [facility] Part Time Remote (Usual office hours: Tuesday-Thursday) Cell: [phone]

## 2023-02-11 ENCOUNTER — Other Ambulatory Visit: Payer: Self-pay | Admitting: Oncology

## 2023-02-11 ENCOUNTER — Other Ambulatory Visit: Payer: Self-pay

## 2023-02-11 ENCOUNTER — Ambulatory Visit (HOSPITAL_COMMUNITY)
Admission: RE | Admit: 2023-02-11 | Discharge: 2023-02-11 | Disposition: A | Discharge status: Home / Self Care | Payer: Medicare HMO | Source: Ambulatory Visit | Attending: Oncology

## 2023-02-11 DIAGNOSIS — R180 Malignant ascites: Secondary | ICD-10-CM

## 2023-02-11 DIAGNOSIS — C220 Liver cell carcinoma: Secondary | ICD-10-CM

## 2023-02-11 DIAGNOSIS — G893 Neoplasm related pain (acute) (chronic): Secondary | ICD-10-CM

## 2023-02-11 DIAGNOSIS — R1084 Generalized abdominal pain: Secondary | ICD-10-CM | POA: Diagnosis not present

## 2023-02-11 DIAGNOSIS — R188 Other ascites: Secondary | ICD-10-CM | POA: Diagnosis not present

## 2023-02-11 MED ORDER — TRAMADOL HCL 50 MG PO TABS: 50.0000 mg | ORAL_TABLET | Freq: Four times a day (QID) | ORAL | 0 refills | Status: DC | PRN

## 2023-02-12 ENCOUNTER — Encounter (HOSPITAL_COMMUNITY): Payer: Self-pay

## 2023-02-12 ENCOUNTER — Other Ambulatory Visit: Payer: Self-pay

## 2023-02-12 ENCOUNTER — Emergency Department (HOSPITAL_COMMUNITY): Payer: Medicare HMO

## 2023-02-12 ENCOUNTER — Inpatient Hospital Stay (HOSPITAL_COMMUNITY)
Admission: EM | Admit: 2023-02-12 | Discharge: 2023-02-15 | DRG: 435 | Disposition: A | Discharge status: Home / Self Care | Payer: Medicare HMO | Attending: Internal Medicine | Admitting: Internal Medicine

## 2023-02-12 DIAGNOSIS — H5461 Unqualified visual loss, right eye, normal vision left eye: Secondary | ICD-10-CM | POA: Diagnosis present

## 2023-02-12 DIAGNOSIS — E1165 Type 2 diabetes mellitus with hyperglycemia: Secondary | ICD-10-CM

## 2023-02-12 DIAGNOSIS — Z1152 Encounter for screening for COVID-19: Secondary | ICD-10-CM

## 2023-02-12 DIAGNOSIS — R112 Nausea with vomiting, unspecified: Secondary | ICD-10-CM | POA: Diagnosis not present

## 2023-02-12 DIAGNOSIS — Z8249 Family history of ischemic heart disease and other diseases of the circulatory system: Secondary | ICD-10-CM

## 2023-02-12 DIAGNOSIS — R1084 Generalized abdominal pain: Secondary | ICD-10-CM | POA: Diagnosis present

## 2023-02-12 DIAGNOSIS — Z7901 Long term (current) use of anticoagulants: Secondary | ICD-10-CM

## 2023-02-12 DIAGNOSIS — R188 Other ascites: Secondary | ICD-10-CM | POA: Diagnosis not present

## 2023-02-12 DIAGNOSIS — N179 Acute kidney failure, unspecified: Secondary | ICD-10-CM | POA: Diagnosis present

## 2023-02-12 DIAGNOSIS — I81 Portal vein thrombosis: Secondary | ICD-10-CM | POA: Diagnosis present

## 2023-02-12 DIAGNOSIS — Z452 Encounter for adjustment and management of vascular access device: Secondary | ICD-10-CM | POA: Diagnosis not present

## 2023-02-12 DIAGNOSIS — R54 Age-related physical debility: Secondary | ICD-10-CM | POA: Diagnosis present

## 2023-02-12 DIAGNOSIS — I110 Hypertensive heart disease with heart failure: Secondary | ICD-10-CM | POA: Diagnosis present

## 2023-02-12 DIAGNOSIS — I152 Hypertension secondary to endocrine disorders: Secondary | ICD-10-CM | POA: Diagnosis present

## 2023-02-12 DIAGNOSIS — E11649 Type 2 diabetes mellitus with hypoglycemia without coma: Secondary | ICD-10-CM | POA: Diagnosis present

## 2023-02-12 DIAGNOSIS — K580 Irritable bowel syndrome with diarrhea: Secondary | ICD-10-CM | POA: Diagnosis present

## 2023-02-12 DIAGNOSIS — I3139 Other pericardial effusion (noninflammatory): Secondary | ICD-10-CM | POA: Diagnosis present

## 2023-02-12 DIAGNOSIS — R109 Unspecified abdominal pain: Secondary | ICD-10-CM | POA: Diagnosis present

## 2023-02-12 DIAGNOSIS — E1159 Type 2 diabetes mellitus with other circulatory complications: Secondary | ICD-10-CM | POA: Diagnosis present

## 2023-02-12 DIAGNOSIS — D72829 Elevated white blood cell count, unspecified: Secondary | ICD-10-CM | POA: Diagnosis present

## 2023-02-12 DIAGNOSIS — J90 Pleural effusion, not elsewhere classified: Secondary | ICD-10-CM | POA: Diagnosis not present

## 2023-02-12 DIAGNOSIS — Z8673 Personal history of transient ischemic attack (TIA), and cerebral infarction without residual deficits: Secondary | ICD-10-CM

## 2023-02-12 DIAGNOSIS — C220 Liver cell carcinoma: Principal | ICD-10-CM

## 2023-02-12 DIAGNOSIS — Z833 Family history of diabetes mellitus: Secondary | ICD-10-CM | POA: Diagnosis not present

## 2023-02-12 DIAGNOSIS — R180 Malignant ascites: Secondary | ICD-10-CM

## 2023-02-12 DIAGNOSIS — E8809 Other disorders of plasma-protein metabolism, not elsewhere classified: Secondary | ICD-10-CM

## 2023-02-12 DIAGNOSIS — Z87891 Personal history of nicotine dependence: Secondary | ICD-10-CM

## 2023-02-12 DIAGNOSIS — K219 Gastro-esophageal reflux disease without esophagitis: Secondary | ICD-10-CM | POA: Diagnosis present

## 2023-02-12 DIAGNOSIS — I5032 Chronic diastolic (congestive) heart failure: Secondary | ICD-10-CM | POA: Diagnosis present

## 2023-02-12 DIAGNOSIS — C799 Secondary malignant neoplasm of unspecified site: Secondary | ICD-10-CM

## 2023-02-12 DIAGNOSIS — Z888 Allergy status to other drugs, medicaments and biological substances status: Secondary | ICD-10-CM

## 2023-02-12 DIAGNOSIS — Z79899 Other long term (current) drug therapy: Secondary | ICD-10-CM

## 2023-02-12 DIAGNOSIS — E860 Dehydration: Secondary | ICD-10-CM | POA: Diagnosis present

## 2023-02-12 DIAGNOSIS — C7801 Secondary malignant neoplasm of right lung: Secondary | ICD-10-CM | POA: Diagnosis present

## 2023-02-12 DIAGNOSIS — K7460 Unspecified cirrhosis of liver: Secondary | ICD-10-CM | POA: Diagnosis present

## 2023-02-12 DIAGNOSIS — C229 Malignant neoplasm of liver, not specified as primary or secondary: Secondary | ICD-10-CM | POA: Diagnosis not present

## 2023-02-12 DIAGNOSIS — R197 Diarrhea, unspecified: Secondary | ICD-10-CM

## 2023-02-12 DIAGNOSIS — I10 Essential (primary) hypertension: Secondary | ICD-10-CM

## 2023-02-12 DIAGNOSIS — E8721 Acute metabolic acidosis: Secondary | ICD-10-CM | POA: Diagnosis not present

## 2023-02-12 DIAGNOSIS — Z7984 Long term (current) use of oral hypoglycemic drugs: Secondary | ICD-10-CM

## 2023-02-12 DIAGNOSIS — J9811 Atelectasis: Secondary | ICD-10-CM | POA: Diagnosis not present

## 2023-02-12 DIAGNOSIS — Z8505 Personal history of malignant neoplasm of liver: Secondary | ICD-10-CM

## 2023-02-12 DIAGNOSIS — K838 Other specified diseases of biliary tract: Secondary | ICD-10-CM | POA: Diagnosis not present

## 2023-02-12 DIAGNOSIS — R7989 Other specified abnormal findings of blood chemistry: Secondary | ICD-10-CM

## 2023-02-12 LAB — CBC WITH DIFFERENTIAL/PLATELET
Abs Immature Granulocytes: 0.1 10*3/uL — ABNORMAL HIGH (ref 0.00–0.07)
Basophils Absolute: 0.1 10*3/uL (ref 0.0–0.1)
Basophils Relative: 0 %
Eosinophils Absolute: 0 10*3/uL (ref 0.0–0.5)
Eosinophils Relative: 0 %
HCT: 44.1 % (ref 36.0–46.0)
Hemoglobin: 13.8 g/dL (ref 12.0–15.0)
Immature Granulocytes: 1 %
Lymphocytes Relative: 10 %
Lymphs Abs: 1.9 10*3/uL (ref 0.7–4.0)
MCH: 27 pg (ref 26.0–34.0)
MCHC: 31.3 g/dL (ref 30.0–36.0)
MCV: 86.3 fL (ref 80.0–100.0)
Monocytes Absolute: 1.2 10*3/uL — ABNORMAL HIGH (ref 0.1–1.0)
Monocytes Relative: 6 %
Neutro Abs: 16.8 10*3/uL — ABNORMAL HIGH (ref 1.7–7.7)
Neutrophils Relative %: 83 %
Platelets: 506 10*3/uL — ABNORMAL HIGH (ref 150–400)
RBC: 5.11 MIL/uL (ref 3.87–5.11)
RDW: 20.6 % — ABNORMAL HIGH (ref 11.5–15.5)
WBC: 20.1 10*3/uL — ABNORMAL HIGH (ref 4.0–10.5)
nRBC: 0 % (ref 0.0–0.2)

## 2023-02-12 LAB — COMPREHENSIVE METABOLIC PANEL
ALT: 20 U/L (ref 0–44)
AST: 76 U/L — ABNORMAL HIGH (ref 15–41)
Albumin: 2.5 g/dL — ABNORMAL LOW (ref 3.5–5.0)
Alkaline Phosphatase: 226 U/L — ABNORMAL HIGH (ref 38–126)
Anion gap: 14 (ref 5–15)
BUN: 31 mg/dL — ABNORMAL HIGH (ref 8–23)
CO2: 19 mmol/L — ABNORMAL LOW (ref 22–32)
Calcium: 8.5 mg/dL — ABNORMAL LOW (ref 8.9–10.3)
Chloride: 108 mmol/L (ref 98–111)
Creatinine, Ser: 1.59 mg/dL — ABNORMAL HIGH (ref 0.44–1.00)
GFR, Estimated: 32 mL/min — ABNORMAL LOW (ref >60)
Glucose, Bld: 101 mg/dL — ABNORMAL HIGH (ref 70–99)
Potassium: 5 mmol/L (ref 3.5–5.1)
Sodium: 141 mmol/L (ref 135–145)
Total Bilirubin: 1.4 mg/dL — ABNORMAL HIGH (ref 0.0–1.2)
Total Protein: 5.8 g/dL — ABNORMAL LOW (ref 6.5–8.1)

## 2023-02-12 LAB — LACTIC ACID, PLASMA: Lactic Acid, Venous: 1.9 mmol/L (ref 0.5–1.9)

## 2023-02-12 LAB — RESP PANEL BY RT-PCR (RSV, FLU A&B, COVID)  RVPGX2
Influenza A by PCR: NEGATIVE
Influenza B by PCR: NEGATIVE
Resp Syncytial Virus by PCR: NEGATIVE
SARS Coronavirus 2 by RT PCR: NEGATIVE

## 2023-02-12 LAB — PROTIME-INR
INR: 1.5 — ABNORMAL HIGH (ref 0.8–1.2)
Prothrombin Time: 18.4 s — ABNORMAL HIGH (ref 11.4–15.2)

## 2023-02-12 LAB — AMMONIA: Ammonia: 28 umol/L (ref 9–35)

## 2023-02-12 LAB — LIPASE, BLOOD: Lipase: 51 U/L (ref 11–51)

## 2023-02-12 MED ORDER — OXYCODONE-ACETAMINOPHEN 5-325 MG PO TABS
1.0000 | ORAL_TABLET | Freq: Once | ORAL | Status: AC
  Administered 2023-02-12: 1 via ORAL
  Filled 2023-02-12: qty 1

## 2023-02-12 MED ORDER — DORZOLAMIDE HCL-TIMOLOL MAL 2-0.5 % OP SOLN
1.0000 [drp] | Freq: Two times a day (BID) | OPHTHALMIC | Status: DC
Start: 2023-02-12 — End: 2023-02-15
  Administered 2023-02-12 – 2023-02-15 (×6): 1 [drp] via OPHTHALMIC
  Filled 2023-02-12: qty 10

## 2023-02-12 MED ORDER — ACETAMINOPHEN 650 MG RE SUPP: 650.0000 mg | Freq: Four times a day (QID) | RECTAL | Status: DC | PRN

## 2023-02-12 MED ORDER — SODIUM CHLORIDE 0.9 % IV SOLN
2.0000 g | INTRAVENOUS | Status: DC
  Administered 2023-02-13: 2 g via INTRAVENOUS
  Filled 2023-02-12: qty 20

## 2023-02-12 MED ORDER — ACETAMINOPHEN 325 MG PO TABS: 650.0000 mg | ORAL_TABLET | Freq: Four times a day (QID) | ORAL | Status: DC | PRN

## 2023-02-12 MED ORDER — CLONIDINE HCL 0.2 MG PO TABS
0.2000 mg | ORAL_TABLET | Freq: Two times a day (BID) | ORAL | Status: DC
  Administered 2023-02-12 – 2023-02-15 (×6): 0.2 mg via ORAL
  Filled 2023-02-12 (×3): qty 1
  Filled 2023-02-12: qty 2
  Filled 2023-02-12: qty 1
  Filled 2023-02-12 (×2): qty 2
  Filled 2023-02-12: qty 1
  Filled 2023-02-12: qty 2
  Filled 2023-02-12: qty 1
  Filled 2023-02-12 (×2): qty 2

## 2023-02-12 MED ORDER — IOHEXOL 350 MG/ML SOLN
75.0000 mL | Freq: Once | INTRAVENOUS | Status: AC | PRN
  Administered 2023-02-12: 75 mL via INTRAVENOUS

## 2023-02-12 MED ORDER — MIRTAZAPINE 15 MG PO TABS
15.0000 mg | ORAL_TABLET | Freq: Every day | ORAL | Status: DC
  Administered 2023-02-12 – 2023-02-14 (×3): 15 mg via ORAL
  Filled 2023-02-12 (×3): qty 1

## 2023-02-12 MED ORDER — HYDRALAZINE HCL 50 MG PO TABS
50.0000 mg | ORAL_TABLET | Freq: Three times a day (TID) | ORAL | Status: DC
  Administered 2023-02-12 – 2023-02-15 (×7): 50 mg via ORAL
  Filled 2023-02-12 (×7): qty 1

## 2023-02-12 MED ORDER — APIXABAN 5 MG PO TABS
5.0000 mg | ORAL_TABLET | Freq: Two times a day (BID) | ORAL | Status: DC
  Administered 2023-02-13 – 2023-02-15 (×4): 5 mg via ORAL
  Filled 2023-02-12 (×4): qty 1

## 2023-02-12 MED ORDER — SPIRONOLACTONE 25 MG PO TABS
25.0000 mg | ORAL_TABLET | Freq: Every day | ORAL | Status: DC
  Filled 2023-02-12: qty 1

## 2023-02-12 MED ORDER — MORPHINE SULFATE (PF) 2 MG/ML IV SOLN
2.0000 mg | Freq: Once | INTRAVENOUS | Status: AC
  Administered 2023-02-12: 2 mg via INTRAVENOUS
  Filled 2023-02-12: qty 1

## 2023-02-12 MED ORDER — ONDANSETRON HCL 4 MG/2ML IJ SOLN
4.0000 mg | Freq: Once | INTRAMUSCULAR | Status: AC
  Administered 2023-02-12: 4 mg via INTRAVENOUS
  Filled 2023-02-12: qty 2

## 2023-02-12 MED ORDER — SODIUM CHLORIDE 0.9% FLUSH
3.0000 mL | Freq: Two times a day (BID) | INTRAVENOUS | Status: DC
  Administered 2023-02-12 – 2023-02-14 (×5): 3 mL via INTRAVENOUS

## 2023-02-12 MED ORDER — MEGESTROL ACETATE 400 MG/10ML PO SUSP
400.0000 mg | Freq: Every day | ORAL | Status: DC
  Administered 2023-02-13 – 2023-02-15 (×3): 400 mg via ORAL
  Filled 2023-02-12 (×3): qty 10

## 2023-02-12 MED ORDER — AMLODIPINE BESYLATE 5 MG PO TABS
10.0000 mg | ORAL_TABLET | Freq: Every day | ORAL | Status: DC
  Administered 2023-02-13 – 2023-02-15 (×3): 10 mg via ORAL
  Filled 2023-02-12 (×3): qty 2

## 2023-02-12 MED ORDER — LACTATED RINGERS IV BOLUS
1000.0000 mL | Freq: Once | INTRAVENOUS | Status: AC
  Administered 2023-02-12: 1000 mL via INTRAVENOUS

## 2023-02-12 MED ORDER — MORPHINE SULFATE (PF) 2 MG/ML IV SOLN: 2.0000 mg | INTRAVENOUS | Status: DC | PRN

## 2023-02-12 MED ORDER — ONDANSETRON 4 MG PO TBDP: 4.0000 mg | ORAL_TABLET | Freq: Once | ORAL | Status: DC

## 2023-02-12 MED ORDER — MEGESTROL ACETATE 625 MG/5ML PO SUSP
625.0000 mg | Freq: Every day | ORAL | Status: DC
Start: 2023-02-12 — End: 2023-02-12

## 2023-02-12 NOTE — ED Provider Triage Note (Signed)
 Emergency Medicine Provider Triage Evaluation Note  IDAMAE COCCIA , a 83 y.o. female  was evaluated in triage.  Pt complains of N/V/D x 1 day, HA, abd pain x 3-4 days. Hx stage 4 liver CA. Scheduled for para on Tuesday   Review of Systems  Positive: HA, abd pain, N/V/D, body aches, fatigue, cough, congestion Negative: Blood in stool, hematemesis, fever, SHOB, CP  Physical Exam  BP 114/72 (BP Location: Left Arm)   Pulse 91   Resp 15   Ht 5' 3 (1.6 m)   Wt 61.2 kg   SpO2 99%   BMI 23.91 kg/m  Gen:   Awake, no distress   Resp:  Normal effort  MSK:   Moves extremities without difficulty  Other:    Medical Decision Making  Medically screening exam initiated at 10:58 AM.  Appropriate orders placed.  TAHJ LINDSETH was informed that the remainder of the evaluation will be completed by another provider, this initial triage assessment does not replace that evaluation, and the importance of remaining in the ED until their evaluation is complete.  Labs and imaging ordered   Francis Ileana LOISE DEVONNA 02/12/23 1102

## 2023-02-12 NOTE — Assessment & Plan Note (Addendum)
IV protonix  

## 2023-02-12 NOTE — ED Notes (Signed)
 Pt in CT at this time, unable to assess

## 2023-02-12 NOTE — ED Triage Notes (Addendum)
 Stage 4 liver cancer scheduled for periocentesis on tuesday but over the past 36 hrs family reports n/v/d abd pain and not eating or drinking.  Patient appears malnourished which family reports patient doesn't eat much regularly anyways. Currently getting immunotherapy treatment but no chemo.

## 2023-02-12 NOTE — ED Provider Notes (Signed)
 [place] EMERGENCY DEPARTMENT AT [facility] Provider Note   CSN: [id number] Arrival date & time: 02/12/23  1036     History  Chief Complaint  Patient presents with  . Abdominal Pain    Heather Oconnor is a 83 y.o. female.  Pt with metastatic hepatocellular cancer, with generalized weakness, nausea, poor po intake. +vomiting and diarrhea, non bloody. No fevers.  Symptoms progressive over past few weeks.   The history is provided by the patient, medical records and a relative. The history is limited by the condition of the patient.  Abdominal Pain Associated symptoms: diarrhea, nausea and vomiting   Associated symptoms: no chest pain, no cough, no dysuria, no fever and no shortness of breath        Home Medications Prior to Admission medications   Medication Sig Start Date End Date Taking? Authorizing Provider  amLODipine  (NORVASC ) 10 MG tablet TAKE 1 TABLET EVERY DAY 12/24/22   [name] HERO, FNP  apixaban  (ELIQUIS ) 5 MG TABS tablet Take 1 tablet (5 mg total) by mouth 2 (two) times daily. 01/13/23 02/12/23  Pasam, Chinita, MD  atorvastatin  (LIPITOR) 40 MG tablet TAKE 1 TABLET EVERY DAY 10/18/22   Walker, Caitlin S, NP  bisoprolol  (ZEBETA ) 5 MG tablet TAKE 1 AND 1/2 TABLETS EVERY DAY 01/13/23   [name] HERO, FNP  cholestyramine  (QUESTRAN ) 4 g packet Take 0.5-1 packets (2-4 g total) by mouth 2 (two) times daily. 01/28/23   [name] HERO, FNP  cloNIDine  (CATAPRES ) 0.2 MG tablet TAKE 1 TABLET TWICE DAILY 12/24/22   [name] HERO, FNP  dorzolamide -timolol  (COSOPT ) 22.3-6.8 MG/ML ophthalmic solution Place 1 drop into both eyes 2 (two) times daily. 02/04/21   [provider]  hydrALAZINE  (APRESOLINE ) 100 MG tablet TAKE 1 TABLET THREE TIMES DAILY 12/24/22   [name] HERO, FNP  lidocaine -prilocaine  (EMLA ) cream Apply to affected area once 01/13/23   Pasam, Avinash, MD  megestrol  (MEGACE  ES) 625 MG/5ML suspension Take 5 mLs (625 mg total) by mouth daily.  02/10/23   Pasam, Chinita, MD  metFORMIN  (GLUCOPHAGE ) 500 MG tablet TAKE 2 TABLETS TWICE DAILY WITH MEALS Patient taking differently: Take 1,000 mg by mouth 2 (two) times daily with a meal. Patient has only been taking one tablet twice daily due to illness. 01/03/2023. 12/24/22   [name] HERO, FNP  mirtazapine  (REMERON ) 15 MG tablet TAKE 1 TABLET AT BEDTIME 12/27/22   [name] HERO, FNP  Multiple Vitamin (MULTIVITAMIN WITH MINERALS) TABS tablet Take 1 tablet by mouth daily. 01/06/23   Jennelle Riis, MD  ondansetron  (ZOFRAN ) 4 MG tablet Take 1 tablet (4 mg total) by mouth every 8 (eight) hours as needed for nausea or vomiting. 12/30/22   Severa Rock HERO, FNP  ondansetron  (ZOFRAN ) 8 MG tablet Take 1 tablet (8 mg total) by mouth every 8 (eight) hours as needed for nausea or vomiting. 01/13/23   Pasam, Chinita, MD  pantoprazole  (PROTONIX ) 40 MG tablet TAKE 1 TABLET EVERY DAY 12/24/22   [name] HERO, FNP  prochlorperazine  (COMPAZINE ) 10 MG tablet Take 1 tablet (10 mg total) by mouth every 6 (six) hours as needed for nausea or vomiting. 01/13/23   Pasam, Chinita, MD  spironolactone  (ALDACTONE ) 25 MG tablet Take 1 tablet (25 mg total) by mouth daily. 03/09/22   Raford Annabella, MD  traMADol  (ULTRAM ) 50 MG tablet Take 1 tablet (50 mg total) by mouth every 6 (six) hours as needed. 02/11/23   Autumn Chinita, MD  TRUE METRIX BLOOD  GLUCOSE TEST test strip 1 each by Other route 4 (four) times daily as needed. 01/29/20   [provider]  TRUEplus Lancets 33G MISC Test BS up to four times daily as needed Dx E11.9 01/25/20   Merlynn Niki FALCON, FNP  valsartan  (DIOVAN ) 320 MG tablet TAKE 1 TABLET EVERY DAY 01/31/23   Raford Riggs, MD  Vitamin D , Ergocalciferol , (DRISDOL ) 1.25 MG (50000 UNIT) CAPS capsule Take 1 capsule (50,000 Units total) by mouth every 7 (seven) days. 11/04/22   Joesph Riggs HERO, FNP      Allergies    Hctz [hydrochlorothiazide] and Lisinopril     Review of Systems   Review of  Systems  Constitutional:  Negative for fever.  Respiratory:  Negative for cough and shortness of breath.   Cardiovascular:  Negative for chest pain.  Gastrointestinal:  Positive for abdominal pain, diarrhea, nausea and vomiting.  Genitourinary:  Negative for dysuria and flank pain.  Musculoskeletal:  Negative for neck pain and neck stiffness.  Skin:  Negative for rash.  Neurological:  Positive for weakness. Negative for headaches.    Physical Exam Updated Vital Signs BP (!) 165/72   Pulse 77   Temp 97.6 F (36.4 C) (Oral)   Resp 16   Ht 1.6 m (5' 3)   Wt 61.2 kg   SpO2 95%   BMI 23.91 kg/m  Physical Exam Vitals and nursing note reviewed.  Constitutional:      Appearance: She is well-developed. She is ill-appearing.     Comments: Frail, chronically ill appearing.   HENT:     Head: Atraumatic.     Nose: Nose normal.     Mouth/Throat:     Mouth: Mucous membranes are moist.  Eyes:     General: No scleral icterus.    Conjunctiva/sclera: Conjunctivae normal.     Pupils: Pupils are equal, round, and reactive to light.  Neck:     Trachea: No tracheal deviation.  Cardiovascular:     Rate and Rhythm: Regular rhythm. Tachycardia present.     Pulses: Normal pulses.     Heart sounds: Normal heart sounds. No murmur heard.    No friction rub. No gallop.  Pulmonary:     Effort: Pulmonary effort is normal. No respiratory distress.     Breath sounds: Normal breath sounds.  Abdominal:     General: Bowel sounds are normal. There is distension.     Palpations: Abdomen is soft.     Tenderness: There is no abdominal tenderness. There is no guarding.     Comments: +ascites  Genitourinary:    Comments: No cva tenderness.  Musculoskeletal:        General: No tenderness.     Cervical back: Normal range of motion and neck supple. No rigidity. No muscular tenderness.     Right lower leg: Edema present.     Left lower leg: Edema present.  Skin:    General: Skin is warm and dry.      Findings: No rash.  Neurological:     Mental Status: She is alert.     Comments: Alert, speech normal.     ED Results / Procedures / Treatments   Labs (all labs ordered are listed, but only abnormal results are displayed) Results for orders placed or performed during the hospital encounter of 02/12/23  Resp panel by RT-PCR (RSV, Flu A&B, Covid) Anterior Nasal Swab   Collection Time: 02/12/23 11:03 AM   Specimen: Anterior Nasal Swab  Result Value Ref Range  SARS Coronavirus 2 by RT PCR NEGATIVE NEGATIVE   Influenza A by PCR NEGATIVE NEGATIVE   Influenza B by PCR NEGATIVE NEGATIVE   Resp Syncytial Virus by PCR NEGATIVE NEGATIVE  Comprehensive metabolic panel   Collection Time: 02/12/23 11:10 AM  Result Value Ref Range   Sodium 141 135 - 145 mmol/L   Potassium 5.0 3.5 - 5.1 mmol/L   Chloride 108 98 - 111 mmol/L   CO2 19 (L) 22 - 32 mmol/L   Glucose, Bld 101 (H) 70 - 99 mg/dL   BUN 31 (H) 8 - 23 mg/dL   Creatinine, Ser 8.40 (H) 0.44 - 1.00 mg/dL   Calcium  8.5 (L) 8.9 - 10.3 mg/dL   Total Protein 5.8 (L) 6.5 - 8.1 g/dL   Albumin 2.5 (L) 3.5 - 5.0 g/dL   AST 76 (H) 15 - 41 U/L   ALT 20 0 - 44 U/L   Alkaline Phosphatase 226 (H) 38 - 126 U/L   Total Bilirubin 1.4 (H) 0.0 - 1.2 mg/dL   GFR, Estimated 32 (L) >60 mL/min   Anion gap 14 5 - 15  Lipase, blood   Collection Time: 02/12/23 11:10 AM  Result Value Ref Range   Lipase 51 11 - 51 U/L  CBC with Differential   Collection Time: 02/12/23 11:10 AM  Result Value Ref Range   WBC 20.1 (H) 4.0 - 10.5 K/uL   RBC 5.11 3.87 - 5.11 MIL/uL   Hemoglobin 13.8 12.0 - 15.0 g/dL   HCT 55.8 63.9 - 53.9 %   MCV 86.3 80.0 - 100.0 fL   MCH 27.0 26.0 - 34.0 pg   MCHC 31.3 30.0 - 36.0 g/dL   RDW 79.3 (H) 88.4 - 84.4 %   Platelets 506 (H) 150 - 400 K/uL   nRBC 0.0 0.0 - 0.2 %   Neutrophils Relative % 83 %   Neutro Abs 16.8 (H) 1.7 - 7.7 K/uL   Lymphocytes Relative 10 %   Lymphs Abs 1.9 0.7 - 4.0 K/uL   Monocytes Relative 6 %   Monocytes  Absolute 1.2 (H) 0.1 - 1.0 K/uL   Eosinophils Relative 0 %   Eosinophils Absolute 0.0 0.0 - 0.5 K/uL   Basophils Relative 0 %   Basophils Absolute 0.1 0.0 - 0.1 K/uL   Immature Granulocytes 1 %   Abs Immature Granulocytes 0.10 (H) 0.00 - 0.07 K/uL   DG Chest 2 View Result Date: 02/12/2023 CLINICAL DATA:  Cough, stage IV liver cancer EXAM: CHEST - 2 VIEW COMPARISON:  07/08/2018 chest radiograph. FINDINGS: Right Port-A-Cath terminates at the cavoatrial junction. Stable cardiomediastinal silhouette with normal heart size. No pneumothorax. Small bilateral pleural effusions, right greater than left. No overt pulmonary edema. Mild right basilar atelectasis. IMPRESSION: Small bilateral pleural effusions, right greater than left. Mild right basilar atelectasis. Electronically Signed   By: Selinda DELENA Blue M.D.   On: 02/12/2023 12:06   CT ABDOMEN PELVIS W CONTRAST Result Date: 02/12/2023 CLINICAL DATA:  Worsening abdominal pain with nausea and vomiting. History of metastatic HCC. EXAM: CT ABDOMEN AND PELVIS WITH CONTRAST TECHNIQUE: Multidetector CT imaging of the abdomen and pelvis was performed using the standard protocol following bolus administration of intravenous contrast. RADIATION DOSE REDUCTION: This exam was performed according to the departmental dose-optimization program which includes automated exposure control, adjustment of the mA and/or kV according to patient size and/or use of iterative reconstruction technique. CONTRAST:  75mL OMNIPAQUE  IOHEXOL  350 MG/ML SOLN COMPARISON:  CT chest dated January 05, 2023. MRI  abdomen dated January 03, 2023. CT abdomen pelvis dated December 25, 2018. FINDINGS: Lower chest: Increasing small right pleural effusion. Unchanged 1 cm metastasis in the medial right lower lobe. Resolved trace left pleural effusion. Unchanged small pericardial effusion. Hepatobiliary: Numerous liver masses overall appear stable or decreased in size when compared to visualization on most recent  chest CT from January 1st. For example, a central lesion previously measuring 3.4 cm currently measures 2.3 cm (series 3, image 12). 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 Proximal portal vein, splenic vein and SMV remain patent. Distended, thin walled gallbladder with several tiny gallstones again noted. Unchanged mild central intrahepatic and proximal common bile duct dilatation up to 11 mm. Small filling defect again noted in the distal common bile duct (series 3, image 34). Pancreas: Unremarkable. No pancreatic ductal dilatation or surrounding inflammatory changes. Spleen: Normal in size without focal abnormality. Adrenals/Urinary Tract: Adrenal glands are unremarkable. Kidneys are normal, without renal calculi, focal lesion, or hydronephrosis. Bladder is unremarkable. Stomach/Bowel: Unchanged small hiatal hernia. The stomach is otherwise within normal limits. Mild colonic wall thickening near the hepatic flexure is likely due to portal colopathy. No obstruction. Mild left-sided colonic diverticulosis. Vascular/Lymphatic: Portal thrombosis as described above. Aortoiliac atherosclerotic vascular disease. Unchanged enlarged portacaval lymph nodes measuring up to 1.2 cm. Reproductive: Uterus and bilateral adnexa are unremarkable. Other: New moderate ascites.  No pneumoperitoneum. Musculoskeletal: No acute or significant osseous findings. IMPRESSION: 1. Multifocal hepatocellular carcinoma involving both liver lobes with overall stable or decreased humerus size when compared to most recent chest CT. 2. 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 3. Unchanged mild central intrahepatic and proximal common bile duct dilatation up to 11 mm. Small filling defect again noted  in the distal common bile duct, concerning for choledocholithiasis. 4. New moderate ascites. 5. Increasing small right pleural effusion. Resolved trace left pleural effusion. Unchanged small pericardial effusion. 6.  Aortic Atherosclerosis (ICD10-I70.0). Electronically Signed   By: Elsie ONEIDA Shoulder M.D.   On: 02/12/2023 12:05   US  ASCITES (ABDOMEN LIMITED) Result Date: 02/11/2023 CLINICAL DATA:  83 year old female with sudden abdominal swelling. Malignant liver tumors. EXAM: LIMITED ABDOMEN ULTRASOUND FOR ASCITES TECHNIQUE: Limited ultrasound survey for ascites was performed in all four abdominal quadrants. COMPARISON:  Abdomen MRI 01/03/2023.  Chest CT 01/05/2023. FINDINGS: Grayscale images of the abdomen in all 4 quadrants now demonstrate a moderate to large volume of ascites in the right abdomen including surrounding the liver (image 4) and bowel loops in the right lower quadrant (image 5). Interestingly, there is little to no ascites fluid visible in the left abdomen. This might be related to patient positioning. Extremely heterogeneous liver echotexture consistent with extensive liver tumors again noted. IMPRESSION: 1. Moderate to large volume of ascites although limited to the right abdomen, increased from 01/03/23 and 01/05/23 imaging. 2. Extensive liver tumor again noted. Electronically Signed   By: VEAR Hurst M.D.   On: 02/11/2023 09:01   IR IMAGING GUIDED PORT INSERTION Result Date: 01/19/2023 INDICATION: History of multifocal hepatocellular carcinoma. In need of durable intravenous access for chemotherapy administration. EXAM: IMPLANTED PORT A CATH PLACEMENT WITH ULTRASOUND AND FLUOROSCOPIC GUIDANCE COMPARISON:  Chest CT-01/05/2023 MEDICATIONS: None ANESTHESIA/SEDATION: Moderate (conscious) sedation was employed during this procedure as administered by the Interventional Radiology RN. A total of Benadryl  25 mg, Versed   2 mg and Fentanyl  100 mcg was administered intravenously. Moderate Sedation Time: 28  minutes. The patient's level of consciousness and vital signs were monitored continuously by radiology nursing throughout the procedure under my direct supervision. CONTRAST:  None FLUOROSCOPY TIME:  24 seconds (1 mGy) COMPLICATIONS: None immediate. PROCEDURE: The procedure, risks, benefits, and alternatives were explained to the patient. Questions regarding the procedure were encouraged and answered. The patient understands and consents to the procedure. The right neck and chest were prepped with chlorhexidine  in a sterile fashion, and a sterile drape was applied covering the operative field. Maximum barrier sterile technique with sterile gowns and gloves were used for the procedure. A timeout was performed prior to the initiation of the procedure. Local anesthesia was provided with 1% lidocaine  with epinephrine . After creating a small venotomy incision, a micropuncture kit was utilized to access the internal jugular vein. Real-time ultrasound guidance was utilized for vascular access including the acquisition of a permanent ultrasound image documenting patency of the accessed vessel. The microwire was utilized to measure appropriate catheter length. A subcutaneous port pocket was then created along the upper chest wall utilizing a combination of sharp and blunt dissection. The pocket was irrigated with sterile saline. A single lumen clear view power injectable port was chosen for placement. The 8 Fr catheter was tunneled from the port pocket site to the venotomy incision. The port was placed in the pocket. The external catheter was trimmed to appropriate length. At the venotomy, an 8 Fr peel-away sheath was placed over a guidewire under fluoroscopic guidance. The catheter was then placed through the sheath and the sheath was removed. Final catheter positioning was confirmed and documented with a fluoroscopic spot radiograph. The port was accessed with a Huber needle, aspirated and flushed with heparinized saline.  The venotomy site was closed with an interrupted 4-0 Vicryl suture. The port pocket incision was closed with interrupted 2-0 Vicryl suture. The skin was opposed with a running subcuticular 4-0 Vicryl suture. Dermabond and Steri-strips were applied to both incisions. Dressings were applied. The patient tolerated the procedure well without immediate post procedural complication. FINDINGS: After catheter placement, the tip lies within the superior cavoatrial junction. The catheter aspirates and flushes normally and is ready for immediate use. IMPRESSION: Successful placement of a right internal jugular approach power injectable Port-A-Cath. The catheter is ready for immediate use. Electronically Signed   By: Norleen Roulette M.D.   On: 01/19/2023 15:46    EKG None  Radiology DG Chest 2 View Result Date: 02/12/2023 CLINICAL DATA:  Cough, stage IV liver cancer EXAM: CHEST - 2 VIEW COMPARISON:  07/08/2018 chest radiograph. FINDINGS: Right Port-A-Cath terminates at the cavoatrial junction. Stable cardiomediastinal silhouette with normal heart size. No pneumothorax. Small bilateral pleural effusions, right greater than left. No overt pulmonary edema. Mild right basilar atelectasis. IMPRESSION: Small bilateral pleural effusions, right greater than left. Mild right basilar atelectasis. Electronically Signed   By: Selinda DELENA Blue M.D.   On: 02/12/2023 12:06   CT ABDOMEN PELVIS W CONTRAST Result Date: 02/12/2023 CLINICAL DATA:  Worsening abdominal pain with nausea and vomiting. History of metastatic HCC. EXAM: CT ABDOMEN AND PELVIS WITH CONTRAST TECHNIQUE: Multidetector CT imaging of the abdomen and pelvis was performed using the standard protocol following bolus administration of intravenous contrast. RADIATION DOSE REDUCTION: This exam was performed according to the departmental dose-optimization program which includes automated exposure control, adjustment of the mA and/or kV according to patient size and/or use of  iterative reconstruction technique. CONTRAST:  75mL OMNIPAQUE  IOHEXOL  350 MG/ML SOLN COMPARISON:  CT chest dated January 05, 2023. MRI abdomen dated January 03, 2023. CT abdomen pelvis dated December 25, 2018. FINDINGS: Lower chest: Increasing small right pleural effusion. Unchanged 1 cm metastasis in the medial right lower lobe. Resolved trace left pleural effusion. Unchanged small pericardial effusion. Hepatobiliary: Numerous liver masses overall appear stable or decreased in size when compared to visualization on most recent chest CT from January 1st. For example, a central lesion previously measuring 3.4 cm currently measures 2.3 cm (series 3, image 12). 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 Proximal portal vein, splenic vein and SMV remain patent. Distended, thin walled gallbladder with several tiny gallstones again noted. Unchanged mild central intrahepatic and proximal common bile duct dilatation up to 11 mm. Small filling defect again noted in the distal common bile duct (series 3, image 34). Pancreas: Unremarkable. No pancreatic ductal dilatation or surrounding inflammatory changes. Spleen: Normal in size without focal abnormality. Adrenals/Urinary Tract: Adrenal glands are unremarkable. Kidneys are normal, without renal calculi, focal lesion, or hydronephrosis. Bladder is unremarkable. Stomach/Bowel: Unchanged small hiatal hernia. The stomach is otherwise within normal limits. Mild colonic wall thickening near the hepatic flexure is likely due to portal colopathy. No obstruction. Mild left-sided colonic diverticulosis. Vascular/Lymphatic: Portal thrombosis as described above. Aortoiliac atherosclerotic vascular disease. Unchanged enlarged portacaval lymph nodes measuring up to 1.2 cm. Reproductive: Uterus and bilateral adnexa are unremarkable. Other: New moderate ascites.  No  pneumoperitoneum. Musculoskeletal: No acute or significant osseous findings. IMPRESSION: 1. Multifocal hepatocellular carcinoma involving both liver lobes with overall stable or decreased humerus size when compared to most recent chest CT. 2. 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 3. Unchanged mild central intrahepatic and proximal common bile duct dilatation up to 11 mm. Small filling defect again noted in the distal common bile duct, concerning for choledocholithiasis. 4. New moderate ascites. 5. Increasing small right pleural effusion. Resolved trace left pleural effusion. Unchanged small pericardial effusion. 6.  Aortic Atherosclerosis (ICD10-I70.0). Electronically Signed   By: Elsie ONEIDA Shoulder M.D.   On: 02/12/2023 12:05   US  ASCITES (ABDOMEN LIMITED) Result Date: 02/11/2023 CLINICAL DATA:  83 year old female with sudden abdominal swelling. Malignant liver tumors. EXAM: LIMITED ABDOMEN ULTRASOUND FOR ASCITES TECHNIQUE: Limited ultrasound survey for ascites was performed in all four abdominal quadrants. COMPARISON:  Abdomen MRI 01/03/2023.  Chest CT 01/05/2023. FINDINGS: Grayscale images of the abdomen in all 4 quadrants now demonstrate a moderate to large volume of ascites in the right abdomen including surrounding the liver (image 4) and bowel loops in the right lower quadrant (image 5). Interestingly, there is little to no ascites fluid visible in the left abdomen. This might be related to patient positioning. Extremely heterogeneous liver echotexture consistent with extensive liver tumors again noted. IMPRESSION: 1. Moderate to large volume of ascites although limited to the right abdomen, increased from 01/03/23 and 01/05/23 imaging. 2. Extensive liver tumor again noted. Electronically Signed   By: VEAR Hurst M.D.   On: 02/11/2023 09:01    Procedures Procedures     Medications Ordered in ED Medications  ondansetron  (ZOFRAN -ODT) disintegrating tablet 4 mg (0 mg Oral Hold 02/12/23 1314)  oxyCODONE -acetaminophen  (PERCOCET/ROXICET) 5-325 MG per tablet 1 tablet (0 tablets Oral Hold 02/12/23 1314)  iohexol  (OMNIPAQUE ) 350 MG/ML injection 75 mL (75  mLs Intravenous Contrast Given 02/12/23 1132)  lactated ringers  bolus 1,000 mL (1,000 mLs Intravenous New Bag/Given 02/12/23 1333)  morphine  (PF) 2 MG/ML injection 2 mg (2 mg Intravenous Given 02/12/23 1333)  ondansetron  (ZOFRAN ) injection 4 mg (4 mg Intravenous Given 02/12/23 1332)    ED Course/ Medical Decision Making/ A&P                                 Medical Decision Making Problems Addressed: Abdominal pain, generalized: acute illness or injury with systemic symptoms that poses a threat to life or bodily functions AKI (acute kidney injury) [facility]): acute illness or injury with systemic symptoms that poses a threat to life or bodily functions Dehydration: acute illness or injury with systemic symptoms that poses a threat to life or bodily functions Elevated LFTs: acute illness or injury Hypoalbuminemia: acute illness or injury    Details: Acute/chronic Malignant ascites: acute illness or injury with systemic symptoms that poses a threat to life or bodily functions 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 Labs: ordered. Decision-making details documented in ED Course. Radiology: ordered and independent interpretation performed. ECG/medicine tests: ordered and independent interpretation performed. Decision-making details documented in ED Course. Discussion of management or test interpretation with external provider(s): medicine  Risk Prescription drug  management. Parenteral controlled substances. Decision regarding hospitalization.   Iv ns. Continuous pulse ox and cardiac monitoring. Labs ordered/sent. Imaging ordered.   Differential diagnosis includes ascites, liver failure, progression of metastatic disease, dehydration, aki, etc. Dispo decision including potential need for admission considered - will get labs and imaging and reassess.   Reviewed nursing notes and prior charts for additional history. External reports reviewed. Additional history from: family.   Cardiac monitor: sinus rhythm, rate 120.   Labs reviewed/interpreted by me - wbc elev, 20. Hgb 14. Cr elevated compared to baseline c/w aki. Ua pending.   Xrays reviewed/interpreted by me - no def pna. Effusions.   CT reviewed/interpreted by me - metastatic cancer, portal vessel thrombosis, ascites.   Morphine  iv. Zofran  iv. LR bolus.   Medicine consulted for admission, re dehydration, anorexia, abd pain, ftt.             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

## 2023-02-12 NOTE — Assessment & Plan Note (Addendum)
 Supportive care and MIVF. IV PPI. Stool studies for c.diff / occult and culture.

## 2023-02-12 NOTE — Assessment & Plan Note (Addendum)
 2/2 to [facility] and ascites. We will  start rocephin  and follow cultures. Prn morphine  and hydrocodone.  Paracentesis in AM with IR , Eliquis  held tonight.

## 2023-02-12 NOTE — Assessment & Plan Note (Addendum)
 New diagnosis since December 2024. Pt is currently receiving immunotherapy. CODE STATUS reviewed and wish to be full code.

## 2023-02-12 NOTE — Assessment & Plan Note (Addendum)
?   Infection we will start rocephin  less likely SBP pt is nontoxic.  Small BL pleural effusions no consolidation.

## 2023-02-12 NOTE — Assessment & Plan Note (Addendum)
 Stable/ Compensated.  On MIVF. Strict I/O.  Continue aldactone / amlodipine  / hydralazine  and catapres .

## 2023-02-12 NOTE — Assessment & Plan Note (Addendum)
 Pt on eliquis  will resume tomorrow in afternoon after paracentesis.

## 2023-02-12 NOTE — Assessment & Plan Note (Addendum)
 Home meds continued with amlodipine  and aldactone  and hydralazine .clonidine  continued.

## 2023-02-12 NOTE — H&P (Signed)
 History and Physical    Patient: Heather Oconnor FMW:[id number] DOB: 06-28-1940 DOA: 02/12/2023 DOS: the patient was seen and examined on 02/12/2023 PCP: [name] HERO, FNP  Patient coming from: Home Chief complaint: Chief Complaint  Patient presents with   Abdominal Pain   HPI:  Heather Oconnor is a 83 y.o. female with past medical history  of hepatocellular cancer, portal vein thrombosis, allergy to HCTZ and lisinopril , diabetes mellitus type 2, essential hypertension, history of pancreatitis, history of cirrhosis, presenting from home for abdominal pain.  Chart review shows that daughter had called primary care and oncology about mom's abdomen having sudden increase in size and patient is scheduled for paracentesis.  On the past 2 3 days patient has had nausea vomiting diarrhea abdominal pain and patient's had decreased p.o. intake.  Patient's appetite is decreased as well currently patient is receiving immunotherapy.  And is followed by oncology Dr. Autumn.  No reports of chest pain shortness of breath fever rash bleeding dizziness or falls.  >>ED Course: In emergency room patient is afebrile blood pressure stable. Vitals:   02/12/23 1430 02/12/23 1500 02/12/23 1532 02/12/23 1818  BP: (!) 141/69 (!) 156/69  (!) 145/63  Pulse: 78 79  79  Temp:   97.9 F (36.6 C) 98 F (36.7 C)  Resp: 14 16  18   Height:      Weight:      SpO2: 97% 97%  95%  TempSrc:   Oral Oral  BMI (Calculated):       ED evaluation  so far shows: CT of the abdomen shows multifocal hepatocellular cancer carcinoma in both lobes of the liver, portal vein thrombosis, progressive expansile tumor thrombosis in the right portal vein and portal bifurcation, new moderate ascites, increasing small right pleural effusion, unchanged pericardial effusion. Patient has had abdominal limited ultrasound on February 7 showing moderate to large volume ascites. Metabolic panel shows acute kidney injury with a rising creatinine  to 1.59 EGFR of 32 total bili 1.4 AST elevated at 76, bicarb of 19 anion gap 14 normal electrolytes otherwise. Abnormal CBC with leukocytosis of 20.1 normal hemoglobin platelet counts of 506. Respiratory panel is negative for flu and RSV.   In the emergency room  pt has received the following treatment thus far: Medications  ondansetron  (ZOFRAN -ODT) disintegrating tablet 4 mg (0 mg Oral Hold 02/12/23 1314)  oxyCODONE -acetaminophen  (PERCOCET/ROXICET) 5-325 MG per tablet 1 tablet (0 tablets Oral Hold 02/12/23 1314)  iohexol  (OMNIPAQUE ) 350 MG/ML injection 75 mL (75 mLs Intravenous Contrast Given 02/12/23 1132)  lactated ringers  bolus 1,000 mL (1,000 mLs Intravenous New Bag/Given 02/12/23 1333)  morphine  (PF) 2 MG/ML injection 2 mg (2 mg Intravenous Given 02/12/23 1333)  ondansetron  (ZOFRAN ) injection 4 mg (4 mg Intravenous Given 02/12/23 1332)   Review of Systems  Gastrointestinal:  Positive for abdominal pain, diarrhea, nausea and vomiting.   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   Qualifier: Diagnosis of  By: Burnis Pulling     Pancreatitis    Transient cerebral ischemia 05/08/2008   Qualifier: Diagnosis of  By: Burnis Pulling     Past Surgical History:  Procedure Laterality Date   COLONOSCOPY  09/07/2007  RMR: anal canal/external hemorrhoids, redundant colon, left-sided diverticula, otherwise normal colonic mucosa   IR IMAGING GUIDED PORT INSERTION  01/19/2023    reports that she quit smoking about 16 years ago. Her smoking use included cigarettes. She started smoking about 25 years ago. She has never used smokeless tobacco. She reports that she does not drink alcohol and does not use drugs.  Allergies  Allergen Reactions   Hctz  [Hydrochlorothiazide]     Per GI may have caused pancreatitis. Dr. Teressa recommended never resuming.   Lisinopril      H/O pancreatitis.    Family History  Problem Relation Age of Onset   Hypertension Mother    Heart attack Father    Hypertension Father    Hypertension Sister    Hypertension Brother    Hypertension Brother    Diabetes Son    Hypertension Son    Colon cancer Neg Hx     Prior to Admission medications   Medication Sig Start Date End Date Taking? Authorizing Provider  amLODipine  (NORVASC ) 10 MG tablet TAKE 1 TABLET EVERY DAY 12/24/22   [name] HERO, FNP  apixaban  (ELIQUIS ) 5 MG TABS tablet Take 1 tablet (5 mg total) by mouth 2 (two) times daily. 01/13/23 02/12/23  Pasam, Chinita, MD  atorvastatin  (LIPITOR) 40 MG tablet TAKE 1 TABLET EVERY DAY 10/18/22   Walker, Caitlin S, NP  bisoprolol  (ZEBETA ) 5 MG tablet TAKE 1 AND 1/2 TABLETS EVERY DAY 01/13/23   [name] HERO, FNP  cholestyramine  (QUESTRAN ) 4 g packet Take 0.5-1 packets (2-4 g total) by mouth 2 (two) times daily. 01/28/23   [name] HERO, FNP  cloNIDine  (CATAPRES ) 0.2 MG tablet TAKE 1 TABLET TWICE DAILY 12/24/22   [name] HERO, FNP  dorzolamide -timolol  (COSOPT ) 22.3-6.8 MG/ML ophthalmic solution Place 1 drop into both eyes 2 (two) times daily. 02/04/21   [provider]  hydrALAZINE  (APRESOLINE ) 100 MG tablet TAKE 1 TABLET THREE TIMES DAILY 12/24/22   [name] HERO, FNP  lidocaine -prilocaine  (EMLA ) cream Apply to affected area once 01/13/23   Pasam, Avinash, MD  megestrol  (MEGACE  ES) 625 MG/5ML suspension Take 5 mLs (625 mg total) by mouth daily. 02/10/23   Pasam, Chinita, MD  metFORMIN  (GLUCOPHAGE ) 500 MG tablet TAKE 2 TABLETS TWICE DAILY WITH MEALS Patient taking differently: Take 1,000 mg by mouth 2 (two) times daily with a meal. Patient has only been taking one tablet twice daily due to illness. 01/03/2023. 12/24/22   [name] HERO, FNP  mirtazapine  (REMERON ) 15 MG tablet TAKE 1 TABLET AT  BEDTIME 12/27/22   [name] HERO, FNP  Multiple Vitamin (MULTIVITAMIN WITH MINERALS) TABS tablet Take 1 tablet by mouth daily. 01/06/23   Jennelle Riis, MD  ondansetron  (ZOFRAN ) 4 MG tablet Take 1 tablet (4 mg total) by mouth every 8 (eight) hours as needed for nausea or vomiting. 12/30/22   Severa Rock HERO, FNP  ondansetron  (ZOFRAN ) 8 MG tablet Take 1 tablet (8 mg total) by mouth every 8 (eight) hours as needed for nausea or vomiting. 01/13/23   Pasam, Chinita, MD  pantoprazole  (PROTONIX ) 40 MG tablet TAKE 1 TABLET EVERY DAY 12/24/22   [name] HERO, FNP  prochlorperazine  (COMPAZINE ) 10 MG tablet Take 1 tablet (10 mg total) by mouth every 6 (six) hours as needed for nausea or vomiting. 01/13/23   Pasam, Chinita, MD  spironolactone  (ALDACTONE ) 25 MG tablet Take 1 tablet (25 mg total) by mouth daily. 03/09/22   Raford Annabella, MD  traMADol  (ULTRAM ) 50 MG  tablet Take 1 tablet (50 mg total) by mouth every 6 (six) hours as needed. 02/11/23   Pasam, Chinita, MD  TRUE METRIX BLOOD GLUCOSE TEST test strip 1 each by Other route 4 (four) times daily as needed. 01/29/20   [provider]  TRUEplus Lancets 33G MISC Test BS up to four times daily as needed Dx E11.9 01/25/20   Merlynn Niki FALCON, FNP  valsartan  (DIOVAN ) 320 MG tablet TAKE 1 TABLET EVERY DAY 01/31/23   Raford Riggs, MD  Vitamin D , Ergocalciferol , (DRISDOL ) 1.25 MG (50000 UNIT) CAPS capsule Take 1 capsule (50,000 Units total) by mouth every 7 (seven) days. 11/04/22   Joesph Riggs HERO, FNP     Vitals:   02/12/23 1430 02/12/23 1500 02/12/23 1532 02/12/23 1818  BP: (!) 141/69 (!) 156/69  (!) 145/63  Pulse: 78 79  79  Resp: 14 16  18   Temp:   97.9 F (36.6 C) 98 F (36.7 C)  TempSrc:   Oral Oral  SpO2: 97% 97%  95%  Weight:      Height:       Physical Exam Vitals and nursing note reviewed.  Constitutional:      General: She is not in acute distress. HENT:     Head: Normocephalic and atraumatic.     Right Ear: Hearing and  external ear normal.     Left Ear: Hearing and external ear normal.     Nose: Nose normal. No nasal deformity.     Mouth/Throat:     Lips: Pink.     Tongue: No lesions.     Pharynx: Oropharynx is clear.  Eyes:     General: Lids are normal.     Extraocular Movements: Extraocular movements intact.  Cardiovascular:     Rate and Rhythm: Normal rate and regular rhythm.     Heart sounds: Normal heart sounds.  Pulmonary:     Effort: Pulmonary effort is normal.     Breath sounds: Normal breath sounds.  Abdominal:     General: Bowel sounds are normal. There is distension.     Palpations: Abdomen is soft. There is mass.     Tenderness: There is abdominal tenderness. There is guarding. There is no rebound.    Musculoskeletal:     Right lower leg: No edema.     Left lower leg: No edema.  Skin:    General: Skin is warm.  Neurological:     General: No focal deficit present.     Mental Status: She is alert and oriented to person, place, and time.     Cranial Nerves: Cranial nerves 2-12 are intact.  Psychiatric:        Attention and Perception: Attention normal.        Mood and Affect: Mood normal.        Speech: Speech normal.        Behavior: Behavior normal. Behavior is cooperative.      Labs on Admission: I have personally reviewed following labs and imaging studies Results for orders placed or performed during the hospital encounter of 02/12/23 (from the past 24 hours)  Resp panel by RT-PCR (RSV, Flu A&B, Covid) Anterior Nasal Swab     Status: None   Collection Time: 02/12/23 11:03 AM   Specimen: Anterior Nasal Swab  Result Value Ref Range   SARS Coronavirus 2 by RT PCR NEGATIVE NEGATIVE   Influenza A by PCR NEGATIVE NEGATIVE   Influenza B by PCR NEGATIVE NEGATIVE   Resp Syncytial  Virus by PCR NEGATIVE NEGATIVE  Comprehensive metabolic panel     Status: Abnormal   Collection Time: 02/12/23 11:10 AM  Result Value Ref Range   Sodium 141 135 - 145 mmol/L   Potassium 5.0 3.5 -  5.1 mmol/L   Chloride 108 98 - 111 mmol/L   CO2 19 (L) 22 - 32 mmol/L   Glucose, Bld 101 (H) 70 - 99 mg/dL   BUN 31 (H) 8 - 23 mg/dL   Creatinine, Ser 8.40 (H) 0.44 - 1.00 mg/dL   Calcium  8.5 (L) 8.9 - 10.3 mg/dL   Total Protein 5.8 (L) 6.5 - 8.1 g/dL   Albumin 2.5 (L) 3.5 - 5.0 g/dL   AST 76 (H) 15 - 41 U/L   ALT 20 0 - 44 U/L   Alkaline Phosphatase 226 (H) 38 - 126 U/L   Total Bilirubin 1.4 (H) 0.0 - 1.2 mg/dL   GFR, Estimated 32 (L) >60 mL/min   Anion gap 14 5 - 15  Lipase, blood     Status: None   Collection Time: 02/12/23 11:10 AM  Result Value Ref Range   Lipase 51 11 - 51 U/L  CBC with Differential     Status: Abnormal   Collection Time: 02/12/23 11:10 AM  Result Value Ref Range   WBC 20.1 (H) 4.0 - 10.5 K/uL   RBC 5.11 3.87 - 5.11 MIL/uL   Hemoglobin 13.8 12.0 - 15.0 g/dL   HCT 55.8 63.9 - 53.9 %   MCV 86.3 80.0 - 100.0 fL   MCH 27.0 26.0 - 34.0 pg   MCHC 31.3 30.0 - 36.0 g/dL   RDW 79.3 (H) 88.4 - 84.4 %   Platelets 506 (H) 150 - 400 K/uL   nRBC 0.0 0.0 - 0.2 %   Neutrophils Relative % 83 %   Neutro Abs 16.8 (H) 1.7 - 7.7 K/uL   Lymphocytes Relative 10 %   Lymphs Abs 1.9 0.7 - 4.0 K/uL   Monocytes Relative 6 %   Monocytes Absolute 1.2 (H) 0.1 - 1.0 K/uL   Eosinophils Relative 0 %   Eosinophils Absolute 0.0 0.0 - 0.5 K/uL   Basophils Relative 0 %   Basophils Absolute 0.1 0.0 - 0.1 K/uL   Immature Granulocytes 1 %   Abs Immature Granulocytes 0.10 (H) 0.00 - 0.07 K/uL  Ammonia     Status: None   Collection Time: 02/12/23  2:20 PM  Result Value Ref Range   Ammonia 28 9 - 35 umol/L  Protime-INR     Status: Abnormal   Collection Time: 02/12/23  2:20 PM  Result Value Ref Range   Prothrombin Time 18.4 (H) 11.4 - 15.2 seconds   INR 1.5 (H) 0.8 - 1.2  Lactic acid, plasma     Status: None   Collection Time: 02/12/23  6:57 PM  Result Value Ref Range   Lactic Acid, Venous 1.9 0.5 - 1.9 mmol/L   Recent Results (from the past 720 hours)  Resp panel by RT-PCR (RSV,  Flu A&B, Covid) Anterior Nasal Swab     Status: None   Collection Time: 02/12/23 11:03 AM   Specimen: Anterior Nasal Swab  Result Value Ref Range Status   SARS Coronavirus 2 by RT PCR NEGATIVE NEGATIVE Final   Influenza A by PCR NEGATIVE NEGATIVE Final   Influenza B by PCR NEGATIVE NEGATIVE Final    Comment: (NOTE) The Xpert Xpress SARS-CoV-2/FLU/RSV plus assay is intended as an aid in the diagnosis of influenza from Nasopharyngeal  swab specimens and should not be used as a sole basis for treatment. Nasal washings and aspirates are unacceptable for Xpert Xpress SARS-CoV-2/FLU/RSV testing.  Fact Sheet for Patients: bloggercourse.com  Fact Sheet for Healthcare Providers: seriousbroker.it  This test is not yet approved or cleared by the [place]  FDA and has been authorized for detection and/or diagnosis of SARS-CoV-2 by FDA under an Emergency Use Authorization (EUA). This EUA will remain in effect (meaning this test can be used) for the duration of the COVID-19 declaration under Section 564(b)(1) of the Act, 21 U.S.C. section 360bbb-3(b)(1), unless the authorization is terminated or revoked.     Resp Syncytial Virus by PCR NEGATIVE NEGATIVE Final    Comment: (NOTE) Fact Sheet for Patients: bloggercourse.com  Fact Sheet for Healthcare Providers: seriousbroker.it  This test is not yet approved or cleared by the [place]  FDA and has been authorized for detection and/or diagnosis of SARS-CoV-2 by FDA under an Emergency Use Authorization (EUA). This EUA will remain in effect (meaning this test can be used) for the duration of the COVID-19 declaration under Section 564(b)(1) of the Act, 21 U.S.C. section 360bbb-3(b)(1), unless the authorization is terminated or revoked.  Performed at [facility], [address] [place], [place] [address]    CBC:    Latest Ref  Rng & Units 02/12/2023   11:10 AM 01/21/2023   11:25 AM 01/13/2023   11:58 AM  CBC  WBC 4.0 - 10.5 K/uL 20.1  14.5  13.6   Hemoglobin 12.0 - 15.0 g/dL 86.1  89.5  88.1   Hematocrit 36.0 - 46.0 % 44.1  33.7  37.3   Platelets 150 - 400 K/uL 506  305  547    Basic Metabolic Panel: Recent Labs  Lab 02/12/23 1110  NA 141  K 5.0  CL 108  CO2 19*  GLUCOSE 101*  BUN 31*  CREATININE 1.59*  CALCIUM  8.5*   Creatinine: Lab Results  Component Value Date   CREATININE 1.59 (H) 02/12/2023   CREATININE 1.12 (H) 01/21/2023   CREATININE 0.92 01/13/2023   Liver Function Tests:    Latest Ref Rng & Units 02/12/2023   11:10 AM 01/21/2023   11:25 AM 01/13/2023   11:58 AM  Hepatic Function  Total Protein 6.5 - 8.1 g/dL 5.8  5.8  6.7   Albumin 3.5 - 5.0 g/dL 2.5  3.0  3.6   AST 15 - 41 U/L 76  69  43   ALT 0 - 44 U/L 20  12  12    Alk Phosphatase 38 - 126 U/L 226  156  163   Total Bilirubin 0.0 - 1.2 mg/dL 1.4  0.6  0.5    Coagulation Profile: Recent Labs  Lab 02/12/23 1420  INR 1.5*   Cardiac Enzymes: No results for input(s): CKTOTAL, CKMB, CKMBINDEX, TROPONINI in the last 168 hours. BNP (last 3 results) No results for input(s): PROBNP in the last 8760 hours. HbA1C: No results for input(s): HGBA1C in the last 72 hours. Lipid Profile: No results for input(s): CHOL, HDL, LDLCALC, TRIG, CHOLHDL, LDLDIRECT in the last 72 hours.  Radiological Exams on Admission: DG Chest 2 View Result Date: 02/12/2023 CLINICAL DATA:  Cough, stage IV liver cancer EXAM: CHEST - 2 VIEW COMPARISON:  07/08/2018 chest radiograph. FINDINGS: Right Port-A-Cath terminates at the cavoatrial junction. Stable cardiomediastinal silhouette with normal heart size. No pneumothorax. Small bilateral pleural effusions, right greater than left. No overt pulmonary edema. Mild right basilar atelectasis. IMPRESSION: Small bilateral pleural  effusions, right greater than left. Mild right basilar atelectasis.  Electronically Signed   By: Selinda DELENA Blue M.D.   On: 02/12/2023 12:06   CT ABDOMEN PELVIS W CONTRAST Result Date: 02/12/2023 CLINICAL DATA:  Worsening abdominal pain with nausea and vomiting. History of metastatic HCC. EXAM: CT ABDOMEN AND PELVIS WITH CONTRAST TECHNIQUE: Multidetector CT imaging of the abdomen and pelvis was performed using the standard protocol following bolus administration of intravenous contrast. RADIATION DOSE REDUCTION: This exam was performed according to the departmental dose-optimization program which includes automated exposure control, adjustment of the mA and/or kV according to patient size and/or use of iterative reconstruction technique. CONTRAST:  75mL OMNIPAQUE  IOHEXOL  350 MG/ML SOLN COMPARISON:  CT chest dated January 05, 2023. MRI abdomen dated January 03, 2023. CT abdomen pelvis dated December 25, 2018. FINDINGS: Lower chest: Increasing small right pleural effusion. Unchanged 1 cm metastasis in the medial right lower lobe. Resolved trace left pleural effusion. Unchanged small pericardial effusion. Hepatobiliary: Numerous liver masses overall appear stable or decreased in size when compared to visualization on most recent chest CT from January 1st. For example, a central lesion previously measuring 3.4 cm currently measures 2.3 cm (series 3, image 12). 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 Proximal portal vein, splenic vein and SMV remain patent. Distended, thin walled gallbladder with several tiny gallstones again noted. Unchanged mild central intrahepatic and proximal common bile duct dilatation up to 11 mm. Small filling defect again noted in the distal common bile duct (series 3, image 34). Pancreas: Unremarkable. No pancreatic ductal dilatation or surrounding inflammatory changes. Spleen: Normal in size without focal abnormality. Adrenals/Urinary Tract:  Adrenal glands are unremarkable. Kidneys are normal, without renal calculi, focal lesion, or hydronephrosis. Bladder is unremarkable. Stomach/Bowel: Unchanged small hiatal hernia. The stomach is otherwise within normal limits. Mild colonic wall thickening near the hepatic flexure is likely due to portal colopathy. No obstruction. Mild left-sided colonic diverticulosis. Vascular/Lymphatic: Portal thrombosis as described above. Aortoiliac atherosclerotic vascular disease. Unchanged enlarged portacaval lymph nodes measuring up to 1.2 cm. Reproductive: Uterus and bilateral adnexa are unremarkable. Other: New moderate ascites.  No pneumoperitoneum. Musculoskeletal: No acute or significant osseous findings. IMPRESSION: 1. Multifocal hepatocellular carcinoma involving both liver lobes with overall stable or decreased humerus size when compared to most recent chest CT. 2. 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 3. Unchanged mild central intrahepatic and proximal common bile duct dilatation up to 11 mm. Small filling defect again noted in the distal common bile duct, concerning for choledocholithiasis. 4. New moderate ascites. 5. Increasing small right pleural effusion. Resolved trace left pleural effusion. Unchanged small pericardial effusion. 6.  Aortic Atherosclerosis (ICD10-I70.0). Electronically Signed   By: Elsie ONEIDA Shoulder M.D.   On: 02/12/2023 12:05   US  ASCITES (ABDOMEN LIMITED) Result Date: 02/11/2023 CLINICAL DATA:  83 year old female with sudden abdominal swelling. Malignant liver tumors. EXAM: LIMITED ABDOMEN ULTRASOUND FOR ASCITES TECHNIQUE: Limited ultrasound survey for ascites was performed in all four abdominal quadrants. COMPARISON:  Abdomen MRI 01/03/2023.  Chest CT 01/05/2023. FINDINGS: Grayscale images of the abdomen in all 4 quadrants now demonstrate a moderate to  large volume of ascites in the right abdomen including surrounding the liver (image 4) and bowel loops in the right lower quadrant (image 5). Interestingly, there is little to no ascites fluid visible  in the left abdomen. This might be related to patient positioning. Extremely heterogeneous liver echotexture consistent with extensive liver tumors again noted. IMPRESSION: 1. Moderate to large volume of ascites although limited to the right abdomen, increased from 01/03/23 and 01/05/23 imaging. 2. Extensive liver tumor again noted. Electronically Signed   By: VEAR Hurst M.D.   On: 02/11/2023 09:01    Data Reviewed: Relevant notes from primary care and specialist visits, past discharge summaries as available in EHR, including Care Everywhere. Prior diagnostic testing as pertinent to current admission diagnoses, Updated medications and problem lists for reconciliation ED course, including vitals, labs, imaging, treatment and response to treatment,Triage notes, nursing and pharmacy notes and ED provider's notes Notable results as noted in HPI.Discussed case with EDMD/ ED APP/ or Specialty MD on call and as needed.  Assessment & Plan Nausea vomiting and diarrhea Supportive care and MIVF. IV PPI. Stool studies for c.diff / occult and culture.  GERD IV protonix .  Type 2 diabetes mellitus with hyperglycemia, without long-term current use of insulin  (HCC) Glycemic protocol. Hold metformin . Npo after MN.    Chronic diastolic CHF (congestive heart failure) (HCC) Stable/ Compensated.  On MIVF. Strict I/O.  Continue aldactone / amlodipine  / hydralazine  and catapres .   Hypertension associated with diabetes (HCC) Home meds continued with amlodipine  and aldactone  and hydralazine .clonidine  continued.   Portal vein thrombosis Pt on eliquis  will resume tomorrow in afternoon after paracentesis.  Hepatocellular carcinoma [facility]) New diagnosis since December 2024. Pt is currently receiving immunotherapy. CODE  STATUS reviewed and wish to be full code.     Abdominal pain 2/2 to [facility] and ascites. We will  start rocephin  and follow cultures. Prn morphine  and hydrocodone.  Paracentesis in AM with IR , Eliquis  held tonight.    Leucocytosis ? Infection we will start rocephin  less likely SBP pt is nontoxic.  Small BL pleural effusions no consolidation.   DVT prophylaxis:  Eliquis  on Hold.  Consults:  IR.  Advance Care Planning:    Code Status: Full Code   Family Communication:  Daughter Francis.  Disposition Plan:  Home.  Severity of Illness: The appropriate patient status for this patient is INPATIENT. Inpatient status is judged to be reasonable and necessary in order to provide the required intensity of service to ensure the patient's safety. The patient's presenting symptoms, physical exam findings, and initial radiographic and laboratory data in the context of their chronic comorbidities is felt to place them at high risk for further clinical deterioration. Furthermore, it is not anticipated that the patient will be medically stable for discharge from the hospital within 2 midnights of admission.   * I certify that at the point of admission it is my clinical judgment that the patient will require inpatient hospital care spanning beyond 2 midnights from the point of admission due to high intensity of service, high risk for further deterioration and high frequency of surveillance required.*  Author: Mario LULLA Blanch, MD 02/12/2023 8:03 PM  For on call review [email or web address].   Unresulted Labs (From admission, onward)     Start     Ordered   02/13/23 0500  Comprehensive metabolic panel  Tomorrow morning,   R        02/12/23 1544   02/13/23 0500  CBC  Tomorrow morning,   R        02/12/23 1544   02/13/23 0500  Protime-INR  Tomorrow morning,   R        02/12/23 1544   02/12/23 2003  Gastrointestinal Panel by PCR , Stool  (Gastrointestinal Panel by PCR, Stool                                                                                                                                                      **Does Not include CLOSTRIDIUM DIFFICILE testing. **If CDIFF testing is needed, place order from the C Difficile Testing order set.**)  Once,   R        02/12/23 2002   02/12/23 1500  Lactic acid, plasma  Once,   R        02/12/23 1500   02/12/23 1421  Urinalysis, Routine w reflex microscopic -Urine, Random  Add-on,   AD       Question:  Specimen Source  Answer:  Urine, Random   02/12/23 1420   02/12/23 1101  Urinalysis, w/ Reflex to Culture (Infection Suspected) -Urine, Clean Catch  Once,   URGENT       Question:  Specimen Source  Answer:  Urine, Clean Catch   02/12/23 1102   Unscheduled  Occult blood card to lab, stool  As needed,   R      02/12/23 2002            Orders Placed This Encounter  Procedures   Resp panel by RT-PCR (RSV, Flu A&B, Covid) Anterior Nasal Swab   Gastrointestinal Panel by PCR , Stool   DG Chest 2 View   CT ABDOMEN PELVIS W CONTRAST   IR Paracentesis   Comprehensive metabolic panel   Lipase, blood   CBC with Differential   Urinalysis, w/ Reflex to Culture (Infection Suspected) -Urine, Clean Catch   Ammonia   Protime-INR   Lactic acid, plasma   Urinalysis, Routine w reflex microscopic -Urine, Random   Lactic acid, plasma   Comprehensive metabolic panel   CBC   Protime-INR   Occult blood card to lab, stool   Diet NPO time specified Except for: [employer], Sips with Meds   Maintain IV access   Vital signs   Notify physician (specify)   Mobility Protocol: No Restrictions RN to initiate protocols based on patient's level of care   Refer to Sidebar Report Refer to ICU, Med-Surg, Progressive, and Step-Down Mobility Protocol Sidebars   Initiate Adult Central Line Maintenance and Catheter Protocol for patients with central line (CVC, PICC, Port, Hemodialysis, Trialysis)   Daily weights   Intake and Output   Do not place and if present remove  PureWick   Initiate Oral Care Protocol   Initiate Carrier Fluid Protocol   RN may order General Admission PRN Orders utilizing General Admission PRN medications (through manage orders) for the following patient needs: allergy symptoms (Claritin), cold sores (Carmex), cough (Robitussin DM), eye irritation (Liquifilm Tears), hemorrhoids (Tucks), indigestion (Maalox), minor skin irritation (Hydrocortisone  Cream), muscle pain Lucienne Siad),  nose irritation (saline nasal spray) and sore throat (Chloraseptic spray).   Cardiac Monitoring - Continuous Indefinite   Full code   Consult for Unassigned Medical Admission   Consult to family practice   Enteric precautions (UV disinfection) C difficile, Norovirus   Pulse oximetry check with vital signs   Oxygen therapy Mode or (Route): Nasal cannula; Liters Per Minute: 2; Keep O2 saturation between: greater than 92 %   EKG 12-Lead   Place in observation (patient's expected length of stay will be less than 2 midnights)

## 2023-02-12 NOTE — Assessment & Plan Note (Addendum)
 Glycemic protocol. Hold metformin . Npo after MN.

## 2023-02-13 ENCOUNTER — Observation Stay (HOSPITAL_COMMUNITY): Payer: Medicare HMO

## 2023-02-13 DIAGNOSIS — R112 Nausea with vomiting, unspecified: Secondary | ICD-10-CM | POA: Diagnosis not present

## 2023-02-13 DIAGNOSIS — N179 Acute kidney failure, unspecified: Secondary | ICD-10-CM | POA: Diagnosis present

## 2023-02-13 DIAGNOSIS — Z8249 Family history of ischemic heart disease and other diseases of the circulatory system: Secondary | ICD-10-CM | POA: Diagnosis not present

## 2023-02-13 DIAGNOSIS — I110 Hypertensive heart disease with heart failure: Secondary | ICD-10-CM | POA: Diagnosis present

## 2023-02-13 DIAGNOSIS — R180 Malignant ascites: Secondary | ICD-10-CM | POA: Diagnosis present

## 2023-02-13 DIAGNOSIS — I81 Portal vein thrombosis: Secondary | ICD-10-CM | POA: Diagnosis present

## 2023-02-13 DIAGNOSIS — R54 Age-related physical debility: Secondary | ICD-10-CM | POA: Diagnosis present

## 2023-02-13 DIAGNOSIS — E8809 Other disorders of plasma-protein metabolism, not elsewhere classified: Secondary | ICD-10-CM | POA: Diagnosis present

## 2023-02-13 DIAGNOSIS — R197 Diarrhea, unspecified: Secondary | ICD-10-CM | POA: Diagnosis not present

## 2023-02-13 DIAGNOSIS — E1159 Type 2 diabetes mellitus with other circulatory complications: Secondary | ICD-10-CM | POA: Diagnosis present

## 2023-02-13 DIAGNOSIS — I5032 Chronic diastolic (congestive) heart failure: Secondary | ICD-10-CM | POA: Diagnosis present

## 2023-02-13 DIAGNOSIS — Z7984 Long term (current) use of oral hypoglycemic drugs: Secondary | ICD-10-CM | POA: Diagnosis not present

## 2023-02-13 DIAGNOSIS — K219 Gastro-esophageal reflux disease without esophagitis: Secondary | ICD-10-CM | POA: Diagnosis present

## 2023-02-13 DIAGNOSIS — R188 Other ascites: Secondary | ICD-10-CM | POA: Diagnosis not present

## 2023-02-13 DIAGNOSIS — R1084 Generalized abdominal pain: Secondary | ICD-10-CM | POA: Diagnosis present

## 2023-02-13 DIAGNOSIS — C220 Liver cell carcinoma: Secondary | ICD-10-CM | POA: Diagnosis present

## 2023-02-13 DIAGNOSIS — Z1152 Encounter for screening for COVID-19: Secondary | ICD-10-CM | POA: Diagnosis not present

## 2023-02-13 DIAGNOSIS — H5461 Unqualified visual loss, right eye, normal vision left eye: Secondary | ICD-10-CM | POA: Diagnosis present

## 2023-02-13 DIAGNOSIS — K7460 Unspecified cirrhosis of liver: Secondary | ICD-10-CM | POA: Diagnosis present

## 2023-02-13 DIAGNOSIS — Z833 Family history of diabetes mellitus: Secondary | ICD-10-CM | POA: Diagnosis not present

## 2023-02-13 DIAGNOSIS — Z7901 Long term (current) use of anticoagulants: Secondary | ICD-10-CM | POA: Diagnosis not present

## 2023-02-13 DIAGNOSIS — C7801 Secondary malignant neoplasm of right lung: Secondary | ICD-10-CM | POA: Diagnosis present

## 2023-02-13 DIAGNOSIS — K580 Irritable bowel syndrome with diarrhea: Secondary | ICD-10-CM | POA: Diagnosis present

## 2023-02-13 DIAGNOSIS — E8721 Acute metabolic acidosis: Secondary | ICD-10-CM | POA: Diagnosis not present

## 2023-02-13 DIAGNOSIS — E860 Dehydration: Secondary | ICD-10-CM | POA: Diagnosis present

## 2023-02-13 DIAGNOSIS — I3139 Other pericardial effusion (noninflammatory): Secondary | ICD-10-CM | POA: Diagnosis present

## 2023-02-13 DIAGNOSIS — E11649 Type 2 diabetes mellitus with hypoglycemia without coma: Secondary | ICD-10-CM | POA: Diagnosis present

## 2023-02-13 DIAGNOSIS — D72829 Elevated white blood cell count, unspecified: Secondary | ICD-10-CM | POA: Diagnosis present

## 2023-02-13 LAB — GRAM STAIN

## 2023-02-13 LAB — COMPREHENSIVE METABOLIC PANEL
ALT: 19 U/L (ref 0–44)
AST: 83 U/L — ABNORMAL HIGH (ref 15–41)
Albumin: 2.3 g/dL — ABNORMAL LOW (ref 3.5–5.0)
Alkaline Phosphatase: 237 U/L — ABNORMAL HIGH (ref 38–126)
Anion gap: 12 (ref 5–15)
BUN: 32 mg/dL — ABNORMAL HIGH (ref 8–23)
CO2: 20 mmol/L — ABNORMAL LOW (ref 22–32)
Calcium: 8.2 mg/dL — ABNORMAL LOW (ref 8.9–10.3)
Chloride: 109 mmol/L (ref 98–111)
Creatinine, Ser: 1.67 mg/dL — ABNORMAL HIGH (ref 0.44–1.00)
GFR, Estimated: 30 mL/min — ABNORMAL LOW (ref >60)
Glucose, Bld: 57 mg/dL — ABNORMAL LOW (ref 70–99)
Potassium: 4.9 mmol/L (ref 3.5–5.1)
Sodium: 141 mmol/L (ref 135–145)
Total Bilirubin: 1 mg/dL (ref 0.0–1.2)
Total Protein: 5.4 g/dL — ABNORMAL LOW (ref 6.5–8.1)

## 2023-02-13 LAB — GLUCOSE, RANDOM: Glucose, Bld: 57 mg/dL — ABNORMAL LOW (ref 70–99)

## 2023-02-13 LAB — ALBUMIN, PLEURAL OR PERITONEAL FLUID: Albumin, Fluid: <1.5 g/dL

## 2023-02-13 LAB — CBC
HCT: 38.4 % (ref 36.0–46.0)
Hemoglobin: 12.1 g/dL (ref 12.0–15.0)
MCH: 27.3 pg (ref 26.0–34.0)
MCHC: 31.5 g/dL (ref 30.0–36.0)
MCV: 86.5 fL (ref 80.0–100.0)
Platelets: 312 10*3/uL (ref 150–400)
RBC: 4.44 MIL/uL (ref 3.87–5.11)
RDW: 20.5 % — ABNORMAL HIGH (ref 11.5–15.5)
WBC: 13.8 10*3/uL — ABNORMAL HIGH (ref 4.0–10.5)
nRBC: 0 % (ref 0.0–0.2)

## 2023-02-13 LAB — BODY FLUID CELL COUNT WITH DIFFERENTIAL
Eos, Fluid: 0 %
Lymphs, Fluid: 87 %
Monocyte-Macrophage-Serous Fluid: 8 % — ABNORMAL LOW (ref 50–90)
Neutrophil Count, Fluid: 5 % (ref 0–25)
Total Nucleated Cell Count, Fluid: 108 uL (ref 0–1000)

## 2023-02-13 LAB — OCCULT BLOOD X 1 CARD TO LAB, STOOL: Fecal Occult Bld: NEGATIVE

## 2023-02-13 LAB — GLUCOSE, CAPILLARY
Glucose-Capillary: 177 mg/dL — ABNORMAL HIGH (ref 70–99)
Glucose-Capillary: 27 mg/dL — CL (ref 70–99)
Glucose-Capillary: 57 mg/dL — ABNORMAL LOW (ref 70–99)

## 2023-02-13 LAB — PROTIME-INR
INR: 1.4 — ABNORMAL HIGH (ref 0.8–1.2)
Prothrombin Time: 17 s — ABNORMAL HIGH (ref 11.4–15.2)

## 2023-02-13 MED ORDER — CHLORHEXIDINE GLUCONATE CLOTH 2 % EX PADS
6.0000 | MEDICATED_PAD | Freq: Every day | CUTANEOUS | Status: DC
  Administered 2023-02-13 – 2023-02-15 (×3): 6 via TOPICAL

## 2023-02-13 MED ORDER — LIDOCAINE HCL (PF) 1 % IJ SOLN
10.0000 mL | Freq: Once | INTRAMUSCULAR | Status: AC
  Administered 2023-02-13: 10 mL via INTRADERMAL

## 2023-02-13 MED ORDER — SODIUM CHLORIDE 0.9 % IV SOLN
INTRAVENOUS | Status: DC
Start: 2023-02-13 — End: 2023-03-05

## 2023-02-13 MED ORDER — DEXTROSE 50 % IV SOLN
25.0000 g | Freq: Once | INTRAVENOUS | Status: AC
  Administered 2023-02-13: 25 g via INTRAVENOUS
  Filled 2023-02-13: qty 50

## 2023-02-13 NOTE — Care Management Obs Status (Signed)
 MEDICARE OBSERVATION STATUS NOTIFICATION   Patient Details  Name: Heather Oconnor MRN: [id number] Date of Birth: 31-Aug-1940   Medicare Observation Status Notification Given:  Yes    Omie Bickers, RN 02/13/2023, 3:52 PM

## 2023-02-13 NOTE — Plan of Care (Signed)
  Problem: Education: Goal: Knowledge of General Education information will improve Description: Including pain rating scale, medication(s)/side effects and non-pharmacologic comfort measures Outcome: Progressing   Problem: Clinical Measurements: Goal: Ability to maintain clinical measurements within normal limits will improve Outcome: Progressing   Problem: Nutrition: Goal: Adequate nutrition will be maintained Outcome: Progressing   Problem: Elimination: Goal: Will not experience complications related to urinary retention Outcome: Progressing   Problem: Pain Managment: Goal: General experience of comfort will improve and/or be controlled Outcome: Progressing

## 2023-02-13 NOTE — Procedures (Signed)
 PROCEDURE SUMMARY:  Successful US  guided paracentesis from RUQ.  Yielded 1.1 L of clear yellow fluid.  No immediate complications.  Pt tolerated well.   Specimen sent for labs.  EBL < 2 mL  Fawn Hooks, NP 02/13/2023 11:05 AM

## 2023-02-13 NOTE — Progress Notes (Signed)
 PROGRESS NOTE    Heather Oconnor  FMW:[id number] DOB: 1940/07/18 DOA: 02/12/2023 PCP: [name] HERO, FNP   Brief Narrative:  83 y.o. female with past medical history  of hepatocellular cancer, portal vein thrombosis, allergy to HCTZ and lisinopril , diabetes mellitus type 2, essential hypertension, history of pancreatitis, history of cirrhosis presented with worsening abdominal pain and increasing abdominal swelling.  On presentation, CT of abdomen showed multifocal hepatocellular carcinoma in both lobes of the liver, portal vein thrombosis, progressive expansile tumor thrombosis in the right portal vein and portal bifurcation, new moderate ascites, increasing small right pleural effusion, unchanged pericardial effusion.  Creatinine was 1.59.  WBCs of 20.1.  Influenza/RSV/COVID PCR negative.  She was started on IV antibiotics.  Paracentesis was requested.  Assessment & Plan:   Severe abdominal pain possibly due to worsening ascites, rule out SBP Multifocal hepatocellular carcinoma Portal vein thrombosis Elevated LFTs Goals of care -Imaging as above.  Awaiting IR guided paracentesis.  Follow-up fluid analysis.  Continue empiric Rocephin  for now -Hold spironolactone  today given acute kidney injury.  Strict input output.  Daily weights.  IV fluids plan as below. -Outpatient follow-up with oncology.  Patient has had a new diagnosis of hepatocellular cancer since December 2024.  Patient is receiving immunotherapy -Overall gnosis is guarded to poor.  Currently listed as full code.  Consult palliative care for goals of care discussion -Resume Eliquis  after paracentesis today  Chronic diastolic heart failure Hypertension -Blood pressure intermittently elevated.  Continue amlodipine  and clonidine .  Hold spironolactone  today.  Monitor blood pressure.  Strict input output.  Daily weights.  Leukocytosis -improving.  Monitor  Diabetes mellitus type 2 with hypoglycemia -Metformin  on hold.  Blood  sugars on the lower side.  Monitor  Acute kidney injury -Creatinine worsening to 1.67 today.  Monitor.  Will give gentle hydration  DVT prophylaxis: Resume Eliquis  after paracentesis today Code Status: Full Family Communication: None at bedside Disposition Plan: Status is: Observation The patient will require care spanning > 2 midnights and should be moved to inpatient because: Of severity of illness  Consultants: Consult palliative care  Procedures: None  Antimicrobials: Rocephin  from 02/12/2023 onwards   Subjective: Patient seen and examined at bedside.  Complains of severe abdominal pain.  No agitation, seizures, fever or vomiting reported.  Objective: Vitals:   02/13/23 1020 02/13/23 1025 02/13/23 1030 02/13/23 1035  BP: (!) 160/67 (!) 150/67 (!) 141/60 (!) 143/56  Pulse:      Resp:      Temp:      TempSrc:      SpO2:      Weight:      Height:        Intake/Output Summary (Last 24 hours) at 02/13/2023 1117 Last data filed at 02/12/2023 2340 Gross per 24 hour  Intake 120 ml  Output --  Net 120 ml   Filed Weights   02/12/23 1057 02/13/23 0500  Weight: 61.2 kg 59.9 kg    Examination:  General exam: Appears calm and comfortable.  Looks to be in mild distress secondary to abdominal pain.  On room air.  Chronically ill and deconditioned. Respiratory system: Bilateral decreased breath sounds at bases with crackles Cardiovascular system: S1 & S2 heard, Rate controlled Gastrointestinal system: Abdomen is distended, soft and tender. Normal bowel sounds heard. Extremities: No cyanosis, clubbing; trace lower extremity edema Central nervous system: Alert and oriented.  Slow to respond.  Poor historian.  No focal neurological deficits. Moving extremities Skin: No rashes, lesions or ulcers  Psychiatry: Flat.  Not agitated    Data Reviewed: I have personally reviewed following labs and imaging studies  CBC: Recent Labs  Lab 02/12/23 1110 02/13/23 0609  WBC 20.1* 13.8*   NEUTROABS 16.8*  --   HGB 13.8 12.1  HCT 44.1 38.4  MCV 86.3 86.5  PLT 506* 312   Basic Metabolic Panel: Recent Labs  Lab 02/12/23 1110 02/13/23 0609 02/13/23 0857  NA 141 141  --   K 5.0 4.9  --   CL 108 109  --   CO2 19* 20*  --   GLUCOSE 101* 57* 57*  BUN 31* 32*  --   CREATININE 1.59* 1.67*  --   CALCIUM  8.5* 8.2*  --    GFR: Estimated Creatinine Clearance: 21.5 mL/min (A) (by C-G formula based on SCr of 1.67 mg/dL (H)). Liver Function Tests: Recent Labs  Lab 02/12/23 1110 02/13/23 0609  AST 76* 83*  ALT 20 19  ALKPHOS 226* 237*  BILITOT 1.4* 1.0  PROT 5.8* 5.4*  ALBUMIN 2.5* 2.3*   Recent Labs  Lab 02/12/23 1110  LIPASE 51   Recent Labs  Lab 02/12/23 1420  AMMONIA 28   Coagulation Profile: Recent Labs  Lab 02/12/23 1420 02/13/23 0609  INR 1.5* 1.4*   Cardiac Enzymes: No results for input(s): CKTOTAL, CKMB, CKMBINDEX, TROPONINI in the last 168 hours. BNP (last 3 results) No results for input(s): PROBNP in the last 8760 hours. HbA1C: No results for input(s): HGBA1C in the last 72 hours. CBG: Recent Labs  Lab 02/13/23 0831 02/13/23 0837 02/13/23 0914  GLUCAP 27* 57* 177*   Lipid Profile: No results for input(s): CHOL, HDL, LDLCALC, TRIG, CHOLHDL, LDLDIRECT in the last 72 hours. Thyroid  Function Tests: No results for input(s): TSH, T4TOTAL, FREET4, T3FREE, THYROIDAB in the last 72 hours. Anemia Panel: No results for input(s): VITAMINB12, FOLATE, FERRITIN, TIBC, IRON, RETICCTPCT in the last 72 hours. Sepsis Labs: Recent Labs  Lab 02/12/23 1857  LATICACIDVEN 1.9    Recent Results (from the past 240 hours)  Resp panel by RT-PCR (RSV, Flu A&B, Covid) Anterior Nasal Swab     Status: None   Collection Time: 02/12/23 11:03 AM   Specimen: Anterior Nasal Swab  Result Value Ref Range Status   SARS Coronavirus 2 by RT PCR NEGATIVE NEGATIVE Final   Influenza A by PCR NEGATIVE NEGATIVE Final    Influenza B by PCR NEGATIVE NEGATIVE Final    Comment: (NOTE) The Xpert Xpress SARS-CoV-2/FLU/RSV plus assay is intended as an aid in the diagnosis of influenza from Nasopharyngeal swab specimens and should not be used as a sole basis for treatment. Nasal washings and aspirates are unacceptable for Xpert Xpress SARS-CoV-2/FLU/RSV testing.  Fact Sheet for Patients: bloggercourse.com  Fact Sheet for Healthcare Providers: seriousbroker.it  This test is not yet approved or cleared by the [place]  FDA and has been authorized for detection and/or diagnosis of SARS-CoV-2 by FDA under an Emergency Use Authorization (EUA). This EUA will remain in effect (meaning this test can be used) for the duration of the COVID-19 declaration under Section 564(b)(1) of the Act, 21 U.S.C. section 360bbb-3(b)(1), unless the authorization is terminated or revoked.     Resp Syncytial Virus by PCR NEGATIVE NEGATIVE Final    Comment: (NOTE) Fact Sheet for Patients: bloggercourse.com  Fact Sheet for Healthcare Providers: seriousbroker.it  This test is not yet approved or cleared by the [place]  FDA and has been authorized for detection and/or diagnosis of SARS-CoV-2 by FDA under  an Emergency Use Authorization (EUA). This EUA will remain in effect (meaning this test can be used) for the duration of the COVID-19 declaration under Section 564(b)(1) of the Act, 21 U.S.C. section 360bbb-3(b)(1), unless the authorization is terminated or revoked.  Performed at [facility], [address] [place], [place] [address]          Radiology Studies: DG Chest 2 View Result Date: 02/12/2023 CLINICAL DATA:  Cough, stage IV liver cancer EXAM: CHEST - 2 VIEW COMPARISON:  07/08/2018 chest radiograph. FINDINGS: Right Port-A-Cath terminates at the cavoatrial junction. Stable cardiomediastinal silhouette  with normal heart size. No pneumothorax. Small bilateral pleural effusions, right greater than left. No overt pulmonary edema. Mild right basilar atelectasis. IMPRESSION: Small bilateral pleural effusions, right greater than left. Mild right basilar atelectasis. Electronically Signed   By: Selinda DELENA Blue M.D.   On: 02/12/2023 12:06   CT ABDOMEN PELVIS W CONTRAST Result Date: 02/12/2023 CLINICAL DATA:  Worsening abdominal pain with nausea and vomiting. History of metastatic HCC. EXAM: CT ABDOMEN AND PELVIS WITH CONTRAST TECHNIQUE: Multidetector CT imaging of the abdomen and pelvis was performed using the standard protocol following bolus administration of intravenous contrast. RADIATION DOSE REDUCTION: This exam was performed according to the departmental dose-optimization program which includes automated exposure control, adjustment of the mA and/or kV according to patient size and/or use of iterative reconstruction technique. CONTRAST:  75mL OMNIPAQUE  IOHEXOL  350 MG/ML SOLN COMPARISON:  CT chest dated January 05, 2023. MRI abdomen dated January 03, 2023. CT abdomen pelvis dated December 25, 2018. FINDINGS: Lower chest: Increasing small right pleural effusion. Unchanged 1 cm metastasis in the medial right lower lobe. Resolved trace left pleural effusion. Unchanged small pericardial effusion. Hepatobiliary: Numerous liver masses overall appear stable or decreased in size when compared to visualization on most recent chest CT from January 1st. For example, a central lesion previously measuring 3.4 cm currently measures 2.3 cm (series 3, image 12). 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 Proximal portal vein, splenic vein and SMV remain patent. Distended, thin walled gallbladder with several tiny gallstones again noted. Unchanged mild central intrahepatic and proximal common bile duct dilatation up to  11 mm. Small filling defect again noted in the distal common bile duct (series 3, image 34). Pancreas: Unremarkable. No pancreatic ductal dilatation or surrounding inflammatory changes. Spleen: Normal in size without focal abnormality. Adrenals/Urinary Tract: Adrenal glands are unremarkable. Kidneys are normal, without renal calculi, focal lesion, or hydronephrosis. Bladder is unremarkable. Stomach/Bowel: Unchanged small hiatal hernia. The stomach is otherwise within normal limits. Mild colonic wall thickening near the hepatic flexure is likely due to portal colopathy. No obstruction. Mild left-sided colonic diverticulosis. Vascular/Lymphatic: Portal thrombosis as described above. Aortoiliac atherosclerotic vascular disease. Unchanged enlarged portacaval lymph nodes measuring up to 1.2 cm. Reproductive: Uterus and bilateral adnexa are unremarkable. Other: New moderate ascites.  No pneumoperitoneum. Musculoskeletal: No acute or significant osseous findings. IMPRESSION: 1. Multifocal hepatocellular carcinoma involving both liver lobes with overall stable or decreased humerus size when compared to most recent chest CT. 2. 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 3. Unchanged mild central intrahepatic and proximal common bile duct dilatation up to 11 mm. Small filling defect again noted in the distal common bile duct, concerning for choledocholithiasis. 4. New moderate ascites. 5. Increasing small right  pleural effusion. Resolved trace left pleural effusion. Unchanged small pericardial effusion. 6.  Aortic Atherosclerosis (ICD10-I70.0). Electronically Signed   By: Elsie ONEIDA Shoulder M.D.   On: 02/12/2023 12:05        Scheduled Meds:  amLODipine   10 mg Oral Daily   [START ON 02/14/2023] apixaban   5 mg Oral BID   Chlorhexidine  Gluconate Cloth  6 each Topical Daily   cloNIDine   0.2  mg Oral BID   dorzolamide -timolol   1 drop Both Eyes BID   hydrALAZINE   50 mg Oral Q8H   megestrol   400 mg Oral Daily   mirtazapine   15 mg Oral QHS   ondansetron   4 mg Oral Once   sodium chloride  flush  3 mL Intravenous Q12H   spironolactone   25 mg Oral Daily   Continuous Infusions:  cefTRIAXone  (ROCEPHIN )  IV            Sophie Mao, MD [facility] 02/13/2023, 11:17 AM

## 2023-02-13 NOTE — Progress Notes (Signed)
 Pt off unit to IR at this time

## 2023-02-13 NOTE — Plan of Care (Signed)
  Problem: Education: Goal: Knowledge of General Education information will improve Description: Including pain rating scale, medication(s)/side effects and non-pharmacologic comfort measures Outcome: Progressing   Problem: Health Behavior/Discharge Planning: Goal: Ability to manage health-related needs will improve Outcome: Progressing   Problem: Clinical Measurements: Goal: Ability to maintain clinical measurements within normal limits will improve Outcome: Progressing   Problem: Activity: Goal: Risk for activity intolerance will decrease Outcome: Progressing   Problem: Nutrition: Goal: Adequate nutrition will be maintained Outcome: Progressing   Problem: Pain Managment: Goal: General experience of comfort will improve and/or be controlled Outcome: Progressing

## 2023-02-14 DIAGNOSIS — R197 Diarrhea, unspecified: Secondary | ICD-10-CM | POA: Diagnosis not present

## 2023-02-14 DIAGNOSIS — R112 Nausea with vomiting, unspecified: Secondary | ICD-10-CM | POA: Diagnosis not present

## 2023-02-14 LAB — GASTROINTESTINAL PANEL BY PCR, STOOL (REPLACES STOOL CULTURE)

## 2023-02-14 LAB — CBC WITH DIFFERENTIAL/PLATELET
Abs Immature Granulocytes: 0.07 10*3/uL (ref 0.00–0.07)
Basophils Absolute: 0 10*3/uL (ref 0.0–0.1)
Basophils Relative: 0 %
Eosinophils Absolute: 0.1 10*3/uL (ref 0.0–0.5)
Eosinophils Relative: 1 %
HCT: 36.8 % (ref 36.0–46.0)
Hemoglobin: 11.7 g/dL — ABNORMAL LOW (ref 12.0–15.0)
Immature Granulocytes: 1 %
Lymphocytes Relative: 9 %
Lymphs Abs: 1 10*3/uL (ref 0.7–4.0)
MCH: 27.1 pg (ref 26.0–34.0)
MCHC: 31.8 g/dL (ref 30.0–36.0)
MCV: 85.4 fL (ref 80.0–100.0)
Monocytes Absolute: 0.8 10*3/uL (ref 0.1–1.0)
Monocytes Relative: 8 %
Neutro Abs: 8.5 10*3/uL — ABNORMAL HIGH (ref 1.7–7.7)
Neutrophils Relative %: 81 %
Platelets: 241 10*3/uL (ref 150–400)
RBC: 4.31 MIL/uL (ref 3.87–5.11)
RDW: 20.5 % — ABNORMAL HIGH (ref 11.5–15.5)
WBC: 10.4 10*3/uL (ref 4.0–10.5)
nRBC: 0 % (ref 0.0–0.2)

## 2023-02-14 LAB — COMPREHENSIVE METABOLIC PANEL
ALT: 20 U/L (ref 0–44)
AST: 87 U/L — ABNORMAL HIGH (ref 15–41)
Albumin: 2.1 g/dL — ABNORMAL LOW (ref 3.5–5.0)
Alkaline Phosphatase: 229 U/L — ABNORMAL HIGH (ref 38–126)
Anion gap: 11 (ref 5–15)
BUN: 37 mg/dL — ABNORMAL HIGH (ref 8–23)
CO2: 20 mmol/L — ABNORMAL LOW (ref 22–32)
Calcium: 8 mg/dL — ABNORMAL LOW (ref 8.9–10.3)
Chloride: 111 mmol/L (ref 98–111)
Creatinine, Ser: 1.49 mg/dL — ABNORMAL HIGH (ref 0.44–1.00)
GFR, Estimated: 35 mL/min — ABNORMAL LOW (ref >60)
Glucose, Bld: 100 mg/dL — ABNORMAL HIGH (ref 70–99)
Potassium: 4 mmol/L (ref 3.5–5.1)
Sodium: 142 mmol/L (ref 135–145)
Total Bilirubin: 0.8 mg/dL (ref 0.0–1.2)
Total Protein: 5.2 g/dL — ABNORMAL LOW (ref 6.5–8.1)

## 2023-02-14 LAB — MAGNESIUM: Magnesium: 1.9 mg/dL (ref 1.7–2.4)

## 2023-02-14 NOTE — Progress Notes (Signed)
 PROGRESS NOTE    BRIN HAUPTMAN  EAV:[id number] DOB: 1940-08-04 DOA: 02/12/2023 PCP: Albertha Huger, FNP   Brief Narrative:  83 y.o. female with past medical history  of hepatocellular cancer, portal vein thrombosis, allergy to HCTZ and lisinopril , diabetes mellitus type 2, essential hypertension, history of pancreatitis, history of cirrhosis presented with worsening abdominal pain and increasing abdominal swelling.  On presentation, CT of abdomen showed multifocal hepatocellular carcinoma in both lobes of the liver, portal vein thrombosis, progressive expansile tumor thrombosis in the right portal vein and portal bifurcation, new moderate ascites, increasing small right pleural effusion, unchanged pericardial effusion.  Creatinine was 1.59.  WBCs of 20.1.  Influenza/RSV/COVID PCR negative.  She was started on IV antibiotics.  She underwent paracentesis by IR on 02/13/2023 and 1.1 L of clear fluid removed.  Assessment & Plan:   Severe abdominal pain possibly due to worsening ascites, rule out SBP Multifocal hepatocellular carcinoma Portal vein thrombosis Elevated LFTs Goals of care -Imaging as above.  She underwent paracentesis by IR on 02/13/2023 and 1.1 L of clear fluid removed.  Urinalysis not consistent with SBP.  DC Rocephin . -Spironolactone  held on 02/13/2023 due to acute kidney injury.  Strict input output.  Daily weights.  IV fluids plan as below. -Outpatient follow-up with oncology.  Patient has had a new diagnosis of hepatocellular cancer since December 2024.  Patient is receiving immunotherapy -Overall gnosis is guarded to poor.  Currently listed as full code.  Palliative care consultation is pending. -Eliquis  resumed after paracentesis on 02/13/2023 -Advance diet to soft diet today  Chronic diastolic heart failure Hypertension -Blood pressure intermittently elevated.  Continue amlodipine  and clonidine .  Spironolactone  on hold.  Monitor blood pressure.  Strict input output.  Daily  weights.  Leukocytosis -Labs pending today  Diabetes mellitus type 2 with hypoglycemia -Metformin  on hold.   Monitor  Acute kidney injury -Creatinine worsening to 1.67 on 02/13/2023.  Labs pending today.  Monitor.  On gentle hydration  DVT prophylaxis: Eliquis   Code Status: Full Family Communication: None at bedside Disposition Plan: Status is:  inpatient because: Of severity of illness  Consultants: palliative care  Procedures: As above Antimicrobials: Rocephin  from 02/12/2023 onwards   Subjective: Patient seen and examined at bedside.  Still having intermittent abdominal pain but improving after paracentesis.  Oral intake remains poor. No fever, vomiting or agitation reported  Objective: Vitals:   02/13/23 2129 02/14/23 0129 02/14/23 0535 02/14/23 0538  BP: (!) 120/52 (!) 150/55 (!) 152/72 (!) 152/72  Pulse:  74  75  Resp:  19  19  Temp:  98.3 F (36.8 C)  98.1 F (36.7 C)  TempSrc:  Oral  Oral  SpO2:  95%  95%  Weight:  60.4 kg    Height:        Intake/Output Summary (Last 24 hours) at 02/14/2023 0654 Last data filed at 02/14/2023 0553 Gross per 24 hour  Intake 1053.32 ml  Output --  Net 1053.32 ml   Filed Weights   02/12/23 1057 02/13/23 0500 02/14/23 0129  Weight: 61.2 kg 59.9 kg 60.4 kg    Examination:  General: On room air currently.  No distress.  Chronically ill and deconditioned looking. ENT/neck: No thyromegaly.  JVD is not elevated  respiratory: Decreased breath sounds at bases bilaterally with some crackles; no wheezing  CVS: S1-S2 heard, rate controlled currently Abdominal: Soft, mildly tender, slightly less distended; no organomegaly, bowel sounds are heard Extremities: Mild lower extremity edema; no cyanosis  CNS: Awake and  alert.  Still slow to respond and a poor historian.  No focal neurologic deficit.  Moves extremities Lymph: No obvious lymphadenopathy Skin: No obvious ecchymosis/lesions  psych: Extremely flat affect.  Not agitated  currently.   Musculoskeletal: No obvious joint swelling/deformity     Data Reviewed: I have personally reviewed following labs and imaging studies  CBC: Recent Labs  Lab 02/12/23 1110 02/13/23 0609  WBC 20.1* 13.8*  NEUTROABS 16.8*  --   HGB 13.8 12.1  HCT 44.1 38.4  MCV 86.3 86.5  PLT 506* 312   Basic Metabolic Panel: Recent Labs  Lab 02/12/23 1110 02/13/23 0609 02/13/23 0857  NA 141 141  --   K 5.0 4.9  --   CL 108 109  --   CO2 19* 20*  --   GLUCOSE 101* 57* 57*  BUN 31* 32*  --   CREATININE 1.59* 1.67*  --   CALCIUM  8.5* 8.2*  --    GFR: Estimated Creatinine Clearance: 21.5 mL/min (A) (by C-G formula based on SCr of 1.67 mg/dL (H)). Liver Function Tests: Recent Labs  Lab 02/12/23 1110 02/13/23 0609  AST 76* 83*  ALT 20 19  ALKPHOS 226* 237*  BILITOT 1.4* 1.0  PROT 5.8* 5.4*  ALBUMIN 2.5* 2.3*   Recent Labs  Lab 02/12/23 1110  LIPASE 51   Recent Labs  Lab 02/12/23 1420  AMMONIA 28   Coagulation Profile: Recent Labs  Lab 02/12/23 1420 02/13/23 0609  INR 1.5* 1.4*   Cardiac Enzymes: No results for input(s): "CKTOTAL", "CKMB", "CKMBINDEX", "TROPONINI" in the last 168 hours. BNP (last 3 results) No results for input(s): "PROBNP" in the last 8760 hours. HbA1C: No results for input(s): "HGBA1C" in the last 72 hours. CBG: Recent Labs  Lab 02/13/23 0831 02/13/23 0837 02/13/23 0914  GLUCAP 27* 57* 177*   Lipid Profile: No results for input(s): "CHOL", "HDL", "LDLCALC", "TRIG", "CHOLHDL", "LDLDIRECT" in the last 72 hours. Thyroid  Function Tests: No results for input(s): "TSH", "T4TOTAL", "FREET4", "T3FREE", "THYROIDAB" in the last 72 hours. Anemia Panel: No results for input(s): "VITAMINB12", "FOLATE", "FERRITIN", "TIBC", "IRON", "RETICCTPCT" in the last 72 hours. Sepsis Labs: Recent Labs  Lab 02/12/23 1857  LATICACIDVEN 1.9    Recent Results (from the past 240 hours)  Resp panel by RT-PCR (RSV, Flu A&B, Covid) Anterior Nasal Swab      Status: None   Collection Time: 02/12/23 11:03 AM   Specimen: Anterior Nasal Swab  Result Value Ref Range Status   SARS Coronavirus 2 by RT PCR NEGATIVE NEGATIVE Final   Influenza A by PCR NEGATIVE NEGATIVE Final   Influenza B by PCR NEGATIVE NEGATIVE Final    Comment: (NOTE) The Xpert Xpress SARS-CoV-2/FLU/RSV plus assay is intended as an aid in the diagnosis of influenza from Nasopharyngeal swab specimens and should not be used as a sole basis for treatment. Nasal washings and aspirates are unacceptable for Xpert Xpress SARS-CoV-2/FLU/RSV testing.  Fact Sheet for Patients: BloggerCourse.com  Fact Sheet for Healthcare Providers: SeriousBroker.it  This test is not yet approved or cleared by the [place]  FDA and has been authorized for detection and/or diagnosis of SARS-CoV-2 by FDA under an Emergency Use Authorization (EUA). This EUA will remain in effect (meaning this test can be used) for the duration of the COVID-19 declaration under Section 564(b)(1) of the Act, 21 U.S.C. section 360bbb-3(b)(1), unless the authorization is terminated or revoked.     Resp Syncytial Virus by PCR NEGATIVE NEGATIVE Final    Comment: (NOTE) Fact  Sheet for Patients: BloggerCourse.com  Fact Sheet for Healthcare Providers: SeriousBroker.it  This test is not yet approved or cleared by the [place]  FDA and has been authorized for detection and/or diagnosis of SARS-CoV-2 by FDA under an Emergency Use Authorization (EUA). This EUA will remain in effect (meaning this test can be used) for the duration of the COVID-19 declaration under Section 564(b)(1) of the Act, 21 U.S.C. section 360bbb-3(b)(1), unless the authorization is terminated or revoked.  Performed at [facility], [address], [place], [place] [address]   Gastrointestinal Panel by PCR , Stool     Status: None    Collection Time: 02/12/23  8:03 PM   Specimen: Stool  Result Value Ref Range Status   Campylobacter species NOT DETECTED NOT DETECTED Final   Plesimonas shigelloides NOT DETECTED NOT DETECTED Final   Salmonella species NOT DETECTED NOT DETECTED Final   Yersinia enterocolitica NOT DETECTED NOT DETECTED Final   Vibrio species NOT DETECTED NOT DETECTED Final   Vibrio cholerae NOT DETECTED NOT DETECTED Final   Enteroaggregative E coli (EAEC) NOT DETECTED NOT DETECTED Final   Enteropathogenic E coli (EPEC) NOT DETECTED NOT DETECTED Final   Enterotoxigenic E coli (ETEC) NOT DETECTED NOT DETECTED Final   Shiga like toxin producing E coli (STEC) NOT DETECTED NOT DETECTED Final   Shigella/Enteroinvasive E coli (EIEC) NOT DETECTED NOT DETECTED Final   Cryptosporidium NOT DETECTED NOT DETECTED Final   Cyclospora cayetanensis NOT DETECTED NOT DETECTED Final   Entamoeba histolytica NOT DETECTED NOT DETECTED Final   Giardia lamblia NOT DETECTED NOT DETECTED Final   Adenovirus F40/41 NOT DETECTED NOT DETECTED Final   Astrovirus NOT DETECTED NOT DETECTED Final   Norovirus GI/GII NOT DETECTED NOT DETECTED Final   Rotavirus A NOT DETECTED NOT DETECTED Final   Sapovirus (I, II, IV, and V) NOT DETECTED NOT DETECTED Final    Comment: Performed at [facility], [address] [place], [place] [address]  Gram stain     Status: None   Collection Time: 02/13/23 10:50 AM   Specimen: Peritoneal Washings  Result Value Ref Range Status   Specimen Description PERITONEAL  Final   Special Requests NONE  Final   Gram Stain   Final    WBC SEEN NO ORGANISMS SEEN CYTOSPIN SMEAR Performed at [facility], [address], [place], [place] [address]    Report Status 02/13/2023 FINAL  Final         Radiology Studies: US  Paracentesis Result Date: 02/13/2023 INDICATION: Patient with a history of hepatocellular carcinoma presents with ascites. Interventional radiology asked to perform a diagnostic  and therapeutic paracentesis. EXAM: ULTRASOUND GUIDED PARACENTESIS MEDICATIONS: 1% lidocaine  10 mL COMPLICATIONS: None immediate. PROCEDURE: Informed written consent was obtained from the patient after a discussion of the risks, benefits and alternatives to treatment. A timeout was performed prior to the initiation of the procedure. Initial ultrasound scanning demonstrates a large amount of ascites within the right upper abdominal quadrant. The right upper abdomen was prepped and draped in the usual sterile fashion. 1% lidocaine  was used for local anesthesia. Following this, a 19 gauge, 7-cm, Yueh catheter was introduced. An ultrasound image was saved for documentation purposes. The paracentesis was performed. The catheter was removed and a dressing was applied. The patient tolerated the procedure well without immediate post procedural complication. FINDINGS: A total of approximately 1.1 L of clear yellow fluid was removed. Samples were sent to the laboratory as requested by the clinical team. IMPRESSION:  Successful ultrasound-guided paracentesis yielding 1.1 liters of peritoneal fluid. Procedure performed by Jetta Morrow NP Electronically Signed   By: Fernando Hoyer M.D.   On: 02/13/2023 11:49   DG Chest 2 View Result Date: 02/12/2023 CLINICAL DATA:  Cough, stage IV liver cancer EXAM: CHEST - 2 VIEW COMPARISON:  07/08/2018 chest radiograph. FINDINGS: Right Port-A-Cath terminates at the cavoatrial junction. Stable cardiomediastinal silhouette with normal heart size. No pneumothorax. Small bilateral pleural effusions, right greater than left. No overt pulmonary edema. Mild right basilar atelectasis. IMPRESSION: Small bilateral pleural effusions, right greater than left. Mild right basilar atelectasis. Electronically Signed   By: Levell Reach M.D.   On: 02/12/2023 12:06   CT ABDOMEN PELVIS W CONTRAST Result Date: 02/12/2023 CLINICAL DATA:  Worsening abdominal pain with nausea and vomiting. History of  metastatic HCC. EXAM: CT ABDOMEN AND PELVIS WITH CONTRAST TECHNIQUE: Multidetector CT imaging of the abdomen and pelvis was performed using the standard protocol following bolus administration of intravenous contrast. RADIATION DOSE REDUCTION: This exam was performed according to the departmental dose-optimization program which includes automated exposure control, adjustment of the mA and/or kV according to patient size and/or use of iterative reconstruction technique. CONTRAST:  75mL OMNIPAQUE  IOHEXOL  350 MG/ML SOLN COMPARISON:  CT chest dated January 05, 2023. MRI abdomen dated January 03, 2023. CT abdomen pelvis dated December 25, 2018. FINDINGS: Lower chest: Increasing small right pleural effusion. Unchanged 1 cm metastasis in the medial right lower lobe. Resolved trace left pleural effusion. Unchanged small pericardial effusion. Hepatobiliary: Numerous liver masses overall appear stable or decreased in size when compared to visualization on most recent chest CT from January 1st. For example, a central lesion previously measuring 3.4 cm currently measures 2.3 cm (series 3, image 12). 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 Proximal portal vein, splenic vein and SMV remain patent. Distended, thin walled gallbladder with several tiny gallstones again noted. Unchanged mild central intrahepatic and proximal common bile duct dilatation up to 11 mm. Small filling defect again noted in the distal common bile duct (series 3, image 34). Pancreas: Unremarkable. No pancreatic ductal dilatation or surrounding inflammatory changes. Spleen: Normal in size without focal abnormality. Adrenals/Urinary Tract: Adrenal glands are unremarkable. Kidneys are normal, without renal calculi, focal lesion, or hydronephrosis. Bladder is unremarkable. Stomach/Bowel: Unchanged small hiatal hernia. The stomach is otherwise  within normal limits. Mild colonic wall thickening near the hepatic flexure is likely due to portal colopathy. No obstruction. Mild left-sided colonic diverticulosis. Vascular/Lymphatic: Portal thrombosis as described above. Aortoiliac atherosclerotic vascular disease. Unchanged enlarged portacaval lymph nodes measuring up to 1.2 cm. Reproductive: Uterus and bilateral adnexa are unremarkable. Other: New moderate ascites.  No pneumoperitoneum. Musculoskeletal: No acute or significant osseous findings. IMPRESSION: 1. Multifocal hepatocellular carcinoma involving both liver lobes with overall stable or decreased humerus size when compared to most recent chest CT. 2. 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 3. Unchanged mild central intrahepatic and proximal common bile duct dilatation up to 11 mm. Small filling defect again noted in the distal common bile duct, concerning for choledocholithiasis. 4. New moderate ascites. 5. Increasing small right pleural effusion. Resolved trace left pleural effusion. Unchanged small pericardial effusion. 6.  Aortic Atherosclerosis (ICD10-I70.0). Electronically Signed   By: Aleta Anda M.D.   On: 02/12/2023 12:05  Scheduled Meds:  amLODipine   10 mg Oral Daily   apixaban   5 mg Oral BID   Chlorhexidine  Gluconate Cloth  6 each Topical Daily   cloNIDine   0.2 mg Oral BID   dorzolamide -timolol   1 drop Both Eyes BID   hydrALAZINE   50 mg Oral Q8H   megestrol   400 mg Oral Daily   mirtazapine   15 mg Oral QHS   ondansetron   4 mg Oral Once   sodium chloride  flush  3 mL Intravenous Q12H   Continuous Infusions:  sodium chloride  75 mL/hr at 02/14/23 0553   cefTRIAXone  (ROCEPHIN )  IV Stopped (02/13/23 1637)          Audria Leather, MD [facility] 02/14/2023, 6:54 AM

## 2023-02-14 NOTE — Plan of Care (Signed)

## 2023-02-14 NOTE — Progress Notes (Signed)
 Transition of Care [facility]) - Inpatient Brief Assessment   Patient Details  Name: Heather Oconnor MRN: [id number] Date of Birth: 1940-11-22  Transition of Care [facility]) CM/SW Contact:    Dane Dung, RN Phone Number: 02/14/2023, 4:10 PM   Clinical Narrative: CM met with the patient at the bedside to discuss TOC needs.  The patient admitted to the hospital for cirrhosis and plans to return home with home health services - likely tomorrow.  Patient states that she is currently active with [facility].  No other TOC needs and patient should return home when medically stable for discharge.   Transition of Care Asessment: Insurance and Status: (P) Insurance coverage has been reviewed Patient has primary care physician: (P) Yes Home environment has been reviewed: (P) from home alone Prior level of function:: (P) RW at home - Active with [facility] Prior/Current Home Services: (P) Current home services (Active with [facility]) Social Drivers of Health Review: (P) SDOH reviewed interventions complete Readmission risk has been reviewed: (P) Yes Transition of care needs: (P) transition of care needs identified, TOC will continue to follow

## 2023-02-14 NOTE — Evaluation (Signed)
 Physical Therapy Evaluation Patient Details Name: Heather Oconnor MRN: [id number] DOB: 24-May-1940 Today's Date: 02/14/2023  History of Present Illness  83 y.o. female presents to [facility] hospital on 02/12/2023 with worsening abdominal pain and swelling. CT abdomen shows multifocal hepatocellular carcinoma. Paracentesis removing 1.1L on 2/9. PMH: CVA on Plavix , IBS, history of pancreatic pseudocysts, HLD, HTN, HFpEF, COPD, CKD stage 3b, H/o cirrhosis.  Clinical Impression  Pt presents to PT with deficits in activity tolerance and strength. Pt is able to ambulate with support of a RW for household distances. Pt is eager to discharge home. Pt has necessary PRN assistance from her daughter. PT recommends continued HHPT at the time of discharge.        If plan is discharge home, recommend the following: A little help with bathing/dressing/bathroom;Assistance with cooking/housework;Assist for transportation   Can travel by private vehicle        Equipment Recommendations None recommended by PT  Recommendations for Other Services       Functional Status Assessment Patient has had a recent decline in their functional status and demonstrates the ability to make significant improvements in function in a reasonable and predictable amount of time.     Precautions / Restrictions Precautions Precautions: None Restrictions Weight Bearing Restrictions Per Provider Order: No      Mobility  Bed Mobility Overal bed mobility: Needs Assistance Bed Mobility: Supine to Sit, Sit to Supine     Supine to sit: Modified independent (Device/Increase time) Sit to supine: Contact guard assist        Transfers Overall transfer level: Needs assistance Equipment used: Rolling walker (2 wheels) Transfers: Sit to/from Stand Sit to Stand: Supervision                Ambulation/Gait Ambulation/Gait assistance: Supervision Gait Distance (Feet): 150 Feet Assistive device: Rolling walker (2  wheels) Gait Pattern/deviations: Step-through pattern Gait velocity: reduced Gait velocity interpretation: <1.8 ft/sec, indicate of risk for recurrent falls   General Gait Details: slowed step-through gait  Stairs            Wheelchair Mobility     Tilt Bed    Modified Rankin (Stroke Patients Only)       Balance Overall balance assessment: Needs assistance Sitting-balance support: No upper extremity supported, Feet supported Sitting balance-Leahy Scale: Good     Standing balance support: Reliant on assistive device for balance, Single extremity supported Standing balance-Leahy Scale: Poor                               Pertinent Vitals/Pain Pain Assessment Pain Assessment: No/denies pain    Home Living Family/patient expects to be discharged to:: Private residence Living Arrangements: Alone Available Help at Discharge: Family;Available PRN/intermittently Type of Home: House Home Access: Level entry       Home Layout: One level Home Equipment: Agricultural consultant (2 wheels);Shower seat      Prior Function Prior Level of Function : Independent/Modified Independent;Driving             Mobility Comments: ambulatory with RW ADLs Comments: daughter assists with meals at times     Extremity/Trunk Assessment   Upper Extremity Assessment Upper Extremity Assessment: Overall WFL for tasks assessed    Lower Extremity Assessment Lower Extremity Assessment: Generalized weakness    Cervical / Trunk Assessment Cervical / Trunk Assessment: Kyphotic  Communication   Communication Communication: No apparent difficulties Cueing Techniques: Verbal cues  Cognition Arousal: Alert  Behavior During Therapy: WFL for tasks assessed/performed Overall Cognitive Status: Within Functional Limits for tasks assessed                                          General Comments General comments (skin integrity, edema, etc.): VSS on RA     Exercises     Assessment/Plan    PT Assessment Patient needs continued PT services  PT Problem List Decreased strength;Decreased activity tolerance;Decreased balance;Decreased mobility       PT Treatment Interventions DME instruction;Gait training;Functional mobility training;Therapeutic activities;Therapeutic exercise;Balance training;Neuromuscular re-education;Patient/family education    PT Goals (Current goals can be found in the Care Plan section)  Acute Rehab PT Goals Patient Stated Goal: to go home PT Goal Formulation: With patient Time For Goal Achievement: 02/28/23 Potential to Achieve Goals: Good    Frequency Min 1X/week     Co-evaluation               AM-PAC PT "6 Clicks" Mobility  Outcome Measure Help needed turning from your back to your side while in a flat bed without using bedrails?: None Help needed moving from lying on your back to sitting on the side of a flat bed without using bedrails?: None Help needed moving to and from a bed to a chair (including a wheelchair)?: A Little Help needed standing up from a chair using your arms (e.g., wheelchair or bedside chair)?: A Little Help needed to walk in hospital room?: A Little Help needed climbing 3-5 steps with a railing? : A Little 6 Click Score: 20    End of Session   Activity Tolerance: Patient tolerated treatment well Patient left: in bed;with call bell/phone within reach Nurse Communication: Mobility status PT Visit Diagnosis: Other abnormalities of gait and mobility (R26.89);Muscle weakness (generalized) (M62.81)    Time: 1120-1140 PT Time Calculation (min) (ACUTE ONLY): 20 min   Charges:   PT Evaluation $PT Eval Low Complexity: 1 Low   PT General Charges $$ ACUTE PT VISIT: 1 Visit         Rexie Catena, PT, DPT Acute Rehabilitation Office [phone]   Rexie Catena 02/14/2023, 12:34 PM

## 2023-02-14 NOTE — Progress Notes (Signed)
Palliative Medicine [facility]  [phone]) [phone] [phone]   Name: Heather Oconnor Date: 02/14/2023 MRN: [id number]  DOB: 1940-03-13  Patient Care Team: Gabriel Earing, FNP as PCP - General (Family Medicine) Rollene Rotunda, MD as PCP - Cardiology (Cardiology) Jena Gauss Gerrit Friends, MD as Consulting Physician (Gastroenterology) Bond, Doran Stabler, MD as Consulting Physician (Ophthalmology) Smitty Cords, OD (Optometry) Pickenpack-Cousar, Arty Baumgartner, NP as Nurse Practitioner [facility] and Palliative Medicine)   INTERVAL HISTORY: Heather Oconnor is a 83 y.o. female with oncologic medical history including newly diagnosed  hepatocellular carcinoma (01/2023). Past history also includes resistant HTN, CVA, CAD, T2DM, GERD, and IBS. Palliative ask to see for symptom management and goals of care.   SOCIAL HISTORY:     reports that she quit smoking about 16 years ago. Her smoking use included cigarettes. She started smoking about 25 years ago. She has never used smokeless tobacco. She reports that she does not drink alcohol and does not use drugs.  ADVANCE DIRECTIVES:  None on file  CODE STATUS: DNR  PAST MEDICAL HISTORY: 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   Qualifier: Diagnosis of  By: Diana Eves     Pancreatitis    Transient cerebral ischemia 05/08/2008   Qualifier: Diagnosis of  By: Diana Eves      ALLERGIES:  is allergic to hctz [hydrochlorothiazide] and lisinopril.  MEDICATIONS:  No current facility-administered medications for this visit.   No current outpatient medications on file.   Facility-Administered Medications Ordered in Other Visits  Medication Dose Route  Frequency Provider Last Rate Last Admin   0.9 %  sodium chloride infusion   Intravenous Continuous Glade Lloyd, MD 50 mL/hr at 02/14/23 0955 Rate Change at 02/14/23 0955   acetaminophen (TYLENOL) tablet 650 mg  650 mg Oral Q6H PRN Gertha Calkin, MD       Or   acetaminophen (TYLENOL) suppository 650 mg  650 mg Rectal Q6H PRN Gertha Calkin, MD       amLODipine (NORVASC) tablet 10 mg  10 mg Oral Daily Gertha Calkin, MD   10 mg at 02/14/23 0956   apixaban (ELIQUIS) tablet 5 mg  5 mg Oral BID Gertha Calkin, MD   5 mg at 02/14/23 1478   Chlorhexidine Gluconate Cloth 2 % PADS 6 each  6 each Topical Daily Gertha Calkin, MD   6 each at 02/14/23 1006   cloNIDine (CATAPRES) tablet 0.2 mg  0.2 mg Oral BID Irena Cords V, MD   0.2 mg at 02/14/23 0956   dorzolamide-timolol (COSOPT) 2-0.5 % ophthalmic solution 1 drop  1 drop Both Eyes BID Irena Cords V, MD   1 drop at 02/14/23 1004   hydrALAZINE (APRESOLINE) tablet 50 mg  50 mg Oral Q8H Irena Cords V, MD   50 mg at 02/14/23 0535   megestrol (MEGACE) 400 MG/10ML suspension 400 mg  400 mg Oral Daily Hammons, Kimberly B, RPH   400 mg at 02/14/23 0956   mirtazapine (REMERON) tablet 15 mg  15 mg Oral QHS Gertha Calkin, MD   15 mg at 02/13/23 2129   morphine (PF) 2 MG/ML injection 2  mg  2 mg Intravenous Q4H PRN Gertha Calkin, MD       ondansetron (ZOFRAN-ODT) disintegrating tablet 4 mg  4 mg Oral Once Dolphus Jenny, PA-C       sodium chloride flush (NS) 0.9 % injection 3 mL  3 mL Intravenous Q12H Gertha Calkin, MD   3 mL at 02/14/23 1005    VITAL SIGNS: There were no vitals taken for this visit. There were no vitals filed for this visit.  Estimated body mass index is 23.59 kg/m as calculated from the following:   Height as of 02/12/23: 5\' 3"  (1.6 m).   Weight as of 02/14/23: 133 lb 2.5 oz (60.4 kg).   PERFORMANCE STATUS (ECOG) : 1 - Symptomatic but completely ambulatory  Assessment NAD RRR Normal breathing pattern Abdominal distention and tenderness AAO  x3  IMPRESSION:  I saw Heather Oconnor during her infusion. She endorses ongoing fatigue. Complaining of abdominal swelling and pain. She is accompanied by her daughter. Denies nausea, vomiting, constipation, or diarrhea.   She has been experiencing swelling in her feet and cold hands since returning from the hospital. Previously, she was on a diuretic, which was discontinued upon discharge, along her blood pressure medications recently due to hypotension and dehydration. We discussed continuing to keep legs elevated with resting at home. She knows to contact the office with worsening symptoms.   Her appetite has been poor, and she has been consuming three to four frozen [place] ice daily, which she finds palatable. She experiences a dry mouth and a metallic taste, which she attributes to her cancer. She has been using candies and other remedies to alleviate these symptoms. We discussed continued use of lemon, ginger, and cinnamon candies as needed as these help her the most. States recently she has had desire for buttermilk. She is able to take her medications with it also. Unable to tolerate Boost or Ensure as she feels this is "gritty" leaving feelings of sand in her mouth. Samples of premier protein drink provided as she reports her favorite flavor is strawberry. Her weight today is 137lbs. Feels her appetite is slowly returning.  Heather Oconnor reports that she is experiencing abdominal swelling and pain, which have worsened today. Her abdomen is noticeably more swollen, and the pain intensifies when sitting up compared to lying down. She recently underwent a paracentesis on 2/9 yielding 1.1L, which initially provided relief, but the symptoms have since returned. She inquires of why she is having to repeat paracentesis asking, "am I getting worst?" Emotional support provided. We discussed her overall health and taking things one day at time allowing time to evaluate any response to current  treatment. Feels her pain is well controlled on Tramadol. Daughter reports patient is taking at least 3 times per day. No adjustments to regimen.   We will continue to closely monitor and support.  Assessment and Plan  Abdominal Distension   Increased abdominal swelling and discomfort. Recent paracentesis provided temporary relief. 2/9 removing 1.1L. Another paracentesis is being scheduled per Dr. Arlana Pouch.   -Continue monitoring abdominal distension and pain.   -Contact office with worsening symptoms -Tramadol 50mg  every 6 hours as needed.   Poor Appetite   Limited oral intake, but tolerating nutritional shakes.   -Continue with nutritional shakes as tolerated.   -Consider trying Premier Protein drinks for additional protein intake.    Lower Extremity Edema   Noted increased swelling in feet. Blood pressure medications recently adjusted.   -Advise to  keep legs elevated at home to help reduce swelling.    Dry Mouth   Complaints of dry mouth and metallic taste.   -Recommend use of dry mouth products like Biotene or Act mouth rinses/lozenges.   -Continue use of lemon, ginger, and cinnamon candies for taste alteration.    Follow-up   Continue to monitor symptoms and manage as she arises. -I will see patient back in 3-4 weeks.   Patient expressed understanding and was in agreement with this plan. She also understands that She can call the clinic at any time with any questions, concerns, or complaints.   Any controlled substances utilized were prescribed in the context of palliative care. PDMP has been reviewed.   Visit consisted of counseling and education dealing with the complex and emotionally intense issues of symptom management and palliative care in the setting of serious and potentially life-threatening illness.  Willette Alma, AGPCNP-BC  Palliative Medicine Team/[facility]

## 2023-02-15 ENCOUNTER — Inpatient Hospital Stay: Payer: Medicare HMO | Admitting: Dietician

## 2023-02-15 ENCOUNTER — Other Ambulatory Visit (HOSPITAL_COMMUNITY): Payer: Medicare HMO

## 2023-02-15 DIAGNOSIS — R112 Nausea with vomiting, unspecified: Secondary | ICD-10-CM | POA: Diagnosis not present

## 2023-02-15 DIAGNOSIS — R197 Diarrhea, unspecified: Secondary | ICD-10-CM | POA: Diagnosis not present

## 2023-02-15 LAB — CBC WITH DIFFERENTIAL/PLATELET
Abs Immature Granulocytes: 0.07 10*3/uL (ref 0.00–0.07)
Basophils Absolute: 0 10*3/uL (ref 0.0–0.1)
Basophils Relative: 0 %
Eosinophils Absolute: 0.1 10*3/uL (ref 0.0–0.5)
Eosinophils Relative: 1 %
HCT: 35.8 % — ABNORMAL LOW (ref 36.0–46.0)
Hemoglobin: 11.4 g/dL — ABNORMAL LOW (ref 12.0–15.0)
Immature Granulocytes: 1 %
Lymphocytes Relative: 10 %
Lymphs Abs: 1.1 10*3/uL (ref 0.7–4.0)
MCH: 27.5 pg (ref 26.0–34.0)
MCHC: 31.8 g/dL (ref 30.0–36.0)
MCV: 86.5 fL (ref 80.0–100.0)
Monocytes Absolute: 0.8 10*3/uL (ref 0.1–1.0)
Monocytes Relative: 8 %
Neutro Abs: 8.4 10*3/uL — ABNORMAL HIGH (ref 1.7–7.7)
Neutrophils Relative %: 80 %
Platelets: 195 10*3/uL (ref 150–400)
RBC: 4.14 MIL/uL (ref 3.87–5.11)
RDW: 20.9 % — ABNORMAL HIGH (ref 11.5–15.5)
WBC: 10.5 10*3/uL (ref 4.0–10.5)
nRBC: 0 % (ref 0.0–0.2)

## 2023-02-15 LAB — COMPREHENSIVE METABOLIC PANEL
ALT: 18 U/L (ref 0–44)
AST: 71 U/L — ABNORMAL HIGH (ref 15–41)
Albumin: 2.1 g/dL — ABNORMAL LOW (ref 3.5–5.0)
Alkaline Phosphatase: 214 U/L — ABNORMAL HIGH (ref 38–126)
Anion gap: 10 (ref 5–15)
BUN: 34 mg/dL — ABNORMAL HIGH (ref 8–23)
CO2: 19 mmol/L — ABNORMAL LOW (ref 22–32)
Calcium: 7.8 mg/dL — ABNORMAL LOW (ref 8.9–10.3)
Chloride: 112 mmol/L — ABNORMAL HIGH (ref 98–111)
Creatinine, Ser: 1.43 mg/dL — ABNORMAL HIGH (ref 0.44–1.00)
GFR, Estimated: 37 mL/min — ABNORMAL LOW (ref >60)
Glucose, Bld: 103 mg/dL — ABNORMAL HIGH (ref 70–99)
Potassium: 4 mmol/L (ref 3.5–5.1)
Sodium: 141 mmol/L (ref 135–145)
Total Bilirubin: 0.7 mg/dL (ref 0.0–1.2)
Total Protein: 4.9 g/dL — ABNORMAL LOW (ref 6.5–8.1)

## 2023-02-15 LAB — MAGNESIUM: Magnesium: 1.7 mg/dL (ref 1.7–2.4)

## 2023-02-15 LAB — CYTOLOGY - NON PAP

## 2023-02-15 MED ORDER — HYDRALAZINE HCL 100 MG PO TABS: 50.0000 mg | ORAL_TABLET | Freq: Three times a day (TID) | ORAL | Status: DC

## 2023-02-15 NOTE — Plan of Care (Signed)

## 2023-02-15 NOTE — Discharge Summary (Signed)
Physician Discharge Summary  Heather Oconnor NWG:[id number] DOB: [birth year] DOA: 02/12/2023  PCP: Gabriel Earing, FNP  Admit date: 02/12/2023 Discharge date: 02/15/2023  Admitted From: Home Disposition: Home  Recommendations for Outpatient Follow-up:  Follow up with PCP in 1 week with repeat CBC/CMP Outpatient follow-up with oncology and palliative care Follow up in ED if symptoms worsen or new appear   Home Health: Resumption of home health services Equipment/Devices: None  Discharge Condition: Guarded CODE STATUS: Full Diet recommendation: Heart healthy/carb modified/fluid restriction of up to 1500 cc a day  Brief/Interim Summary: 83 y.o. female with past medical history  of hepatocellular cancer, portal vein thrombosis, allergy to HCTZ and lisinopril, diabetes mellitus type 2, essential hypertension, history of pancreatitis, history of cirrhosis presented with worsening abdominal pain and increasing abdominal swelling.  On presentation, CT of abdomen showed multifocal hepatocellular carcinoma in both lobes of the liver, portal vein thrombosis, progressive expansile tumor thrombosis in the right portal vein and portal bifurcation, new moderate ascites, increasing small right pleural effusion, unchanged pericardial effusion.  Creatinine was 1.59.  WBCs of 20.1.  Influenza/RSV/COVID PCR negative.  She was started on IV antibiotics.  She underwent paracentesis by IR on 02/13/2023 and 1.1 L of clear fluid removed.  Subsequently, her condition has improved.  She feels much better and wants to go home today.  She will be discharged home today with outpatient follow-up with PCP/oncology/palliative care.  Discharge Diagnoses:   Severe abdominal pain possibly due to worsening ascites, rule out SBP Multifocal hepatocellular carcinoma Portal vein thrombosis Elevated LFTs Goals of care -Imaging as above.  She underwent paracentesis by IR on 02/13/2023 and 1.1 L of clear fluid removed.  Urinalysis  not consistent with SBP.  Dc'd Rocephin. -Spironolactone held on 02/13/2023 due to acute kidney injury.  Continue diet and fluid restriction. -Outpatient follow-up with oncology.  Patient has had a new diagnosis of hepatocellular cancer since December 2024.  Patient is receiving immunotherapy -Overall gnosis is guarded to poor.  Currently listed as full code.  Palliative care consultation is pending and she can follow-up with palliative care as an outpatient. -Eliquis resumed after paracentesis on 02/13/2023 -Currently tolerating soft diet and wants to go home today.  Discharge patient home today with outpatient follow-up with PCP/oncology/palliative care   Chronic diastolic heart failure Hypertension -Blood pressure intermittently elevated.  Continue amlodipine and clonidine along with lower dose of hydralazine.  Resume bisoprolol.  Spironolactone and valsartan to remain on hold.  Outpatient follow-up with PCP.  Continue diet and fluid restriction.    Leukocytosis -Resolved   Diabetes mellitus type 2 with hypoglycemia -Metformin on hold till reevaluation by PCP.   Admitted for diet.  Outpatient follow-up with PCP.  Acute kidney injury Acute metabolic acidosis: Mild -Creatinine worsening to 1.67 on 02/13/2023.  Treated with IV fluids.  Creatinine 1.43 today.  Outpatient follow-up.  Discharge Instructions  Discharge Instructions     Diet - low sodium heart healthy   Complete by: As directed    Diet Carb Modified   Complete by: As directed    Increase activity slowly   Complete by: As directed       Allergies as of 02/15/2023       Reactions   Hctz [hydrochlorothiazide] Other (See Comments)   Per GI may have caused pancreatitis. Dr. Christella Hartigan recommended never resuming.   Lisinopril Other (See Comments)   H/O pancreatitis.        Medication List     STOP taking these  medications    atorvastatin 40 MG tablet Commonly known as: LIPITOR   CertaVite/Antioxidants Tabs    metFORMIN 500 MG tablet Commonly known as: GLUCOPHAGE   spironolactone 25 MG tablet Commonly known as: ALDACTONE   valsartan 320 MG tablet Commonly known as: DIOVAN       TAKE these medications    amLODipine 10 MG tablet Commonly known as: NORVASC TAKE 1 TABLET EVERY DAY   apixaban 5 MG Tabs tablet Commonly known as: ELIQUIS Take 1 tablet (5 mg total) by mouth 2 (two) times daily.   bisoprolol 5 MG tablet Commonly known as: ZEBETA TAKE 1 AND 1/2 TABLETS EVERY DAY   cholestyramine 4 g packet Commonly known as: Questran Take 0.5-1 packets (2-4 g total) by mouth 2 (two) times daily.   cloNIDine 0.2 MG tablet Commonly known as: CATAPRES TAKE 1 TABLET TWICE DAILY   dorzolamide-timolol 2-0.5 % ophthalmic solution Commonly known as: COSOPT Place 1 drop into both eyes 2 (two) times daily.   hydrALAZINE 100 MG tablet Commonly known as: APRESOLINE Take 0.5 tablets (50 mg total) by mouth 3 (three) times daily. What changed: how much to take   lidocaine-prilocaine cream Commonly known as: EMLA Apply to affected area once What changed:  how much to take how to take this when to take this additional instructions   megestrol 625 MG/5ML suspension Commonly known as: MEGACE ES Take 5 mLs (625 mg total) by mouth daily.   mirtazapine 15 MG tablet Commonly known as: REMERON TAKE 1 TABLET AT BEDTIME   ondansetron 8 MG tablet Commonly known as: Zofran Take 1 tablet (8 mg total) by mouth every 8 (eight) hours as needed for nausea or vomiting.   pantoprazole 40 MG tablet Commonly known as: PROTONIX TAKE 1 TABLET EVERY DAY   prochlorperazine 10 MG tablet Commonly known as: COMPAZINE Take 1 tablet (10 mg total) by mouth every 6 (six) hours as needed for nausea or vomiting.   traMADol 50 MG tablet Commonly known as: ULTRAM Take 1 tablet (50 mg total) by mouth every 6 (six) hours as needed.   True Metrix Blood Glucose Test test strip Generic drug: glucose blood 1  each by Other route 4 (four) times daily as needed.   TRUEplus Lancets 33G Misc Test BS up to four times daily as needed Dx E11.9        Follow-up Information     [facility] Follow up.   Specialty: Home Health Services Why: [facility] will provide home health services.  They will call you in the next 24-48 hours to set up services. Contact information: [address] [place] [address] [phone]         Gabriel Earing, FNP. Schedule an appointment as soon as possible for a visit in 1 week(s).   Specialty: Family Medicine Why: With repeat CBC/CMP Contact information: [address] [place] [address] [phone]         [name], MD Follow up.   Specialty: Oncology Why: At earliest convenience Contact information: [address] [place] [address] [phone]                Allergies  Allergen Reactions   Hctz [Hydrochlorothiazide] Other (See Comments)    Per GI may have caused pancreatitis. Dr. Christella Hartigan recommended never resuming.   Lisinopril Other (See Comments)    H/O pancreatitis.    Consultations: Palliative care evaluation is pending   Procedures/Studies: US Paracentesis Result  Date: 02/13/2023 INDICATION: Patient with a history of hepatocellular carcinoma presents with ascites. Interventional radiology asked to perform a diagnostic and therapeutic paracentesis. EXAM: ULTRASOUND GUIDED PARACENTESIS MEDICATIONS: 1% lidocaine 10 mL COMPLICATIONS: None immediate. PROCEDURE: Informed written consent was obtained from the patient after a discussion of the risks, benefits and alternatives to treatment. A timeout was performed prior to the initiation of the procedure. Initial ultrasound scanning demonstrates a large amount of ascites within the right upper abdominal quadrant. The right upper abdomen was prepped and draped in the usual sterile fashion. 1% lidocaine was used for local anesthesia.  Following this, a 19 gauge, 7-cm, Yueh catheter was introduced. An ultrasound image was saved for documentation purposes. The paracentesis was performed. The catheter was removed and a dressing was applied. The patient tolerated the procedure well without immediate post procedural complication. FINDINGS: A total of approximately 1.1 L of clear yellow fluid was removed. Samples were sent to the laboratory as requested by the clinical team. IMPRESSION: Successful ultrasound-guided paracentesis yielding 1.1 liters of peritoneal fluid. Procedure performed by Alwyn Ren NP Electronically Signed   By: Malachy Moan M.D.   On: 02/13/2023 11:49   DG Chest 2 View Result Date: 02/12/2023 CLINICAL DATA:  Cough, stage IV liver cancer EXAM: CHEST - 2 VIEW COMPARISON:  07/08/2018 chest radiograph. FINDINGS: Right Port-A-Cath terminates at the cavoatrial junction. Stable cardiomediastinal silhouette with normal heart size. No pneumothorax. Small bilateral pleural effusions, right greater than left. No overt pulmonary edema. Mild right basilar atelectasis. IMPRESSION: Small bilateral pleural effusions, right greater than left. Mild right basilar atelectasis. Electronically Signed   By: Delbert Phenix M.D.   On: 02/12/2023 12:06   CT ABDOMEN PELVIS W CONTRAST Result Date: 02/12/2023 CLINICAL DATA:  Worsening abdominal pain with nausea and vomiting. History of metastatic HCC. EXAM: CT ABDOMEN AND PELVIS WITH CONTRAST TECHNIQUE: Multidetector CT imaging of the abdomen and pelvis was performed using the standard protocol following bolus administration of intravenous contrast. RADIATION DOSE REDUCTION: This exam was performed according to the departmental dose-optimization program which includes automated exposure control, adjustment of the mA and/or kV according to patient size and/or use of iterative reconstruction technique. CONTRAST:  75mL OMNIPAQUE IOHEXOL 350 MG/ML SOLN COMPARISON:  CT chest dated January 05, 2023. MRI  abdomen dated January 03, 2023. CT abdomen pelvis dated December 25, 2018. FINDINGS: Lower chest: Increasing small right pleural effusion. Unchanged 1 cm metastasis in the medial right lower lobe. Resolved trace left pleural effusion. Unchanged small pericardial effusion. Hepatobiliary: Numerous liver masses overall appear stable or decreased in size when compared to visualization on most recent chest CT from January 1st. For example, a central lesion previously measuring 3.4 cm currently measures 2.3 cm (series 3, image 12). 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 Proximal portal vein, splenic vein and SMV remain patent. Distended, thin walled gallbladder with several tiny gallstones again noted. Unchanged mild central intrahepatic and proximal common bile duct dilatation up to 11 mm. Small filling defect again noted in the distal common bile duct (series 3, image 34). Pancreas: Unremarkable. No pancreatic ductal dilatation or surrounding inflammatory changes. Spleen: Normal in size without focal abnormality. Adrenals/Urinary Tract: Adrenal glands are unremarkable. Kidneys are normal, without renal calculi, focal lesion, or hydronephrosis. Bladder is unremarkable. Stomach/Bowel: Unchanged small hiatal hernia. The stomach is otherwise within normal limits. Mild colonic wall thickening near the hepatic flexure is likely due to portal  colopathy. No obstruction. Mild left-sided colonic diverticulosis. Vascular/Lymphatic: Portal thrombosis as described above. Aortoiliac atherosclerotic vascular disease. Unchanged enlarged portacaval lymph nodes measuring up to 1.2 cm. Reproductive: Uterus and bilateral adnexa are unremarkable. Other: New moderate ascites.  No pneumoperitoneum. Musculoskeletal: No acute or significant osseous findings. IMPRESSION: 1. Multifocal hepatocellular carcinoma involving both  liver lobes with overall stable or decreased humerus size when compared to most recent chest CT. 2. 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 3. Unchanged mild central intrahepatic and proximal common bile duct dilatation up to 11 mm. Small filling defect again noted in the distal common bile duct, concerning for choledocholithiasis. 4. New moderate ascites. 5. Increasing small right pleural effusion. Resolved trace left pleural effusion. Unchanged small pericardial effusion. 6.  Aortic Atherosclerosis (ICD10-I70.0). Electronically Signed   By: Obie Dredge M.D.   On: 02/12/2023 12:05   [place] ASCITES (ABDOMEN LIMITED) Result Date: 02/11/2023 CLINICAL DATA:  83 year old female with sudden abdominal swelling. Malignant liver tumors. EXAM: LIMITED ABDOMEN ULTRASOUND FOR ASCITES TECHNIQUE: Limited ultrasound survey for ascites was performed in all four abdominal quadrants. COMPARISON:  Abdomen MRI 01/03/2023.  Chest CT 01/05/2023. FINDINGS: Grayscale images of the abdomen in all 4 quadrants now demonstrate a moderate to large volume of ascites in the right abdomen including surrounding the liver (image 4) and bowel loops in the right lower quadrant (image 5). Interestingly, there is little to no ascites fluid visible in the left abdomen. This might be related to patient positioning. Extremely heterogeneous liver echotexture consistent with extensive liver tumors again noted. IMPRESSION: 1. Moderate to large volume of ascites although limited to the right abdomen, increased from 01/03/23 and 01/05/23 imaging. 2. Extensive liver tumor again noted. Electronically Signed   By: Odessa Fleming M.D.   On: 02/11/2023 09:01   IR IMAGING GUIDED PORT INSERTION Result Date: 01/19/2023 INDICATION: History of multifocal hepatocellular carcinoma. In need of durable intravenous access for chemotherapy  administration. EXAM: IMPLANTED PORT A CATH PLACEMENT WITH ULTRASOUND AND FLUOROSCOPIC GUIDANCE COMPARISON:  Chest CT-01/05/2023 MEDICATIONS: None ANESTHESIA/SEDATION: Moderate (conscious) sedation was employed during this procedure as administered by the Interventional Radiology RN. A total of Benadryl 25 mg, Versed 2 mg and Fentanyl 100 mcg was administered intravenously. Moderate Sedation Time: 28 minutes. The patient's level of consciousness and vital signs were monitored continuously by radiology nursing throughout the procedure under my direct supervision. CONTRAST:  None FLUOROSCOPY TIME:  24 seconds (1 mGy) COMPLICATIONS: None immediate. PROCEDURE: The procedure, risks, benefits, and alternatives were explained to the patient. Questions regarding the procedure were encouraged and answered. The patient understands and consents to the procedure. The right neck and chest were prepped with chlorhexidine in a sterile fashion, and a sterile drape was applied covering the operative field. Maximum barrier sterile technique with sterile gowns and gloves were used for the procedure. A timeout was performed prior to the initiation of the procedure. Local anesthesia was provided with 1% lidocaine with epinephrine. After creating a small venotomy incision, a micropuncture kit was utilized to access the internal jugular vein. Real-time ultrasound guidance was utilized for vascular access including the acquisition of a permanent ultrasound image documenting patency of the accessed vessel. The microwire was utilized to measure appropriate catheter length. A subcutaneous port pocket was then created along the upper chest wall utilizing a combination of sharp and blunt dissection. The pocket was irrigated with sterile saline. A single  lumen clear view power injectable port was chosen for placement. The 8 Fr catheter was tunneled from the port pocket site to the venotomy incision. The port was placed in the pocket. The  external catheter was trimmed to appropriate length. At the venotomy, an 8 Fr peel-away sheath was placed over a guidewire under fluoroscopic guidance. The catheter was then placed through the sheath and the sheath was removed. Final catheter positioning was confirmed and documented with a fluoroscopic spot radiograph. The port was accessed with a Huber needle, aspirated and flushed with heparinized saline. The venotomy site was closed with an interrupted 4-0 Vicryl suture. The port pocket incision was closed with interrupted 2-0 Vicryl suture. The skin was opposed with a running subcuticular 4-0 Vicryl suture. Dermabond and Steri-strips were applied to both incisions. Dressings were applied. The patient tolerated the procedure well without immediate post procedural complication. FINDINGS: After catheter placement, the tip lies within the superior cavoatrial junction. The catheter aspirates and flushes normally and is ready for immediate use. IMPRESSION: Successful placement of a right internal jugular approach power injectable Port-A-Cath. The catheter is ready for immediate use. Electronically Signed   By: Simonne Come M.D.   On: 01/19/2023 15:46      Subjective: Patient seen and examined at bedside.  Feels better and wants to go home today.  Denies worsening abdominal pain, nausea or vomiting.  Discharge Exam: Vitals:   02/15/23 0628 02/15/23 0750  BP: (!) 127/58 (!) 122/56  Pulse:  83  Resp:  18  Temp:  99.5 F (37.5 C)  SpO2:  97%    General: Pt is alert, awake, not in acute distress.  Looks chronically ill and deconditioned.  On room air. Cardiovascular: rate controlled, S1/S2 + Respiratory: bilateral decreased breath sounds at bases Abdominal: Soft, still slightly distended, ND, bowel sounds + Extremities: Trace lower extremity edema; no cyanosis    The results of significant diagnostics from this hospitalization (including imaging, microbiology, ancillary and laboratory) are listed  below for reference.     Microbiology: Recent Results (from the past 240 hours)  Resp panel by RT-PCR (RSV, Flu A&B, Covid) Anterior Nasal Swab     Status: None   Collection Time: 02/12/23 11:03 AM   Specimen: Anterior Nasal Swab  Result Value Ref Range Status   SARS Coronavirus 2 by RT PCR NEGATIVE NEGATIVE Final   Influenza A by PCR NEGATIVE NEGATIVE Final   Influenza B by PCR NEGATIVE NEGATIVE Final    Comment: (NOTE) The Xpert Xpress SARS-CoV-2/FLU/RSV plus assay is intended as an aid in the diagnosis of influenza from Nasopharyngeal swab specimens and should not be used as a sole basis for treatment. Nasal washings and aspirates are unacceptable for Xpert Xpress SARS-CoV-2/FLU/RSV testing.  Fact Sheet for Patients: BloggerCourse.com  Fact Sheet for Healthcare Providers: SeriousBroker.it  This test is not yet approved or cleared by the [place] FDA and has been authorized for detection and/or diagnosis of SARS-CoV-2 by FDA under an Emergency Use Authorization (EUA). This EUA will remain in effect (meaning this test can be used) for the duration of the COVID-19 declaration under Section 564(b)(1) of the Act, 21 U.S.C. section 360bbb-3(b)(1), unless the authorization is terminated or revoked.     Resp Syncytial Virus by PCR NEGATIVE NEGATIVE Final    Comment: (NOTE) Fact Sheet for Patients: BloggerCourse.com  Fact Sheet for Healthcare Providers: SeriousBroker.it  This test is not yet approved or cleared by the [place] FDA and has been authorized for detection  and/or diagnosis of SARS-CoV-2 by FDA under an Emergency Use Authorization (EUA). This EUA will remain in effect (meaning this test can be used) for the duration of the COVID-19 declaration under Section 564(b)(1) of the Act, 21 U.S.C. section 360bbb-3(b)(1), unless the authorization is terminated  or revoked.  Performed at [facility], [address], [place], [place] [address]   Gastrointestinal Panel by PCR , Stool     Status: None   Collection Time: 02/12/23  8:03 PM   Specimen: Stool  Result Value Ref Range Status   Campylobacter species NOT DETECTED NOT DETECTED Final   Plesimonas shigelloides NOT DETECTED NOT DETECTED Final   Salmonella species NOT DETECTED NOT DETECTED Final   Yersinia enterocolitica NOT DETECTED NOT DETECTED Final   Vibrio species NOT DETECTED NOT DETECTED Final   Vibrio cholerae NOT DETECTED NOT DETECTED Final   Enteroaggregative E coli (EAEC) NOT DETECTED NOT DETECTED Final   Enteropathogenic E coli (EPEC) NOT DETECTED NOT DETECTED Final   Enterotoxigenic E coli (ETEC) NOT DETECTED NOT DETECTED Final   Shiga like toxin producing E coli (STEC) NOT DETECTED NOT DETECTED Final   Shigella/Enteroinvasive E coli (EIEC) NOT DETECTED NOT DETECTED Final   Cryptosporidium NOT DETECTED NOT DETECTED Final   Cyclospora cayetanensis NOT DETECTED NOT DETECTED Final   Entamoeba histolytica NOT DETECTED NOT DETECTED Final   Giardia lamblia NOT DETECTED NOT DETECTED Final   Adenovirus F40/41 NOT DETECTED NOT DETECTED Final   Astrovirus NOT DETECTED NOT DETECTED Final   Norovirus GI/GII NOT DETECTED NOT DETECTED Final   Rotavirus A NOT DETECTED NOT DETECTED Final   Sapovirus (I, II, IV, and V) NOT DETECTED NOT DETECTED Final    Comment: Performed at [facility], [address] [place], [place] [address]  Culture, body fluid w Gram Stain-bottle     Status: None (Preliminary result)   Collection Time: 02/13/23 10:50 AM   Specimen: Peritoneal Washings  Result Value Ref Range Status   Specimen Description PERITONEAL  Final   Special Requests NONE  Final   Culture   Final    NO GROWTH 2 DAYS Performed at [facility], [address] [place], [place] [address]    Report Status PENDING  Incomplete  Gram stain     Status: None   Collection  Time: 02/13/23 10:50 AM   Specimen: Peritoneal Washings  Result Value Ref Range Status   Specimen Description PERITONEAL  Final   Special Requests NONE  Final   Gram Stain   Final    WBC SEEN NO ORGANISMS SEEN CYTOSPIN SMEAR Performed at [facility], [address] [place], [place] [address]    Report Status 02/13/2023 FINAL  Final     Labs: BNP (last 3 results) No results for input(s): "BNP" in the last 8760 hours. Basic Metabolic Panel: Recent Labs  Lab 02/12/23 1110 02/13/23 0609 02/13/23 0857 02/14/23 0636 02/15/23 0622  NA 141 141  --  142 141  K 5.0 4.9  --  4.0 4.0  CL 108 109  --  111 112*  CO2 19* 20*  --  20* 19*  GLUCOSE 101* 57* 57* 100* 103*  BUN 31* 32*  --  37* 34*  CREATININE 1.59* 1.67*  --  1.49* 1.43*  CALCIUM 8.5* 8.2*  --  8.0* 7.8*  MG  --   --   --  1.9 1.7   Liver Function Tests: Recent Labs  Lab 02/12/23 1110 02/13/23 2376  02/14/23 0636 02/15/23 0622  AST 76* 83* 87* 71*  ALT 20 19 20 18   ALKPHOS 226* 237* 229* 214*  BILITOT 1.4* 1.0 0.8 0.7  PROT 5.8* 5.4* 5.2* 4.9*  ALBUMIN 2.5* 2.3* 2.1* 2.1*   Recent Labs  Lab 02/12/23 1110  LIPASE 51   Recent Labs  Lab 02/12/23 1420  AMMONIA 28   CBC: Recent Labs  Lab 02/12/23 1110 02/13/23 0609 02/14/23 0636 02/15/23 0622  WBC 20.1* 13.8* 10.4 10.5  NEUTROABS 16.8*  --  8.5* 8.4*  HGB 13.8 12.1 11.7* 11.4*  HCT 44.1 38.4 36.8 35.8*  MCV 86.3 86.5 85.4 86.5  PLT 506* 312 241 195   Cardiac Enzymes: No results for input(s): "CKTOTAL", "CKMB", "CKMBINDEX", "TROPONINI" in the last 168 hours. BNP: Invalid input(s): "POCBNP" CBG: Recent Labs  Lab 02/13/23 0831 02/13/23 0837 02/13/23 0914  GLUCAP 27* 57* 177*   D-Dimer No results for input(s): "DDIMER" in the last 72 hours. Hgb A1c No results for input(s): "HGBA1C" in the last 72 hours. Lipid Profile No results for input(s): "CHOL", "HDL", "LDLCALC", "TRIG", "CHOLHDL", "LDLDIRECT" in the last 72 hours. Thyroid  function studies No results for input(s): "TSH", "T4TOTAL", "T3FREE", "THYROIDAB" in the last 72 hours.  Invalid input(s): "FREET3" Anemia work up No results for input(s): "VITAMINB12", "FOLATE", "FERRITIN", "TIBC", "IRON", "RETICCTPCT" in the last 72 hours. Urinalysis    Component Value Date/Time   COLORURINE YELLOW 07/04/2018 2357   APPEARANCEUR CLOUDY (A) 07/04/2018 2357   LABSPEC 1.018 07/04/2018 2357   PHURINE 6.0 07/04/2018 2357   GLUCOSEU NEGATIVE 07/04/2018 2357   HGBUR NEGATIVE 07/04/2018 2357   BILIRUBINUR NEGATIVE 07/04/2018 2357   KETONESUR NEGATIVE 07/04/2018 2357   PROTEINUR 100 (A) 07/04/2018 2357   NITRITE NEGATIVE 07/04/2018 2357   LEUKOCYTESUR NEGATIVE 07/04/2018 2357   Sepsis Labs Recent Labs  Lab 02/12/23 1110 02/13/23 0609 02/14/23 0636 02/15/23 0622  WBC 20.1* 13.8* 10.4 10.5   Microbiology Recent Results (from the past 240 hours)  Resp panel by RT-PCR (RSV, Flu A&B, Covid) Anterior Nasal Swab     Status: None   Collection Time: 02/12/23 11:03 AM   Specimen: Anterior Nasal Swab  Result Value Ref Range Status   SARS Coronavirus 2 by RT PCR NEGATIVE NEGATIVE Final   Influenza A by PCR NEGATIVE NEGATIVE Final   Influenza B by PCR NEGATIVE NEGATIVE Final    Comment: (NOTE) The Xpert Xpress SARS-CoV-2/FLU/RSV plus assay is intended as an aid in the diagnosis of influenza from Nasopharyngeal swab specimens and should not be used as a sole basis for treatment. Nasal washings and aspirates are unacceptable for Xpert Xpress SARS-CoV-2/FLU/RSV testing.  Fact Sheet for Patients: BloggerCourse.com  Fact Sheet for Healthcare Providers: SeriousBroker.it  This test is not yet approved or cleared by the [place] FDA and has been authorized for detection and/or diagnosis of SARS-CoV-2 by FDA under an Emergency Use Authorization (EUA). This EUA will remain in effect (meaning this test can be used) for the  duration of the COVID-19 declaration under Section 564(b)(1) of the Act, 21 U.S.C. section 360bbb-3(b)(1), unless the authorization is terminated or revoked.     Resp Syncytial Virus by PCR NEGATIVE NEGATIVE Final    Comment: (NOTE) Fact Sheet for Patients: BloggerCourse.com  Fact Sheet for Healthcare Providers: SeriousBroker.it  This test is not yet approved or cleared by the [place] FDA and has been authorized for detection and/or diagnosis of SARS-CoV-2 by FDA under an Emergency Use Authorization (EUA). This EUA will remain  in effect (meaning this test can be used) for the duration of the COVID-19 declaration under Section 564(b)(1) of the Act, 21 U.S.C. section 360bbb-3(b)(1), unless the authorization is terminated or revoked.  Performed at [facility], [address], [place], [place] [address]   Gastrointestinal Panel by PCR , Stool     Status: None   Collection Time: 02/12/23  8:03 PM   Specimen: Stool  Result Value Ref Range Status   Campylobacter species NOT DETECTED NOT DETECTED Final   Plesimonas shigelloides NOT DETECTED NOT DETECTED Final   Salmonella species NOT DETECTED NOT DETECTED Final   Yersinia enterocolitica NOT DETECTED NOT DETECTED Final   Vibrio species NOT DETECTED NOT DETECTED Final   Vibrio cholerae NOT DETECTED NOT DETECTED Final   Enteroaggregative E coli (EAEC) NOT DETECTED NOT DETECTED Final   Enteropathogenic E coli (EPEC) NOT DETECTED NOT DETECTED Final   Enterotoxigenic E coli (ETEC) NOT DETECTED NOT DETECTED Final   Shiga like toxin producing E coli (STEC) NOT DETECTED NOT DETECTED Final   Shigella/Enteroinvasive E coli (EIEC) NOT DETECTED NOT DETECTED Final   Cryptosporidium NOT DETECTED NOT DETECTED Final   Cyclospora cayetanensis NOT DETECTED NOT DETECTED Final   Entamoeba histolytica NOT DETECTED NOT DETECTED Final   Giardia lamblia NOT DETECTED NOT DETECTED Final    Adenovirus F40/41 NOT DETECTED NOT DETECTED Final   Astrovirus NOT DETECTED NOT DETECTED Final   Norovirus GI/GII NOT DETECTED NOT DETECTED Final   Rotavirus A NOT DETECTED NOT DETECTED Final   Sapovirus (I, II, IV, and V) NOT DETECTED NOT DETECTED Final    Comment: Performed at [facility], [address] [place], [place] [address]  Culture, body fluid w Gram Stain-bottle     Status: None (Preliminary result)   Collection Time: 02/13/23 10:50 AM   Specimen: Peritoneal Washings  Result Value Ref Range Status   Specimen Description PERITONEAL  Final   Special Requests NONE  Final   Culture   Final    NO GROWTH 2 DAYS Performed at [facility], [address] [place], [place] [address]    Report Status PENDING  Incomplete  Gram stain     Status: None   Collection Time: 02/13/23 10:50 AM   Specimen: Peritoneal Washings  Result Value Ref Range Status   Specimen Description PERITONEAL  Final   Special Requests NONE  Final   Gram Stain   Final    WBC SEEN NO ORGANISMS SEEN CYTOSPIN SMEAR Performed at [facility], [address] [place], [place] [address]    Report Status 02/13/2023 FINAL  Final     Time coordinating discharge: 35 minutes  SIGNED:   Glade Lloyd, MD  [facility] 02/15/2023, 9:43 AM

## 2023-02-16 ENCOUNTER — Encounter: Payer: Self-pay | Admitting: *Deleted

## 2023-02-16 ENCOUNTER — Telehealth: Payer: Self-pay

## 2023-02-16 ENCOUNTER — Telehealth: Payer: Self-pay | Admitting: *Deleted

## 2023-02-16 NOTE — Transitions of Care (Post Inpatient/ED Visit) (Signed)
02/16/2023  Name: Heather Oconnor MRN: [id number] DOB: 24-Mar-1940  Today's TOC FU Call Status: Today's TOC FU Call Status:: Successful TOC FU Call Completed TOC FU Call Complete Date: 02/16/23 Patient's Name and Date of Birth confirmed.  Transition Care Management Follow-up Telephone Call Date of Discharge: 02/15/23 Discharge Facility: [facility]) Type of Discharge: Inpatient Admission Primary Inpatient Discharge Diagnosis:: Nausea vomiting and diarrhea How have you been since you were released from the hospital?: Better (denies N & V, diarrhea, appetite "fair", ate breakfast, dtr provides oversight) Any questions or concerns?: No  Items Reviewed: Did you receive and understand the discharge instructions provided?: Yes Medications obtained,verified, and reconciled?: Yes (Medications Reviewed) Any new allergies since your discharge?: No Dietary orders reviewed?: Yes Type of Diet Ordered:: heart healthy, carb modified,  low sodium   1500 cc daily fluid restriction Do you have support at home?: Yes People in Home: alone, child(ren), adult Name of Support/Comfort Primary Source: aide in the home 7-11 am,   daughter Anthoney Harada checks in on patient daily and provides oversight for all care, appts  Wyatt Mage is working today and not available to talk w/ RN CM) Patient agreeable to enrollment in TOC 30 day program   Goals Addressed             This Visit's Progress    Transition of Care/ pt will have no readmissions within 30 days       Current Barriers:  Home Health services follow up and make sure [facility] made home visit, pt refused for RN CM to check while she was on the phone, states " they already come out to my house and I'll get my daughter to call"  RN CM called after the telephone visit and spoke with Marylene Land at [facility] who reports PT will be seeing pt in the home on 02/18/23. Knowledge Deficits related to plan of care for management of hepatocellular cancer (admitted for  nausea, vomiting, diarrhea)  Patient denies any nausea, vomiting, diarrhea, states "appetite is fair"   has aide come into her home 7-11 am to assist with ADL's,  daughter Wyatt Mage sees pt daily and provides oversight (daughter works so not always able to speak with her) Patient to follow up with oncology 02/18/23 for treatment (immunotherapy) Patient reports she is walker at all times, states she has all medications and taking as prescribed, pt states she will coordinate with daughter and get post hospital follow up appointment made for primary care provider  RNCM Clinical Goal(s):  Patient will work with the Care Management team over the next 30 days to address Transition of Care Barriers: Home Health services verbalize understanding of plan for management of hepatocellular cancer as evidenced by pt/ cg report, review of EMR and   through collaboration with RN Care manager, provider, and care team.   Interventions: Evaluation of current treatment plan related to  self management and patient's adherence to plan as established by provider   Hepatocellular cancer  (Status:  New goal. and Goal on track:  Yes.)  Short Term Goal Evaluation of current treatment plan related to  hepatocellular cancer , ADL IADL limitations self-management and patient's adherence to plan as established by provider. Discussed plans with patient for ongoing care management follow up and provided patient with direct contact information for care management team Evaluation of current treatment plan related to hepatocellular cancer and patient's adherence to plan as established by provider Provided education to patient re: management of nausea, vomiting, diarrhea if  recurs, 1500cc daily fluid restriction Reviewed medications with patient and discussed importance of taking as prescribed, report any unwanted side effects Assessed social determinant of health barriers Reviewed safety precautions,importance of using assistive  device at all times Reinforced importance of having daughter to call and schedule primary care provider post hospital appointment/ labs  Patient Goals/Self-Care Activities: Participate in Transition of Care Program/Attend [facility] scheduled calls Notify RN Care Manager of [facility] call rescheduling needs Take all medications as prescribed Attend all scheduled provider appointments Call pharmacy for medication refills 3-7 days in advance of running out of medications You are on 1500cc daily fluid restriction Please use your walker at all times Have your daughter call and scheduled post hospital appointment/ labs with primary care provider [facility] home health PT will see you in the home on 02/18/23  Follow Up Plan:  Telephone follow up appointment with care management team member scheduled for:  02/23/23 @ 1015 am           Medications Reviewed Today: Medications Reviewed Today     Reviewed by Audrie Gallus, RN (Registered Nurse) on 02/16/23 at 1120  Med List Status: <None>   Medication Order Taking? Sig Documenting Provider Last Dose Status Informant  amLODipine (NORVASC) 10 MG tablet [id number] Yes TAKE 1 TABLET EVERY DAY Gabriel Earing, FNP Taking Active Child, Pharmacy Records  apixaban (ELIQUIS) 5 MG TABS tablet [id number]  Take 1 tablet (5 mg total) by mouth 2 (two) times daily. [name], MD  Expired 02/12/23 2359 Child, Pharmacy Records  bisoprolol (ZEBETA) 5 MG tablet [id number] Yes TAKE 1 AND 1/2 TABLETS EVERY DAY Gabriel Earing, FNP Taking Active Child, Pharmacy Records  cholestyramine Lanetta Inch) 4 g packet [id number] Yes Take 0.5-1 packets (2-4 g total) by mouth 2 (two) times daily. Gabriel Earing, FNP Taking Active Child, Pharmacy Records  cloNIDine (CATAPRES) 0.2 MG tablet [id number] Yes TAKE 1 TABLET TWICE DAILY Gabriel Earing, FNP Taking Active Child, Pharmacy Records           Med Note [facility] Alinda Dooms A   Sat Feb 12, 2023  9:18 PM)    dorzolamide-timolol  (COSOPT) 22.3-6.8 MG/ML ophthalmic solution [id number] Yes Place 1 drop into both eyes 2 (two) times daily. [provider] Taking Active Child, Pharmacy Records  hydrALAZINE (APRESOLINE) 100 MG tablet [id number] Yes Take 0.5 tablets (50 mg total) by mouth 3 (three) times daily. Glade Lloyd, MD Taking Active   lidocaine-prilocaine (EMLA) cream [id number] Yes Apply to affected area once  Patient taking differently: Apply 1 Application topically once. For port   [name], MD Taking Active Child, Pharmacy Records           Med Note Kandis Cocking Alinda Dooms A   Sat Feb 12, 2023  9:45 PM) Hasn't started  megestrol (MEGACE ES) 625 MG/5ML suspension [id number] Yes Take 5 mLs (625 mg total) by mouth daily. [name], MD Taking Active Child, Pharmacy Records           Med Note Kandis Cocking Alinda Dooms A   Sat Feb 12, 2023  9:44 PM) Has not started this medication. Start date was prescribed for 02/13/23  mirtazapine (REMERON) 15 MG tablet [id number] Yes TAKE 1 TABLET AT BEDTIME Gabriel Earing, FNP Taking Active Child, Pharmacy Records  ondansetron [facility]) 8 MG tablet [id number] Yes Take 1 tablet (8 mg total) by mouth every 8 (eight) hours as needed for nausea or vomiting. [name], MD Taking Active Child, Pharmacy Records  pantoprazole (PROTONIX) 40  MG tablet [id number] Yes TAKE 1 TABLET EVERY DAY Gabriel Earing, FNP Taking Active Child, Pharmacy Records  prochlorperazine (COMPAZINE) 10 MG tablet [id number] Yes Take 1 tablet (10 mg total) by mouth every 6 (six) hours as needed for nausea or vomiting. [name], MD Taking Active Child, Pharmacy Records           Med Note Kandis Cocking Alinda Dooms A   Sat Feb 12, 2023  9:18 PM)    traMADol (ULTRAM) 50 MG tablet [id number] Yes Take 1 tablet (50 mg total) by mouth every 6 (six) hours as needed. [name], MD Taking Active Child, Pharmacy Records  TRUE METRIX BLOOD GLUCOSE TEST test strip [id number] Yes 1 each by Other route 4  (four) times daily as needed. [provider] Taking Active Child, Pharmacy Records  TRUEplus Lancets 33G MISC [id number] Yes Test BS up to four times daily as needed Dx E11.9 Gwenlyn Fudge, FNP Taking Active Child, Pharmacy Records            Home Care and Equipment/Supplies: Were Home Health Services Ordered?: Yes Name of Home Health Agency:: [facility] Has Agency set up a time to come to your home?: No (pt states Frances Furbish is ongoing working w/ her, states she is leaving this up to her dtr to coordinate and refuses for RN CM to call and ask when they will see pt in the home) EMR reviewed for Home Health Orders:  (will follow up w/ pt next week to verify [facility] seen her) Any new equipment or medical supplies ordered?: No  Functional Questionnaire: Do you need assistance with bathing/showering or dressing?: Yes (aide comes in from 7-11 am and assists with bath) Do you need assistance with meal preparation?: Yes (aide , dtr assists,  pt can fix light meals) Do you need assistance with eating?: No Do you have difficulty maintaining continence: No Do you need assistance with getting out of bed/getting out of a chair/moving?: Yes (uses walker) Do you have difficulty managing or taking your medications?: Yes (dtr provides oversight)  Follow up appointments reviewed: PCP Follow-up appointment confirmed?: No (pt does not want RN CM to schedule appt, states she has to coordinate w/ her dtr and dtr will scheduled the appt.) MD Provider Line [phone] Given: No Specialist Hospital Follow-up appointment confirmed?: Yes Date of Specialist follow-up appointment?: 02/18/23 Follow-Up Specialty Provider:: pt has scheduled treatment w/ oncology Do you need transportation to your follow-up appointment?: No Do you understand care options if your condition(s) worsen?: Yes-patient verbalized understanding  SDOH Interventions Today    Flowsheet Row Most Recent Value  SDOH  Interventions   Food Insecurity Interventions Intervention Not Indicated  Housing Interventions Intervention Not Indicated  Transportation Interventions Intervention Not Indicated  Utilities Interventions Intervention Not Indicated       Irving Shows [facility], BSN RN Care Manager/ Transition of Care [place] [phone]

## 2023-02-16 NOTE — Telephone Encounter (Signed)
Patient daughter Heather Oconnor called and requested information regarding palliative and hospice care, wanting to know how they differ. RN explained the differences in care and services, all questions answered. No further needs at this time.

## 2023-02-18 ENCOUNTER — Inpatient Hospital Stay (HOSPITAL_BASED_OUTPATIENT_CLINIC_OR_DEPARTMENT_OTHER): Payer: Medicare HMO | Admitting: Nurse Practitioner

## 2023-02-18 ENCOUNTER — Encounter: Payer: Self-pay | Admitting: Nurse Practitioner

## 2023-02-18 ENCOUNTER — Other Ambulatory Visit: Payer: Self-pay

## 2023-02-18 ENCOUNTER — Inpatient Hospital Stay: Payer: Medicare HMO

## 2023-02-18 ENCOUNTER — Inpatient Hospital Stay: Payer: Medicare HMO | Admitting: Oncology

## 2023-02-18 ENCOUNTER — Encounter: Payer: Self-pay | Admitting: Oncology

## 2023-02-18 VITALS — BP 108/54 | HR 64

## 2023-02-18 VITALS — BP 96/65 | HR 73 | Temp 96.7°F | Resp 16

## 2023-02-18 DIAGNOSIS — Z5112 Encounter for antineoplastic immunotherapy: Secondary | ICD-10-CM | POA: Diagnosis not present

## 2023-02-18 DIAGNOSIS — C220 Liver cell carcinoma: Secondary | ICD-10-CM | POA: Diagnosis not present

## 2023-02-18 DIAGNOSIS — R630 Anorexia: Secondary | ICD-10-CM | POA: Diagnosis not present

## 2023-02-18 DIAGNOSIS — G893 Neoplasm related pain (acute) (chronic): Secondary | ICD-10-CM

## 2023-02-18 DIAGNOSIS — Z95828 Presence of other vascular implants and grafts: Secondary | ICD-10-CM

## 2023-02-18 DIAGNOSIS — R634 Abnormal weight loss: Secondary | ICD-10-CM | POA: Diagnosis not present

## 2023-02-18 DIAGNOSIS — R188 Other ascites: Secondary | ICD-10-CM | POA: Diagnosis not present

## 2023-02-18 DIAGNOSIS — Z7189 Other specified counseling: Secondary | ICD-10-CM

## 2023-02-18 DIAGNOSIS — I959 Hypotension, unspecified: Secondary | ICD-10-CM | POA: Diagnosis not present

## 2023-02-18 DIAGNOSIS — Z79899 Other long term (current) drug therapy: Secondary | ICD-10-CM | POA: Diagnosis not present

## 2023-02-18 DIAGNOSIS — R530 Neoplastic (malignant) related fatigue: Secondary | ICD-10-CM

## 2023-02-18 DIAGNOSIS — Z515 Encounter for palliative care: Secondary | ICD-10-CM | POA: Diagnosis not present

## 2023-02-18 DIAGNOSIS — N179 Acute kidney failure, unspecified: Secondary | ICD-10-CM | POA: Insufficient documentation

## 2023-02-18 LAB — CULTURE, BODY FLUID W GRAM STAIN -BOTTLE: Culture: NO GROWTH

## 2023-02-18 LAB — CBC WITH DIFFERENTIAL (CANCER CENTER ONLY)
Abs Immature Granulocytes: 0.1 10*3/uL — ABNORMAL HIGH (ref 0.00–0.07)
Basophils Absolute: 0.1 10*3/uL (ref 0.0–0.1)
Basophils Relative: 0 %
Eosinophils Absolute: 0.1 10*3/uL (ref 0.0–0.5)
Eosinophils Relative: 1 %
HCT: 40.3 % (ref 36.0–46.0)
Hemoglobin: 12.6 g/dL (ref 12.0–15.0)
Immature Granulocytes: 1 %
Lymphocytes Relative: 8 %
Lymphs Abs: 1.4 10*3/uL (ref 0.7–4.0)
MCH: 27.6 pg (ref 26.0–34.0)
MCHC: 31.3 g/dL (ref 30.0–36.0)
MCV: 88.4 fL (ref 80.0–100.0)
Monocytes Absolute: 1.1 10*3/uL — ABNORMAL HIGH (ref 0.1–1.0)
Monocytes Relative: 6 %
Neutro Abs: 14.5 10*3/uL — ABNORMAL HIGH (ref 1.7–7.7)
Neutrophils Relative %: 84 %
Platelet Count: 439 10*3/uL — ABNORMAL HIGH (ref 150–400)
RBC: 4.56 MIL/uL (ref 3.87–5.11)
RDW: 22.5 % — ABNORMAL HIGH (ref 11.5–15.5)
WBC Count: 17.2 10*3/uL — ABNORMAL HIGH (ref 4.0–10.5)
nRBC: 0 % (ref 0.0–0.2)

## 2023-02-18 LAB — CMP (CANCER CENTER ONLY)
ALT: 14 U/L (ref 0–44)
AST: 52 U/L — ABNORMAL HIGH (ref 15–41)
Albumin: 2.7 g/dL — ABNORMAL LOW (ref 3.5–5.0)
Alkaline Phosphatase: 239 U/L — ABNORMAL HIGH (ref 38–126)
Anion gap: 8 (ref 5–15)
BUN: 40 mg/dL — ABNORMAL HIGH (ref 8–23)
CO2: 21 mmol/L — ABNORMAL LOW (ref 22–32)
Calcium: 8.3 mg/dL — ABNORMAL LOW (ref 8.9–10.3)
Chloride: 110 mmol/L (ref 98–111)
Creatinine: 1.67 mg/dL — ABNORMAL HIGH (ref 0.44–1.00)
GFR, Estimated: 30 mL/min — ABNORMAL LOW (ref >60)
Glucose, Bld: 138 mg/dL — ABNORMAL HIGH (ref 70–99)
Potassium: 4.7 mmol/L (ref 3.5–5.1)
Sodium: 139 mmol/L (ref 135–145)
Total Bilirubin: 0.7 mg/dL (ref 0.0–1.2)
Total Protein: 5.5 g/dL — ABNORMAL LOW (ref 6.5–8.1)

## 2023-02-18 MED ORDER — SODIUM CHLORIDE 0.9% FLUSH
10.0000 mL | Freq: Once | INTRAVENOUS | Status: AC
Start: 2023-02-18 — End: 2023-02-18
  Administered 2023-02-18: 10 mL

## 2023-02-18 MED ORDER — TRAMADOL HCL 50 MG PO TABS
50.0000 mg | ORAL_TABLET | Freq: Once | ORAL | Status: AC
  Administered 2023-02-18: 50 mg via ORAL
  Filled 2023-02-18: qty 1

## 2023-02-18 MED ORDER — SODIUM CHLORIDE 0.9% FLUSH
10.0000 mL | INTRAVENOUS | Status: DC | PRN
  Administered 2023-02-18: 10 mL

## 2023-02-18 MED ORDER — SODIUM CHLORIDE 0.9 % IV SOLN
1500.0000 mg | Freq: Once | INTRAVENOUS | Status: AC
  Administered 2023-02-18: 1500 mg via INTRAVENOUS
  Filled 2023-02-18: qty 30

## 2023-02-18 MED ORDER — HEPARIN SOD (PORK) LOCK FLUSH 100 UNIT/ML IV SOLN
500.0000 [IU] | Freq: Once | INTRAVENOUS | Status: AC | PRN
  Administered 2023-02-18: 500 [IU]

## 2023-02-18 MED ORDER — SODIUM CHLORIDE 0.9 % IV SOLN
INTRAVENOUS | Status: DC
Start: 2023-02-18 — End: 2023-02-18

## 2023-02-18 NOTE — Assessment & Plan Note (Addendum)
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  It is contributing to her decreased appetite.  Will arrange for another paracentesis next week.  If she ends up needing paracentesis weekly, discussed option of Pleurx catheter placement.  We are hoping that ascites will improve with continued immunotherapy.

## 2023-02-18 NOTE — Assessment & Plan Note (Signed)
Please review oncology history for additional details and timeline of events.  Confirmed diagnosis based on MRI findings and elevated ASP marker (107). 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   Discussed her case in GI conference on 01/12/2023.  Depending on performance status, she will be considered for liver directed therapy versus systemic treatments.  -Previously I discussed diagnosis, prognosis, staging, treatment options.  Reviewed NCCN guidelines.    -Her performance status was fairly good until recent hospitalization.  Even now she is trying to be independent as much as she can.   Plan made to proceed with single agent durvalumab treatments.  We will avoid tremelimumab because of her visual disturbances.  Also avoiding Avastin since we cannot get EGD to rule out varices.  Also referral sent to IR for TACE of most dominant lesions, in an attempt to decrease tumor burden.  If scheduled, she will need bridging therapy with Lovenox while holding Eliquis periprocedurally.  Explained immunotherapy mechanism, potential for survival extension, and quality of life improvement. Risks include auto-immune side effects. Patient and family opted for immunotherapy alone due to eye issues and potential side effects of combination therapy.  -Labs reviewed.  No dose-limiting toxicities.  Will proceed with cycle 2 of durvalumab at 1500 mg IV dose today.  Plan to continue this every 4 weeks.  She has had decreased appetite, decreased energy.  Our short-term goal is to prevent further hospitalizations and to hopefully improve her appetite and strength.  We anticipate improvement in the next few weeks once her malignancy starts to respond to immunotherapy.  RTC in 4 weeks for labs, follow-up and continuation of durvalumab treatment.

## 2023-02-18 NOTE — Assessment & Plan Note (Signed)
Currently experiencing hypotension, likely due to poor nutrition and fluid shifts. Blood pressure medications include amlodipine, clonidine, and hydralazine. Discussed holding these medications and monitoring blood pressure at home. - Hold amlodipine, clonidine, and hydralazine - Monitor blood pressure at home - Restart blood pressure medications if systolic BP > 140 mmHg

## 2023-02-18 NOTE — Progress Notes (Signed)
Per Dr. Arlana Pouch, OK to treat with Cr 1.67.

## 2023-02-18 NOTE — Patient Instructions (Signed)
[facility] - A DEPT OF [facility]  Discharge Instructions: Thank you for choosing [facility] to provide your oncology and hematology care.   If you have a lab appointment with the Cancer Center, please go directly to the Cancer Center and check in at the registration area.   Wear comfortable clothing and clothing appropriate for easy access to any Portacath or PICC line.   We strive to give you quality time with your provider. You may need to reschedule your appointment if you arrive late (15 or more minutes).  Arriving late affects you and other patients whose appointments are after yours.  Also, if you miss three or more appointments without notifying the office, you may be dismissed from the clinic at the provider's discretion.      For prescription refill requests, have your pharmacy contact our office and allow 72 hours for refills to be completed.    Today you received the following chemotherapy and/or immunotherapy agents: Durvalumab       To help prevent nausea and vomiting after your treatment, we encourage you to take your nausea medication as directed.  BELOW ARE SYMPTOMS THAT SHOULD BE REPORTED IMMEDIATELY: *FEVER GREATER THAN 100.4 F (38 C) OR HIGHER *CHILLS OR SWEATING *NAUSEA AND VOMITING THAT IS NOT CONTROLLED WITH YOUR NAUSEA MEDICATION *UNUSUAL SHORTNESS OF BREATH *UNUSUAL BRUISING OR BLEEDING *URINARY PROBLEMS (pain or burning when urinating, or frequent urination) *BOWEL PROBLEMS (unusual diarrhea, constipation, pain near the anus) TENDERNESS IN MOUTH AND THROAT WITH OR WITHOUT PRESENCE OF ULCERS (sore throat, sores in mouth, or a toothache) UNUSUAL RASH, SWELLING OR PAIN  UNUSUAL VAGINAL DISCHARGE OR ITCHING   Items with * indicate a potential emergency and should be followed up as soon as possible or go to the Emergency Department if any problems should occur.  Please show the CHEMOTHERAPY ALERT CARD or  IMMUNOTHERAPY ALERT CARD at check-in to the Emergency Department and triage nurse.  Should you have questions after your visit or need to cancel or reschedule your appointment, please contact [facility] - A DEPT OF [facility]  Dept: [phone]  and follow the prompts.  Office hours are 8:00 a.m. to 4:30 p.m. Monday - Friday. Please note that voicemails left after 4:00 p.m. may not be returned until the following business day.  We are closed weekends and major holidays. You have access to a nurse at all times for urgent questions. Please call the main number to the clinic Dept: [phone] and follow the prompts.   For any non-urgent questions, you may also contact your provider using MyChart. We now offer e-Visits for anyone 18 and older to request care online for non-urgent symptoms. For details visit [email or web address].   Also download the MyChart app! Go to the app store, search "MyChart", open the app, select [facility], and log in with your MyChart username and password.

## 2023-02-18 NOTE — Assessment & Plan Note (Signed)
Creatinine levels increasing, currently at 1.67 from 1.4, possibly due to fluid shifts and poor oral intake. - Monitor renal function closely - Encourage adequate fluid intake

## 2023-02-18 NOTE — Assessment & Plan Note (Signed)
Decreased appetite and difficulty eating dry foods. Discussed oral Megace to improve appetite and patient was agreeable.  Prescription sent.

## 2023-02-18 NOTE — Progress Notes (Signed)
[facility]  ONCOLOGY CLINIC PROGRESS NOTE   Patient Care Team: Heather Earing, FNP as PCP - General (Family Medicine) Heather Rotunda, MD as PCP - Cardiology (Cardiology) Heather Gauss Gerrit Friends, MD as Consulting Physician (Gastroenterology) Bond, Doran Stabler, MD as Consulting Physician (Ophthalmology) Heather Oconnor, OD (Optometry) Pickenpack-Cousar, Arty Baumgartner, NP as Nurse Practitioner [facility] and Palliative Medicine)  PATIENT NAME: Heather Oconnor   MR#: [id number] DOB: May 31, 1940  Date of visit: 02/18/2023   ASSESSMENT & PLAN:   Heather Oconnor is a 83 y.o. lady with a past medical history of CVA on Plavix, IBS, history of pancreatic pseudocysts, HLD, HTN, HFpEF, COPD, CKD stage 3b, ? H/o cirrhosis with normal follow up imaging per GI note in 09/2022, depression, was referred to our clinic in January 2025 for newly diagnosed hepatocellular carcinoma. 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  Hepatocellular carcinoma (HCC) Please review oncology history for additional details and timeline of events.  Confirmed diagnosis based on MRI findings and elevated ASP marker (107). 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   Discussed her case in GI conference on 01/12/2023.  Depending on performance status, she will be considered for liver directed therapy versus systemic treatments.  -Previously I discussed diagnosis, prognosis, staging, treatment options.  Reviewed NCCN guidelines.    -Her performance status was fairly good until recent hospitalization.  Even now she is trying to be independent as much as she can.   Plan made to proceed with single agent durvalumab treatments.  We will avoid tremelimumab because of her visual disturbances.  Also avoiding Avastin since we cannot get EGD to rule out varices.  Also referral sent to IR for TACE of most dominant lesions, in an attempt to decrease tumor burden.  If scheduled, she will need  bridging therapy with Lovenox while holding Eliquis periprocedurally.  Explained immunotherapy mechanism, potential for survival extension, and quality of life improvement. Risks include auto-immune side effects. Patient and family opted for immunotherapy alone due to eye issues and potential side effects of combination therapy.  -Labs reviewed.  No dose-limiting toxicities.  Will proceed with cycle 2 of durvalumab at 1500 mg IV dose today.  Plan to continue this every 4 weeks.  She has had decreased appetite, decreased energy.  Our short-term goal is to prevent further hospitalizations and to hopefully improve her appetite and strength.  We anticipate improvement in the next few weeks once her malignancy starts to respond to immunotherapy.  RTC in 4 weeks for labs, follow-up and continuation of durvalumab treatment.  Other ascites Recurrent ascites, likely because of underlying malignancy.  It is contributing to her decreased appetite.  Will arrange for another paracentesis next week.  If she ends up needing paracentesis weekly, discussed option of Pleurx catheter placement.  We are hoping that ascites will improve with continued immunotherapy.  Decreased appetite Decreased appetite and difficulty eating dry foods. Discussed oral Megace to improve appetite and patient was agreeable.  Prescription sent.  AKI (acute kidney injury) (HCC) Creatinine levels increasing, currently at 1.67 from 1.4, possibly due to fluid shifts and poor oral intake. - Monitor renal function closely - Encourage adequate fluid intake  Hypotension Currently experiencing hypotension, likely due to poor nutrition and fluid shifts. Blood pressure medications include amlodipine, clonidine, and hydralazine. Discussed holding these medications and monitoring blood pressure at home. - Hold amlodipine, clonidine, and hydralazine - Monitor blood pressure at home - Restart blood pressure medications if systolic BP > 140  mmHg  I reviewed lab results and outside records for this visit and discussed relevant results with the patient. Diagnosis, plan of care and treatment options were also discussed in detail with the patient. Opportunity provided to ask questions and answers provided to her apparent satisfaction. Provided instructions to call our clinic with any problems, questions or concerns prior to return visit. I recommended to continue follow-up with PCP and sub-specialists. She verbalized understanding and agreed with the plan.   NCCN guidelines have been consulted in the planning of this patient's care.  I spent a total of 45 minutes during this encounter with the patient including review of chart and various tests results, discussions about plan of care and coordination of care plan.   Heather Crutch, MD  02/18/2023 2:31 PM  [facility] - A DEPT OF [facility] [address] [place] [address] Dept: [phone] Dept Fax: [phone]    CHIEF COMPLAINT/ REASON FOR VISIT:   Multifocal hepatocellular carcinoma.  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  Current Treatment:  Started durvalumab from 01/21/2023.  Plan to continue every 4 weeks.  Also referral sent to IR for TACE of most dominant lesions, in an attempt to decrease tumor burden.  INTERVAL HISTORY:    Discussed the use of AI scribe software for clinical note transcription with the patient, who gave verbal consent to proceed.   Heather Oconnor is here today for repeat clinical assessment.   On 02/14/2023, she had a paracentesis performed during a hospital admission, but the ascites has quickly recurred. The patient reports that lying down and stretching out alleviates the discomfort. The patient's appetite has been poor, with the main intake being [place] ice and vitamin water. The patient is on immunotherapy for cancer treatment, and the  hope is that this will slow down the ascites formation. The patient also has a history of blood pressure issues, but the current blood pressure is low, possibly due to poor nutrition.  I have reviewed the past medical history, past surgical history, social history and family history with the patient and they are unchanged from previous note.  HISTORY OF PRESENT ILLNESS:   ONCOLOGY HISTORY:   She presented to the hospital on 01/03/2023 with 3-week history of diffuse abdominal pain, poor oral intake, nausea.  On exam she had diffuse tenderness to palpation without guarding or rebound.  Initial ultrasound in the ED showed cholelithiasis with possible cholecystitis.  She was also noted to have hyperbilirubinemia and transaminitis with dilated CBD on ultrasound.  Liver lesions, left portal vein thrombosis were also noted on ultrasound.  She was hospitalized for further evaluation and management.   General surgery, GI were consulted.  There was no role for surgery.  MRCP on 01/04/2023 confirmed portal vein thrombosis as well as multifocal hepatic masses, bilobar.  Tumor invaded the portal vein with large tumor thrombus within the left portal vein and early tumor thrombus involvement of the right portal vein.  Enhancement pattern and portal vein tumor thrombosis are typical of hepatocellular carcinoma.  There was also porta hepatis lymphadenopathy.   On 01/05/2023, CT chest showed solid, slightly spiculated 1.1 cm posteromedial right lower lobe pulmonary nodule, suspicious for pulmonary metastatic disease.  No thoracic adenopathy.   AFP was elevated at 107 ng/mL on 01/05/2023.   Oncology service was consulted when patient was in the hospital.  Dr. Al Pimple evaluated the patient on 01/05/2023.  Since patient was not a candidate  for surgery or transplant, liver directed therapies or systemic treatments were discussed.  Plan made to discuss her case in GI multidisciplinary conference.   Discussed her case in GI  conference on 01/12/2023.  Depending on performance status, she will be considered for liver directed therapy versus systemic treatments.   Plan to proceed with single agent durvalumab treatments.  We will avoid tremelimumab because of her visual disturbances.  Also avoiding Avastin since we cannot get EGD to rule out varices.  Also referral sent to IR for TACE of most dominant lesions, in an attempt to decrease tumor burden.   Started durvalumab from 01/21/2023.  Plan to continue every 4 weeks.  Oncology History  Hepatocellular carcinoma (HCC)  01/06/2023 Initial Diagnosis   Hepatocellular carcinoma (HCC)   01/13/2023 Cancer Staging   Staging form: Liver, AJCC 8th Edition - Clinical: Stage IVB (cT3, cN1, cM1) - Signed by Heather Crutch, MD on 01/13/2023   01/21/2023 -  Chemotherapy   Patient is on Treatment Plan : Durvalumab (1500) q28d         REVIEW OF SYSTEMS:   Review of Systems - Oncology  All other pertinent systems were reviewed with the patient and are negative.  ALLERGIES: She is allergic to hctz [hydrochlorothiazide] and lisinopril.  MEDICATIONS:  Current Outpatient Medications  Medication Sig Dispense Refill   amLODipine (NORVASC) 10 MG tablet TAKE 1 TABLET EVERY DAY 90 tablet 1   bisoprolol (ZEBETA) 5 MG tablet TAKE 1 AND 1/2 TABLETS EVERY DAY 135 tablet 0   cloNIDine (CATAPRES) 0.2 MG tablet TAKE 1 TABLET TWICE DAILY 180 tablet 1   dorzolamide-timolol (COSOPT) 22.3-6.8 MG/ML ophthalmic solution Place 1 drop into both eyes 2 (two) times daily.     hydrALAZINE (APRESOLINE) 100 MG tablet Take 0.5 tablets (50 mg total) by mouth 3 (three) times daily.     lidocaine-prilocaine (EMLA) cream Apply to affected area once (Patient taking differently: Apply 1 Application topically once. For port) 30 g 3   megestrol (MEGACE ES) 625 MG/5ML suspension Take 5 mLs (625 mg total) by mouth daily. 150 mL 2   mirtazapine (REMERON) 15 MG tablet TAKE 1 TABLET AT BEDTIME 90 tablet 3    ondansetron (ZOFRAN) 8 MG tablet Take 1 tablet (8 mg total) by mouth every 8 (eight) hours as needed for nausea or vomiting. 30 tablet 1   pantoprazole (PROTONIX) 40 MG tablet TAKE 1 TABLET EVERY DAY 90 tablet 1   prochlorperazine (COMPAZINE) 10 MG tablet Take 1 tablet (10 mg total) by mouth every 6 (six) hours as needed for nausea or vomiting. 30 tablet 1   traMADol (ULTRAM) 50 MG tablet Take 1 tablet (50 mg total) by mouth every 6 (six) hours as needed. 60 tablet 0   TRUE METRIX BLOOD GLUCOSE TEST test strip 1 each by Other route 4 (four) times daily as needed.     TRUEplus Lancets 33G MISC Test BS up to four times daily as needed Dx E11.9 400 each 3   apixaban (ELIQUIS) 5 MG TABS tablet Take 1 tablet (5 mg total) by mouth 2 (two) times daily. 60 tablet 5   cholestyramine (QUESTRAN) 4 g packet Take 0.5-1 packets (2-4 g total) by mouth 2 (two) times daily. 60 each 12   No current facility-administered medications for this visit.   Facility-Administered Medications Ordered in Other Visits  Medication Dose Route Frequency Provider Last Rate Last Admin   0.9 %  sodium chloride infusion   Intravenous Continuous Kedron Uno, MD 10  mL/hr at 02/18/23 1219 New Bag at 02/18/23 1219   durvalumab (IMFINZI) 1,500 mg in sodium chloride 0.9 % 100 mL chemo infusion  1,500 mg Intravenous Once Channin Agustin, MD 130 mL/hr at 02/18/23 1331 1,500 mg at 02/18/23 1331   heparin lock flush 100 unit/mL  500 Units Intracatheter Once PRN Fermina Mishkin, MD       sodium chloride flush (NS) 0.9 % injection 10 mL  10 mL Intracatheter PRN Ahamed Hofland, MD         VITALS:   Blood pressure 96/65, pulse 73, temperature (!) 96.7 F (35.9 C), temperature source Temporal, resp. rate 16, SpO2 98%.  Wt Readings from Last 3 Encounters:  02/15/23 137 lb 12.6 oz (62.5 kg)  01/28/23 135 lb 9.6 oz (61.5 kg)  01/21/23 130 lb 11.2 oz (59.3 kg)    There is no height or weight on file to calculate BMI.   Onc Performance  Status - 02/18/23 1400       ECOG Perf Status   ECOG Perf Status Ambulatory and capable of all selfcare but unable to carry out any work activities.  Up and about more than 50% of waking hours      KPS SCALE   KPS % SCORE Cares for self, unable to carry on normal activity or to do active work              PHYSICAL EXAM:   Physical Exam Constitutional:      General: She is not in acute distress.    Appearance: Normal appearance.  HENT:     Head: Normocephalic and atraumatic.  Eyes:     General: No scleral icterus.    Conjunctiva/sclera: Conjunctivae normal.  Cardiovascular:     Rate and Rhythm: Normal rate and regular rhythm.     Heart sounds: Normal heart sounds.  Pulmonary:     Effort: Pulmonary effort is normal.     Breath sounds: Normal breath sounds.  Abdominal:     General: There is distension.  Musculoskeletal:     Right lower leg: Edema present.     Left lower leg: Edema present.  Neurological:     General: No focal deficit present.     Mental Status: She is alert and oriented to person, place, and time.  Psychiatric:        Mood and Affect: Mood normal.        Behavior: Behavior normal.        Thought Content: Thought content normal.       LABORATORY DATA:   I have reviewed the data as listed.  Results for orders placed or performed in visit on 02/18/23  CMP (Cancer Center only)  Result Value Ref Range   Sodium 139 135 - 145 mmol/L   Potassium 4.7 3.5 - 5.1 mmol/L   Chloride 110 98 - 111 mmol/L   CO2 21 (L) 22 - 32 mmol/L   Glucose, Bld 138 (H) 70 - 99 mg/dL   BUN 40 (H) 8 - 23 mg/dL   Creatinine 1.61 (H) 0.96 - 1.00 mg/dL   Calcium 8.3 (L) 8.9 - 10.3 mg/dL   Total Protein 5.5 (L) 6.5 - 8.1 g/dL   Albumin 2.7 (L) 3.5 - 5.0 g/dL   AST 52 (H) 15 - 41 U/L   ALT 14 0 - 44 U/L   Alkaline Phosphatase 239 (H) 38 - 126 U/L   Total Bilirubin 0.7 0.0 - 1.2 mg/dL   GFR, Estimated 30 (L) >60 mL/min  Anion gap 8 5 - 15  CBC with Differential (Cancer  Center Only)  Result Value Ref Range   WBC Count 17.2 (H) 4.0 - 10.5 K/uL   RBC 4.56 3.87 - 5.11 MIL/uL   Hemoglobin 12.6 12.0 - 15.0 g/dL   HCT 08.6 57.8 - 46.9 %   MCV 88.4 80.0 - 100.0 fL   MCH 27.6 26.0 - 34.0 pg   MCHC 31.3 30.0 - 36.0 g/dL   RDW 62.9 (H) 52.8 - 41.3 %   Platelet Count 439 (H) 150 - 400 K/uL   nRBC 0.0 0.0 - 0.2 %   Neutrophils Relative % 84 %   Neutro Abs 14.5 (H) 1.7 - 7.7 K/uL   Lymphocytes Relative 8 %   Lymphs Abs 1.4 0.7 - 4.0 K/uL   Monocytes Relative 6 %   Monocytes Absolute 1.1 (H) 0.1 - 1.0 K/uL   Eosinophils Relative 1 %   Eosinophils Absolute 0.1 0.0 - 0.5 K/uL   Basophils Relative 0 %   Basophils Absolute 0.1 0.0 - 0.1 K/uL   Immature Granulocytes 1 %   Abs Immature Granulocytes 0.10 (H) 0.00 - 0.07 K/uL    RADIOGRAPHIC STUDIES:  I have personally reviewed the radiological images as listed and agree with the findings in the report.  US Paracentesis Result Date: 02/13/2023 INDICATION: Patient with a history of hepatocellular carcinoma presents with ascites. Interventional radiology asked to perform a diagnostic and therapeutic paracentesis. EXAM: ULTRASOUND GUIDED PARACENTESIS MEDICATIONS: 1% lidocaine 10 mL COMPLICATIONS: None immediate. PROCEDURE: Informed written consent was obtained from the patient after a discussion of the risks, benefits and alternatives to treatment. A timeout was performed prior to the initiation of the procedure. Initial ultrasound scanning demonstrates a large amount of ascites within the right upper abdominal quadrant. The right upper abdomen was prepped and draped in the usual sterile fashion. 1% lidocaine was used for local anesthesia. Following this, a 19 gauge, 7-cm, Yueh catheter was introduced. An ultrasound image was saved for documentation purposes. The paracentesis was performed. The catheter was removed and a dressing was applied. The patient tolerated the procedure well without immediate post procedural  complication. FINDINGS: A total of approximately 1.1 L of clear yellow fluid was removed. Samples were sent to the laboratory as requested by the clinical team. IMPRESSION: Successful ultrasound-guided paracentesis yielding 1.1 liters of peritoneal fluid. Procedure performed by Alwyn Ren NP Electronically Signed   By: Malachy Moan M.D.   On: 02/13/2023 11:49   DG Chest 2 View Result Date: 02/12/2023 CLINICAL DATA:  Cough, stage IV liver cancer EXAM: CHEST - 2 VIEW COMPARISON:  07/08/2018 chest radiograph. FINDINGS: Right Port-A-Cath terminates at the cavoatrial junction. Stable cardiomediastinal silhouette with normal heart size. No pneumothorax. Small bilateral pleural effusions, right greater than left. No overt pulmonary edema. Mild right basilar atelectasis. IMPRESSION: Small bilateral pleural effusions, right greater than left. Mild right basilar atelectasis. Electronically Signed   By: Delbert Phenix M.D.   On: 02/12/2023 12:06   CT ABDOMEN PELVIS W CONTRAST Result Date: 02/12/2023 CLINICAL DATA:  Worsening abdominal pain with nausea and vomiting. History of metastatic HCC. EXAM: CT ABDOMEN AND PELVIS WITH CONTRAST TECHNIQUE: Multidetector CT imaging of the abdomen and pelvis was performed using the standard protocol following bolus administration of intravenous contrast. RADIATION DOSE REDUCTION: This exam was performed according to the departmental dose-optimization program which includes automated exposure control, adjustment of the mA and/or kV according to patient size and/or use of iterative reconstruction technique. CONTRAST:  75mL OMNIPAQUE IOHEXOL 350 MG/ML SOLN COMPARISON:  CT chest dated January 05, 2023. MRI abdomen dated January 03, 2023. CT abdomen pelvis dated December 25, 2018. FINDINGS: Lower chest: Increasing small right pleural effusion. Unchanged 1 cm metastasis in the medial right lower lobe. Resolved trace left pleural effusion. Unchanged small pericardial effusion.  Hepatobiliary: Numerous liver masses overall appear stable or decreased in size when compared to visualization on most recent chest CT from January 1st. For example, a central lesion previously measuring 3.4 cm currently measures 2.3 cm (series 3, image 12). 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 Proximal portal vein, splenic vein and SMV remain patent. Distended, thin walled gallbladder with several tiny gallstones again noted. Unchanged mild central intrahepatic and proximal common bile duct dilatation up to 11 mm. Small filling defect again noted in the distal common bile duct (series 3, image 34). Pancreas: Unremarkable. No pancreatic ductal dilatation or surrounding inflammatory changes. Spleen: Normal in size without focal abnormality. Adrenals/Urinary Tract: Adrenal glands are unremarkable. Kidneys are normal, without renal calculi, focal lesion, or hydronephrosis. Bladder is unremarkable. Stomach/Bowel: Unchanged small hiatal hernia. The stomach is otherwise within normal limits. Mild colonic wall thickening near the hepatic flexure is likely due to portal colopathy. No obstruction. Mild left-sided colonic diverticulosis. Vascular/Lymphatic: Portal thrombosis as described above. Aortoiliac atherosclerotic vascular disease. Unchanged enlarged portacaval lymph nodes measuring up to 1.2 cm. Reproductive: Uterus and bilateral adnexa are unremarkable. Other: New moderate ascites.  No pneumoperitoneum. Musculoskeletal: No acute or significant osseous findings. IMPRESSION: 1. Multifocal hepatocellular carcinoma involving both liver lobes with overall stable or decreased humerus size when compared to most recent chest CT. 2. 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  3. Unchanged mild central intrahepatic and proximal common bile duct dilatation up to 11 mm. Small filling defect again noted in the distal common bile duct, concerning for choledocholithiasis. 4. New moderate ascites. 5. Increasing small right pleural effusion. Resolved trace left pleural effusion. Unchanged small pericardial effusion. 6.  Aortic Atherosclerosis (ICD10-I70.0). Electronically Signed   By: Obie Dredge M.D.   On: 02/12/2023 12:05   [place] ASCITES (ABDOMEN LIMITED) Result Date: 02/11/2023 CLINICAL DATA:  83 year old female with sudden abdominal swelling. Malignant liver tumors. EXAM: LIMITED ABDOMEN ULTRASOUND FOR ASCITES TECHNIQUE: Limited ultrasound survey for ascites was performed in all four abdominal quadrants. COMPARISON:  Abdomen MRI 01/03/2023.  Chest CT 01/05/2023. FINDINGS: Grayscale images of the abdomen in all 4 quadrants now demonstrate a moderate to large volume of ascites in the right abdomen including surrounding the liver (image 4) and bowel loops in the right lower quadrant (image 5). Interestingly, there is little to no ascites fluid visible in the left abdomen. This might be related to patient positioning. Extremely heterogeneous liver echotexture consistent with extensive liver tumors again noted. IMPRESSION: 1. Moderate to large volume of ascites although limited to the right abdomen, increased from 01/03/23 and 01/05/23 imaging. 2. Extensive liver tumor again noted. Electronically Signed   By: Odessa Fleming M.D.   On: 02/11/2023 09:01    CODE STATUS:  Code Status History     Date Active Date Inactive Code Status Order ID Comments User Context   01/19/2023 1012 01/20/2023 0509 Full Code [id number]  Simonne Come, MD HOV   01/05/2023 1031 01/06/2023 2334 Do not attempt resuscitation (DNR) PRE-ARREST  INTERVENTIONS DESIRED [id number]  Cyndia Skeeters, DO Inpatient   01/03/2023 1721 01/05/2023 1031 Full Code [id number]  Cyndia Skeeters, DO ED   07/04/2018 2357 07/14/2018 2145 Full Code [id number]   Briscoe Deutscher, MD Inpatient   06/12/2018 1347 06/18/2018 1635 Full Code [id number]  Erick Blinks, DO ED    Questions for Most Recent Historical Code Status (Order [id number])     Question Answer   By: Consent: discussion documented in EHR            Orders Placed This Encounter  Procedures   CBC with Differential (Cancer Center Only)    Standing Status:   Future    Expected Date:   05/13/2023    Expiration Date:   05/12/2024   CMP (Cancer Center only)    Standing Status:   Future    Expected Date:   05/13/2023    Expiration Date:   05/12/2024   CBC with Differential (Cancer Center Only)    Standing Status:   Future    Expected Date:   06/10/2023    Expiration Date:   06/09/2024   CMP (Cancer Center only)    Standing Status:   Future    Expected Date:   06/10/2023    Expiration Date:   06/09/2024   T4    Standing Status:   Future    Expected Date:   06/10/2023    Expiration Date:   06/09/2024   TSH    Standing Status:   Future    Expected Date:   06/10/2023    Expiration Date:   06/09/2024     Future Appointments  Date Time Provider Department Center  02/23/2023 10:15 AM Audrie Gallus, RN [facility] None  03/17/2023 10:30 AM [facility] FLUSH [facility] None  03/17/2023 11:00 AM Corley Maffeo, MD [facility] None  03/17/2023 12:00 PM [facility] INFUSION [facility] None  04/15/2023 10:30 AM [facility] FLUSH [facility] None  04/15/2023 11:00 AM Leanor Voris, MD [facility] None  04/15/2023 12:00 PM [facility] INFUSION [facility] None  07/28/2023 10:30 AM Heather Earing, FNP [facility] None  12/08/2023  9:20 AM [facility] WELLNESS VISIT [facility] None      This document was completed utilizing speech recognition software. Grammatical errors, random word insertions, pronoun errors, and incomplete sentences are an occasional consequence of this system due to software limitations, ambient noise, and hardware issues. Any formal questions or concerns about the content, text or information  contained within the body of this dictation should be directly addressed to the provider for clarification.

## 2023-02-21 ENCOUNTER — Ambulatory Visit (HOSPITAL_COMMUNITY)
Admission: RE | Admit: 2023-02-21 | Discharge: 2023-02-21 | Disposition: A | Discharge status: Home / Self Care | Payer: Medicare HMO | Source: Ambulatory Visit | Attending: Oncology | Admitting: Oncology

## 2023-02-21 DIAGNOSIS — R188 Other ascites: Secondary | ICD-10-CM | POA: Insufficient documentation

## 2023-02-21 DIAGNOSIS — C220 Liver cell carcinoma: Secondary | ICD-10-CM | POA: Diagnosis not present

## 2023-02-21 HISTORY — PX: IR PARACENTESIS: IMG2679

## 2023-02-21 MED ORDER — LIDOCAINE HCL 1 % IJ SOLN
INTRAMUSCULAR | Status: AC
  Filled 2023-02-21: qty 20

## 2023-02-21 NOTE — Procedures (Signed)
PROCEDURE SUMMARY:  Successful US guided paracentesis from right lateral abdomen.  Yielded 1.8 liters of clear yellow fluid.  No immediate complications.  Patient tolerated well.  EBL = trace  Keisy Strickler S Ashwika Freels PA-C 02/21/2023 1:49 PM

## 2023-02-22 DIAGNOSIS — K8010 Calculus of gallbladder with chronic cholecystitis without obstruction: Secondary | ICD-10-CM | POA: Diagnosis not present

## 2023-02-22 DIAGNOSIS — C220 Liver cell carcinoma: Secondary | ICD-10-CM | POA: Diagnosis not present

## 2023-02-22 DIAGNOSIS — I2510 Atherosclerotic heart disease of native coronary artery without angina pectoris: Secondary | ICD-10-CM | POA: Diagnosis not present

## 2023-02-22 DIAGNOSIS — I5032 Chronic diastolic (congestive) heart failure: Secondary | ICD-10-CM | POA: Diagnosis not present

## 2023-02-22 DIAGNOSIS — I81 Portal vein thrombosis: Secondary | ICD-10-CM | POA: Diagnosis not present

## 2023-02-22 DIAGNOSIS — N1832 Chronic kidney disease, stage 3b: Secondary | ICD-10-CM | POA: Diagnosis not present

## 2023-02-22 DIAGNOSIS — E43 Unspecified severe protein-calorie malnutrition: Secondary | ICD-10-CM | POA: Diagnosis not present

## 2023-02-22 DIAGNOSIS — I130 Hypertensive heart and chronic kidney disease with heart failure and stage 1 through stage 4 chronic kidney disease, or unspecified chronic kidney disease: Secondary | ICD-10-CM | POA: Diagnosis not present

## 2023-02-22 DIAGNOSIS — E1122 Type 2 diabetes mellitus with diabetic chronic kidney disease: Secondary | ICD-10-CM | POA: Diagnosis not present

## 2023-02-23 ENCOUNTER — Encounter: Payer: Self-pay | Admitting: *Deleted

## 2023-02-23 ENCOUNTER — Other Ambulatory Visit: Payer: Self-pay | Admitting: *Deleted

## 2023-02-23 NOTE — Patient Outreach (Signed)
Care Management  Transitions of Care Program Transitions of Care Post-discharge week 2   02/23/2023 Name: Heather Oconnor MRN: [id number] DOB: [birth year]  Subjective: Heather Oconnor is a 83 y.o. year old female who is a primary care patient of Gabriel Earing, FNP. The Care Management team Engaged with patient Engaged with patient by telephone to assess and address transitions of care needs.   Consent to Services:  Patient was given information about care management services, agreed to services, and gave verbal consent to participate.   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        SDOH Interventions    Flowsheet Row Telephone from 02/16/2023 in [place] [facility] Telephone from 01/07/2023 in [facility] Clinical Support from 12/07/2022 in [facility] Office Visit from 01/06/2022 in [facility] Clinical Support from 12/02/2021 in [facility] Office Visit from 09/01/2021 in [facility]  SDOH Interventions        Food Insecurity Interventions Intervention Not Indicated Intervention Not Indicated Intervention Not Indicated -- Intervention Not Indicated --  Housing Interventions Intervention Not Indicated Intervention Not Indicated Intervention Not Indicated -- Intervention Not Indicated --  Transportation Interventions Intervention Not Indicated Intervention Not Indicated Intervention Not Indicated -- -- --  Utilities Interventions Intervention Not Indicated Intervention Not Indicated Intervention Not Indicated -- -- --  Alcohol Usage Interventions -- --  Intervention Not Indicated (Score <7) -- -- --  Depression Interventions/Treatment  -- -- -- PHQ2-9 Score <4 Follow-up Not Indicated -- PHQ2-9 Score <4 Follow-up Not Indicated  Financial Strain Interventions -- -- Intervention Not Indicated -- Intervention Not Indicated --  Physical Activity Interventions -- -- Intervention Not Indicated -- Intervention Not Indicated --  Stress Interventions -- -- Intervention Not Indicated -- Intervention Not Indicated --  Social Connections Interventions -- -- Intervention Not Indicated -- Intervention Not Indicated --  Health Literacy Interventions -- -- Intervention Not Indicated -- -- --        Goals Addressed             This Visit's Progress    Transition of Care/ pt will have no readmissions within 30 days       Current Barriers:  Home Health services follow up and make sure [facility] made home visit, pt refused for RN CM to check while she was on the phone, states " they already come out to my house and I'll get my daughter to call"  RN CM called after the telephone visit and spoke with Marylene Land at [facility] who reports PT will be seeing pt in the home on 02/18/23. Knowledge Deficits related to plan of care for management of hepatocellular cancer (admitted for nausea, vomiting, diarrhea)  Patient denies any nausea, vomiting, diarrhea, states "appetite is fair"   has aide come into her home 7-11 am to assist with ADL's,  daughter Wyatt Mage sees pt daily and provides oversight (daughter works so not always able to speak with her) Patient to follow up with oncology 02/18/23 for treatment (immunotherapy) Patient reports she is walker at all times, states she has all medications and taking as prescribed, pt states she will coordinate with daughter and get post hospital  follow up appointment made for primary care provider 02/23/23- pt is asleep and unable to talk, spoke with daughter Wyatt Mage who reports pt had Immunotherapy on 2/14, paracentesis on 2/9 and 2/17 and  will continue as needed,  appetite slightly improved, pt is drinking whole fat chocolate milk to incorporate more protein and is able to tolerate this better than Ensure,  [facility] is working with pt.  RNCM Clinical Goal(s):  Patient will work with the Care Management team over the next 30 days to address Transition of Care Barriers: Home Health services verbalize understanding of plan for management of hepatocellular cancer as evidenced by pt/ cg report, review of EMR and   through collaboration with RN Care manager, provider, and care team.   Interventions: Evaluation of current treatment plan related to  self management and patient's adherence to plan as established by provider Hepatocellular cancer  (Status:  New goal. and Goal on track:  Yes.)  Short Term Goal Evaluation of current treatment plan related to  hepatocellular cancer , ADL IADL limitations self-management and patient's adherence to plan as established by provider. Discussed plans with patient for ongoing care management follow up and provided patient with direct contact information for care management  Reinforced safety precautions,importance of using assistive device at all times Reviewed importance of small, frequent meals Reinforced safety precautions  Patient Goals/Self-Care Activities: Participate in Transition of Care Program/Attend TOC scheduled calls Notify RN Care Manager of TOC call rescheduling needs Take all medications as prescribed Attend all scheduled provider appointments Call pharmacy for medication refills 3-7 days in advance of running out of medications You are on 1500cc daily fluid restriction Please use your walker at all times Continue working with [facility] home health  Use nutritional supplements/ chocolate milk and what you can tolerate  Follow Up Plan:  Telephone follow up appointment with care management team member scheduled for:  03/02/23 @ 1015 am          Plan: Telephone follow  up appointment with care management team member scheduled for: 03/02/23 @ 1015 am  Irving Shows [facility], BSN RN Care Manager/ Transition of Care [place]/ [facility] Population Health [phone]

## 2023-02-23 NOTE — Patient Instructions (Signed)
Visit Information  Thank you for taking time to visit with me today. Please don't hesitate to contact me if I can be of assistance to you before our next scheduled telephone appointment.  Following are the goals we discussed today:   Goals Addressed             This Visit's Progress    Transition of Care/ pt will have no readmissions within 30 days       Current Barriers:  Home Health services follow up and make sure [facility] made home visit, pt refused for RN CM to check while she was on the phone, states " they already come out to my house and I'll get my daughter to call"  RN CM called after the telephone visit and spoke with Heather Oconnor at [facility] who reports PT will be seeing pt in the home on 02/18/23. Knowledge Deficits related to plan of care for management of hepatocellular cancer (admitted for nausea, vomiting, diarrhea)  Patient denies any nausea, vomiting, diarrhea, states "appetite is fair"   has aide come into her home 7-11 am to assist with ADL's,  daughter Heather Oconnor sees pt daily and provides oversight (daughter works so not always able to speak with her) Patient to follow up with oncology 02/18/23 for treatment (immunotherapy) Patient reports she is walker at all times, states she has all medications and taking as prescribed, pt states she will coordinate with daughter and get post hospital follow up appointment made for primary care provider 02/23/23- pt is asleep and unable to talk, spoke with daughter Heather Oconnor who reports pt had Immunotherapy on 2/14, paracentesis on 2/9 and 2/17 and will continue as needed,  appetite slightly improved, pt is drinking whole fat chocolate milk to incorporate more protein and is able to tolerate this better than Ensure,  [facility] is working with pt.  RNCM Clinical Goal(s):  Patient will work with the Care Management team over the next 30 days to address Transition of Care Barriers: Home Health services verbalize understanding of plan for  management of hepatocellular cancer as evidenced by pt/ cg report, review of EMR and   through collaboration with RN Care manager, provider, and care team.   Interventions: Evaluation of current treatment plan related to  self management and patient's adherence to plan as established by provider Hepatocellular cancer  (Status:  New goal. and Goal on track:  Yes.)  Short Term Goal Evaluation of current treatment plan related to  hepatocellular cancer , ADL IADL limitations self-management and patient's adherence to plan as established by provider. Discussed plans with patient for ongoing care management follow up and provided patient with direct contact information for care management  Reinforced safety precautions,importance of using assistive device at all times Reviewed importance of small, frequent meals Reinforced safety precautions  Patient Goals/Self-Care Activities: Participate in Transition of Care Program/Attend TOC scheduled calls Notify RN Care Manager of TOC call rescheduling needs Take all medications as prescribed Attend all scheduled provider appointments Call pharmacy for medication refills 3-7 days in advance of running out of medications You are on 1500cc daily fluid restriction Please use your walker at all times Continue working with [facility] home health  Use nutritional supplements/ chocolate milk and what you can tolerate  Follow Up Plan:  Telephone follow up appointment with care management team member scheduled for:  03/02/23 @ 1015 am           Our next appointment is by telephone on 03/02/23 at 1015 am  Please call the care guide team at [phone] if you need to cancel or reschedule your appointment.   If you are experiencing a Mental Health or Behavioral Health Crisis or need someone to talk to, please call the Suicide and Crisis Lifeline: 988 call the [place] National Suicide Prevention Lifeline: 435-476-1548 or TTY: (712)420-2249 TTY 323-657-7583) to talk  to a trained counselor call 1-800-273-TALK (toll free, 24 hour hotline) go to [facility] [address] [place] [phone]) call the [facility] Crisis Line: [phone] call 911   Patient verbalizes understanding of instructions and care plan provided today and agrees to view in MyChart. Active MyChart status and patient understanding of how to access instructions and care plan via MyChart confirmed with patient.     Heather Oconnor [facility], BSN RN Care Manager/ Transition of Care / [facility] [phone]

## 2023-02-24 ENCOUNTER — Telehealth: Payer: Self-pay | Admitting: Dietician

## 2023-02-24 NOTE — Telephone Encounter (Signed)
Attempted to reach patient's daughter Wyatt Mage to scheduled remote nutrition consult. Provided my cell# to return call for her mom's follow up nutrition consult.  Gennaro Africa, RDN, LDN Registered Dietitian, [facility] Part Time Remote (Usual office hours: Tuesday-Thursday) Cell: [phone]

## 2023-02-24 NOTE — Addendum Note (Signed)
Encounter addended by: Edward Qualia on: 02/24/2023 10:11 AM  Actions taken: Imaging Exam ended

## 2023-02-26 ENCOUNTER — Other Ambulatory Visit: Payer: Self-pay | Admitting: Oncology

## 2023-02-26 DIAGNOSIS — G893 Neoplasm related pain (acute) (chronic): Secondary | ICD-10-CM

## 2023-02-27 ENCOUNTER — Other Ambulatory Visit: Payer: Self-pay | Admitting: Oncology

## 2023-02-27 DIAGNOSIS — C220 Liver cell carcinoma: Secondary | ICD-10-CM

## 2023-02-28 ENCOUNTER — Encounter: Payer: Self-pay | Admitting: Family Medicine

## 2023-02-28 ENCOUNTER — Encounter: Payer: Self-pay | Admitting: Oncology

## 2023-02-28 NOTE — Telephone Encounter (Unsigned)
 Copied from CRM [id number]. Topic: Clinical - Medical Advice >> Feb 28, 2023  9:35 AM Gildardo Pounds wrote: Reason for CRM: [facility] with Hospice wants to know if if Heather Oconnor will be provider for hospice care. Callback number [phone] ext [phone]

## 2023-03-01 ENCOUNTER — Telehealth: Payer: Self-pay | Admitting: Family Medicine

## 2023-03-01 DIAGNOSIS — R188 Other ascites: Secondary | ICD-10-CM | POA: Diagnosis not present

## 2023-03-01 MED ORDER — LIDOCAINE HCL (PF) 1 % IJ SOLN
10.0000 mL | Freq: Once | INTRAMUSCULAR | Status: AC
  Administered 2023-03-01: 10 mL

## 2023-03-02 ENCOUNTER — Other Ambulatory Visit: Payer: Self-pay | Admitting: *Deleted

## 2023-03-05 NOTE — Telephone Encounter (Signed)
 Copied from CRM [id number]. Topic: General - Deceased Patient >> 03-18-23 11:26 AM Shon Hale wrote: Name of caller: Ermalene Postin [facility] [employer])   Date of death: 03/18/23 at 11:05am  Name of funeral home: [facility] [employer] number of funeral home: [phone]  Provider that needs to sign form: Harlow Mares, FNP  Timeline for signing: [facility] will reach out to office.

## 2023-03-05 DEATH — deceased

## 2023-03-17 ENCOUNTER — Ambulatory Visit: Payer: Medicare HMO

## 2023-03-17 ENCOUNTER — Other Ambulatory Visit: Payer: Medicare HMO

## 2023-03-17 ENCOUNTER — Ambulatory Visit: Payer: Medicare HMO | Admitting: Oncology

## 2023-04-15 ENCOUNTER — Ambulatory Visit: Payer: Medicare HMO

## 2023-04-15 ENCOUNTER — Other Ambulatory Visit: Payer: Medicare HMO

## 2023-04-15 ENCOUNTER — Ambulatory Visit: Payer: Medicare HMO | Admitting: Oncology

## 2023-05-04 ENCOUNTER — Ambulatory Visit: Payer: Medicare HMO | Admitting: Family Medicine

## 2023-07-28 ENCOUNTER — Ambulatory Visit: Payer: Medicare HMO | Admitting: Family Medicine
